# Patient Record
Sex: Male | Born: 1935
Health system: Southern US, Community
[De-identification: ages and names within clinical notes are randomized; demographics above are authoritative.]

## PROBLEM LIST (undated history)

## (undated) ENCOUNTER — Emergency Department (HOSPITAL_BASED_OUTPATIENT_CLINIC_OR_DEPARTMENT_OTHER): Disposition: A | Discharge status: Home / Self Care | Payer: Medicare Other

## (undated) DIAGNOSIS — R338 Other retention of urine: Secondary | ICD-10-CM

## (undated) DIAGNOSIS — H5440 Blindness, one eye, unspecified eye: Secondary | ICD-10-CM

## (undated) DIAGNOSIS — N401 Enlarged prostate with lower urinary tract symptoms: Secondary | ICD-10-CM

## (undated) DIAGNOSIS — N179 Acute kidney failure, unspecified: Secondary | ICD-10-CM

## (undated) DIAGNOSIS — I639 Cerebral infarction, unspecified: Secondary | ICD-10-CM

## (undated) DIAGNOSIS — E7800 Pure hypercholesterolemia, unspecified: Secondary | ICD-10-CM

## (undated) DIAGNOSIS — F329 Major depressive disorder, single episode, unspecified: Secondary | ICD-10-CM

## (undated) DIAGNOSIS — H3530 Unspecified macular degeneration: Secondary | ICD-10-CM

## (undated) DIAGNOSIS — IMO0001 Reserved for inherently not codable concepts without codable children: Secondary | ICD-10-CM

## (undated) DIAGNOSIS — F419 Anxiety disorder, unspecified: Secondary | ICD-10-CM

## (undated) DIAGNOSIS — I714 Abdominal aortic aneurysm, without rupture, unspecified: Secondary | ICD-10-CM

## (undated) DIAGNOSIS — F0390 Unspecified dementia without behavioral disturbance: Secondary | ICD-10-CM

## (undated) DIAGNOSIS — J449 Chronic obstructive pulmonary disease, unspecified: Secondary | ICD-10-CM

## (undated) DIAGNOSIS — N289 Disorder of kidney and ureter, unspecified: Secondary | ICD-10-CM

## (undated) DIAGNOSIS — F32A Depression, unspecified: Secondary | ICD-10-CM

## (undated) HISTORY — PX: NO PAST SURGERIES: SHX2092

---

## 2000-04-13 ENCOUNTER — Encounter: Admission: RE | Admit: 2000-04-13 | Discharge: 2000-04-13 | Payer: Self-pay | Admitting: Orthopedic Surgery

## 2010-06-15 ENCOUNTER — Ambulatory Visit: Payer: Self-pay | Admitting: Diagnostic Radiology

## 2010-06-15 ENCOUNTER — Emergency Department (HOSPITAL_BASED_OUTPATIENT_CLINIC_OR_DEPARTMENT_OTHER): Admission: EM | Admit: 2010-06-15 | Discharge: 2010-06-15 | Payer: Self-pay | Admitting: Emergency Medicine

## 2010-06-18 ENCOUNTER — Emergency Department (HOSPITAL_COMMUNITY): Admission: EM | Admit: 2010-06-18 | Discharge: 2010-06-18 | Payer: Self-pay | Admitting: Family Medicine

## 2012-04-26 ENCOUNTER — Encounter (HOSPITAL_BASED_OUTPATIENT_CLINIC_OR_DEPARTMENT_OTHER): Payer: Self-pay | Admitting: *Deleted

## 2012-04-26 ENCOUNTER — Emergency Department (HOSPITAL_BASED_OUTPATIENT_CLINIC_OR_DEPARTMENT_OTHER): Payer: Medicare Other

## 2012-04-26 ENCOUNTER — Emergency Department (HOSPITAL_BASED_OUTPATIENT_CLINIC_OR_DEPARTMENT_OTHER)
Admission: EM | Admit: 2012-04-26 | Discharge: 2012-04-26 | Disposition: A | Discharge status: Home / Self Care | Payer: Medicare Other | Attending: Emergency Medicine | Admitting: Emergency Medicine

## 2012-04-26 DIAGNOSIS — E7800 Pure hypercholesterolemia, unspecified: Secondary | ICD-10-CM | POA: Insufficient documentation

## 2012-04-26 DIAGNOSIS — F329 Major depressive disorder, single episode, unspecified: Secondary | ICD-10-CM | POA: Insufficient documentation

## 2012-04-26 DIAGNOSIS — R110 Nausea: Secondary | ICD-10-CM | POA: Insufficient documentation

## 2012-04-26 DIAGNOSIS — R51 Headache: Secondary | ICD-10-CM | POA: Insufficient documentation

## 2012-04-26 DIAGNOSIS — F17200 Nicotine dependence, unspecified, uncomplicated: Secondary | ICD-10-CM | POA: Insufficient documentation

## 2012-04-26 DIAGNOSIS — R339 Retention of urine, unspecified: Secondary | ICD-10-CM | POA: Insufficient documentation

## 2012-04-26 DIAGNOSIS — F411 Generalized anxiety disorder: Secondary | ICD-10-CM | POA: Insufficient documentation

## 2012-04-26 DIAGNOSIS — N390 Urinary tract infection, site not specified: Secondary | ICD-10-CM | POA: Insufficient documentation

## 2012-04-26 DIAGNOSIS — F3289 Other specified depressive episodes: Secondary | ICD-10-CM | POA: Insufficient documentation

## 2012-04-26 HISTORY — DX: Major depressive disorder, single episode, unspecified: F32.9

## 2012-04-26 HISTORY — DX: Anxiety disorder, unspecified: F41.9

## 2012-04-26 HISTORY — DX: Depression, unspecified: F32.A

## 2012-04-26 HISTORY — DX: Pure hypercholesterolemia, unspecified: E78.00

## 2012-04-26 LAB — COMPREHENSIVE METABOLIC PANEL
ALT: 14 U/L (ref 0–53)
Alkaline Phosphatase: 95 U/L (ref 39–117)
BUN: 11 mg/dL (ref 6–23)
CO2: 25 mEq/L (ref 19–32)
GFR calc Af Amer: 74 mL/min — ABNORMAL LOW (ref >90)
GFR calc non Af Amer: 64 mL/min — ABNORMAL LOW (ref >90)
Glucose, Bld: 128 mg/dL — ABNORMAL HIGH (ref 70–99)
Potassium: 4.2 mEq/L (ref 3.5–5.1)
Total Protein: 7.7 g/dL (ref 6.0–8.3)

## 2012-04-26 LAB — CBC WITH DIFFERENTIAL/PLATELET
Eosinophils Absolute: 0 10*3/uL (ref 0.0–0.7)
Eosinophils Relative: 0 % (ref 0–5)
Hemoglobin: 15.2 g/dL (ref 13.0–17.0)
Lymphocytes Relative: 8 % — ABNORMAL LOW (ref 12–46)
Lymphs Abs: 1.3 10*3/uL (ref 0.7–4.0)
MCH: 30.8 pg (ref 26.0–34.0)
MCV: 88 fL (ref 78.0–100.0)
Monocytes Relative: 5 % (ref 3–12)
Neutrophils Relative %: 87 % — ABNORMAL HIGH (ref 43–77)
RBC: 4.93 MIL/uL (ref 4.22–5.81)
WBC: 16.8 10*3/uL — ABNORMAL HIGH (ref 4.0–10.5)

## 2012-04-26 LAB — URINALYSIS, ROUTINE W REFLEX MICROSCOPIC
Bilirubin Urine: NEGATIVE
Ketones, ur: NEGATIVE mg/dL
Nitrite: NEGATIVE
Urobilinogen, UA: 0.2 mg/dL (ref 0.0–1.0)

## 2012-04-26 MED ORDER — DEXTROSE 5 % IV SOLN
1.0000 g | Freq: Once | INTRAVENOUS | Status: AC
  Administered 2012-04-26: 1 g via INTRAVENOUS
  Filled 2012-04-26: qty 10

## 2012-04-26 MED ORDER — SODIUM CHLORIDE 0.9 % IV BOLUS (SEPSIS)
1000.0000 mL | Freq: Once | INTRAVENOUS | Status: AC
  Administered 2012-04-26: 1000 mL via INTRAVENOUS

## 2012-04-26 MED ORDER — DEXAMETHASONE SODIUM PHOSPHATE 10 MG/ML IJ SOLN
10.0000 mg | Freq: Once | INTRAMUSCULAR | Status: AC
  Administered 2012-04-26: 10 mg via INTRAVENOUS
  Filled 2012-04-26: qty 1

## 2012-04-26 MED ORDER — DIPHENHYDRAMINE HCL 50 MG/ML IJ SOLN
25.0000 mg | Freq: Once | INTRAMUSCULAR | Status: AC
  Administered 2012-04-26: 25 mg via INTRAVENOUS
  Filled 2012-04-26: qty 1

## 2012-04-26 MED ORDER — METOCLOPRAMIDE HCL 5 MG/ML IJ SOLN
10.0000 mg | Freq: Once | INTRAMUSCULAR | Status: AC
  Administered 2012-04-26: 10 mg via INTRAVENOUS
  Filled 2012-04-26: qty 2

## 2012-04-26 MED ORDER — CEPHALEXIN 500 MG PO CAPS: 500.0000 mg | ORAL_CAPSULE | Freq: Four times a day (QID) | ORAL | Status: AC

## 2012-04-26 MED ORDER — CEPHALEXIN 500 MG PO CAPS: 500.0000 mg | ORAL_CAPSULE | Freq: Four times a day (QID) | ORAL | Status: DC

## 2012-04-26 NOTE — ED Notes (Signed)
Urinary retention since 4 am. States he is able to urinate scanty amounts but it takes a long time to start urinating. Headache. Nausea.

## 2012-04-26 NOTE — ED Provider Notes (Addendum)
History  This chart was scribed for Gwyneth Sprout, MD by Bennett Scrape. This patient was seen in room MH03/MH03 and the patient's care was started at 8:59PM.  CSN: [id number]  Arrival date & time 04/26/12  2002   First MD Initiated Contact with Patient 04/26/12 2059      Chief Complaint  Patient presents with  . Urinary Retention    The history is provided by the patient. No language interpreter was used.    David Duke is a 77 y.o. male who presents to the Emergency Department complaining of 17 hours of gradual onset, non-changing, constant urinary retention described as urinating scanty amounts after straining. He denies prior episodes of urinary retention. He also c/o approximately 15 hours of left-sided HA that radiates into his left neck with associated nausea. He reports that the nausea is worse with eating. He reports taking a 5 mg Valium with no improvement in his symptoms. He confirms prior episodes of HAs that start on the right-side and radiate into the right neck. 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  He denies fever, sore throat, visual disturbance, CP, SOB, abdominal pain, emesis, diarrhea, hematuria, dysuria, back pain, weakness, and rash as associated symptoms.  He also has a h/o depression and anxiety. He is a current everyday smoker but denies alcohol use.  Past Medical History  Diagnosis Date  . High cholesterol   . Depression   . Anxiety     History reviewed. No pertinent past surgical history.  No family history on file.  History  Substance Use Topics  . Smoking status: Current Everyday Smoker -- 0.5 packs/day  . Smokeless tobacco: Not on file  . Alcohol Use: No      Review of Systems  A complete 10 system review of systems was obtained and all systems are negative except as noted in the HPI and PMH.    Allergies    Review of patient's allergies indicates not on file.  Home Medications  No current outpatient prescriptions on file.  Triage Vitals: BP 153/87  Pulse 106  Temp 98.4 F (36.9 C) (Oral)  Resp 20  SpO2 98%  Physical Exam  Nursing note and vitals reviewed. Constitutional: He is oriented to person, place, and time. He appears well-developed and well-nourished. No distress.  HENT:  Head: Normocephalic and atraumatic.  Eyes: Conjunctivae and EOM are normal. Pupils are equal, round, and reactive to light.       No photophobia  Neck: Neck supple. No tracheal deviation present.  Cardiovascular: Normal rate and regular rhythm.   No murmur heard. Pulmonary/Chest: Effort normal and breath sounds normal. No respiratory distress.  Abdominal: Soft. Bowel sounds are normal. There is tenderness (mild suprapubic tenderness). There is no rebound and no guarding.       No CVA tenderness  Musculoskeletal: Normal range of motion. He exhibits no edema.  Neurological: He is alert and oriented to person, place, and time. No cranial nerve deficit.       Normal motor and sensation, normal strength  Skin: Skin is warm and dry.  Psychiatric: He has a normal mood and affect. His behavior is normal.    ED Course  Procedures (including critical care time)  DIAGNOSTIC STUDIES: Oxygen Saturation is 98% on room air, normal by my interpretation.    COORDINATION OF CARE: 9:12PM-Discussed treatment plan which  includes an US of the bladder and pain medications with pt at bedside and pt agreed to plan. Discussed a foley catheter but pt denies that he needs to urinate currently.    Labs Reviewed  URINALYSIS, ROUTINE W REFLEX MICROSCOPIC - Abnormal; Notable for the following:    APPearance CLOUDY (*)     Hgb urine dipstick LARGE (*)     Protein, ur 30 (*)     Leukocytes, UA MODERATE (*)     All other components within normal limits  CBC WITH DIFFERENTIAL - Abnormal; Notable for the following:    WBC 16.8  (*)     Neutrophils Relative 87 (*)     Neutro Abs 14.6 (*)     Lymphocytes Relative 8 (*)     All other components within normal limits  COMPREHENSIVE METABOLIC PANEL - Abnormal; Notable for the following:    Glucose, Bld 128 (*)     GFR calc non Af Amer 64 (*)     GFR calc Af Amer 74 (*)     All other components within normal limits  URINE MICROSCOPIC-ADD ON  URINE CULTURE   Ct Head Wo Contrast  04/26/2012  *RADIOLOGY REPORT*  Clinical Data: Severe headache  CT HEAD WITHOUT CONTRAST  Technique:  Contiguous axial images were obtained from the base of the skull through the vertex without contrast.  Comparison: None.  Findings: Generalized atrophy and mild chronic microvascular ischemia.  No acute infarct.  Negative for hemorrhage or mass. Calvarium is intact.  Paranasal sinuses are clear.  IMPRESSION: Atrophy and mild chronic microvascular ischemia.  No acute abnormality.   Original Report Authenticated By: Camelia Phenes, M.D.      1. UTI (lower urinary tract infection)   2. Urinary retention       MDM   Patient with symptoms consistent with urinary retention and concern for urinary tract infection. Patient has had generalized myalgias and not feeling well for the last 24 hours. Also after the urinary symptoms started he also developed a left-sided headache which is most consistent with migraine headache. Denies any vomiting or fevers. On exam he has suprapubic tenderness but no CVA tenderness. Has a normal neurologic exam without concerning for subarachnoid hemorrhage. CT of the head was negative. UA showed white blood cells and leukocytes consistent with infection in the urine culture was sent. Bedside ultrasound showed a large amount of urine retained within the bladder and a Foley catheter was placed. Patient has a leukocytosis of 16,000 but after headache cocktail and IV fluids she's feeling much better. He was given his first dose of antibiotics IV and was given Rocephin. He would  be sent home on Keflex and urine culture pending.  I personally performed the services described in this documentation, which was scribed in my presence.  The recorded information has been reviewed and considered.      Gwyneth Sprout, MD 04/26/12 2255  Gwyneth Sprout, MD 04/26/12 5409  Gwyneth Sprout, MD 04/26/12 8119

## 2012-04-27 LAB — URINE CULTURE: Colony Count: NO GROWTH

## 2015-03-31 ENCOUNTER — Emergency Department (HOSPITAL_BASED_OUTPATIENT_CLINIC_OR_DEPARTMENT_OTHER)
Admission: EM | Admit: 2015-03-31 | Discharge: 2015-03-31 | Disposition: A | Discharge status: Home / Self Care | Payer: Medicare Other | Attending: Emergency Medicine | Admitting: Emergency Medicine

## 2015-03-31 ENCOUNTER — Encounter (HOSPITAL_BASED_OUTPATIENT_CLINIC_OR_DEPARTMENT_OTHER): Payer: Self-pay | Admitting: Emergency Medicine

## 2015-03-31 DIAGNOSIS — Z8639 Personal history of other endocrine, nutritional and metabolic disease: Secondary | ICD-10-CM | POA: Diagnosis not present

## 2015-03-31 DIAGNOSIS — R339 Retention of urine, unspecified: Secondary | ICD-10-CM | POA: Diagnosis not present

## 2015-03-31 DIAGNOSIS — Z8659 Personal history of other mental and behavioral disorders: Secondary | ICD-10-CM | POA: Diagnosis not present

## 2015-03-31 DIAGNOSIS — R110 Nausea: Secondary | ICD-10-CM | POA: Diagnosis not present

## 2015-03-31 DIAGNOSIS — Z720 Tobacco use: Secondary | ICD-10-CM | POA: Diagnosis not present

## 2015-03-31 LAB — BASIC METABOLIC PANEL
Anion gap: 9 (ref 5–15)
BUN: 17 mg/dL (ref 6–20)
CHLORIDE: 103 mmol/L (ref 101–111)
CO2: 23 mmol/L (ref 22–32)
Calcium: 8.6 mg/dL — ABNORMAL LOW (ref 8.9–10.3)
Creatinine, Ser: 1.29 mg/dL — ABNORMAL HIGH (ref 0.61–1.24)
GFR calc non Af Amer: 51 mL/min — ABNORMAL LOW (ref >60)
GFR, EST AFRICAN AMERICAN: 60 mL/min — AB (ref >60)
GLUCOSE: 104 mg/dL — AB (ref 65–99)
POTASSIUM: 3.7 mmol/L (ref 3.5–5.1)
Sodium: 135 mmol/L (ref 135–145)

## 2015-03-31 LAB — URINALYSIS, ROUTINE W REFLEX MICROSCOPIC
BILIRUBIN URINE: NEGATIVE
GLUCOSE, UA: NEGATIVE mg/dL
Ketones, ur: NEGATIVE mg/dL
Leukocytes, UA: NEGATIVE
Nitrite: NEGATIVE
PH: 5.5 (ref 5.0–8.0)
Protein, ur: NEGATIVE mg/dL
SPECIFIC GRAVITY, URINE: 1.007 (ref 1.005–1.030)
Urobilinogen, UA: 0.2 mg/dL (ref 0.0–1.0)

## 2015-03-31 LAB — URINE MICROSCOPIC-ADD ON

## 2015-03-31 NOTE — ED Provider Notes (Signed)
CSN: [id number]     Arrival date & time 03/31/15  1747 History  This chart was scribed for Eber Hong, MD by Abel Presto, ED Scribe. This patient was seen in room MH07/MH07 and the patient's care was started at 7:34 PM.       Chief Complaint  Patient presents with  . Nausea  . Urinary Retention     HPI HPI Comments: David Duke is a 80 y.o. male with PMHx of HLD who presents to the Emergency Department complaining of urinary retention with onset this morning with some improvement by this evening. Wife reports "it's a small stream." He reports associated intermittent nausea for last several days. Pt tried drinking 3 bottles of water and cranberry juice with no improvement in symptoms. He reports some abdominal fullness but denies pain. Pt was placed on Levaquin 3 weeks ago for prostate enlargement and possible infection. Pt reports similar complaint in past in 2012. Pt takes Rapaflo. Pt denies fever and vomiting.  Past Medical History  Diagnosis Date  . High cholesterol   . Depression   . Anxiety    History reviewed. No pertinent past surgical history. History reviewed. No pertinent family history. History  Substance Use Topics  . Smoking status: Current Every Day Smoker -- 0.50 packs/day  . Smokeless tobacco: Not on file  . Alcohol Use: No    Review of Systems  Constitutional: Negative for fever.  Gastrointestinal: Positive for nausea. Negative for vomiting.  Genitourinary: Positive for decreased urine volume and difficulty urinating.  All other systems reviewed and are negative.     Allergies  Review of patient's allergies indicates no known allergies.  Home Medications   Prior to Admission medications   Not on File   BP 136/79 mmHg  Pulse 77  Temp(Src) 99 F (37.2 C) (Oral)  Resp 16  Ht  (1.727 m)  Wt 149 lb (67.586 kg)  BMI 22.66 kg/m2  SpO2 97% Physical Exam  Constitutional: He is oriented to person, place, and time. He appears  well-developed and well-nourished.  HENT:  Head: Normocephalic and atraumatic.  Eyes: Conjunctivae are normal. Right eye exhibits no discharge. Left eye exhibits no discharge.  Pulmonary/Chest: Effort normal. No respiratory distress.  Abdominal:  Fullness in suprapubic area.  Neurological: He is alert and oriented to person, place, and time. Coordination normal.  Skin: Skin is warm and dry. No rash noted. He is not diaphoretic. No erythema.  Psychiatric: He has a normal mood and affect.  Nursing note and vitals reviewed.   ED Course  Procedures (including critical care time) DIAGNOSTIC STUDIES: Oxygen Saturation is 100% on room air, normal by my interpretation.    COORDINATION OF CARE: 7:41 PM Discussed treatment plan with patient at beside, the patient agrees with the plan and has no further questions at this time.   Labs Review Labs Reviewed  URINALYSIS, ROUTINE W REFLEX MICROSCOPIC (NOT AT [facility]) - Abnormal; Notable for the following:    Hgb urine dipstick TRACE (*)    All other components within normal limits  BASIC METABOLIC PANEL - Abnormal; Notable for the following:    Glucose, Bld 104 (*)    Creatinine, Ser 1.29 (*)    Calcium 8.6 (*)    GFR calc non Af Amer 51 (*)    GFR calc Af Amer 60 (*)    All other components within normal limits  URINE MICROSCOPIC-ADD ON    Imaging Review No results found.  MDM   Final diagnoses:  Urinary retention     The patient has no urinary tract infection, vital signs are normal, he has been able to pass urine spontaneously, no need for Foley placement. He now states that he has been using Alka-Seltzer plus which contains several different medications which could cause urinary retention. At this time the risk of placing a Foley and causing infection is greater than the benefit. He will be discharged home to follow up with urology and instructed not to use that medication anymore.  I personally performed the services described in  this documentation, which was scribed in my presence. The recorded information has been reviewed and is accurate.       Eber Hong, MD 03/31/15 2114

## 2015-03-31 NOTE — ED Notes (Signed)
Patient states that he is having some urinary retention, reports that he has not had a good void since this am since 7 am. The patient reports that since then he has had little dribbles of urine. He reports no "pain" but reports that he feels tired and is nauseated.

## 2015-11-07 DIAGNOSIS — I639 Cerebral infarction, unspecified: Secondary | ICD-10-CM

## 2015-11-07 HISTORY — DX: Cerebral infarction, unspecified: I63.9

## 2015-11-22 ENCOUNTER — Inpatient Hospital Stay (HOSPITAL_COMMUNITY)
Admission: EM | Admit: 2015-11-22 | Discharge: 2015-11-28 | DRG: 065 | Disposition: A | Discharge status: Rehabilitation Facility | Payer: Medicare Other | Attending: Neurology | Admitting: Neurology

## 2015-11-22 ENCOUNTER — Encounter (HOSPITAL_COMMUNITY): Payer: Self-pay | Admitting: Emergency Medicine

## 2015-11-22 ENCOUNTER — Emergency Department (HOSPITAL_COMMUNITY): Payer: Medicare Other

## 2015-11-22 DIAGNOSIS — J449 Chronic obstructive pulmonary disease, unspecified: Secondary | ICD-10-CM | POA: Diagnosis present

## 2015-11-22 DIAGNOSIS — H53461 Homonymous bilateral field defects, right side: Secondary | ICD-10-CM | POA: Diagnosis present

## 2015-11-22 DIAGNOSIS — H3530 Unspecified macular degeneration: Secondary | ICD-10-CM | POA: Diagnosis present

## 2015-11-22 DIAGNOSIS — J329 Chronic sinusitis, unspecified: Secondary | ICD-10-CM | POA: Diagnosis not present

## 2015-11-22 DIAGNOSIS — I6789 Other cerebrovascular disease: Secondary | ICD-10-CM | POA: Diagnosis not present

## 2015-11-22 DIAGNOSIS — H5441 Blindness, right eye, normal vision left eye: Secondary | ICD-10-CM | POA: Diagnosis present

## 2015-11-22 DIAGNOSIS — F4323 Adjustment disorder with mixed anxiety and depressed mood: Secondary | ICD-10-CM | POA: Diagnosis not present

## 2015-11-22 DIAGNOSIS — F17210 Nicotine dependence, cigarettes, uncomplicated: Secondary | ICD-10-CM | POA: Diagnosis present

## 2015-11-22 DIAGNOSIS — N179 Acute kidney failure, unspecified: Secondary | ICD-10-CM | POA: Diagnosis present

## 2015-11-22 DIAGNOSIS — H538 Other visual disturbances: Secondary | ICD-10-CM | POA: Diagnosis present

## 2015-11-22 DIAGNOSIS — D649 Anemia, unspecified: Secondary | ICD-10-CM | POA: Insufficient documentation

## 2015-11-22 DIAGNOSIS — E785 Hyperlipidemia, unspecified: Secondary | ICD-10-CM | POA: Diagnosis present

## 2015-11-22 DIAGNOSIS — J0110 Acute frontal sinusitis, unspecified: Secondary | ICD-10-CM | POA: Diagnosis not present

## 2015-11-22 DIAGNOSIS — G9340 Encephalopathy, unspecified: Secondary | ICD-10-CM | POA: Diagnosis present

## 2015-11-22 DIAGNOSIS — R4701 Aphasia: Secondary | ICD-10-CM

## 2015-11-22 DIAGNOSIS — E871 Hypo-osmolality and hyponatremia: Secondary | ICD-10-CM | POA: Diagnosis present

## 2015-11-22 DIAGNOSIS — R7303 Prediabetes: Secondary | ICD-10-CM | POA: Diagnosis present

## 2015-11-22 DIAGNOSIS — E876 Hypokalemia: Secondary | ICD-10-CM | POA: Diagnosis not present

## 2015-11-22 DIAGNOSIS — E860 Dehydration: Secondary | ICD-10-CM | POA: Diagnosis present

## 2015-11-22 DIAGNOSIS — F419 Anxiety disorder, unspecified: Secondary | ICD-10-CM | POA: Diagnosis present

## 2015-11-22 DIAGNOSIS — F40240 Claustrophobia: Secondary | ICD-10-CM | POA: Diagnosis present

## 2015-11-22 DIAGNOSIS — I69919 Unspecified symptoms and signs involving cognitive functions following unspecified cerebrovascular disease: Secondary | ICD-10-CM | POA: Diagnosis not present

## 2015-11-22 DIAGNOSIS — F329 Major depressive disorder, single episode, unspecified: Secondary | ICD-10-CM | POA: Diagnosis present

## 2015-11-22 DIAGNOSIS — I639 Cerebral infarction, unspecified: Secondary | ICD-10-CM | POA: Diagnosis not present

## 2015-11-22 DIAGNOSIS — I10 Essential (primary) hypertension: Secondary | ICD-10-CM | POA: Diagnosis present

## 2015-11-22 DIAGNOSIS — I611 Nontraumatic intracerebral hemorrhage in hemisphere, cortical: Secondary | ICD-10-CM | POA: Diagnosis present

## 2015-11-22 DIAGNOSIS — I6529 Occlusion and stenosis of unspecified carotid artery: Secondary | ICD-10-CM | POA: Diagnosis present

## 2015-11-22 DIAGNOSIS — I610 Nontraumatic intracerebral hemorrhage in hemisphere, subcortical: Secondary | ICD-10-CM | POA: Diagnosis not present

## 2015-11-22 DIAGNOSIS — I629 Nontraumatic intracranial hemorrhage, unspecified: Secondary | ICD-10-CM

## 2015-11-22 DIAGNOSIS — I619 Nontraumatic intracerebral hemorrhage, unspecified: Secondary | ICD-10-CM

## 2015-11-22 DIAGNOSIS — R339 Retention of urine, unspecified: Secondary | ICD-10-CM | POA: Diagnosis not present

## 2015-11-22 DIAGNOSIS — R062 Wheezing: Secondary | ICD-10-CM

## 2015-11-22 DIAGNOSIS — R509 Fever, unspecified: Secondary | ICD-10-CM

## 2015-11-22 DIAGNOSIS — I69398 Other sequelae of cerebral infarction: Secondary | ICD-10-CM | POA: Diagnosis not present

## 2015-11-22 DIAGNOSIS — I69993 Ataxia following unspecified cerebrovascular disease: Secondary | ICD-10-CM | POA: Diagnosis not present

## 2015-11-22 DIAGNOSIS — N400 Enlarged prostate without lower urinary tract symptoms: Secondary | ICD-10-CM | POA: Diagnosis not present

## 2015-11-22 DIAGNOSIS — F32A Depression, unspecified: Secondary | ICD-10-CM | POA: Diagnosis present

## 2015-11-22 DIAGNOSIS — D72829 Elevated white blood cell count, unspecified: Secondary | ICD-10-CM

## 2015-11-22 DIAGNOSIS — I674 Hypertensive encephalopathy: Secondary | ICD-10-CM | POA: Diagnosis present

## 2015-11-22 HISTORY — DX: Unspecified macular degeneration: H35.30

## 2015-11-22 HISTORY — DX: Chronic obstructive pulmonary disease, unspecified: J44.9

## 2015-11-22 HISTORY — DX: Blindness, one eye, unspecified eye: H54.40

## 2015-11-22 HISTORY — DX: Reserved for inherently not codable concepts without codable children: IMO0001

## 2015-11-22 LAB — LIPID PANEL
Cholesterol: 213 mg/dL — ABNORMAL HIGH (ref 0–200)
HDL: 53 mg/dL (ref >40)
LDL CALC: 122 mg/dL — AB (ref 0–99)
TRIGLYCERIDES: 188 mg/dL — AB (ref <150)
Total CHOL/HDL Ratio: 4 RATIO
VLDL: 38 mg/dL (ref 0–40)

## 2015-11-22 LAB — I-STAT CHEM 8, ED
BUN: 13 mg/dL (ref 6–20)
Calcium, Ion: 1.07 mmol/L — ABNORMAL LOW (ref 1.13–1.30)
Chloride: 102 mmol/L (ref 101–111)
Creatinine, Ser: 1.3 mg/dL — ABNORMAL HIGH (ref 0.61–1.24)
Glucose, Bld: 111 mg/dL — ABNORMAL HIGH (ref 65–99)
HEMATOCRIT: 50 % (ref 39.0–52.0)
Hemoglobin: 17 g/dL (ref 13.0–17.0)
Potassium: 4.2 mmol/L (ref 3.5–5.1)
SODIUM: 137 mmol/L (ref 135–145)
TCO2: 24 mmol/L (ref 0–100)

## 2015-11-22 LAB — DIFFERENTIAL
Basophils Absolute: 0 10*3/uL (ref 0.0–0.1)
Basophils Relative: 0 %
EOS ABS: 0.1 10*3/uL (ref 0.0–0.7)
Eosinophils Relative: 1 %
Lymphocytes Relative: 6 %
Lymphs Abs: 0.8 10*3/uL (ref 0.7–4.0)
MONO ABS: 0.6 10*3/uL (ref 0.1–1.0)
Monocytes Relative: 5 %
NEUTROS PCT: 88 %
Neutro Abs: 11.3 10*3/uL — ABNORMAL HIGH (ref 1.7–7.7)
Smear Review: ADEQUATE

## 2015-11-22 LAB — RAPID URINE DRUG SCREEN, HOSP PERFORMED
Amphetamines: NOT DETECTED
Barbiturates: NOT DETECTED
Benzodiazepines: POSITIVE — AB
COCAINE: NOT DETECTED
OPIATES: NOT DETECTED
TETRAHYDROCANNABINOL: NOT DETECTED

## 2015-11-22 LAB — URINALYSIS, ROUTINE W REFLEX MICROSCOPIC
Bilirubin Urine: NEGATIVE
GLUCOSE, UA: NEGATIVE mg/dL
KETONES UR: NEGATIVE mg/dL
LEUKOCYTES UA: NEGATIVE
Nitrite: NEGATIVE
PH: 6 (ref 5.0–8.0)
Protein, ur: NEGATIVE mg/dL
Specific Gravity, Urine: 1.012 (ref 1.005–1.030)

## 2015-11-22 LAB — CBC
HCT: 47.1 % (ref 39.0–52.0)
Hemoglobin: 15.5 g/dL (ref 13.0–17.0)
MCH: 29.9 pg (ref 26.0–34.0)
MCHC: 32.9 g/dL (ref 30.0–36.0)
MCV: 90.8 fL (ref 78.0–100.0)
PLATELETS: ADEQUATE 10*3/uL (ref 150–400)
RBC: 5.19 MIL/uL (ref 4.22–5.81)
RDW: 13.2 % (ref 11.5–15.5)
WBC: 12.8 10*3/uL — ABNORMAL HIGH (ref 4.0–10.5)

## 2015-11-22 LAB — COMPREHENSIVE METABOLIC PANEL
ALBUMIN: 3.9 g/dL (ref 3.5–5.0)
ALT: 15 U/L — ABNORMAL LOW (ref 17–63)
ANION GAP: 12 (ref 5–15)
AST: 22 U/L (ref 15–41)
Alkaline Phosphatase: 90 U/L (ref 38–126)
BUN: 11 mg/dL (ref 6–20)
CO2: 21 mmol/L — ABNORMAL LOW (ref 22–32)
Calcium: 9.1 mg/dL (ref 8.9–10.3)
Chloride: 103 mmol/L (ref 101–111)
Creatinine, Ser: 1.33 mg/dL — ABNORMAL HIGH (ref 0.61–1.24)
GFR calc Af Amer: 57 mL/min — ABNORMAL LOW (ref >60)
GFR calc non Af Amer: 50 mL/min — ABNORMAL LOW (ref >60)
GLUCOSE: 118 mg/dL — AB (ref 65–99)
POTASSIUM: 4.3 mmol/L (ref 3.5–5.1)
SODIUM: 136 mmol/L (ref 135–145)
TOTAL PROTEIN: 7.1 g/dL (ref 6.5–8.1)
Total Bilirubin: 0.5 mg/dL (ref 0.3–1.2)

## 2015-11-22 LAB — PROTIME-INR
INR: 0.98 (ref 0.00–1.49)
PROTHROMBIN TIME: 13.2 s (ref 11.6–15.2)

## 2015-11-22 LAB — I-STAT TROPONIN, ED: Troponin i, poc: 0.01 ng/mL (ref 0.00–0.08)

## 2015-11-22 LAB — GLUCOSE, CAPILLARY: Glucose-Capillary: 117 mg/dL — ABNORMAL HIGH (ref 65–99)

## 2015-11-22 LAB — ETHANOL

## 2015-11-22 LAB — APTT: aPTT: 26 seconds (ref 24–37)

## 2015-11-22 LAB — URINE MICROSCOPIC-ADD ON

## 2015-11-22 MED ORDER — ACETAMINOPHEN 325 MG PO TABS
650.0000 mg | ORAL_TABLET | ORAL | Status: DC | PRN
  Administered 2015-11-22 – 2015-11-23 (×4): 650 mg via ORAL
  Filled 2015-11-22 (×4): qty 2

## 2015-11-22 MED ORDER — STROKE: EARLY STAGES OF RECOVERY BOOK
Freq: Once | Status: DC
  Filled 2015-11-22: qty 1

## 2015-11-22 MED ORDER — LABETALOL HCL 5 MG/ML IV SOLN
10.0000 mg | INTRAVENOUS | Status: DC | PRN
  Administered 2015-11-22 – 2015-11-25 (×7): 10 mg via INTRAVENOUS
  Filled 2015-11-22 (×5): qty 4

## 2015-11-22 MED ORDER — ACETAMINOPHEN 650 MG RE SUPP: 650.0000 mg | RECTAL | Status: DC | PRN

## 2015-11-22 MED ORDER — NICARDIPINE HCL IN NACL 20-0.86 MG/200ML-% IV SOLN: 3.0000 mg/h | Freq: Once | INTRAVENOUS | Status: DC

## 2015-11-22 MED ORDER — SENNOSIDES-DOCUSATE SODIUM 8.6-50 MG PO TABS
1.0000 | ORAL_TABLET | Freq: Two times a day (BID) | ORAL | Status: DC
Start: 2015-11-22 — End: 2015-11-28
  Administered 2015-11-24 – 2015-11-28 (×6): 1 via ORAL
  Filled 2015-11-22 (×9): qty 1

## 2015-11-22 MED ORDER — PANTOPRAZOLE SODIUM 40 MG IV SOLR
40.0000 mg | Freq: Every day | INTRAVENOUS | Status: DC
  Administered 2015-11-22: 40 mg via INTRAVENOUS
  Filled 2015-11-22: qty 40

## 2015-11-22 NOTE — ED Provider Notes (Signed)
CSN: [id number]     Arrival date & time 11/22/15  1717 History   First MD Initiated Contact with Patient 11/22/15 1728     Chief Complaint  Patient presents with  . Code Stroke      HPI  81 y.o. 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 Patient reports that earlier this afternoon he developed some blurry vision in his left eye. He denies any weakness or speech disturbances, numbness, protruding posterior or gait problems. He went to his ophthalmologist's abdominal office to get an exam and was concerned for stroke, so sent the patient here for evaluation. Patient's wife additionally reports that the patient seemed confused earlier in the day today. He denies any recent illnesses including fever, headache, chest pain, shortness of breath, abdominal pain, nausea, vomiting, diarrhea, urinary frequency. No prior history of stroke. He is not on blood thinners. No recent head trauma.   Early this afternoon developed blurry vision in the L eye Past Medical History  Diagnosis Date  . High cholesterol   . Depression   . Anxiety    History reviewed. No pertinent past surgical history. No family history on file. Social History  Substance Use Topics  . Smoking status: Current Every Day Smoker -- 0.50 packs/day  . Smokeless tobacco: None  . Alcohol Use: No    Review of Systems  Constitutional: Negative for fever, chills, activity change and appetite change.  HENT: Negative for congestion, facial swelling, rhinorrhea and sore throat.   Eyes: Positive for visual disturbance.  Respiratory: Negative for cough, shortness of breath and wheezing.   Cardiovascular: Negative for chest pain, palpitations and leg swelling.  Gastrointestinal: Negative for nausea, vomiting, abdominal pain, diarrhea, constipation and blood in stool.  Genitourinary: Negative for dysuria, frequency, hematuria, flank  pain and difficulty urinating.  Musculoskeletal: Negative for myalgias, back pain, joint swelling, arthralgias, neck pain and neck stiffness.  Skin: Negative for rash.  Neurological: Negative for dizziness, tremors, seizures, syncope, facial asymmetry, speech difficulty, weakness, light-headedness, numbness and headaches.  Psychiatric/Behavioral: Negative for behavioral problems, confusion and agitation.      Allergies  Review of patient's allergies indicates no known allergies.  Home Medications   Prior to Admission medications   Not on File   BP 145/98 mmHg  Pulse 87  Temp(Src) 99 F (37.2 C) (Oral)  Resp 16  SpO2 95% Physical Exam  Constitutional: He is oriented to person, place, and time. He appears well-developed and well-nourished. No distress.  HENT:  Head: Normocephalic and atraumatic.  Right Ear: External ear normal.  Left Ear: External ear normal.  Nose: Nose normal.  Mouth/Throat: Oropharynx is clear and moist. No oropharyngeal exudate.  Eyes: Conjunctivae are normal. Pupils are equal, round, and reactive to light. Right eye exhibits no discharge. Left eye exhibits no discharge. No scleral icterus.  R hemianopsia  Neck: Normal range of motion. Neck supple. No tracheal deviation present.  Cardiovascular: Normal rate, regular rhythm and normal heart sounds.  Exam reveals no gallop and no friction rub.   No murmur heard. Pulmonary/Chest: Effort normal and breath sounds normal. No respiratory distress. He has no wheezes. He has no rales.  Abdominal: Soft. Bowel sounds are normal. He exhibits no distension and no mass. There is no tenderness. There is no rebound and no guarding.  Musculoskeletal: Normal range of motion. He exhibits no edema or tenderness.  Neurological: He is alert and oriented to  person, place, and time. He exhibits normal muscle tone.  Face symmetric, tongue midline. 5/5 strength in the proximal and distal upper and lower extremities bilaterally, with  intact sensation to light touch. Normal finger to nose, and rapid alternating movements. Normal speech.    Skin: Skin is warm and dry. No rash noted. He is not diaphoretic.  Psychiatric: He has a normal mood and affect. His behavior is normal. Judgment and thought content normal.    ED Course  Procedures (including critical care time) Labs Review Labs Reviewed  CBC - Abnormal; Notable for the following:    WBC 12.8 (*)    All other components within normal limits  DIFFERENTIAL - Abnormal; Notable for the following:    Neutro Abs 11.3 (*)    All other components within normal limits  COMPREHENSIVE METABOLIC PANEL - Abnormal; Notable for the following:    CO2 21 (*)    Glucose, Bld 118 (*)    Creatinine, Ser 1.33 (*)    ALT 15 (*)    GFR calc non Af Amer 50 (*)    GFR calc Af Amer 57 (*)    All other components within normal limits  I-STAT CHEM 8, ED - Abnormal; Notable for the following:    Creatinine, Ser 1.30 (*)    Glucose, Bld 111 (*)    Calcium, Ion 1.07 (*)    All other components within normal limits  PROTIME-INR  APTT  ETHANOL  URINE RAPID DRUG SCREEN, HOSP PERFORMED  URINALYSIS, ROUTINE W REFLEX MICROSCOPIC (NOT AT [facility])  LIPID PANEL  I-STAT TROPOININ, ED    Imaging Review Ct Head Wo Contrast  11/22/2015  CLINICAL DATA:  Stroke, vision changes on the left and neck pain. EXAM: CT HEAD WITHOUT CONTRAST TECHNIQUE: Contiguous axial images were obtained from the base of the skull through the vertex without intravenous contrast. COMPARISON:  Head CT dated 04/26/2012. FINDINGS: Brain: Acute hemorrhage within the left lower parietal-occipital lobe, measuring 5.6 x 2.9 cm, with surrounding edema causing effacement of adjacent sulci and mass effect on the posterior horn of the left lateral ventricle. Hemorrhage extends into the posterior horn of the left lateral ventricle. Small amount of additional subdural hemorrhage is seen along the underlying tentorium and along the  adjacent posterior falx. No midline shift or herniation. Mild chronic small vessel ischemic change noted within the deep periventricular white matter regions bilaterally. No hydrocephalus. Vascular: No hyperdense vessel or unexpected calcification. Skull: Negative for fracture or focal lesion. Sinuses/Orbits: No acute findings. Other: None. IMPRESSION: 1. Acute intraparenchymal hemorrhage within the lower left parietal-occipital lobe, measuring 5.6 x 2.9 cm, with surrounding edema causing slight mass effect without midline shift or herniation. Hemorrhage extends into the adjacent posterior horn of the left lateral ventricle. Additional thin hemorrhage along the adjacent posterior falx and underlying tentorium. 2. Mild chronic small vessel ischemic change within the periventricular white matter. Critical Value/emergent results were called by telephone at the time of interpretation on 11/22/2015 at 5:36 pm to Dr. Azalia BilisKEVIN CAMPOS , who verbally acknowledged these results. Electronically Signed   By: Bary RichardStan  Maynard M.D.   On: 11/22/2015 17:36   I have personally reviewed and evaluated these images and lab results as part of my medical decision-making.   EKG Interpretation None      MDM   Final diagnoses:  Left-sided nontraumatic intracerebral hemorrhage, unspecified cerebral location [facility](HCC)    Code stroke activated upon patient arrival as patient was last seen normal at 11:30 AM. Airway intact  and patient awake and alert and moving all 4 extremities.  CT head obtained showing a large left parieto-occipital hemorrhage. Neurology at bedside. Will initiate a Cardene drip to maintain blood pressure between 110-140 systolic. Remainder of neurologic exam as above. Plan to admit to the neurology ICU for further management. Patient is not on any blood thinners. He stable for transfer to the ICU.  Jenifer Ernestina Penna, MD 11/22/15 1817  Azalia Bilis, MD 11/22/15 [phone]

## 2015-11-22 NOTE — ED Notes (Signed)
Patient comes from MD office. Per family about 1130 patient started having acute vision changes. Patient wife also had some mild confusion. Patient alert and oriented x4 on arrival. Patient able to move all extremities. Per family patient speech normal.

## 2015-11-22 NOTE — H&P (Signed)
Requesting Physician: Dr.  Patria Mane    Reason for consultation: Stroke code  HPI:                                                                                                                                         ANUBIS FUNDORA is an 81 y.o. 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 Patient reports that earlier this afternoon he developed some blurry vision in his left eye. He denies any weakness or speech disturbances, numbness, protruding posterior or gait problems. He went to his ophthalmologist's abdominal office to get an exam and was concerned for stroke, so sent the patient here for evaluation. Patient's wife additionally reports that the patient seemed confused earlier in the day today. He denies any recent illnesses including fever, headache, chest pain, shortness of breath, abdominal pain, nausea, vomiting, diarrhea, urinary frequency. No prior history of stroke. He is not on blood thinners. No recent head trauma.    Date last known well:  11/22/2015 Time last known well:  Some time this morning, time of onset unknown tPA Given: No: Intracerebral hemorrhage ICH score: 1 Stroke Risk Factors - hypertension  Past Medical History: Past Medical History  Diagnosis Date  . High cholesterol   . Depression   . Anxiety     History reviewed. No pertinent past surgical history.  Family History: No family history on file.  Social History:   reports that he has been smoking.  He does not have any smokeless tobacco history on file. He reports that he does not drink alcohol or use illicit drugs.  Allergies:  No Known Allergies   Medications:                                                                                                                         Current facility-administered medications:  .   stroke: mapping our early stages of recovery book, ,  Does not apply, Once, Paras Kreider Daniel Nones, MD .  acetaminophen (TYLENOL) tablet 650 mg, 650 mg, Oral, Q4H PRN **OR** acetaminophen (TYLENOL) suppository 650 mg, 650 mg, Rectal, Q4H PRN, Marinna Blane Daniel Nones, MD .  labetalol (NORMODYNE,TRANDATE) injection 10 mg, 10 mg, Intravenous, Q10 min PRN, Jamesa Tedrick Daniel Nones, MD, 10 mg at 11/22/15 1746 .  nicardipine (  CARDENE)  in 0.86% saline IV infusion (0.1 mg/ml), 3-15 mg/hr, Intravenous, Once, Jenifer Brunno Irick, MD .  pantoprazole (PROTONIX) injection 40 mg, 40 mg, Intravenous, QHS, Ellesse Antenucci Daniel Nones, MD .  senna-docusate (Senokot-S) tablet 1 tablet, 1 tablet, Oral, BID, Litzy Dicker Daniel Nones, MD No current outpatient prescriptions on file.   ROS:                                                                                                                                       History obtained from the patient  General ROS: negative for - chills, fatigue, fever, night sweats, weight gain or weight loss Psychological ROS: negative for - behavioral disorder, hallucinations, memory difficulties, mood swings or suicidal ideation Ophthalmic ROS: negative for - blurry vision, double vision, eye pain or loss of vision ENT ROS: negative for - epistaxis, nasal discharge, oral lesions, sore throat, tinnitus or vertigo Allergy and Immunology ROS: negative for - hives or itchy/watery eyes Hematological and Lymphatic ROS: negative for - bleeding problems, bruising or swollen lymph nodes Endocrine ROS: negative for - galactorrhea, hair pattern changes, polydipsia/polyuria or temperature intolerance Respiratory ROS: negative for - cough, hemoptysis, shortness of breath or wheezing Cardiovascular ROS: negative for - chest pain, dyspnea on exertion, edema or irregular heartbeat Gastrointestinal ROS: negative for - abdominal pain, diarrhea, hematemesis, nausea/vomiting or stool incontinence Genito-Urinary ROS:  negative for - dysuria, hematuria, incontinence or urinary frequency/urgency Musculoskeletal ROS: negative for - joint swelling or muscular weakness Neurological ROS: as noted in HPI Dermatological ROS: negative for rash and skin lesion changes  Neurologic Examination:                                                                                                    Today's Vitals   11/22/15 1801 11/22/15 1803  BP: 145/98   Pulse: 87   Temp: 99 F (37.2 C)   TempSrc: Oral   Resp: 16   SpO2: 95%   PainSc:  8     Evaluation of higher integrative functions including: Level of alertness: Alert,  Oriented to time, place and person speech: fluent, no evidence of dysarthria or aphasia noted.  Test the following cranial nerves: 3-12 grossly intact, he has chronic vision loss in the right eye, right hemianopia through the left eye.  Motor examination: Normal tone, bulk, full 5/5 motor strength in all 4 extremities Examination of sensation : Normal and symmetric sensation to pinprick in all 4 extremities and on face Examination  of deep tendon reflexes: 2+, normal and symmetric in all extremities, normal plantars bilaterally Test coordination: Normal finger nose testing, with no evidence of limb appendicular ataxia or abnormal involuntary movements or tremors noted.       Lab Results: Basic Metabolic Panel:  Recent Labs Lab 11/22/15 1720 11/22/15 1732  NA 136 137  K 4.3 4.2  CL 103 102  CO2 21*  --   GLUCOSE 118* 111*  BUN 11 13  CREATININE 1.33* 1.30*  CALCIUM 9.1  --     Liver Function Tests:  Recent Labs Lab 11/22/15 1720  AST 22  ALT 15*  ALKPHOS 90  BILITOT 0.5  PROT 7.1  ALBUMIN 3.9   No results for input(s): LIPASE, AMYLASE in the last 168 hours. No results for input(s): AMMONIA in the last 168 hours.  CBC:  Recent Labs Lab 11/22/15 1720 11/22/15 1732  WBC PENDING  --   NEUTROABS PENDING  --   HGB 15.5 17.0  HCT 47.1 50.0  MCV 90.8  --   PLT  PENDING  --     Cardiac Enzymes: No results for input(s): CKTOTAL, CKMB, CKMBINDEX, TROPONINI in the last 168 hours.  Lipid Panel: No results for input(s): CHOL, TRIG, HDL, CHOLHDL, VLDL, LDLCALC in the last 168 hours.  CBG: No results for input(s): GLUCAP in the last 168 hours.  Microbiology: Results for orders placed or performed during the hospital encounter of 04/26/12  Urine culture     Status: None   Collection Time: 04/26/12  8:56 PM  Result Value Ref Range Status   Specimen Description URINE, CLEAN CATCH  Final   Special Requests NONE  Final   Culture  Setup Time 04/27/2012 01:31  Final   Colony Count NO GROWTH  Final   Culture NO GROWTH  Final   Report Status 04/27/2012 FINAL  Final     Imaging: Ct Head Wo Contrast  11/22/2015  CLINICAL DATA:  Stroke, vision changes on the left and neck pain. EXAM: CT HEAD WITHOUT CONTRAST TECHNIQUE: Contiguous axial images were obtained from the base of the skull through the vertex without intravenous contrast. COMPARISON:  Head CT dated 04/26/2012. FINDINGS: Brain: Acute hemorrhage within the left lower parietal-occipital lobe, measuring 5.6 x 2.9 cm, with surrounding edema causing effacement of adjacent sulci and mass effect on the posterior horn of the left lateral ventricle. Hemorrhage extends into the posterior horn of the left lateral ventricle. Small amount of additional subdural hemorrhage is seen along the underlying tentorium and along the adjacent posterior falx. No midline shift or herniation. Mild chronic small vessel ischemic change noted within the deep periventricular white matter regions bilaterally. No hydrocephalus. Vascular: No hyperdense vessel or unexpected calcification. Skull: Negative for fracture or focal lesion. Sinuses/Orbits: No acute findings. Other: None. IMPRESSION: 1. Acute intraparenchymal hemorrhage within the lower left parietal-occipital lobe, measuring 5.6 x 2.9 cm, with surrounding edema causing slight mass  effect without midline shift or herniation. Hemorrhage extends into the adjacent posterior horn of the left lateral ventricle. Additional thin hemorrhage along the adjacent posterior falx and underlying tentorium. 2. Mild chronic small vessel ischemic change within the periventricular white matter. Critical Value/emergent results were called by telephone at the time of interpretation on 11/22/2015 at 5:36 pm to Dr. Azalia Bilis , who verbally acknowledged these results. Electronically Signed   By: Bary Richard M.D.   On: 11/22/2015 17:36      Assessment and plan:   MAKOA SATZ is an 81 y.o.  male patient who presented from his ophthalmologist office to be evaluated evaluated for possible stroke. Patient himself is a poor historian. He has a chronic vision loss in the right eye and the had noted some worsening difficulty seeing on his right side of the vision, which appears to be a new onset of her right hemianopia noted through the left eye. CT of the head showed a left parieto-occipital intraparenchymal hemorrhage which based on his location can cause significant right hemianopia. Does not take aspirin or any anticoagulants at home.  His blood pressure in the ER was elevated up to 160s controlled with when necessary labetalol doses to a goal systolic blood pressure between 110-140. Ordered nicardipine drip to maintain a goal systolic blood pressure.  Reviewed CT images with patient and his wife explained the findings. He'll be admitted for close neurological monitoring to stepdown unit.   Stroke team will continue to follow starting tomorrow morning.

## 2015-11-23 ENCOUNTER — Ambulatory Visit (HOSPITAL_COMMUNITY): Payer: Medicare Other

## 2015-11-23 ENCOUNTER — Inpatient Hospital Stay (HOSPITAL_COMMUNITY): Payer: Medicare Other

## 2015-11-23 LAB — BASIC METABOLIC PANEL
Anion gap: 12 (ref 5–15)
BUN: 10 mg/dL (ref 6–20)
CHLORIDE: 106 mmol/L (ref 101–111)
CO2: 21 mmol/L — ABNORMAL LOW (ref 22–32)
CREATININE: 1.37 mg/dL — AB (ref 0.61–1.24)
Calcium: 9.1 mg/dL (ref 8.9–10.3)
GFR calc Af Amer: 55 mL/min — ABNORMAL LOW (ref >60)
GFR, EST NON AFRICAN AMERICAN: 48 mL/min — AB (ref >60)
Glucose, Bld: 103 mg/dL — ABNORMAL HIGH (ref 65–99)
Potassium: 4.1 mmol/L (ref 3.5–5.1)
SODIUM: 139 mmol/L (ref 135–145)

## 2015-11-23 LAB — CBC
HCT: 44.1 % (ref 39.0–52.0)
Hemoglobin: 14.6 g/dL (ref 13.0–17.0)
MCH: 29.8 pg (ref 26.0–34.0)
MCHC: 33.1 g/dL (ref 30.0–36.0)
MCV: 90 fL (ref 78.0–100.0)
PLATELETS: 197 10*3/uL (ref 150–400)
RBC: 4.9 MIL/uL (ref 4.22–5.81)
RDW: 13.2 % (ref 11.5–15.5)
WBC: 10.6 10*3/uL — ABNORMAL HIGH (ref 4.0–10.5)

## 2015-11-23 LAB — GLUCOSE, CAPILLARY
Glucose-Capillary: 114 mg/dL — ABNORMAL HIGH (ref 65–99)
Glucose-Capillary: 141 mg/dL — ABNORMAL HIGH (ref 65–99)
Glucose-Capillary: 124 mg/dL — ABNORMAL HIGH (ref 65–99)

## 2015-11-23 LAB — MRSA PCR SCREENING: MRSA by PCR: NEGATIVE

## 2015-11-23 MED ORDER — ACETAMINOPHEN 325 MG PO TABS
650.0000 mg | ORAL_TABLET | Freq: Four times a day (QID) | ORAL | Status: DC | PRN
  Administered 2015-11-23 – 2015-11-26 (×2): 650 mg via ORAL
  Filled 2015-11-23 (×2): qty 2

## 2015-11-23 MED ORDER — ONDANSETRON HCL 4 MG/2ML IJ SOLN
4.0000 mg | Freq: Three times a day (TID) | INTRAMUSCULAR | Status: DC | PRN
  Administered 2015-11-23: 4 mg via INTRAVENOUS
  Filled 2015-11-23: qty 2

## 2015-11-23 MED ORDER — ATORVASTATIN CALCIUM 10 MG PO TABS
20.0000 mg | ORAL_TABLET | Freq: Every day | ORAL | Status: DC
  Administered 2015-11-24 – 2015-11-27 (×4): 20 mg via ORAL
  Filled 2015-11-23 (×3): qty 1
  Filled 2015-11-23: qty 2

## 2015-11-23 MED ORDER — SODIUM CHLORIDE 0.9 % IV SOLN
INTRAVENOUS | Status: DC
  Administered 2015-11-23 (×2): via INTRAVENOUS

## 2015-11-23 MED ORDER — LORAZEPAM 2 MG/ML IJ SOLN
INTRAMUSCULAR | Status: AC
  Filled 2015-11-23: qty 1

## 2015-11-23 MED ORDER — SERTRALINE HCL 100 MG PO TABS
100.0000 mg | ORAL_TABLET | Freq: Every day | ORAL | Status: DC
  Administered 2015-11-23 – 2015-11-28 (×6): 100 mg via ORAL
  Filled 2015-11-23 (×6): qty 1

## 2015-11-23 MED ORDER — HYDRALAZINE HCL 20 MG/ML IJ SOLN
5.0000 mg | INTRAMUSCULAR | Status: DC | PRN
  Administered 2015-11-23 – 2015-11-25 (×4): 5 mg via INTRAVENOUS
  Filled 2015-11-23 (×4): qty 1

## 2015-11-23 MED ORDER — TAMSULOSIN HCL 0.4 MG PO CAPS
0.4000 mg | ORAL_CAPSULE | Freq: Every day | ORAL | Status: DC
  Administered 2015-11-23 – 2015-11-28 (×6): 0.4 mg via ORAL
  Filled 2015-11-23 (×6): qty 1

## 2015-11-23 MED ORDER — LORAZEPAM 2 MG/ML IJ SOLN
1.0000 mg | Freq: Once | INTRAMUSCULAR | Status: AC
  Administered 2015-11-23: 1 mg via INTRAVENOUS

## 2015-11-23 MED ORDER — DIAZEPAM 5 MG PO TABS
5.0000 mg | ORAL_TABLET | Freq: Three times a day (TID) | ORAL | Status: DC | PRN
  Administered 2015-11-23 – 2015-11-25 (×5): 5 mg via ORAL
  Filled 2015-11-23 (×5): qty 1

## 2015-11-23 MED ORDER — BUTALBITAL-APAP-CAFFEINE 50-325-40 MG PO TABS
1.0000 | ORAL_TABLET | Freq: Three times a day (TID) | ORAL | Status: DC | PRN
  Administered 2015-11-23 – 2015-11-28 (×10): 1 via ORAL
  Filled 2015-11-23 (×10): qty 1

## 2015-11-23 MED ORDER — ALBUTEROL SULFATE (2.5 MG/3ML) 0.083% IN NEBU
3.0000 mL | INHALATION_SOLUTION | Freq: Four times a day (QID) | RESPIRATORY_TRACT | Status: DC | PRN
  Administered 2015-11-26 – 2015-11-27 (×2): 3 mL via RESPIRATORY_TRACT
  Filled 2015-11-23 (×2): qty 3

## 2015-11-23 MED ORDER — PANTOPRAZOLE SODIUM 40 MG PO TBEC
40.0000 mg | DELAYED_RELEASE_TABLET | Freq: Every day | ORAL | Status: DC
  Administered 2015-11-23 – 2015-11-27 (×5): 40 mg via ORAL
  Filled 2015-11-23 (×5): qty 1

## 2015-11-23 MED ORDER — LORAZEPAM BOLUS VIA INFUSION
1.0000 mg | Freq: Once | INTRAVENOUS | Status: DC
Start: 2015-11-23 — End: 2015-11-23
  Filled 2015-11-23: qty 1

## 2015-11-23 NOTE — Progress Notes (Signed)
STROKE TEAM PROGRESS NOTE   HISTORY OF PRESENT ILLNESS David Duke is an 81 y.o. 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 Patient reports that earlier this afternoon he developed some blurry vision in his left eye. He denies any weakness or speech disturbances, numbness, protruding posterior or gait problems. He went to his ophthalmologist's abdominal office to get an exam and was concerned for stroke, so sent the patient here for evaluation. Patient's wife additionally reports that the patient seemed confused earlier in the day today. He denies any recent illnesses including fever, headache, chest pain, shortness of breath, abdominal pain, nausea, vomiting, diarrhea, urinary frequency. No prior history of stroke. He is not on blood thinners. No recent head trauma.   Date last known well: 11/22/2015 Time last known well: Some time this morning, time of onset unknown tPA Given: No: Intracerebral hemorrhage ICH score: 1 Stroke Risk Factors - hypertension   SUBJECTIVE (INTERVAL HISTORY) 2 family members present. The patient complains of a headache and neck pain. He requests pain medication. He also requests medication for anxiety. He takes Valium at home PRN. He apparently anxious and concerning about his ICH. Not able to concentrate on exam during evaluation.     OBJECTIVE Temp:  [98.2 F (36.8 C)-99 F (37.2 C)] 98.7 F (37.1 C) (03/17 0400) Pulse Rate:  [73-87] 75 (03/17 0600) Cardiac Rhythm:  [-] Normal sinus rhythm (03/17 0400) Resp:  [16-29] 18 (03/17 0600) BP: (110-151)/(62-98) 128/79 mmHg (03/17 0600) SpO2:  [94 %-99 %] 94 % (03/17 0600) Weight:  [64.1 kg (141 lb 5 oz)] 64.1 kg (141 lb 5 oz) (03/16 2101)  CBC:  Recent Labs Lab 11/22/15 1720 11/22/15 1732 11/23/15 0720  WBC 12.8*  --  10.6*  NEUTROABS 11.3*  --   --   HGB 15.5 17.0 14.6  HCT 47.1  50.0 44.1  MCV 90.8  --  90.0  PLT PLATELETS APPEAR ADEQUATE  --  197    Basic Metabolic Panel:  Recent Labs Lab 11/22/15 1720 11/22/15 1732 11/23/15 0720  NA 136 137 139  K 4.3 4.2 4.1  CL 103 102 106  CO2 21*  --  21*  GLUCOSE 118* 111* 103*  BUN CREATININE 1.33* 1.30* 1.37*  CALCIUM 9.1  --  9.1    Lipid Panel:    Component Value Date/Time   CHOL 213* 11/22/2015 1839   TRIG 188* 11/22/2015 1839   HDL 53 11/22/2015 1839   CHOLHDL 4.0 11/22/2015 1839   VLDL 38 11/22/2015 1839   LDLCALC 122* 11/22/2015 1839   HgbA1c: No results found for: HGBA1C Urine Drug Screen:    Component Value Date/Time   LABOPIA NONE DETECTED 11/22/2015 1841   COCAINSCRNUR NONE DETECTED 11/22/2015 1841   LABBENZ POSITIVE* 11/22/2015 1841   AMPHETMU NONE DETECTED 11/22/2015 1841   THCU NONE DETECTED 11/22/2015 1841   LABBARB NONE DETECTED 11/22/2015 1841      IMAGING I have personally reviewed the radiological images below and agree with the radiology interpretations.  Ct Head Wo Contrast 11/22/2015   1. Acute intraparenchymal hemorrhage within the lower left parietal-occipital lobe, measuring 5.6 x 2.9 cm, with surrounding edema causing slight mass effect without midline shift or herniation. Hemorrhage extends into the adjacent posterior horn of the left lateral ventricle. Additional thin hemorrhage along the adjacent posterior falx and underlying tentorium.  2. Mild chronic small vessel ischemic  change within the periventricular white matter.   Ct Head Wo Contrast 11/23/2015 1. Mild enlargement of the left occipital lobe epicenter intra-axial hemorrhage, with estimated blood volume now of 46 mL. Stable surrounding edema and mild regional mass effect. 2. Small volume of intraventricular and subdural extension of blood is stable since yesterday. No ventriculomegaly. 3. No new intracranial abnormality identified.  TTE - pending  CUS - pending   PHYSICAL EXAM  Temp:  [97.6  F (36.4 C)-98.7 F (37.1 C)] 98.3 F (36.8 C) (03/17 2006) Pulse Rate:  [66-87] 82 (03/17 2006) Resp:  [14-29] 19 (03/17 2006) BP: (110-159)/(62-90) 114/73 mmHg (03/17 2006) SpO2:  [92 %-97 %] 95 % (03/17 2006)  General - Well nourished, well developed, anxious and in mild acute distress due to HA.  Ophthalmologic - Fundi not visualized due to noncooperation.  Cardiovascular - Regular rate and rhythm.  Mental Status -  Level of arousal and orientation to place, and person were intact, however, not orientated to time. Language including expression, repetition, comprehension was assessed and found intact, but not able to name objects due to agitation on exam. Attention span and concentration were greatly impaired due to HA and anxiety  Cranial Nerves II - XII - II - not cooperative on exam, inconsistent with testing. III, IV, VI - Extraocular movements intact. V - Facial sensation intact bilaterally. VII - Facial movement intact bilaterally. VIII - Hearing & vestibular intact bilaterally. X - Palate elevates symmetrically. XI - Chin turning & shoulder shrug intact bilaterally. XII - Tongue protrusion intact.  Motor Strength - The patient's strength was normal in all extremities and pronator drift was absent.  Bulk was normal and fasciculations were absent.   Motor Tone - Muscle tone was assessed at the neck and appendages and was normal.  Reflexes - The patient's reflexes were 1+ in all extremities and he had no pathological reflexes.  Sensory - Light touch, temperature/pinprick were assessed and were symmetrical.    Coordination - The patient had normal movements in the hands with no ataxia or dysmetria.  Tremor was absent.  Gait and Station - not tested due to HA    ASSESSMENT/PLAN Mr. David BarlowDavid R Duke is a 81 y.o. male with history of hyperlipidemia, hypertension, depression, anxiety, blind in the right eye, and COPD presenting with visual changes and confusion. He did  not receive IV t-PA due to intracerebral hemorrhage.  Stroke:  Acute ICH hemorrhage within the lower left parietal-occipital lobe, etiology not clear. From location and age, concerning for CAA. However, hypertensive bleed still in DDx although son stated pt has no hx of HTN at home.  Resultant  HA and agitation  MRI  deferred - patient is claustrophobic and not cooperative  MRA  Deferred - patient is claustrophobic and not cooperative  CTA deferred due to elevated Cre  CT repeat - mild enlargement of ICH in the lower left parietal-occipital lobe  Carotid Doppler pending  2D Echo  pending  LDL 122  HgbA1c pending  VTE prophylaxis - SCDs  Diet regular Room service appropriate?: Yes; Fluid consistency:: Thin  No antithrombotic prior to admission, now on No antithrombotic secondary to hemorrhage  Ongoing aggressive stroke risk factor management  Therapy recommendations:  Pending  Disposition:  Pending  Hypertension  Stable on the high side  Relaxation encouraged   Put back home meds  BP goal < 160  Son denies any hx of HTN  Hyperlipidemia  Home meds:  No lipid lowering medications prior to  admission.  LDL 122, goal < 70  Add statin  daily  Continue statin at discharge  Other Stroke Risk Factors  Advanced age  Cigarette smoker, advised to stop smoking  Other Active Problems  Anemia  Mildly elevated creatinine  Anxiety - put back on valium   Hospital day # 1  The patient is at risk for recurrent stroke and cerebral edema and neurological worsening and he needs ongoing stroke evaluation and aggressive risk factor modification. I had long discussion with son at bedside and over the phone, reviewed imaging with him, and updated him about diagnosis, treatment plan and prognosis.    Marvel Plan, MD PhD Stroke Neurology 11/23/2015 10:35 PM    To contact Stroke Continuity provider, please refer to [email or web address]. After hours, contact General  Neurology

## 2015-11-23 NOTE — Progress Notes (Signed)
Patient transferred from ER via stretcher on tele by ER RN. Oriented patient to unit and room, instructed on callbell and placed next to patient. Bed alarm on. Family at bedside. All questions answered at this time.

## 2015-11-23 NOTE — Progress Notes (Signed)
PT Cancellation Note  Patient Details Name: David BarlowDavid R Duke MRN: [id number] DOB: 09/18/1934   Cancelled Treatment:    Reason Eval/Treat Not Completed: Patient not medically ready.  Patient is on bedrest per orders.  MD:  Please write activity orders when appropriate for patient.  PT will initiate evaluation at that time.  Thank you.   Vena AustriaDavis, Aliviyah Malanga H 11/23/2015, 10:53 AM Durenda HurtSusan H. Renaldo Fiddleravis, PT, [facility] Acute Rehab Services Pager [phone]

## 2015-11-24 ENCOUNTER — Inpatient Hospital Stay (HOSPITAL_COMMUNITY): Payer: Medicare Other

## 2015-11-24 DIAGNOSIS — I639 Cerebral infarction, unspecified: Secondary | ICD-10-CM

## 2015-11-24 DIAGNOSIS — I6789 Other cerebrovascular disease: Secondary | ICD-10-CM

## 2015-11-24 LAB — URINE MICROSCOPIC-ADD ON
BACTERIA UA: NONE SEEN
WBC, UA: NONE SEEN WBC/hpf (ref 0–5)

## 2015-11-24 LAB — BASIC METABOLIC PANEL
ANION GAP: 9 (ref 5–15)
BUN: 17 mg/dL (ref 6–20)
CHLORIDE: 102 mmol/L (ref 101–111)
CO2: 22 mmol/L (ref 22–32)
Calcium: 8.7 mg/dL — ABNORMAL LOW (ref 8.9–10.3)
Creatinine, Ser: 1.36 mg/dL — ABNORMAL HIGH (ref 0.61–1.24)
GFR calc Af Amer: 56 mL/min — ABNORMAL LOW (ref >60)
GFR, EST NON AFRICAN AMERICAN: 48 mL/min — AB (ref >60)
GLUCOSE: 121 mg/dL — AB (ref 65–99)
POTASSIUM: 4.2 mmol/L (ref 3.5–5.1)
Sodium: 133 mmol/L — ABNORMAL LOW (ref 135–145)

## 2015-11-24 LAB — URINALYSIS, ROUTINE W REFLEX MICROSCOPIC
BILIRUBIN URINE: NEGATIVE
GLUCOSE, UA: NEGATIVE mg/dL
Ketones, ur: 15 mg/dL — AB
Leukocytes, UA: NEGATIVE
Nitrite: NEGATIVE
Protein, ur: NEGATIVE mg/dL
SPECIFIC GRAVITY, URINE: 1.023 (ref 1.005–1.030)
pH: 5 (ref 5.0–8.0)

## 2015-11-24 LAB — HEMOGLOBIN A1C
Hgb A1c MFr Bld: 5.9 % — ABNORMAL HIGH (ref 4.8–5.6)
Mean Plasma Glucose: 123 mg/dL

## 2015-11-24 LAB — CBC
HEMATOCRIT: 40.1 % (ref 39.0–52.0)
HEMOGLOBIN: 13.3 g/dL (ref 13.0–17.0)
MCH: 29.6 pg (ref 26.0–34.0)
MCHC: 33.2 g/dL (ref 30.0–36.0)
MCV: 89.3 fL (ref 78.0–100.0)
PLATELETS: 197 10*3/uL (ref 150–400)
RBC: 4.49 MIL/uL (ref 4.22–5.81)
RDW: 13 % (ref 11.5–15.5)
WBC: 14 10*3/uL — AB (ref 4.0–10.5)

## 2015-11-24 LAB — ECHOCARDIOGRAM COMPLETE
HEIGHTINCHES: 67 in
Weight: 2261.04 oz

## 2015-11-24 LAB — GLUCOSE, CAPILLARY
GLUCOSE-CAPILLARY: 101 mg/dL — AB (ref 65–99)
GLUCOSE-CAPILLARY: 104 mg/dL — AB (ref 65–99)
GLUCOSE-CAPILLARY: 95 mg/dL (ref 65–99)

## 2015-11-24 MED ORDER — LORAZEPAM 2 MG/ML IJ SOLN
INTRAMUSCULAR | Status: AC
  Filled 2015-11-24: qty 1

## 2015-11-24 MED ORDER — LORAZEPAM 2 MG/ML IJ SOLN
0.5000 mg | Freq: Once | INTRAMUSCULAR | Status: AC
  Administered 2015-11-24: 0.5 mg via INTRAVENOUS

## 2015-11-24 MED ORDER — QUETIAPINE FUMARATE 25 MG PO TABS
25.0000 mg | ORAL_TABLET | Freq: Every day | ORAL | Status: DC
  Administered 2015-11-24: 25 mg via ORAL

## 2015-11-24 MED ORDER — HALOPERIDOL LACTATE 5 MG/ML IJ SOLN
1.0000 mg | Freq: Once | INTRAMUSCULAR | Status: AC
  Administered 2015-11-24: 1 mg via INTRAVENOUS
  Filled 2015-11-24: qty 1

## 2015-11-24 MED ORDER — QUETIAPINE FUMARATE 25 MG PO TABS
25.0000 mg | ORAL_TABLET | Freq: Every day | ORAL | Status: DC
  Filled 2015-11-24 (×2): qty 1

## 2015-11-24 NOTE — Progress Notes (Signed)
VASCULAR LAB PRELIMINARY  PRELIMINARY  PRELIMINARY  PRELIMINARY  Carotid duplex completed.    Preliminary report:  Bilateral:  1-39% ICA stenosis.  Vertebral artery flow is antegrade.     Dalisha Shively, RVS 11/24/2015, 4:12 PM

## 2015-11-24 NOTE — Progress Notes (Signed)
PT Cancellation Note  Patient Details Name: David BarlowDavid R Bubar MRN: [id number] DOB: 04/26/1935   Cancelled Treatment:    Reason Eval/Treat Not Completed: Patient not medically ready.  Patient remains on bedrest per orders.  Also noted changes overnight - per chart patient with increased confusion, agitation, and decreased speech.  Will hold PT today. MD*  Please write activity orders when appropriate for patient.  PT will initiate evaluation at that time.  Thank you.   Vena AustriaDavis, Illiana Losurdo H 11/24/2015, 1:44 PM Durenda HurtSusan H. Renaldo Fiddleravis, PT, [facility] Pager [phone]

## 2015-11-24 NOTE — Progress Notes (Signed)
Dr. Cena BentonVega notified that patient now only oriented to place and with expressive aphasia since last neuro exam at 2230. NIH 5. Told to continue to monitor and update as needed for changes.

## 2015-11-24 NOTE — Consult Note (Signed)
Change in exam overnight - strictly a language change - will order eeg and ct head repeat for this am

## 2015-11-24 NOTE — Progress Notes (Signed)
Attempted EEG during set-up.  Pt became agitated.  Stopped application of electrodes.  RN aware.

## 2015-11-24 NOTE — Progress Notes (Signed)
STROKE TEAM PROGRESS NOTE   HISTORY OF PRESENT ILLNESS David Duke is an 81 y.o. 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 Patient reports that earlier this afternoon he developed some blurry vision in his left eye. He denies any weakness or speech disturbances, numbness, protruding posterior or gait problems. He went to his ophthalmologist's abdominal office to get an exam and was concerned for stroke, so sent the patient here for evaluation. Patient's wife additionally reports that the patient seemed confused earlier in the day today. He denies any recent illnesses including fever, headache, chest pain, shortness of breath, abdominal pain, nausea, vomiting, diarrhea, urinary frequency. No prior history of stroke. He is not on blood thinners. No recent head trauma.   Date last known well: 11/22/2015 Time last known well: Some time this morning, time of onset unknown tPA Given: No: Intracerebral hemorrhage ICH score: 1 Stroke Risk Factors - hypertension   SUBJECTIVE (INTERVAL HISTORY) 2 family members present and nurse. He is more confused, less oriented today. Repeat CT today appears stable. Could not tolerate MRI of the brain. WBCs elevated, infectious workup in progress.His home valium was restarted. Wife feels he is just a little bit confused. Is a heavy smoker, possibly nicotine withdrawal will order a patch.     OBJECTIVE Temp:  [97.6 F (36.4 C)-98.3 F (36.8 C)] 98 F (36.7 C) (03/18 0303) Pulse Rate:  [66-90] 80 (03/18 0600) Cardiac Rhythm:  [-] Normal sinus rhythm (03/18 0800) Resp:  [17-25] 18 (03/18 0600) BP: (112-159)/(73-91) 123/74 mmHg (03/18 0600) SpO2:  [90 %-97 %] 90 % (03/18 0600)  CBC:  Recent Labs Lab 11/22/15 1720  11/23/15 0720 11/24/15 0523  WBC 12.8*  --  10.6* 14.0*  NEUTROABS 11.3*  --   --   --   HGB 15.5  < > 14.6 13.3  HCT  47.1  < > 44.1 40.1  MCV 90.8  --  90.0 89.3  PLT PLATELETS APPEAR ADEQUATE  --  197 197  < > = values in this interval not displayed.  Basic Metabolic Panel:   Recent Labs Lab 11/23/15 0720 11/24/15 0523  NA 139 133*  K 4.1 4.2  CL 106 102  CO2 21* 22  GLUCOSE 103* 121*  BUN 10 17  CREATININE 1.37* 1.36*  CALCIUM 9.1 8.7*    Lipid Panel:     Component Value Date/Time   CHOL 213* 11/22/2015 1839   TRIG 188* 11/22/2015 1839   HDL 53 11/22/2015 1839   CHOLHDL 4.0 11/22/2015 1839   VLDL 38 11/22/2015 1839   LDLCALC 122* 11/22/2015 1839   HgbA1c:  Lab Results  Component Value Date   HGBA1C 5.9* 11/23/2015   Urine Drug Screen:     Component Value Date/Time   LABOPIA NONE DETECTED 11/22/2015 1841   COCAINSCRNUR NONE DETECTED 11/22/2015 1841   LABBENZ POSITIVE* 11/22/2015 1841   AMPHETMU NONE DETECTED 11/22/2015 1841   THCU NONE DETECTED 11/22/2015 1841   LABBARB NONE DETECTED 11/22/2015 1841      IMAGING I have personally reviewed the radiological images below and agree with the radiology interpretations.    Ct Head Wo Contrast 11/24/2015   1. Stable to slightly decreased left occipital lobe intraparenchymal hemorrhage with extension into the occipital horn of the left lateral ventricle. Stable sulcal effacement in the left parieto-occipital lobe. 2. Stable 4 mm right midline shift. Basilar cisterns remain patent. 3. Underlying  cerebral volume loss and mild chronic small vessel ischemia.  Ct Head Wo Contrast 11/23/2015 1. Mild enlargement of the left occipital lobe epicenter intra-axial hemorrhage, with estimated blood volume now of 46 mL. Stable surrounding edema and mild regional mass effect. 2. Small volume of intraventricular and subdural extension of blood is stable since yesterday. No ventriculomegaly. 3. No new intracranial abnormality identified.   Ct Head Wo Contrast 11/22/2015   1. Acute intraparenchymal hemorrhage within the lower left  parietal-occipital lobe, measuring 5.6 x 2.9 cm, with surrounding edema causing slight mass effect without midline shift or herniation. Hemorrhage extends into the adjacent posterior horn of the left lateral ventricle. Additional thin hemorrhage along the adjacent posterior falx and underlying tentorium.  2. Mild chronic small vessel ischemic change within the periventricular white matter.    Ct Head Wo Contrast 11/23/2015 1. Mild enlargement of the left occipital lobe epicenter intra-axial hemorrhage, with estimated blood volume now of 46 mL. Stable surrounding edema and mild regional mass effect. 2. Small volume of intraventricular and subdural extension of blood is stable since yesterday. No ventriculomegaly. 3. No new intracranial abnormality identified.  TTE - pending  CUS - pending   PHYSICAL EXAM  Temp:  [97.6 F (36.4 C)-98.3 F (36.8 C)] 98 F (36.7 C) (03/18 0303) Pulse Rate:  [66-90] 80 (03/18 0600) Resp:  [17-25] 18 (03/18 0600) BP: (112-159)/(73-91) 123/74 mmHg (03/18 0600) SpO2:  [90 %-97 %] 90 % (03/18 0600)  General - Well nourished, well developed  Ophthalmologic - Fundi not visualized due to noncooperation.  Cardiovascular - Regular rate and rhythm.  Lungs: Wheezing.  Mental Status -  Alert, not to place, year, month. Not oriented to name. Following some simple commands with difficulty, raised right arm to command after asking twice. Not agitated. Cannot name cup. Dysarthric.   Cranial Nerves II - XII - II - not cooperative on exam, inconsistent with testing. Right eye pupil surgical, left round and reactive.  III, IV, VI - Extraocular movements intact.  V - Facial sensation intact bilaterally. VII - Facial movement intact bilaterally. VIII - Hearing impaired & vestibular intact bilaterally. X - Palate elevates symmetrically. XI - Chin turning & shoulder shrug intact bilaterally. XII - Tongue protrusion intact.  Motor Strength - Difficul to fully  assess due to confusion, but appears all limbs are antigravity and equal.   Bulk was normal and fasciculations were absent.   Motor Tone - Muscle tone was assessed at the neck and appendages and was normal.  Reflexes - The patient's reflexes were 1+ in all extremities and he had no pathological reflexes.  Sensory - Deifficult to assess due to confusion, moved all limbs to stim.  Coordination - The patient had normal movements in the hands with no ataxia or dysmetria.  Tremor was absent.  Toes: downgoing bilat  Gait and Station - can stand unassisted. However gait not tested due to safety concerns    ASSESSMENT/PLAN Mr. JAXIEL KINES is a 81 y.o. male with history of hyperlipidemia, hypertension, depression, anxiety, blind in the right eye, and COPD presenting with visual changes and confusion. He did not receive IV t-PA due to intracerebral hemorrhage.  Stroke:  Acute ICH hemorrhage within the lower left parietal-occipital lobe, etiology not clear. From location and age, concerning for CAA. However, hypertensive bleed still in DDx although son stated pt has no hx of HTN at home.  Resultant  HA and agitation  MRI  deferred - patient is claustrophobic and not cooperative  MRA  Deferred - patient is claustrophobic and not cooperative  CTA deferred due to elevated Cre  CT repeat - 11/24/2015 - stable  Carotid Doppler pending  2D Echo  pending  LDL 122  HgbA1c 5.9  VTE prophylaxis - SCDs Diet regular Room service appropriate?: Yes; Fluid consistency:: Thin  No antithrombotic prior to admission, now on No antithrombotic secondary to hemorrhage  Ongoing aggressive stroke risk factor management  Therapy recommendations:  Pending  Disposition:  Pending  Hypertension  Stable   Relaxation encouraged   Put back home meds  BP goal < 160  Son denies any hx of HTN  Hyperlipidemia  Home meds:  No lipid lowering medications prior to admission.  LDL 122, goal <  70  Add statin 20mg  daily  Continue statin at discharge  Other Stroke Risk Factors  Advanced age  Cigarette smoker, advised to stop smoking  Other Active Problems  Anemia  Mildly elevated creatinine  Anxiety - back on valium  Tobacco withdrawal  Leukocytosis - afebrile - UA (not consistent with UTI) - blood cultures pending - chest x-ray pending  Headaches [facility]   Hospital day # 2   Personally examined patient and images, and have participated in and made any corrections needed to history, physical, neuro exam,assessment and plan as stated above.  I have personally obtained the history, evaluated lab date, reviewed imaging studies and agree with radiology interpretations.    Naomie DeanAntonia Faten Frieson, MD Neurology [phone] [facility]    To contact Stroke Continuity provider, please refer to [email or web address]. After hours, contact General Neurology

## 2015-11-24 NOTE — Progress Notes (Signed)
Pt combative and refusing EEG. MD notified. EEG deferred until later notice. Patient reoriented and assisted back to bed. Will continue to monitor.

## 2015-11-24 NOTE — Progress Notes (Signed)
Dr. Cena BentonVega notified that patient is now disoriented x 4. Will continue to monitor.

## 2015-11-24 NOTE — Progress Notes (Signed)
  Echocardiogram 2D Echocardiogram has been performed.  Arvil ChacoFoster, Frayda Egley 11/24/2015, 3:02 PM

## 2015-11-24 NOTE — Progress Notes (Signed)
Pt confused and attempting to crawl out of bed. Pt increasingly more agitated. MD notified, no new orders received. Safety sitter requested.

## 2015-11-25 ENCOUNTER — Inpatient Hospital Stay (HOSPITAL_COMMUNITY): Payer: Medicare Other

## 2015-11-25 DIAGNOSIS — G9340 Encephalopathy, unspecified: Secondary | ICD-10-CM | POA: Diagnosis present

## 2015-11-25 DIAGNOSIS — J449 Chronic obstructive pulmonary disease, unspecified: Secondary | ICD-10-CM | POA: Diagnosis present

## 2015-11-25 DIAGNOSIS — F419 Anxiety disorder, unspecified: Secondary | ICD-10-CM | POA: Diagnosis present

## 2015-11-25 DIAGNOSIS — N179 Acute kidney failure, unspecified: Secondary | ICD-10-CM | POA: Diagnosis present

## 2015-11-25 DIAGNOSIS — F329 Major depressive disorder, single episode, unspecified: Secondary | ICD-10-CM | POA: Diagnosis present

## 2015-11-25 DIAGNOSIS — F32A Depression, unspecified: Secondary | ICD-10-CM | POA: Diagnosis present

## 2015-11-25 DIAGNOSIS — E871 Hypo-osmolality and hyponatremia: Secondary | ICD-10-CM

## 2015-11-25 LAB — URINE MICROSCOPIC-ADD ON
BACTERIA UA: NONE SEEN
SQUAMOUS EPITHELIAL / LPF: NONE SEEN

## 2015-11-25 LAB — VITAMIN B12: VITAMIN B 12: 207 pg/mL (ref 180–914)

## 2015-11-25 LAB — CBC
HEMATOCRIT: 40.8 % (ref 39.0–52.0)
HEMOGLOBIN: 13.8 g/dL (ref 13.0–17.0)
MCH: 29.4 pg (ref 26.0–34.0)
MCHC: 33.8 g/dL (ref 30.0–36.0)
MCV: 87 fL (ref 78.0–100.0)
Platelets: 211 10*3/uL (ref 150–400)
RBC: 4.69 MIL/uL (ref 4.22–5.81)
RDW: 13 % (ref 11.5–15.5)
WBC: 15.2 10*3/uL — AB (ref 4.0–10.5)

## 2015-11-25 LAB — URINALYSIS, ROUTINE W REFLEX MICROSCOPIC
BILIRUBIN URINE: NEGATIVE
Glucose, UA: NEGATIVE mg/dL
KETONES UR: 15 mg/dL — AB
NITRITE: NEGATIVE
Protein, ur: NEGATIVE mg/dL
SPECIFIC GRAVITY, URINE: 1.011 (ref 1.005–1.030)
pH: 5 (ref 5.0–8.0)

## 2015-11-25 LAB — BASIC METABOLIC PANEL
ANION GAP: 13 (ref 5–15)
BUN: 23 mg/dL — ABNORMAL HIGH (ref 6–20)
CHLORIDE: 103 mmol/L (ref 101–111)
CO2: 17 mmol/L — AB (ref 22–32)
Calcium: 8.7 mg/dL — ABNORMAL LOW (ref 8.9–10.3)
Creatinine, Ser: 1.85 mg/dL — ABNORMAL HIGH (ref 0.61–1.24)
GFR calc non Af Amer: 33 mL/min — ABNORMAL LOW (ref >60)
GFR, EST AFRICAN AMERICAN: 39 mL/min — AB (ref >60)
Glucose, Bld: 84 mg/dL (ref 65–99)
POTASSIUM: 3.5 mmol/L (ref 3.5–5.1)
SODIUM: 133 mmol/L — AB (ref 135–145)

## 2015-11-25 LAB — GLUCOSE, CAPILLARY
GLUCOSE-CAPILLARY: 79 mg/dL (ref 65–99)
GLUCOSE-CAPILLARY: 100 mg/dL — AB (ref 65–99)
Glucose-Capillary: 111 mg/dL — ABNORMAL HIGH (ref 65–99)
Glucose-Capillary: 83 mg/dL (ref 65–99)

## 2015-11-25 MED ORDER — SODIUM CHLORIDE 0.9 % IV BOLUS (SEPSIS)
500.0000 mL | Freq: Once | INTRAVENOUS | Status: AC
  Administered 2015-11-25: 500 mL via INTRAVENOUS

## 2015-11-25 MED ORDER — DIAZEPAM 5 MG/ML IJ SOLN
10.0000 mg | Freq: Once | INTRAMUSCULAR | Status: AC
  Administered 2015-11-25: 10 mg via INTRAVENOUS
  Filled 2015-11-25: qty 2

## 2015-11-25 MED ORDER — SODIUM CHLORIDE 0.9 % IV SOLN
INTRAVENOUS | Status: AC
  Administered 2015-11-25: 18:00:00 via INTRAVENOUS

## 2015-11-25 MED ORDER — DIAZEPAM 5 MG PO TABS: 10.0000 mg | ORAL_TABLET | ORAL | Status: DC

## 2015-11-25 MED ORDER — DIAZEPAM 5 MG PO TABS
5.0000 mg | ORAL_TABLET | Freq: Two times a day (BID) | ORAL | Status: DC
  Administered 2015-11-25 – 2015-11-28 (×6): 5 mg via ORAL
  Filled 2015-11-25 (×6): qty 1

## 2015-11-25 NOTE — Consult Note (Signed)
[facility] Medical Consultation  David BarlowDavid R Fugett [id number] DOB: 02/03/1935 DOA: 11/22/2015 PCP: No primary care provider on file.   Requesting physician: [name] Date of consultation: 11/25/2015 Reason for consultation: ams  Impression/Recommendations Principal Problem:   Acute encephalopathy Active Problems:   ICH (intracerebral hemorrhage) (HCC)   Hyponatremia   Acute kidney injury (HCC)  #1. Acute encephalopathy. His son and RN so state that he was alert and very coherent and was able to eat a small amount about 1 hr ago. Per son, he is still relatively confused and was more oriented on the day he arrived to the ER and has steadily become confused.  Patient was mildly confused per family prior to admission the symptoms for presentation. Etiology unclear. May be multifactorial. Acute ICH hemorrhage as well as worsening acute kidney injury likely related to dehydration and mild hyponatremia. Also concern for infectious process given worsening leukocytosis and fever. O In addition home medications include Valium 5 mg 3 times a day as needed.  Repeat head ct unremarkable. patient has been receiving some benzos over the last 3 days. Improved slightly this afternoon after receiving 10 mg of Valium IV in preparation for neuro imaging  -may need to repeat chest x-ray after hydration if he becomes hypoxic to ensure no appearance of infiltrate -  urinalysis negative -Repeat MRI per neurology -Obtain folate and B-12, RPR -he has been receiving Valium 5 mg BID over the past couple of days cont every 12 hours -incentive spirometry -500 cc bolus of normal saline -Increase IV fluids- he was not eating for 2 days and IVF were at 50 cc/hr -tylenol for fever  #2. Acute kidney injury. Likely related to dehydration as result of decreased oral intake. Creatinine 1.3 on admission. Creatinine today 1.85. Chart review indicates creatinine 7 months ago 1.2. -Bolus of normal saline -Increase  IV rate -Avoid nephrotoxins -Monitor urine output  #3. Hyponatremia. Mild. Likely related to dehydration. Patient appears dry. -IV fluids as noted above -Recheck in the morning   Thank you for this consultation. [facility] will follow up tomorrow.  Chief Complaint: ams  HPI:  David Duke is a 81 year old male with past medical history of macular degeneration with subsequent blindness in his right eye,  COPD, anxiety admitted 3 days ago for stroke. Was found to have acute ICH hemorrhage noted by neuro managed by the stroke team. Today he developed some altered mental status. Review indicates it's worse at night. In addition he developed a fever of 100.9 mild tachycardia mild tachypnea. Blood pressure remained stable as did oxygen saturation level.   Yesterday repeat head CT stable, repeat chest x-ray revealed shallow inflation no evidence for acute cardiopulmonary abnormality, urinalysis done yesterday with no WBCs and no bacteria. Today's labs reveal leukocytosis trending up from 10.6-15.2, creatinine trending from 1. 371.85, sodium trending from 139-133. Is not hypoxic no recent trauma  Review of Systems:  Unable to obtain 10 point review of systems do to patient's acute encephalopathy  Past Medical History  Diagnosis Date  . High cholesterol   . Depression   . Anxiety   . Macular degeneration of both eyes   . Blind right eye   . COPD (chronic obstructive pulmonary disease) (HCC)   . Shortness of breath dyspnea    Past Surgical History  Procedure Laterality Date  . No past surgeries     Social History:  reports that he has been smoking Cigarettes.  He has a 30 pack-year smoking history. He does not have  any smokeless tobacco history on file. He reports that he does not drink alcohol or use illicit drugs.  No Known Allergies Family History  Problem Relation Age of Onset  . Colon cancer Mother   . Cerebral aneurysm Father     Prior to Admission medications   Medication Sig Start  Date End Date Taking? Authorizing Provider  albuterol (PROVENTIL HFA;VENTOLIN HFA) 108 (90 Base) MCG/ACT inhaler Inhale 2 puffs into the lungs every 6 (six) hours as needed for wheezing or shortness of breath.   Yes Historical Provider, MD  calcium-vitamin D (OSCAL WITH D) 500-200 MG-UNIT tablet Take 1 tablet by mouth daily with breakfast.   Yes Historical Provider, MD  diazepam (VALIUM) 5 MG tablet Take 5 mg by mouth 3 (three) times daily as needed for anxiety.   Yes Historical Provider, MD  Omega-3 Fatty Acids (FISH OIL) 1000 MG CAPS Take 1,000 mg by mouth daily.   Yes Historical Provider, MD  promethazine-codeine (PHENERGAN WITH CODEINE) 6.25-10 MG/5ML syrup Take 5 mLs by mouth every 6 (six) hours as needed for cough.   Yes Historical Provider, MD  sertraline (ZOLOFT) 100 MG tablet Take 100 mg by mouth daily.   Yes Historical Provider, MD  silodosin (RAPAFLO) 8 MG CAPS capsule Take 8 mg by mouth daily with breakfast.   Yes Historical Provider, MD   Physical Exam: Blood pressure 125/69, pulse 77, temperature 100.9 F (38.3 C), temperature source Rectal, resp. rate 18, height  (1.702 m), weight 64.1 kg (141 lb 5 oz), SpO2 97 %. Filed Vitals:   11/25/15 1000 11/25/15 1200  BP: 132/79 125/69  Pulse:    Temp:  100.9 F (38.3 C)  Resp: 22 18     General:  Patient is somewhat thin somewhat acutely ill appearing face is flushed resting quietly when I enter the room  Eyes: Pupils equal round reactive to light EOMI no scleral icterus3  ENT: Ears clear nose without drainage oropharynx without erythema or exudate mouth and lips are quite dry  Neck: Supple full range of motion no JVD  Cardiovascular: Regular rate and rhythm I hear no murmur gallop or rub heart sounds are somewhat distant no lower extremity edema  Respiratory: Normal effort breath sounds somewhat distant respirations slightly shallow no crackles, rhonchi  Abdomen: Flat soft positive bowel sounds no guarding or  rebounding  Skin: Face is flushed skin is warm and dry otherwise slightly pale no rashes or lesions  Musculoskeletal: Fair muscle tone joints without swelling/erythema  Psychiatric: Agitated  Neurologic: Does not respond verbally will make brief eye contact is not follow commands attempts to respond verbally both hands admits moves all extremities spontaneously uncooperative with exam clearly agitated  Labs on Admission:  Basic Metabolic Panel:  Recent Labs Lab 11/22/15 1720 11/22/15 1732 11/23/15 0720 11/24/15 0523 11/25/15 0511  NA 136 137 139 133* 133*  K 4.3 4.2 4.1 4.2 3.5  CL 103 102 106 102 103  CO2 21*  --  21* 22 17*  GLUCOSE 118* 111* 103* 121* 84  BUN 23*  CREATININE 1.33* 1.30* 1.37* 1.36* 1.85*  CALCIUM 9.1  --  9.1 8.7* 8.7*   Liver Function Tests:  Recent Labs Lab 11/22/15 1720  AST 22  ALT 15*  ALKPHOS 90  BILITOT 0.5  PROT 7.1  ALBUMIN 3.9   No results for input(s): LIPASE, AMYLASE in the last 168 hours. No results for input(s): AMMONIA in the last 168 hours. CBC:  Recent Labs  Lab 11/22/15 1720 11/22/15 1732 11/23/15 0720 11/24/15 0523 11/25/15 0511  WBC 12.8*  --  10.6* 14.0* 15.2*  NEUTROABS 11.3*  --   --   --   --   HGB 15.5 17.0 14.6 13.3 13.8  HCT 47.1 50.0 44.1 40.1 40.8  MCV 90.8  --  90.0 89.3 87.0  PLT PLATELETS APPEAR ADEQUATE  --  197 197 211   Cardiac Enzymes: No results for input(s): CKTOTAL, CKMB, CKMBINDEX, TROPONINI in the last 168 hours. BNP: Invalid input(s): POCBNP CBG:  Recent Labs Lab 11/24/15 1146 11/24/15 1711 11/24/15 2146 11/25/15 0817 11/25/15 1312  GLUCAP 101* 104* 95 79 100*    Radiological Exams on Admission: Ct Head Wo Contrast  11/24/2015  CLINICAL DATA:  Follow-up intraparenchymal hemorrhage. Aphasia. Change in exam . EXAM: CT HEAD WITHOUT CONTRAST TECHNIQUE: Contiguous axial images were obtained from the base of the skull through the vertex without intravenous contrast.  COMPARISON:  11/23/2015 head CT. FINDINGS: Left occipital lobe intraparenchymal hemorrhage measures 6.3 x 3.4 cm, compared to 6.5 x 3.5 cm on 11/23/2015, stable to minimally decreased. Stable vasogenic edema surrounding the hematoma. No new sites of intracranial hemorrhage. Stable 4 mm right midline shift. Stable hemorrhage layering in the occipital horn of the left lateral ventricle. Stable ventricular sizes. No significant extra-axial collections. Stable sulcal effacement throughout the left parieto-occipital lobe. Basilar cisterns remain patent. Diffuse cerebral volume loss. No CT evidence of acute intracranial hemorrhage. Nonspecific subcortical and periventricular white matter hypodensity, most in keeping with chronic small vessel ischemic change. The visualized paranasal sinuses are essentially clear. The mastoid air cells are unopacified. No evidence of calvarial fracture. IMPRESSION: 1. Stable to slightly decreased left occipital lobe intraparenchymal hemorrhage with extension into the occipital horn of the left lateral ventricle. Stable sulcal effacement in the left parieto-occipital lobe. 2. Stable 4 mm right midline shift.  Basilar cisterns remain patent. 3. Underlying cerebral volume loss and mild chronic small vessel ischemia. Electronically Signed   By: Delbert Phenix M.D.   On: 11/24/2015 11:05   Dg Chest Port 1 View  11/24/2015  CLINICAL DATA:  Leukocytosis EXAM: PORTABLE CHEST 1 VIEW COMPARISON:  A 04/17/2015 FINDINGS: Shallow lung inflation. Heart size is normal. Aorta is mildly tortuous. The lungs are free of focal consolidations and pleural effusions. No pulmonary edema. IMPRESSION: 1. Shallow inflation. 2.  No evidence for acute cardiopulmonary abnormality. Electronically Signed   By: Norva Pavlov M.D.   On: 11/24/2015 15:53    EKG:   Time spent: 60 minutes  [facility]   If 7PM-7AM, please contact night-coverage [email or web address] Password [email or web address] 11/25/2015, 3:16  PM   I have examined the patient, reviewed the chart and modified the above note which I agree with. Treat dehydration, monitor for infection, cont Valium but hold if sedated.   May be hospital acquired delirium.   Dorean Hiebert,MD Pager # [email or web address] 11/25/2015, 4:25 PM

## 2015-11-25 NOTE — Progress Notes (Signed)
Pt spit out oral medications.

## 2015-11-25 NOTE — Progress Notes (Signed)
Pt restless, attempting to pull at lines and get out of bed.  Pt oriented x 0 with expressive aphasia, wife states her husband is "much worse than last night" although report and notes indicate he has been in this condition all day.  Dr. Cena BentonVega on call notified and new orders given.  Family updated.  Will continue to monitor.

## 2015-11-25 NOTE — Progress Notes (Signed)
OT Cancellation Note  Patient Details Name: Marcell BarlowDavid R Casares MRN: [id number] DOB: 12/19/1934   Cancelled Treatment:    Reason Eval/Treat Not Completed: Patient not medically ready. Pt still on bedrest. Called and spoke with pt's RN who then called MDs to see if pt could come off of bedrest; however they want to wait until at least tomorrow before therapies start. Will check back at a later date.  Evette GeorgesLeonard, Wilfrid Hyser Eva [phone] 11/25/2015, 4:35 PM

## 2015-11-25 NOTE — Progress Notes (Signed)
Patient this morning was drowsy and agitated with nothing verbally to expressive aphasia.  Pt around 1400 did wake up more and spoke clearly. Pt was able to eat an apple sauce and a few spoonfuls of jello. Pt went back to sleep shortly after and hasn't spoken clearly since.

## 2015-11-25 NOTE — Plan of Care (Signed)
Problem: Safety: Goal: Ability to remain free from injury will improve Outcome: Not Met (add Reason) Pt continuing to try to get out of bed creating a safety issue.

## 2015-11-25 NOTE — Progress Notes (Signed)
PT Cancellation Note  Patient Details Name: David BarlowDavid R Ramberg MRN: [id number] DOB: 10/07/1934   Cancelled Treatment:    Reason Eval/Treat Not Completed: Patient not medically ready.  Pt remains on bed rest.  Please update activity orders, once medically appropriate, for PT evaluation.    Encarnacion ChuAshley Abashian PT, DPT  Pager: [phone] Phone: [phone] 11/25/2015, 8:42 AM

## 2015-11-25 NOTE — Progress Notes (Signed)
STROKE TEAM PROGRESS NOTE   HISTORY OF PRESENT ILLNESS David Duke is an 81 y.o. 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 Patient reports that earlier this afternoon he developed some blurry vision in his left eye. He denies any weakness or speech disturbances, numbness, protruding posterior or gait problems. He went to his ophthalmologist's abdominal office to get an exam and was concerned for stroke, so sent the patient here for evaluation. Patient's wife additionally reports that the patient seemed confused earlier in the day today. He denies any recent illnesses including fever, headache, chest pain, shortness of breath, abdominal pain, nausea, vomiting, diarrhea, urinary frequency. No prior history of stroke. He is not on blood thinners. No recent head trauma.   Date last known well: 11/22/2015 Time last known well: Some time this morning, time of onset unknown tPA Given: No: Intracerebral hemorrhage ICH score: 1 Stroke Risk Factors - hypertension   SUBJECTIVE (INTERVAL HISTORY) Still confused, not speaking, repeat CT yesterday stable, WBCs elevated and febrile. CXR neg, UA without infection, spitting out his medications.  No diarrhea. Elevated creatinine. Will increase fluids. Could not tolerate MRi of the brain again today even with 69m diazepam. Could not tolerate EEG. We'll consult internal medicine appreciate recommendations. Son and wife at bedside. They've had extensive conversations with him regarding his continued agitation and unknown source of leukocytosis and fever.   OBJECTIVE Temp:  [98 F (36.7 C)-98.3 F (36.8 C)] 98.1 F (36.7 C) (03/19 0348) Pulse Rate:  [71-109] 85 (03/19 0705) Cardiac Rhythm:  [-] Normal sinus rhythm (03/19 0739) Resp:  [18-28] 22 (03/19 0348) BP: (115-156)/(57-85) 136/78 mmHg (03/19 0705) SpO2:  [91 %-98 %] 93 % (03/19  0705)  CBC:  Recent Labs Lab 11/22/15 1720  11/24/15 0523 11/25/15 0511  WBC 12.8*  < > 14.0* 15.2*  NEUTROABS 11.3*  --   --   --   HGB 15.5  < > 13.3 13.8  HCT 47.1  < > 40.1 40.8  MCV 90.8  < > 89.3 87.0  PLT PLATELETS APPEAR ADEQUATE  < > 197 211  < > = values in this interval not displayed.  Basic Metabolic Panel:   Recent Labs Lab 11/24/15 0523 11/25/15 0511  NA 133* 133*  K 4.2 3.5  CL 102 103  CO2 22 17*  GLUCOSE 121* 84  BUN 17 23*  CREATININE 1.36* 1.85*  CALCIUM 8.7* 8.7*    Lipid Panel:     Component Value Date/Time   CHOL 213* 11/22/2015 1839   TRIG 188* 11/22/2015 1839   HDL 53 11/22/2015 1839   CHOLHDL 4.0 11/22/2015 1839   VLDL 38 11/22/2015 1839   LDLCALC 122* 11/22/2015 1839   HgbA1c:  Lab Results  Component Value Date   HGBA1C 5.9* 11/23/2015   Urine Drug Screen:     Component Value Date/Time   LABOPIA NONE DETECTED 11/22/2015 1841   COCAINSCRNUR NONE DETECTED 11/22/2015 1841   LABBENZ POSITIVE* 11/22/2015 1841   AMPHETMU NONE DETECTED 11/22/2015 1841   THCU NONE DETECTED 11/22/2015 1841   LABBARB NONE DETECTED 11/22/2015 1841      IMAGING I have personally reviewed the radiological images below and agree with the radiology interpretations.    Ct Head Wo Contrast 11/24/2015   1. Stable to slightly decreased left occipital lobe intraparenchymal hemorrhage with extension into the occipital horn of the left lateral ventricle. Stable sulcal effacement in  the left parieto-occipital lobe. 2. Stable 4 mm right midline shift. Basilar cisterns remain patent. 3. Underlying cerebral volume loss and mild chronic small vessel ischemia.  Ct Head Wo Contrast 11/23/2015 1. Mild enlargement of the left occipital lobe epicenter intra-axial hemorrhage, with estimated blood volume now of 46 mL. Stable surrounding edema and mild regional mass effect. 2. Small volume of intraventricular and subdural extension of blood is stable since yesterday. No  ventriculomegaly. 3. No new intracranial abnormality identified.   Ct Head Wo Contrast 11/22/2015   1. Acute intraparenchymal hemorrhage within the lower left parietal-occipital lobe, measuring 5.6 x 2.9 cm, with surrounding edema causing slight mass effect without midline shift or herniation. Hemorrhage extends into the adjacent posterior horn of the left lateral ventricle. Additional thin hemorrhage along the adjacent posterior falx and underlying tentorium.  2. Mild chronic small vessel ischemic change within the periventricular white matter.    Ct Head Wo Contrast 11/23/2015 1. Mild enlargement of the left occipital lobe epicenter intra-axial hemorrhage, with estimated blood volume now of 46 mL. Stable surrounding edema and mild regional mass effect. 2. Small volume of intraventricular and subdural extension of blood is stable since yesterday. No ventriculomegaly. 3. No new intracranial abnormality identified.  TTE  Study Conclusions - Left ventricle: The cavity size was normal. Wall thickness was  normal. Systolic function was vigorous. The estimated ejection  fraction was in the range of 65% to 70%. Wall motion was normal;  there were no regional wall motion abnormalities. Doppler  parameters are consistent with abnormal left ventricular  relaxation (grade 1 diastolic dysfunction). - Left atrium: The atrium was mildly dilated. Impressions: - Extremely limited due to poor sound wave transmission; vigorous  LV function; grade 1 diastolic dysfunction; mild LAE.   CUS  Bilateral: 1-39% ICA stenosis. Vertebral artery flow is antegrade.   PHYSICAL EXAM  Temp:  [98 F (36.7 C)-98.3 F (36.8 C)] 98.1 F (36.7 C) (03/19 0348) Pulse Rate:  [71-109] 85 (03/19 0705) Resp:  [18-28] 22 (03/19 0348) BP: (115-156)/(57-85) 136/78 mmHg (03/19 0705) SpO2:  [91 %-98 %] 93 % (03/19 0705)  General - Well nourished, well developed  Ophthalmologic - Fundi not visualized due to  noncooperation.  Cardiovascular - Regular rate and rhythm..  Mental Status -  Patient is agitated, not following commands, nonverbal.   Cranial Nerves II - XII - II -  Right eye pupil surgical, left round and reactive.  III, IV, VI - Extraocular movements intact.  V - Facial sensation intact bilaterally. VII - Facial movement intact bilaterally. VIII - Hearing impaired  X - Palate elevates symmetrically. XI -  shoulder shrug intact bilaterally. XII - Tongue protrusion intact.  Motor Strength - Difficul to fully assess due to confusion, but appears all limbs are antigravity and equal.   Bulk was normal and fasciculations were absent.   Motor Tone - Muscle tone was assessed at the neck and appendages and was normal.  Reflexes - The patient's reflexes were 1+ in all extremities and he had no pathological reflexes.  Sensory - Deifficult to assess due to confusion, moved all limbs to stim.  Coordination - The patient had normal movements in the hands with no apparent ataxia or dysmetria.  Tremor was absent.  Toes: downgoing bilat  Gait and Station - can stand unassisted. However gait not tested due to safety concerns    ASSESSMENT/PLAN [name] is a 81 y.o. male with history of hyperlipidemia, hypertension, depression, anxiety, blind in the  right eye, and COPD presenting with visual changes and confusion. He did not receive IV t-PA due to intracerebral hemorrhage.  Stroke:  Acute ICH hemorrhage within the lower left parietal-occipital lobe, etiology not clear. From location and age, concerning for CAA. However, hypertensive bleed still in DDx although son stated pt has no hx of HTN at home.  Resultant  HA and agitation.  Elevated temperature, elevated WBCs, AKI, hyponatremia. Will increase fluids. Internal medicine consult.   MRI  deferred - patient is claustrophobic and not cooperative  MRA  Deferred - patient is claustrophobic and not cooperative  CTA deferred  due to elevated Cre  CT repeat - 11/24/2015 - stable. Attempted MRI and MRA on 11/25/2015 and again and patient will not tolerate even less increased diazepam.  Carotid Doppler - Bilateral: 1-39% ICA stenosis. Vertebral artery flow is antegrade.   2D Echo  EF 65-70%. Extremely poor images but no cardiac source of emboli identified.  LDL 122  HgbA1c 5.9  VTE prophylaxis - SCDs Diet regular Room service appropriate?: Yes; Fluid consistency:: Thin  No antithrombotic prior to admission, now on No antithrombotic secondary to hemorrhage  Ongoing aggressive stroke risk factor management  Therapy recommendations:  Pending (patient still on bedrest secondary to confusion)  Disposition:  Pending  Hypertension  Stable   Relaxation encouraged   Put back home meds  BP goal < 160  Son denies any hx of HTN  Hyperlipidemia  Home meds:  No lipid lowering medications prior to admission.  LDL 122, goal < 70  Add statin 22m daily  Continue statin at discharge  Other Stroke Risk Factors  Advanced age  Cigarette smoker, advised to stop smoking  Other Active Problems  Anemia  Mildly elevated creatinine  Anxiety - back on valium. Per wife patient takes it possibly 3 times a week at most.  Tobacco withdrawal: We've reports he is not a heavy smoker and this is likely tobacco withdrawal.  Leukocytosis - afebrile - UA (not consistent with UTI) - blood cultures pending - chest x-ray - negative  Headaches - Fioricet  CBC and BMP met on Monday  Appreciate internal medicine recommendations.   Hospital day # 359 81year old male who presented with an acute intraparenchymal hemorrhage in the left parietal occipital lobe. Repeat CT scans have been stable. Patient may have some early dementia at baseline.  Still confused, not speaking, repeat CT yesterday stable, WBCs elevated and febrile. CXR neg, UA without infection, spitting out his medications.  No diarrhea. Elevated  creatinine. Will increase fluids. Could not tolerate MRi of the brain again today even with 157mdiazepam. Could not tolerate EEG. Appreciate internal medicine recommendations.  Personally examined patient and images, and have participated in and made any corrections needed to history, physical, neuro exam,assessment and plan as stated above.  I have personally obtained the history, evaluated lab date, reviewed imaging studies and agree with radiology interpretations.    AnSarina IllMD Neurology [phone]    To contact Stroke Continuity provider, please refer to [email or web address], contact General Neurology

## 2015-11-25 NOTE — Progress Notes (Signed)
Gave patient IV Valium prior to transport to MRI. Pt was sleeping during transport however with attempting to get patient positioned on the bed for MRI pt was agitated and stated "What the hell." Pt would not lay his head down for the MRI and continued to attempt to sit up. MRI was unable to be done. Once patient laying on his side he went back to sleep.

## 2015-11-26 ENCOUNTER — Inpatient Hospital Stay (HOSPITAL_COMMUNITY): Payer: Medicare Other

## 2015-11-26 DIAGNOSIS — G9340 Encephalopathy, unspecified: Secondary | ICD-10-CM

## 2015-11-26 DIAGNOSIS — R4701 Aphasia: Secondary | ICD-10-CM | POA: Insufficient documentation

## 2015-11-26 LAB — CBC WITH DIFFERENTIAL/PLATELET
Basophils Absolute: 0 10*3/uL (ref 0.0–0.1)
Basophils Relative: 0 %
Eosinophils Absolute: 0 10*3/uL (ref 0.0–0.7)
Eosinophils Relative: 0 %
HCT: 37.7 % — ABNORMAL LOW (ref 39.0–52.0)
Hemoglobin: 12.5 g/dL — ABNORMAL LOW (ref 13.0–17.0)
Lymphocytes Relative: 15 %
Lymphs Abs: 1.6 10*3/uL (ref 0.7–4.0)
MCH: 29.3 pg (ref 26.0–34.0)
MCHC: 33.2 g/dL (ref 30.0–36.0)
MCV: 88.5 fL (ref 78.0–100.0)
Monocytes Absolute: 1 10*3/uL (ref 0.1–1.0)
Monocytes Relative: 9 %
Neutro Abs: 7.9 10*3/uL — ABNORMAL HIGH (ref 1.7–7.7)
Neutrophils Relative %: 76 %
Platelets: 164 10*3/uL (ref 150–400)
RBC: 4.26 MIL/uL (ref 4.22–5.81)
RDW: 13.2 % (ref 11.5–15.5)
WBC: 10.5 10*3/uL (ref 4.0–10.5)

## 2015-11-26 LAB — GLUCOSE, CAPILLARY
GLUCOSE-CAPILLARY: 112 mg/dL — AB (ref 65–99)
Glucose-Capillary: 86 mg/dL (ref 65–99)
Glucose-Capillary: 110 mg/dL — ABNORMAL HIGH (ref 65–99)
Glucose-Capillary: 176 mg/dL — ABNORMAL HIGH (ref 65–99)

## 2015-11-26 LAB — URINE CULTURE: Culture: NO GROWTH

## 2015-11-26 LAB — BASIC METABOLIC PANEL
ANION GAP: 9 (ref 5–15)
BUN: 19 mg/dL (ref 6–20)
CHLORIDE: 108 mmol/L (ref 101–111)
CO2: 20 mmol/L — AB (ref 22–32)
Calcium: 8 mg/dL — ABNORMAL LOW (ref 8.9–10.3)
Creatinine, Ser: 1.39 mg/dL — ABNORMAL HIGH (ref 0.61–1.24)
GFR calc non Af Amer: 47 mL/min — ABNORMAL LOW (ref >60)
GFR, EST AFRICAN AMERICAN: 54 mL/min — AB (ref >60)
Glucose, Bld: 102 mg/dL — ABNORMAL HIGH (ref 65–99)
Potassium: 3 mmol/L — ABNORMAL LOW (ref 3.5–5.1)
Sodium: 137 mmol/L (ref 135–145)

## 2015-11-26 LAB — RPR: RPR Ser Ql: NONREACTIVE

## 2015-11-26 LAB — FOLATE RBC
FOLATE, HEMOLYSATE: 462.4 ng/mL
Folate, RBC: 1125 ng/mL (ref >498)
Hematocrit: 41.1 % (ref 37.5–51.0)

## 2015-11-26 MED ORDER — CALCIUM CARBONATE-VITAMIN D 500-200 MG-UNIT PO TABS
1.0000 | ORAL_TABLET | Freq: Every day | ORAL | Status: DC
  Administered 2015-11-27 – 2015-11-28 (×2): 1 via ORAL
  Filled 2015-11-26 (×2): qty 1

## 2015-11-26 MED ORDER — SODIUM CHLORIDE 0.9 % IV BOLUS (SEPSIS)
500.0000 mL | Freq: Once | INTRAVENOUS | Status: AC
  Administered 2015-11-26: 500 mL via INTRAVENOUS

## 2015-11-26 NOTE — Progress Notes (Signed)
Rehab Admissions Coordinator Note:  Patient was screened by Clois DupesBoyette, Dvon Jiles Godwin for appropriateness for an Inpatient Acute Rehab Consult per PT recommendation.  At this time, we are recommending Inpatient Rehab consult.  Clois DupesBoyette, Ren Aspinall Godwin 11/26/2015, 5:34 PM  I can be reached at [phone].

## 2015-11-26 NOTE — Evaluation (Signed)
Speech Language Pathology Evaluation Patient Details Name: David Duke MRN: [id number] DOB: 1935/03/14 Today's Date: 11/26/2015 Time: 1755-1830 SLP Time Calculation (min) (ACUTE ONLY): 35 min  Problem List:  Patient Active Problem List   Diagnosis Date Noted  . Acute encephalopathy 11/25/2015  . Hyponatremia 11/25/2015  . Acute kidney injury (HCC) 11/25/2015  . Depression   . Anxiety   . COPD (chronic obstructive pulmonary disease) (HCC)   . ICH (intracerebral hemorrhage) (HCC) 11/22/2015   Past Medical History:  Past Medical History  Diagnosis Date  . High cholesterol   . Depression   . Anxiety   . Macular degeneration of both eyes   . Blind right eye   . COPD (chronic obstructive pulmonary disease) (HCC)   . Shortness of breath dyspnea    Past Surgical History:  Past Surgical History  Procedure Laterality Date  . No past surgeries     HPI:  Pt is a 81 y.o. 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 Head CT showed acute intraparenchymal hemorrhage measuring 5.6 x 2.9 cm, with surrounding edema causing slight mass effect without midline shift or herniation. Hemorrhage extends into the adjacent posterior horn of the left lateral ventricle. Additional thin hemorrhage along the adjacent posterior falx and underlying tentorium. Pt with worsening confusion/ disorientation since admission, EEG 3/20 was abnormal due to background slowing. SLP eval ordered as part of stroke workup.   Assessment / Plan / Recommendation Clinical Impression  Pt currently with moderate deficits in the areas of cognition and receptive/ expressive language. Pt was oriented to self and place but disoriented to time and somewhat disoriented to situation- unable to state having a stroke but able to tell something had happened with eye. Pt able to follow functional 1-step commands; often needed  questions/ prompts repeated. Pt tangential in conversation and with decreased sustained/ selective attention, making conversational speech difficult to follow. Pt with frequent word finding difficulties in conversation and was aware of this; occasional phonemic paraphasias and jargon. Son at bedside noted that pt was especially "out of it" during this evaluation from just waking up from a nap. Pt would benefit from SLP services in order to address attention, memory/ recall of new information, and language skills in order to increase safety while in acute care setting. Will continue to follow.    SLP Assessment  Patient needs continued Speech Lanaguage Pathology Services    Follow Up Recommendations  Inpatient Rehab    Frequency and Duration min 2x/week  2 weeks      SLP Evaluation Prior Functioning  Type of Home: House Available Help at Discharge: Family   Cognition  Overall Cognitive Status: Impaired/Different from baseline Arousal/Alertness: Awake/alert Orientation Level: Oriented to person;Oriented to place;Disoriented to time;Disoriented to situation Attention: Focused;Sustained;Selective Focused Attention: Appears intact Sustained Attention: Appears intact Selective Attention: Impaired Selective Attention Impairment: Verbal basic Memory: Impaired Memory Impairment: Decreased recall of new information Awareness: Appears intact Executive Function: Sequencing;Organizing Sequencing: Impaired Sequencing Impairment: Verbal basic Organizing: Impaired Organizing Impairment: Verbal basic Behaviors: Other (comment) (tangential)    Comprehension  Auditory Comprehension Overall Auditory Comprehension: Impaired Yes/No Questions: Within Functional Limits Commands: Impaired One Step Basic Commands: 75-100% accurate Conversation: Simple Interfering Components: Attention EffectiveTechniques: Extra processing time;Increased volume Reading Comprehension Reading Status: Unable to  assess (comment)    Expression Expression Primary Mode of Expression: Verbal Verbal Expression Overall Verbal Expression: Impaired Initiation: No impairment Level of  Generative/Spontaneous Verbalization: OncologistConversation Naming:  (word finding difficulties in conversation) Pragmatics: No impairment Interfering Components: Attention Effective Techniques: Semantic cues Non-Verbal Means of Communication: Not applicable   Oral / Motor  Oral Motor/Sensory Function Overall Oral Motor/Sensory Function: Within functional limits Motor Speech Overall Motor Speech: Appears within functional limits for tasks assessed Respiration: Within functional limits Phonation: Normal Resonance: Within functional limits Articulation: Within functional limitis Intelligibility: Intelligible Motor Planning: Witnin functional limits Motor Speech Errors: Not applicable   GO                    Metro KungOleksiak, Amy K, MA, CCC-SLP 11/26/2015, 6:46 PM [phone]

## 2015-11-26 NOTE — Procedures (Signed)
ELECTROENCEPHALOGRAM REPORT  Date of Study: 11/26/2015  Patient's Name: David BarlowDavid R Duke MRN: [id number] Date of Birth: [birth year]  Referring Provider: Heron SabinsJose Vega, MD  Indication: 81 year old man with confusion and left parietal-occipital intraparenchymal hemorrhage  Medications: Fioricet Valium Zoloft Hydralazine Flomax  Technical Summary: This is a multichannel digital EEG recording, using the international 10-20 placement system with electrodes applied with paste and impedances below 5000 ohms.    Description: The EEG background is symmetric, with mild slowing and a well-developed posterior dominant rhythm of 7 Hz, which is reactive to eye opening and closing.  Diffuse beta activity is seen, with a bilateral frontal preponderance.  No focal or generalized abnormalities are seen.  No focal or generalized epileptiform discharges are seen.  Stage II sleep is not seen  Hyperventilation and photic stimulation were not performed.  ECG revealed normal cardiac rate and rhythm.  Impression: This is an abnormal EEG due to mild background slowing.  This is a nonspecific finding indicative of mild diffuse cerebral dysfunction, which may be due to a toxic-metabolic, pharmacologic or other diffuse physiologic abnormality.  Adam R. Everlena CooperJaffe, DO

## 2015-11-26 NOTE — Progress Notes (Signed)
Patient ID: David Duke, male   DOB: July 26, 1935, 81 y.o.   MRN: [id number] [facility] PROGRESS NOTE  David Duke WGN:[id number] DOB: 23-Nov-1934 DOA: 11/22/2015  PCP: No primary care provider on file.  Brief narrative:    81 y.o. 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 Patient's wife additionally reported that the patient seemed confused earlier in the day of the admission but has resolved on its own in about 45 minutes.   Assessment/Plan:    Acute encephalopathy - multifactorial, due to acute ICH hemorrhage as well as worsening acute kidney injury likely related to dehydration  - UA unremarkable - mental status better and more clear this AM per family at bedside   Acute ICH hemorrhage within the lower left parietal-occipital lobe, etiology not clear - From location and age, concerning for CAA. However, hypertensive bleed still in DDx  - CT repeat - 12/14/15 - stable, attempted MRI and MRA on 11/25/2015 and again and patient did not tolerate - Carotid Doppler - Bilateral: 1-39% ICA stenosis. Vertebral artery flow is antegrade.  - 2D Echo EF 65-70%. Extremely poor images but no cardiac source of emboli identified - LDL 122 - HgbA1c 5.9  Acute kidney injury - Likely related to dehydration as result of decreased oral intake - continue to provide IVF and repeat BMP in AM - Avoid nephrotoxins - Monitor urine output  Fever and leukocytosis - unclear etiology, currently afebrile and WBC is WNL - if become recurrent problem, initiate FUO work up  Hyponatremia - resolved  Hypokalemia - supplement and repeat BMP in AM  DVT prophylaxis - SCD's  Code Status: Full.  Family Communication:  plan of care discussed with the patient Disposition Plan: CIR vs SNF by 3/22  IV access:  Peripheral IV  Procedures and diagnostic studies:    Ct Head Wo  Contrast December 14, 2015 No new or worsening fighting. Expected evolutionary changes with some contraction of the hyperdense clot in the left posterior temporal and occipital region. Small amount of ventricular blood is the same. No change in ventricular size. No change in mass effect.   Ct Head Wo Contrast 11/24/2015  Stable to slightly decreased left occipital lobe intraparenchymal hemorrhage with extension into the occipital horn of the left lateral ventricle. Stable sulcal effacement in the left parieto-occipital lobe. 2. Stable 4 mm right midline shift.  Basilar cisterns remain patent. 3. Underlying cerebral volume loss and mild chronic small vessel ischemia.  Ct Head Wo Contrast 11/23/2015   Mild enlargement of the left occipital lobe epicenter intra-axial hemorrhage, with estimated blood volume now of 46 mL. Stable surrounding edema and mild regional mass effect. 2. Small volume of intraventricular and subdural extension of blood is stable since yesterday. No ventriculomegaly. 3. No new intracranial abnormality  Ct Head Wo Contrast 11/22/2015   1. Acute intraparenchymal hemorrhage within the lower left parietal-occipital lobe, measuring 5.6 x 2.9 cm, with surrounding edema causing slight mass effect without midline shift or herniation. Hemorrhage extends into the adjacent posterior horn of the left lateral ventricle. Additional thin hemorrhage along the adjacent posterior falx and underlying tentorium. 2. Mild chronic small vessel ischemic change within the periventricular white matter.   Dg Chest Port 1 View 11/25/2015 No edema or consolidation.   Dg Chest Port 1 View 11/24/2015 Shallow inflation. 2.  No evidence for acute cardiopulmonary abnormality.   Medical Consultants:  [facility]  Other Consultants:  PT  IAnti-Infectives:   None  Debbora Presto, MD  [facility] Pager [phone]  If 7PM-7AM, please contact night-coverage [email or web address] Password [facility] [place] 11/26/2015, 3:44 PM   LOS: 4 days    HPI/Subjective: No events overnight.   Objective: Filed Vitals:   11/26/15 1119 11/26/15 1502 11/26/15 1512 11/26/15 1519  BP: 127/70 84/49 83/52  86/36  Pulse: 80   94  Temp: 98.3 F (36.8 C) 97.4 F (36.3 C)    TempSrc: Oral Axillary    Resp: 26   26  Height:      Weight:      SpO2: 97%   94%    Intake/Output Summary (Last 24 hours) at 11/26/15 1544 Last data filed at 11/26/15 0727  Gross per 24 hour  Intake   1480 ml  Output   1825 ml  Net   -345 ml    Exam:   General:  Pt is alert, follows commands appropriately, not in acute distress  Cardiovascular: Regular rate and rhythm, no rubs, no gallops  Respiratory: Clear to auscultation bilaterally, no wheezing, no crackles, no rhonchi  Abdomen: Soft, non tender, non distended, bowel sounds present, no guarding  Extremities: No edema, pulses DP and PT palpable bilaterally  Data Reviewed: Basic Metabolic Panel:  Recent Labs Lab 11/22/15 1720 11/22/15 1732 11/23/15 0720 11/24/15 0523 11/25/15 0511 11/26/15 0536  NA 136 137 139 133* 133* 137  K 4.3 4.2 4.1 4.2 3.5 3.0*  CL 103 102 106 102 103 108  CO2 21*  --  21* 22 17* 20*  GLUCOSE 118* 111* 103* 121* 84 102*  BUN 23* 19  CREATININE 1.33* 1.30* 1.37* 1.36* 1.85* 1.39*  CALCIUM 9.1  --  9.1 8.7* 8.7* 8.0*   Liver Function Tests:  Recent Labs Lab 11/22/15 1720  AST 22  ALT 15*  ALKPHOS 90  BILITOT 0.5  PROT 7.1  ALBUMIN 3.9   CBC:  Recent Labs Lab 11/22/15 1720 11/22/15 1732 11/23/15 0720 11/24/15 0523 11/25/15 0511 11/25/15 1543 11/26/15 0536  WBC 12.8*  --  10.6* 14.0* 15.2*  --  10.5  NEUTROABS 11.3*  --   --   --   --   --  7.9*  HGB 15.5 17.0 14.6 13.3 13.8  --  12.5*  HCT 47.1 50.0 44.1 40.1 40.8 41.1 37.7*  MCV 90.8  --  90.0 89.3 87.0  --  88.5  PLT PLATELETS APPEAR ADEQUATE  --  197 197 211  --  164   CBG:  Recent Labs Lab 11/25/15 1312 11/25/15 1557 11/25/15 2119 11/26/15 0807 11/26/15 1128  GLUCAP  100* 111* 83 86 110*    Recent Results (from the past 240 hour(s))  MRSA PCR Screening     Status: None   Collection Time: 11/22/15 11:19 PM  Result Value Ref Range Status   MRSA by PCR NEGATIVE NEGATIVE Final    Comment:        The GeneXpert MRSA Assay (FDA approved for NASAL specimens only), is one component of a comprehensive MRSA colonization surveillance program. It is not intended to diagnose MRSA infection nor to guide or monitor treatment for MRSA infections.   Culture, blood (routine x 2)     Status: None (Preliminary result)   Collection Time: 11/24/15 12:00 PM  Result Value Ref Range Status   Specimen Description BLOOD RIGHT ANTECUBITAL  Final   Special Requests BOTTLES DRAWN AEROBIC AND ANAEROBIC 10CC  Final   Culture NO GROWTH 2 DAYS  Final  Report Status PENDING  Incomplete  Culture, blood (routine x 2)     Status: None (Preliminary result)   Collection Time: 11/24/15 12:15 PM  Result Value Ref Range Status   Specimen Description BLOOD RIGHT HAND  Final   Special Requests BOTTLES DRAWN AEROBIC AND ANAEROBIC 10CC  Final   Culture NO GROWTH 2 DAYS  Final   Report Status PENDING  Incomplete  Urine culture     Status: None   Collection Time: 11/25/15  3:09 PM  Result Value Ref Range Status   Specimen Description URINE, CATHETERIZED  Final   Special Requests none  Final   Culture NO GROWTH 1 DAY  Final   Report Status 11/26/2015 FINAL  Final     Scheduled Meds: .  stroke: mapping our early stages of recovery book   Does not apply Once  . atorvastatin  20 mg Oral q1800  . diazepam  5 mg Oral Q12H  . pantoprazole  40 mg Oral QHS  . senna-docusate  1 tablet Oral BID  . sertraline  100 mg Oral Daily  . sodium chloride  500 mL Intravenous Once  . tamsulosin  0.4 mg Oral Daily   Continuous Infusions:

## 2015-11-26 NOTE — Progress Notes (Signed)
Pt arrived to 37M at 1901. Welcomed and oriented to unit and equipment. Family present in room with patient. Will give report to oncoming nurse. [name]

## 2015-11-26 NOTE — Progress Notes (Signed)
STROKE TEAM PROGRESS NOTE   HISTORY OF PRESENT ILLNESS David Duke is an 81 y.o. 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 Patient reports that earlier this afternoon he developed some blurry vision in his left eye. He denies any weakness or speech disturbances, numbness, protruding posterior or gait problems. He went to his ophthalmologist's abdominal office to get an exam and was concerned for stroke, so sent the patient here for evaluation. Patient's wife additionally reports that the patient seemed confused earlier in the day today. He denies any recent illnesses including fever, headache, chest pain, shortness of breath, abdominal pain, nausea, vomiting, diarrhea, urinary frequency. No prior history of stroke. He is not on blood thinners. No recent head trauma.   Date last known well: 11/22/2015 Time last known well: Some time this morning, time of onset unknown tPA Given: No: Intracerebral hemorrhage ICH score: 1 Stroke Risk Factors - hypertension   SUBJECTIVE (INTERVAL HISTORY) Still confused, not speaking, repeat CT today stable,   Son and wife at bedside.    OBJECTIVE Temp:  [97.4 F (36.3 C)-98.7 F (37.1 C)] 97.4 F (36.3 C) (03/20 1502) Pulse Rate:  [76-94] 94 (03/20 1519) Cardiac Rhythm:  [-] Normal sinus rhythm (03/20 1119) Resp:  [17-27] 26 (03/20 1519) BP: (83-150)/(36-85) 86/36 mmHg (03/20 1519) SpO2:  [88 %-97 %] 94 % (03/20 1519)  CBC:  Recent Labs Lab 11/22/15 1720  11/25/15 0511 11/25/15 1543 11/26/15 0536  WBC 12.8*  < > 15.2*  --  10.5  NEUTROABS 11.3*  --   --   --  7.9*  HGB 15.5  < > 13.8  --  12.5*  HCT 47.1  < > 40.8 41.1 37.7*  MCV 90.8  < > 87.0  --  88.5  PLT PLATELETS APPEAR ADEQUATE  < > 211  --  164  < > = values in this interval not displayed.  Basic Metabolic Panel:   Recent Labs Lab 11/25/15 0511  11/26/15 0536  NA 133* 137  K 3.5 3.0*  CL 103 108  CO2 17* 20*  GLUCOSE 84 102*  BUN 23* 19  CREATININE 1.85* 1.39*  CALCIUM 8.7* 8.0*    Lipid Panel:     Component Value Date/Time   CHOL 213* 11/22/2015 1839   TRIG 188* 11/22/2015 1839   HDL 53 11/22/2015 1839   CHOLHDL 4.0 11/22/2015 1839   VLDL 38 11/22/2015 1839   LDLCALC 122* 11/22/2015 1839   HgbA1c:  Lab Results  Component Value Date   HGBA1C 5.9* 11/23/2015   Urine Drug Screen:     Component Value Date/Time   LABOPIA NONE DETECTED 11/22/2015 1841   COCAINSCRNUR NONE DETECTED 11/22/2015 1841   LABBENZ POSITIVE* 11/22/2015 1841   AMPHETMU NONE DETECTED 11/22/2015 1841   THCU NONE DETECTED 11/22/2015 1841   LABBARB NONE DETECTED 11/22/2015 1841      IMAGING I have personally reviewed the radiological images below and agree with the radiology interpretations.    Ct Head Wo Contrast 11/24/2015   1. Stable to slightly decreased left occipital lobe intraparenchymal hemorrhage with extension into the occipital horn of the left lateral ventricle. Stable sulcal effacement in the left parieto-occipital lobe. 2. Stable 4 mm right midline shift. Basilar cisterns remain patent. 3. Underlying cerebral volume loss and mild chronic small vessel ischemia.  Ct Head Wo Contrast 11/23/2015 1. Mild enlargement of the left occipital lobe epicenter intra-axial  hemorrhage, with estimated blood volume now of 46 mL. Stable surrounding edema and mild regional mass effect. 2. Small volume of intraventricular and subdural extension of blood is stable since yesterday. No ventriculomegaly. 3. No new intracranial abnormality identified.   Ct Head Wo Contrast 11/22/2015   1. Acute intraparenchymal hemorrhage within the lower left parietal-occipital lobe, measuring 5.6 x 2.9 cm, with surrounding edema causing slight mass effect without midline shift or herniation. Hemorrhage extends into the adjacent posterior horn of the left lateral  ventricle. Additional thin hemorrhage along the adjacent posterior falx and underlying tentorium.  2. Mild chronic small vessel ischemic change within the periventricular white matter.    Ct Head Wo Contrast 11/23/2015 1. Mild enlargement of the left occipital lobe epicenter intra-axial hemorrhage, with estimated blood volume now of 46 mL. Stable surrounding edema and mild regional mass effect. 2. Small volume of intraventricular and subdural extension of blood is stable since yesterday. No ventriculomegaly. 3. No new intracranial abnormality identified.  TTE  Study Conclusions - Left ventricle: The cavity size was normal. Wall thickness was  normal. Systolic function was vigorous. The estimated ejection  fraction was in the range of 65% to 70%. Wall motion was normal;  there were no regional wall motion abnormalities. Doppler  parameters are consistent with abnormal left ventricular  relaxation (grade 1 diastolic dysfunction). - Left atrium: The atrium was mildly dilated. Impressions: - Extremely limited due to poor sound wave transmission; vigorous  LV function; grade 1 diastolic dysfunction; mild LAE.   CUS  Bilateral: 1-39% ICA stenosis. Vertebral artery flow is antegrade.   PHYSICAL EXAM  Temp:  [97.4 F (36.3 C)-98.7 F (37.1 C)] 97.4 F (36.3 C) (03/20 1502) Pulse Rate:  [76-94] 94 (03/20 1519) Resp:  [17-27] 26 (03/20 1519) BP: (83-150)/(36-85) 86/36 mmHg (03/20 1519) SpO2:  [88 %-97 %] 94 % (03/20 1519)  General - Well nourished, well developed elderly Caucasian male not in distress  Ophthalmologic - Fundi not visualized due to noncooperation.  Cardiovascular - Regular rate and rhythm..  Mental Status -  Patient is  Awake alertt following commands,    Cranial Nerves II - XII - II -  Right eye pupil surgical, left round and reactive.  III, IV, VI - Extraocular movements intact. Right eye poor vision acuity left eye nasal field cut V - Facial  sensation intact bilaterally. VII - Facial movement intact bilaterally. VIII - Hearing impaired  X - Palate elevates symmetrically. XI -  shoulder shrug intact bilaterally. XII - Tongue protrusion intact.  Motor Strength - Difficul to fully assess due to confusion, but appears all limbs are antigravity and equal.   Bulk was normal and fasciculations were absent.   Motor Tone - Muscle tone was assessed at the neck and appendages and was normal.  Reflexes - The patient's reflexes were 1+ in all extremities and he had no pathological reflexes.  Sensory - Deifficult to assess due to confusion, moved all limbs to stim.  Coordination - The patient had normal movements in the hands with no apparent ataxia or dysmetria.  Tremor was absent.  Toes: downgoing bilat  Gait and Station - can stand unassisted. However gait not tested due to safety concerns    ASSESSMENT/PLAN David Duke is a 81 y.o. male with history of hyperlipidemia, hypertension, depression, anxiety, blind in the right eye, and COPD presenting with visual changes and confusion. He did not receive IV t-PA due to intracerebral hemorrhage.  Stroke:  Acute ICH hemorrhage  within the lower left parietal-occipital lobe, etiology not clear. From location and age, concerning for CAA. However, hypertensive bleed still in DDx although son stated pt has no hx of HTN at home.  Resultant  HA and agitation.  Elevated temperature, elevated WBCs, AKI, hyponatremia. Will increase fluids. Internal medicine consult.   MRI  deferred - patient is claustrophobic and not cooperative  MRA  Deferred - patient is claustrophobic and not cooperative  CTA deferred due to elevated Cre  CT repeat - 11/26/2015 - stable. Attempted MRI and MRA on 11/25/2015 and again and patient will not tolerate even less increased diazepam.  Carotid Doppler - Bilateral: 1-39% ICA stenosis. Vertebral artery flow is antegrade.   2D Echo  EF 65-70%. Extremely  poor images but no cardiac source of emboli identified.  LDL 122  HgbA1c 5.9  VTE prophylaxis - SCDs Diet regular Room service appropriate?: Yes; Fluid consistency:: Thin  No antithrombotic prior to admission, now on No antithrombotic secondary to hemorrhage  Ongoing aggressive stroke risk factor management  Therapy recommendations:  Pending (patient still on bedrest secondary to confusion)  Disposition:  Pending  Hypertension  Stable   Relaxation encouraged   Put back home meds  BP goal < 160  Son denies any hx of HTN  Hyperlipidemia  Home meds:  No lipid lowering medications prior to admission.  LDL 122, goal < 70  Add statin 57m daily  Continue statin at discharge  Other Stroke Risk Factors  Advanced age  Cigarette smoker, advised to stop smoking  Other Active Problems  Anemia  Mildly elevated creatinine  Anxiety - back on valium. Per wife patient takes it possibly 3 times a week at most.  Tobacco withdrawal: We've reports he is not a heavy smoker and this is likely tobacco withdrawal.  Leukocytosis - afebrile - UA (not consistent with UTI) - blood cultures pending - chest x-ray - negative  Headaches - Fioricet  CBC and BMP met on Monday  Appreciate internal medicine recommendations.   Hospital day # 433 81year old male who presented with an acute intraparenchymal hemorrhage in the left parietal occipital lobe. Repeat CT scans have been stable. Patient may have some early dementia at baseline.  Still confused, not speaking, repeat CT yesterday stable, WBCs elevated and febrile. CXR neg, UA without infection, spitting out his medications.  No diarrhea. Elevated creatinine. Will increase fluids. Could not tolerate MRi of the brain again today even with 165mdiazepam. Could not tolerate EEG. Appreciate internal medicine recommendations. Discussed with patient and family and answered questions  Personally examined patient and images, and have  participated in and made any corrections needed to history, physical, neuro exam,assessment and plan as stated above.  I have personally obtained the history, evaluated lab date, reviewed imaging studies and agree with radiology interpretations.    PrAntony ContrasMD Neurology [phone]    To contact Stroke Continuity provider, please refer to [email or web address], contact General Neurology

## 2015-11-26 NOTE — Progress Notes (Signed)
OT Cancellation Note  Patient Details Name: Marcell BarlowDavid R Cowdrey MRN: [id number] DOB: 01/07/1935   Cancelled Treatment:    Reason Eval/Treat Not Completed: Patient not medically ready (bedrest)   OT will complete evaluation at next appropriate time when bedrest order set removed.    Felecia ShellingJones, Krystian Younglove B   [place]olton Denis, Brynn   OTR/L Pager: [phone] [phone] .  11/26/2015, 2:30 PM

## 2015-11-26 NOTE — Evaluation (Signed)
Physical Therapy Evaluation Patient Details Name: David Duke MRN: [id number] DOB: Jul 22, 1935 Today's Date: 11/26/2015   History of Present Illness  Pt is a 81 y/o M w/ confusion and Lt parietal-occipital intraparenchymal hemorrhage. Pt's PMH includes depression, anxiety, macular degeneration Bil eyes, blind Rt eye, COPD.    Clinical Impression  Pt admitted with above diagnosis. Pt currently with functional limitations due to the deficits listed below (see PT Problem List). David Duke presents w/ impaired cognition and balance.  His BP dropped to 83/52 w/ sit>stand, thus limiting PT evaluation.  Per pt he lives w/ his wife at home.  He currently requires +2 assist for safe transfers and bed mobility. Pt will benefit from skilled PT to increase their independence and safety with mobility to allow discharge to the venue listed below.      Follow Up Recommendations CIR;Supervision/Assistance - 24 hour    Equipment Recommendations  Other (comment) (TBD)    Recommendations for Other Services OT consult;Rehab consult     Precautions / Restrictions Precautions Precautions: Fall Precaution Comments: check BP Restrictions Weight Bearing Restrictions: No      Mobility  Bed Mobility Overal bed mobility: Needs Assistance;+2 for physical assistance Bed Mobility: Supine to Sit;Sit to Supine     Supine to sit: Min assist Sit to supine: Min assist;+2 for physical assistance   General bed mobility comments: Simple commands to have pt sit EOB.  Requires assist to elevate trunk and, when returning to supine, assist managing LEs  Transfers Overall transfer level: Needs assistance Equipment used: 1 person hand held assist Transfers: Sit to/from Stand Sit to Stand: Min assist;+2 physical assistance;+2 safety/equipment         General transfer comment: Assist to steady w/ 1 person HHA to stand. Pt stand ~15 seconds and then begins to sit.  Once sitting pt asked if he felt dizzy and  he replied, "no, light".  BP 83/52.  Ambulation/Gait             General Gait Details: no able to assess  Stairs            Wheelchair Mobility    Modified Rankin (Stroke Patients Only)       Balance Overall balance assessment: Needs assistance Sitting-balance support: Bilateral upper extremity supported;Feet supported Sitting balance-Leahy Scale: Fair     Standing balance support: During functional activity;Single extremity supported Standing balance-Leahy Scale: Poor Standing balance comment: Relies on UE support                             Pertinent Vitals/Pain Pain Assessment: Faces Faces Pain Scale: No hurt Pain Intervention(s): Limited activity within patient's tolerance;Monitored during session    Home Living Family/patient expects to be discharged to:: Private residence Living Arrangements: Spouse/significant other   Type of Home: House       Home Layout: One level   Additional Comments: Pt is a poor historian and is unable to confirm information regarding PLOF and home living.      Prior Function           Comments: Pt is a poor historian and is unable to confirm information regarding PLOF and home living.       Hand Dominance        Extremity/Trunk Assessment   Upper Extremity Assessment: Defer to OT evaluation           Lower Extremity Assessment: RLE deficits/detail;LLE deficits/detail RLE Deficits / Details:  not formally tested but pt able to stand EOB w/ 1 person HHA and able to perform SLR LLE Deficits / Details: not formally tested but pt able to stand EOB w/ 1 person HHA and able to perform SLR  Cervical / Trunk Assessment: Kyphotic  Communication   Communication: No difficulties  Cognition Arousal/Alertness: Awake/alert Behavior During Therapy: WFL for tasks assessed/performed Overall Cognitive Status: No family/caregiver present to determine baseline cognitive functioning Area of Impairment:  Orientation;Attention;Memory;Following commands;Safety/judgement;Awareness;Problem solving Orientation Level: Disoriented to;Situation;Time Current Attention Level: Sustained Memory: Decreased short-term memory Following Commands: Follows one step commands inconsistently;Follows one step commands with increased time Safety/Judgement: Decreased awareness of deficits;Decreased awareness of safety Awareness: Intellectual Problem Solving: Slow processing;Difficulty sequencing;Requires verbal cues General Comments: Reported it as March 13th and could not recall why he was in the hospital.  Had difficulty staying on task.    General Comments      Exercises        Assessment/Plan    PT Assessment Patient needs continued PT services  PT Diagnosis Difficulty walking   PT Problem List Decreased strength;Decreased activity tolerance;Decreased balance;Decreased mobility;Decreased cognition;Decreased knowledge of use of DME;Decreased safety awareness  PT Treatment Interventions DME instruction;Gait training;Functional mobility training;Therapeutic activities;Therapeutic exercise;Balance training;Neuromuscular re-education;Cognitive remediation;Patient/family education   PT Goals (Current goals can be found in the Care Plan section) Acute Rehab PT Goals Patient Stated Goal: none stated PT Goal Formulation: Patient unable to participate in goal setting Time For Goal Achievement: 12/17/15 Potential to Achieve Goals: Good    Frequency Min 3X/week   Barriers to discharge   Unsure of pt's home living situation    Co-evaluation               End of Session Equipment Utilized During Treatment: Gait belt Activity Tolerance: Treatment limited secondary to medical complications (Comment) (drop in BP) Patient left: in bed;with call bell/phone within reach;with bed alarm set Nurse Communication: Mobility status;Other (comment) (BP)         Time: [id number] PT Time Calculation (min)  (ACUTE ONLY): 18 min   Charges:   PT Evaluation $PT Eval High Complexity: 1 Procedure     PT G Codes:       Encarnacion ChuAshley Azeem Poorman PT, DPT  Pager: [phone] Phone: [phone] 11/26/2015, 4:24 PM

## 2015-11-26 NOTE — Care Management Important Message (Signed)
Important Message  Patient Details  Name: David BarlowDavid R Duke MRN: [id number] Date of Birth: 06/16/1935   Medicare Important Message Given:  Yes    Temima Kutsch P Khylee Algeo 11/26/2015, 3:38 PM

## 2015-11-26 NOTE — Progress Notes (Signed)
Pt oriented x2, self, place, with improved cognition and speech.  Improved NIH, mitts removed, taking pills whole.  Wife at bedside.  Will continue to monitor.

## 2015-11-26 NOTE — Progress Notes (Signed)
PRN Neb treatment given, pt is stable at this time no distress or complication noted. Expiratory wheeze bilateral breath sounds noted. Decrease aeration in the bases. Family at bedside

## 2015-11-26 NOTE — Progress Notes (Signed)
Bedside EEG completed, results pending. 

## 2015-11-27 DIAGNOSIS — I610 Nontraumatic intracerebral hemorrhage in hemisphere, subcortical: Secondary | ICD-10-CM

## 2015-11-27 DIAGNOSIS — F329 Major depressive disorder, single episode, unspecified: Secondary | ICD-10-CM

## 2015-11-27 DIAGNOSIS — J449 Chronic obstructive pulmonary disease, unspecified: Secondary | ICD-10-CM

## 2015-11-27 DIAGNOSIS — D649 Anemia, unspecified: Secondary | ICD-10-CM | POA: Insufficient documentation

## 2015-11-27 DIAGNOSIS — N179 Acute kidney failure, unspecified: Secondary | ICD-10-CM

## 2015-11-27 DIAGNOSIS — R4701 Aphasia: Secondary | ICD-10-CM

## 2015-11-27 DIAGNOSIS — H3530 Unspecified macular degeneration: Secondary | ICD-10-CM

## 2015-11-27 DIAGNOSIS — E876 Hypokalemia: Secondary | ICD-10-CM

## 2015-11-27 DIAGNOSIS — R7303 Prediabetes: Secondary | ICD-10-CM | POA: Insufficient documentation

## 2015-11-27 LAB — LACTIC ACID, PLASMA: Lactic Acid, Venous: 1.4 mmol/L (ref 0.5–2.0)

## 2015-11-27 LAB — BASIC METABOLIC PANEL
Anion gap: 12 (ref 5–15)
BUN: 14 mg/dL (ref 6–20)
CHLORIDE: 103 mmol/L (ref 101–111)
CO2: 24 mmol/L (ref 22–32)
CREATININE: 1.41 mg/dL — AB (ref 0.61–1.24)
Calcium: 8.3 mg/dL — ABNORMAL LOW (ref 8.9–10.3)
GFR calc non Af Amer: 46 mL/min — ABNORMAL LOW (ref >60)
GFR, EST AFRICAN AMERICAN: 54 mL/min — AB (ref >60)
GLUCOSE: 109 mg/dL — AB (ref 65–99)
Potassium: 3.2 mmol/L — ABNORMAL LOW (ref 3.5–5.1)
Sodium: 139 mmol/L (ref 135–145)

## 2015-11-27 LAB — CBC
HEMATOCRIT: 37.4 % — AB (ref 39.0–52.0)
HEMOGLOBIN: 12.3 g/dL — AB (ref 13.0–17.0)
MCH: 29.3 pg (ref 26.0–34.0)
MCHC: 32.9 g/dL (ref 30.0–36.0)
MCV: 89 fL (ref 78.0–100.0)
Platelets: 177 10*3/uL (ref 150–400)
RBC: 4.2 MIL/uL — ABNORMAL LOW (ref 4.22–5.81)
RDW: 13.1 % (ref 11.5–15.5)
WBC: 9.5 10*3/uL (ref 4.0–10.5)

## 2015-11-27 LAB — GLUCOSE, CAPILLARY
GLUCOSE-CAPILLARY: 107 mg/dL — AB (ref 65–99)
GLUCOSE-CAPILLARY: 124 mg/dL — AB (ref 65–99)
GLUCOSE-CAPILLARY: 116 mg/dL — AB (ref 65–99)

## 2015-11-27 MED ORDER — POTASSIUM CHLORIDE CRYS ER 20 MEQ PO TBCR
40.0000 meq | EXTENDED_RELEASE_TABLET | Freq: Once | ORAL | Status: AC
Start: 2015-11-27 — End: 2015-11-27
  Administered 2015-11-27: 40 meq via ORAL
  Filled 2015-11-27: qty 2

## 2015-11-27 MED ORDER — ALBUTEROL SULFATE (2.5 MG/3ML) 0.083% IN NEBU
2.5000 mg | INHALATION_SOLUTION | Freq: Two times a day (BID) | RESPIRATORY_TRACT | Status: DC
  Administered 2015-11-28: 2.5 mg via RESPIRATORY_TRACT
  Filled 2015-11-27 (×2): qty 3

## 2015-11-27 NOTE — Care Management Note (Signed)
Case Management Note  Patient Details  Name: David BarlowDavid R Geron MRN: [id number] Date of Birth: 03/29/1935  Subjective/Objective:                    Action/Plan: Patient was admitted with CVA.  Current therapy recommendations are for CIR.  CSW following for possible SNF placement. CM will continue to follow for discharge needs pending disposition.  Expected Discharge Date:                  Expected Discharge Plan:     In-House Referral:     Discharge planning Services     Post Acute Care Choice:    Choice offered to:     DME Arranged:    DME Agency:     HH Arranged:    HH Agency:     Status of Service:  In process, will continue to follow  Medicare Important Message Given:  Yes Date Medicare IM Given:    Medicare IM give by:    Date Additional Medicare IM Given:    Additional Medicare Important Message give by:     If discussed at Long Length of Stay Meetings, dates discussed:    Additional Comments:  Anda KraftRobarge, Maiah Sinning C, RN 11/27/2015, 10:54 AM [phone]

## 2015-11-27 NOTE — Clinical Social Work Note (Signed)
BSW intern has spoken with patient's son, Chapman MossDavid Jr by phone to present bed offers. At this time, patient's son would like to visit a few of the facilities before making a final decision. Patient's son has also expressed interest in [facility]. BSW intern to contact admissions coordinator in reference to possibly extending a bed offer. BSW intern to f/u with patient's son regarding facility placement at a later time. Patient's son informed BSW intern that patient's scheduled d/c is unknown as of right now.   BSW intern remains available.  Orson Gearatiana Floreen Teegarden- BSW intern [phone]

## 2015-11-27 NOTE — Clinical Documentation Improvement (Signed)
Internal Medicine  Can the diagnosis of encephalopathy be further specified in progress notes and discharge summary?   Metabolic encephalopathy  Other  Clinically Undetermined  Document any associated diagnoses/conditions.   Supporting Information: 81 year old male admitted with ICH  3/19 Consult notes: #1. Acute encephalopathy. His son and RN so state that he was alert and very coherent and was able to eat a small amount about 1 hr ago. Per son, he is still relatively confused and was more oriented on the day he arrived to the ER and has steadily become confused. Patient was mildly confused per family prior to admission the symptoms for presentation. Etiology unclear. May be multifactorial. Acute ICH hemorrhage as well as worsening acute kidney injury likely related to dehydration and mild hyponatremia. Also concern for infectious process given worsening leukocytosis and fever. O In addition home medications include Valium 5 mg 3 times a day as needed. Repeat head ct unremarkable. patient has been receiving some benzos over the last 3 days. Improved slightly this afternoon after receiving 10 mg of Valium IV in preparation for neuro imaging may need to repeat chest x-ray after hydration if he becomes hypoxic to ensure no appearance of infiltrate - urinalysis negative -Repeat MRI per neurology -Obtain folate and B-12, RPR -he has been receiving Valium 5 mg BID over the past couple of days cont every 12 hours -incentive spirometry -500 cc bolus of normal saline -Increase IV fluids- he was not eating for 2 days and IVF were at 50 cc/hr -tylenol for fever  3/20 progress note Acute encephalopathy - multifactorial, due to acute ICH hemorrhage as well as worsening acute kidney injury likely related to dehydration  - UA unremarkable - mental status better and more clear this AM per family at bedside   3/21 progress note Acute encephalopathy - multifactorial, due to acute ICH  hemorrhage as well as worsening acute kidney injury likely related to dehydration  - UA unremarkable - mental status clear this AM - would think pt is OK to go to inpatient rehab if primary team agrees, pt is in agreement with plan    Please exercise your independent, professional judgment when responding. A specific answer is not anticipated or expected.   Thank You,  Harless Littenebora T Piccola [facility] [facility] [place] [phone]

## 2015-11-27 NOTE — Consult Note (Signed)
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 Patient lives with his wife reported to be independent prior to admission. One level home with multiple steps to entry. Presented 11/22/2015 with blurred vision left eye as well as altered mental status reported by his wife. Blood pressure 145/98. Cranial CT scan showed acute intraparenchymal hemorrhage within the left lower parietal occipital lobe measuring 5.6 x 2.9 cm with surrounding edema causing slight mass effect without midline shift. Echocardiogram with ejection fraction of 70% grade 1 diastolic dysfunction. EEG nonspecific finding indicating of mild diffuse cerebral dysfunction no seizure activity. Carotid Dopplers with no ICA stenosis. Patient did not receive TPA due to intracranial hemorrhage. Neurology consulted MRI of the brain is pending. Close monitoring of blood pressure. Maintain on a regular diet. Physical therapy evaluation completed 11/26/2015 with recommendations of physical medicine rehabilitation consult.  Review of Systems  Constitutional: Negative for fever and chills.  HENT: Negative for hearing loss.   Eyes: Positive for blurred vision.  Respiratory: Negative for cough.        Shortness of breath with exertion  Cardiovascular: Negative for chest pain, palpitations and leg swelling.  Gastrointestinal: Positive for constipation. Negative for nausea and vomiting.  Genitourinary: Positive for urgency. Negative for dysuria and hematuria.  Musculoskeletal: Positive for myalgias and joint pain.  Skin: Negative for rash.  Neurological: Negative for seizures and headaches.  Psychiatric/Behavioral: Positive for depression.       Anxiety  All other systems reviewed and are negative.  Past Medical History  Diagnosis Date    . High cholesterol   . Depression   . Anxiety   . Macular degeneration of both eyes   . Blind right eye   . COPD (chronic obstructive pulmonary disease) (HCC)   . Shortness of breath dyspnea    Past Surgical History  Procedure Laterality Date  . No past surgeries     Family History  Problem Relation Age of Onset  . Colon cancer Mother   . Cerebral aneurysm Father    Social History:  reports that he has been smoking Cigarettes.  He has a 30 pack-year smoking history. He does not have any smokeless tobacco history on file. He reports that he does not drink alcohol or use illicit drugs. Allergies: No Known Allergies Medications Prior to Admission  Medication Sig Dispense Refill  . albuterol (PROVENTIL HFA;VENTOLIN HFA) 108 (90 Base) MCG/ACT inhaler Inhale 2 puffs into the lungs every 6 (six) hours as needed for wheezing or shortness of breath.    . calcium-vitamin D (OSCAL WITH D) 500-200 MG-UNIT tablet Take 1 tablet by mouth daily with breakfast.    . diazepam (VALIUM) 5 MG tablet Take 5 mg by mouth 3 (three) times daily as needed for anxiety.    . Omega-3 Fatty Acids (FISH OIL) 1000 MG CAPS Take 1,000 mg by mouth daily.    . promethazine-codeine (PHENERGAN WITH CODEINE) 6.25-10 MG/5ML syrup Take 5 mLs by mouth every 6 (six) hours as needed for cough.    . sertraline (ZOLOFT) 100 MG tablet Take 100 mg by mouth daily.    . silodosin (RAPAFLO) 8 MG CAPS capsule Take 8 mg by mouth daily with breakfast.      Home: Home Living Family/patient expects to be discharged to:: Private residence Living Arrangements: Spouse/significant other  Available Help at Discharge: Family Type of Home: House Home Layout: One level Additional Comments: Pt is a poor historian and is unable to confirm information regarding PLOF and home living.    Functional History: Prior Function Comments: Pt is a poor historian and is unable to confirm information regarding PLOF and home living.   Functional Status:   Mobility: Bed Mobility Overal bed mobility: Needs Assistance, +2 for physical assistance Bed Mobility: Supine to Sit, Sit to Supine Supine to sit: Min assist Sit to supine: Min assist, +2 for physical assistance General bed mobility comments: Simple commands to have pt sit EOB.  Requires assist to elevate trunk and, when returning to supine, assist managing LEs Transfers Overall transfer level: Needs assistance Equipment used: 1 person hand held assist Transfers: Sit to/from Stand Sit to Stand: Min assist, +2 physical assistance, +2 safety/equipment General transfer comment: Assist to steady w/ 1 person HHA to stand. Pt stand ~15 seconds and then begins to sit.  Once sitting pt asked if he felt dizzy and he replied, "no, light".  BP 83/52. Ambulation/Gait General Gait Details: no able to assess    ADL:    Cognition: Cognition Overall Cognitive Status: Impaired/Different from baseline Arousal/Alertness: Awake/alert Orientation Level: Oriented to person, Oriented to place, Oriented to situation, Disoriented to time Attention: Focused, Sustained, Selective Focused Attention: Appears intact Sustained Attention: Appears intact Selective Attention: Impaired Selective Attention Impairment: Verbal basic Memory: Impaired Memory Impairment: Decreased recall of new information Awareness: Appears intact Executive Function: Sequencing, Organizing Sequencing: Impaired Sequencing Impairment: Verbal basic Organizing: Impaired Organizing Impairment: Verbal basic Behaviors: Other (comment) (tangential) Cognition Arousal/Alertness: Awake/alert Behavior During Therapy: WFL for tasks assessed/performed Overall Cognitive Status: Impaired/Different from baseline Area of Impairment: Orientation, Attention, Memory, Following commands, Safety/judgement, Awareness, Problem solving Orientation Level: Disoriented to, Situation, Time Current Attention Level: Sustained Memory: Decreased short-term  memory Following Commands: Follows one step commands inconsistently, Follows one step commands with increased time Safety/Judgement: Decreased awareness of deficits, Decreased awareness of safety Awareness: Intellectual Problem Solving: Slow processing, Difficulty sequencing, Requires verbal cues General Comments: Reported it as March 13th and could not recall why he was in the hospital.  Had difficulty staying on task.  Blood pressure 138/69, pulse 79, temperature 99.6 F (37.6 C), temperature source Oral, resp. rate 18, height 5\' 7"  (1.702 m), weight 64.1 kg (141 lb 5 oz), SpO2 95 %. Physical Exam  Vitals reviewed. Constitutional: He appears well-developed and well-nourished.  HENT:  Head: Normocephalic and atraumatic.  Eyes: Conjunctivae and EOM are normal.  Right eye sluggish to light  Neck: Normal range of motion. Neck supple. No thyromegaly present.  Cardiovascular: Normal rate and regular rhythm.   Respiratory: Effort normal and breath sounds normal. No respiratory distress.  GI: Soft. Bowel sounds are normal. He exhibits no distension.  Musculoskeletal: He exhibits no edema or tenderness.  Neurological: He is alert.  Makes good eye contact with examiner.  Patient does appear to be mildly anxious.  Follows simple commands A&Ox1 Sensation intact to light touch 3+ DTRs RUE/RLE Motor: B/l UE: 4+/5 proximal to distal B/l LE 5/5 proximal to distal  Skin: Skin is warm and dry.  Psychiatric: His speech is normal. He is slowed. Cognition and memory are impaired.    Results for orders placed or performed during the hospital encounter of 11/22/15 (from the past 24 hour(s))  Glucose, capillary     Status: None   Collection Time: 11/26/15  8:07 AM  Result Value Ref Range   Glucose-Capillary  86 65 - 99 mg/dL   Comment 1 Notify RN    Comment 2 Document in Chart   Glucose, capillary     Status: Abnormal   Collection Time: 11/26/15 11:28 AM  Result Value Ref Range    Glucose-Capillary 110 (H) 65 - 99 mg/dL   Comment 1 Notify RN    Comment 2 Document in Chart   Glucose, capillary     Status: Abnormal   Collection Time: 11/26/15  3:20 PM  Result Value Ref Range   Glucose-Capillary 176 (H) 65 - 99 mg/dL  Glucose, capillary     Status: Abnormal   Collection Time: 11/26/15 10:08 PM  Result Value Ref Range   Glucose-Capillary 112 (H) 65 - 99 mg/dL   Comment 1 Notify RN    Comment 2 Document in Chart    Ct Head Wo Contrast  11/26/2015  CLINICAL DATA:  Followup intracranial hemorrhage EXAM: CT HEAD WITHOUT CONTRAST TECHNIQUE: Contiguous axial images were obtained from the base of the skull through the vertex without intravenous contrast. COMPARISON:  11/08/2015 FINDINGS: No worsening or progressive change since the previous study. Some contraction of the hyperdense o'clock in the left posterior temporal/occipital region measuring maximally 5.5 x 2.5 cm today. Surrounding edema unchanged. Small amount of blood in the atrium and occipital horn of the left lateral ventricle unchanged. No change in ventricular size. Right-to-left shift measures 3 mm. No new abnormality. IMPRESSION: No new or worsening fighting. Expected evolutionary changes with some contraction of the hyperdense clot in the left posterior temporal and occipital region. Small amount of ventricular blood is the same. No change in ventricular size. No change in mass effect. Electronically Signed   By: Paulina Fusi M.D.   On: 11/26/2015 08:49   Dg Chest Port 1 View  11/25/2015  CLINICAL DATA:  Altered mental status with fever EXAM: PORTABLE CHEST 1 VIEW COMPARISON:  November 24, 2015 FINDINGS: There is no edema or consolidation. Heart size and pulmonary vascularity are normal. No adenopathy. No bone lesions. IMPRESSION: No edema or consolidation. Electronically Signed   By: Bretta Bang III M.D.   On: 11/25/2015 15:59    Assessment/Plan: Diagnosis: left ICH Labs and images independently reviewed.   Records reviewed and summated above. Stroke: Continue secondary stroke prophylaxis and Risk Factor Modification listed below:   Blood Pressure Management:  Continue current medication with prn's with permisive HTN per primary team  1. Does the need for close, 24 hr/day medical supervision in concert with the patient's rehab needs make it unreasonable for this patient to be served in a less intensive setting? Potentially  2. Co-Morbidities requiring supervision/potential complications:  Encephalopathy (cont to orient and avoid cognitively altering meds), macular degeneration and subsequent blindness right eye, COPD/tobacco abuse (cont to monitor RR and O2 sats with increased activity, cont to counsel), Pre-diabetes (Monitor in accordance with exercise and adjust meds as necessary), hypokalemia (cont to monitor, replete as necessary), AKI (avoid nephrotoxic meds), anemia (transfuse if necessary to ensure appropriate perfusion for increased activity tolerance), depression (ensure mood does not limit functional progress) 3. Due to safety, disease management and patient education, does the patient require 24 hr/day rehab nursing? Potentially 4. Does the patient require coordinated care of a physician, rehab nurse, PT (1-2 hrs/day, 5 days/week), OT (1-2 hrs/day, 5 days/week) and SLP (1-2 hrs/day, 5 days/week) to address physical and functional deficits in the context of the above medical diagnosis(es)? Potentially Addressing deficits in the following areas: balance, endurance, locomotion, strength, transferring, toileting,  cognition and psychosocial support 5. Can the patient actively participate in an intensive therapy program of at least 3 hrs of therapy per day at least 5 days per week? Yes 6. The potential for patient to make measurable gains while on inpatient rehab is good 7. Anticipated functional outcomes upon discharge from inpatient rehab are modified independent  with PT, modified independent with  OT, modified independent with SLP. 8. Estimated rehab length of stay to reach the above functional goals is: 10-14 days. 9. Does the patient have adequate social supports and living environment to accommodate these discharge functional goals? Yes 10. Anticipated D/C setting: Home 11. Anticipated post D/C treatments: HH therapy and Home excercise program 12. Overall Rehab/Functional Prognosis: excellent  RECOMMENDATIONS: This patient's condition is appropriate for continued rehabilitative care in the following setting: CIR after completion of medical workup  Patient has agreed to participate in recommended program. Yes Note that insurance prior authorization may be required for reimbursement for recommended care.  Comment: Rehab Admissions Coordinator to follow up.  Maryla Morrow, MD 11/27/2015

## 2015-11-27 NOTE — Progress Notes (Signed)
STROKE TEAM PROGRESS NOTE   HISTORY OF PRESENT ILLNESS David Duke is an 81 y.o. 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 Patient reports that earlier this afternoon he developed some blurry vision in his left eye. He denies any weakness or speech disturbances, numbness, protruding posterior or gait problems. He went to his ophthalmologist's abdominal office to get an exam and was concerned for stroke, so sent the patient here for evaluation. Patient's wife additionally reports that the patient seemed confused earlier in the day today. He denies any recent illnesses including fever, headache, chest pain, shortness of breath, abdominal pain, nausea, vomiting, diarrhea, urinary frequency. No prior history of stroke. He is not on blood thinners. No recent head trauma.   Date last known well: 11/22/2015 Time last known well: Some time this morning, time of onset unknown tPA Given: No: Intracerebral hemorrhage ICH score: 1 Stroke Risk Factors - hypertension   SUBJECTIVE (INTERVAL HISTORY) The patient's wife and son are at the bedside. The patient is no longer agitated and is able to follow commands. Dr. Pearlean Brownie discussed the possibility of an open MRI after discharge due to the patient's severe claustrophobia.    OBJECTIVE Temp:  [98.2 F (36.8 C)-99.6 F (37.6 C)] 99.5 F (37.5 C) (03/21 1321) Pulse Rate:  [74-86] 79 (03/21 1321) Cardiac Rhythm:  [-] Normal sinus rhythm (03/21 0758) Resp:  [18-20] 20 (03/21 1321) BP: (112-138)/(57-70) 121/70 mmHg (03/21 1321) SpO2:  [95 %-99 %] 96 % (03/21 1321)  CBC:  Recent Labs Lab 11/22/15 1720  11/26/15 0536 11/27/15 0615  WBC 12.8*  < > 10.5 9.5  NEUTROABS 11.3*  --  7.9*  --   HGB 15.5  < > 12.5* 12.3*  HCT 47.1  < > 37.7* 37.4*  MCV 90.8  < > 88.5 89.0  PLT PLATELETS APPEAR ADEQUATE  < > 164 177  < > = values in  this interval not displayed.  Basic Metabolic Panel:   Recent Labs Lab 11/26/15 0536 11/27/15 0615  NA 137 139  K 3.0* 3.2*  CL 108 103  CO2 20* 24  GLUCOSE 102* 109*  BUN 19 14  CREATININE 1.39* 1.41*  CALCIUM 8.0* 8.3*    Lipid Panel:     Component Value Date/Time   CHOL 213* 11/22/2015 1839   TRIG 188* 11/22/2015 1839   HDL 53 11/22/2015 1839   CHOLHDL 4.0 11/22/2015 1839   VLDL 38 11/22/2015 1839   LDLCALC 122* 11/22/2015 1839   HgbA1c:  Lab Results  Component Value Date   HGBA1C 5.9* 11/23/2015   Urine Drug Screen:     Component Value Date/Time   LABOPIA NONE DETECTED 11/22/2015 1841   COCAINSCRNUR NONE DETECTED 11/22/2015 1841   LABBENZ POSITIVE* 11/22/2015 1841   AMPHETMU NONE DETECTED 11/22/2015 1841   THCU NONE DETECTED 11/22/2015 1841   LABBARB NONE DETECTED 11/22/2015 1841      IMAGING I have personally reviewed the radiological images below and agree with the radiology interpretations.   Ct Head Wo Contrast 11/26/2015   No new or worsening fighting. Expected evolutionary changes with some contraction of the hyperdense clot in the left posterior temporal and occipital region. Small amount of ventricular blood is the same. No change in ventricular size. No change in mass effect.   Ct Head Wo Contrast 11/24/2015   1. Stable to slightly decreased left occipital lobe intraparenchymal hemorrhage with extension into  the occipital horn of the left lateral ventricle. Stable sulcal effacement in the left parieto-occipital lobe. 2. Stable 4 mm right midline shift. Basilar cisterns remain patent. 3. Underlying cerebral volume loss and mild chronic small vessel ischemia.   Ct Head Wo Contrast 11/23/2015 1. Mild enlargement of the left occipital lobe epicenter intra-axial hemorrhage, with estimated blood volume now of 46 mL. Stable surrounding edema and mild regional mass effect. 2. Small volume of intraventricular and subdural extension of blood is  stable since yesterday. No ventriculomegaly. 3. No new intracranial abnormality identified.   Ct Head Wo Contrast 11/22/2015   1. Acute intraparenchymal hemorrhage within the lower left parietal-occipital lobe, measuring 5.6 x 2.9 cm, with surrounding edema causing slight mass effect without midline shift or herniation. Hemorrhage extends into the adjacent posterior horn of the left lateral ventricle. Additional thin hemorrhage along the adjacent posterior falx and underlying tentorium.  2. Mild chronic small vessel ischemic change within the periventricular white matter.    Ct Head Wo Contrast 11/23/2015 1. Mild enlargement of the left occipital lobe epicenter intra-axial hemorrhage, with estimated blood volume now of 46 mL. Stable surrounding edema and mild regional mass effect. 2. Small volume of intraventricular and subdural extension of blood is stable since yesterday. No ventriculomegaly. 3. No new intracranial abnormality identified.  TTE  Study Conclusions - Left ventricle: The cavity size was normal. Wall thickness was  normal. Systolic function was vigorous. The estimated ejection  fraction was in the range of 65% to 70%. Wall motion was normal;  there were no regional wall motion abnormalities. Doppler  parameters are consistent with abnormal left ventricular  relaxation (grade 1 diastolic dysfunction). - Left atrium: The atrium was mildly dilated. Impressions: - Extremely limited due to poor sound wave transmission; vigorous  LV function; grade 1 diastolic dysfunction; mild LAE.   CUS  Bilateral: 1-39% ICA stenosis. Vertebral artery flow is antegrade.   PHYSICAL EXAM  Temp:  [98.2 F (36.8 C)-99.6 F (37.6 C)] 99.5 F (37.5 C) (03/21 1321) Pulse Rate:  [74-86] 79 (03/21 1321) Resp:  [18-20] 20 (03/21 1321) BP: (112-138)/(57-70) 121/70 mmHg (03/21 1321) SpO2:  [95 %-99 %] 96 % (03/21 1321)  General - Well nourished, well developed elderly Caucasian male  not in distress  Ophthalmologic - Fundi not visualized due to noncooperation.  Cardiovascular - Regular rate and rhythm..  Mental Status -  Patient is  Awake alertt following commands,    Cranial Nerves II - XII - II -  Right eye pupil surgical, left round and reactive.  III, IV, VI - Extraocular movements intact. Right eye poor vision acuity left eye nasal field cut V - Facial sensation intact bilaterally. VII - Facial movement intact bilaterally. VIII - Hearing impaired  X - Palate elevates symmetrically. XI -  shoulder shrug intact bilaterally. XII - Tongue protrusion intact.  Motor Strength - Difficul to fully assess due to confusion, but appears all limbs are antigravity and equal.   Bulk was normal and fasciculations were absent.   Motor Tone - Muscle tone was assessed at the neck and appendages and was normal.  Reflexes - The patient's reflexes were 1+ in all extremities and he had no pathological reflexes.  Sensory - Deifficult to assess due to confusion, moved all limbs to stim.  Coordination - The patient had normal movements in the hands with no apparent ataxia or dysmetria.  Tremor was absent.  Toes: downgoing bilat  Gait and Station - can stand unassisted. However gait  not tested due to safety concerns    ASSESSMENT/PLAN [name] is a 81 y.o. male with history of hyperlipidemia, hypertension, depression, anxiety, blind in the right eye, and COPD presenting with visual changes and confusion. He did not receive IV t-PA due to intracerebral hemorrhage.  Stroke:  Acute ICH hemorrhage within the lower left parietal-occipital lobe, etiology not clear. From location and age, concerning for CAA. However, hypertensive bleed still in DDx although son stated pt has no hx of HTN at home.  Resultant  HA and agitation.  Elevated temperature, elevated WBCs, AKI, hyponatremia. Will increase fluids. Internal medicine consult.   MRI  deferred - patient is  claustrophobic and not cooperative  MRA  Deferred - patient is claustrophobic and not cooperative  CTA deferred due to elevated Cre  CT repeat - 11/26/2015 - stable. Attempted MRI and MRA on 11/25/2015 and again the patient will not tolerate even with increased diazepam.  Carotid Doppler - Bilateral: 1-39% ICA stenosis. Vertebral artery flow is antegrade.   2D Echo  EF 65-70%. Extremely poor images but no cardiac source of emboli identified.  LDL 122  HgbA1c 5.9  VTE prophylaxis - SCDs Diet regular Room service appropriate?: Yes; Fluid consistency:: Thin  No antithrombotic prior to admission, now on No antithrombotic secondary to hemorrhage  Ongoing aggressive stroke risk factor management  Therapy recommendations:  CIR recommended   Disposition:  Pending  Hypertension  Stable   Relaxation encouraged   Put back home meds  BP goal < 160  Son denies any hx of HTN  Hyperlipidemia  Home meds:  No lipid lowering medications prior to admission.  LDL 122, goal < 70  Add statin 20mg  daily  Continue statin at discharge  Other Stroke Risk Factors  Advanced age  Cigarette smoker, advised to stop smoking  Other Active Problems  Anemia  Mildly elevated creatinine  Anxiety - back on valium. Per wife patient takes it possibly 3 times a week at most.  Tobacco withdrawal: We've reports he is not a heavy smoker and this is likely tobacco withdrawal.  Leukocytosis - afebrile - UA (not consistent with UTI) - urine culture neg, blood cultures no growth 3 days - CXR - neg.  Headaches - Fioricet    Appreciate internal medicine recommendations.  Delton Seeavid Rinehuls PA-C [facility] Pager [phone] 11/27/2015, 5:28 PM  Hospital day # 5   Plan mobilize out of bed. Physical therapy evaluate and rehabilitation versus disposition. Outpatient follow-up open MRI if patient is cooperative in a few months. Discussed with patient and family and answered  questions   Delia HeadyPramod Sethi, MD  [facility]    To contact Stroke Continuity provider, please refer to [email or web address]. After hours, contact General Neurology

## 2015-11-27 NOTE — Progress Notes (Signed)
Patient ID: David BarlowDavid R Duke, male   DOB: 10/16/1934, 81 y.o.   MRN: [id number] [facility] PROGRESS NOTE  David BarlowDavid R Duke WUJ:[id number] DOB: 09/03/1935 DOA: 11/22/2015  PCP: No primary care provider on file.  Brief narrative:    [age] y.o. 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 Patient's wife additionally reported that the patient seemed confused earlier in the day of the admission but has resolved on its own in about 45 minutes.   Assessment/Plan:    Acute encephalopathy - multifactorial, due to acute ICH hemorrhage as well as worsening acute kidney injury likely related to dehydration  - UA unremarkable - mental status clear this AM - would think pt is OK to go to inpatient rehab if primary team agrees, pt is in agreement with plan   Acute ICH hemorrhage within the lower left parietal-occipital lobe, etiology not clear - From location and age, concerning for CAA. However, hypertensive bleed still in DDx  - CT repeat - 11/26/2015 - stable, attempted MRI and MRA on 11/25/2015 and again and patient did not tolerate - Carotid Doppler - Bilateral: 1-39% ICA stenosis. Vertebral artery flow is antegrade.  - 2D Echo EF 65-70%. Extremely poor images but no cardiac source of emboli identified - LDL 122 - HgbA1c 5.9  Acute kidney injury - Likely related to dehydration as result of decreased oral intake - overall trending down, needs outpatient monitoring, repeat blood work in 2 weeks  - Avoid nephrotoxins  Fever and leukocytosis - unclear etiology, currently afebrile and WBC is WNL - denies any specific urinary concerns, no specific cardiopulmonary concerns   Hyponatremia - resolved  Hypokalemia - supplement and repeat BMP in AM  DVT prophylaxis - SCD's  Code Status: Full.  Family Communication:  plan of care discussed with the patient Disposition Plan: CIR  when OK by primary team, will signs off, please call us with questions   IV access:  Peripheral IV  Procedures and diagnostic studies:    Ct Head Wo Contrast 11/26/2015 No new or worsening fighting. Expected evolutionary changes with some contraction of the hyperdense clot in the left posterior temporal and occipital region. Small amount of ventricular blood is the same. No change in ventricular size. No change in mass effect.   Ct Head Wo Contrast 11/24/2015  Stable to slightly decreased left occipital lobe intraparenchymal hemorrhage with extension into the occipital horn of the left lateral ventricle. Stable sulcal effacement in the left parieto-occipital lobe. 2. Stable 4 mm right midline shift.  Basilar cisterns remain patent. 3. Underlying cerebral volume loss and mild chronic small vessel ischemia.  Ct Head Wo Contrast 11/23/2015   Mild enlargement of the left occipital lobe epicenter intra-axial hemorrhage, with estimated blood volume now of 46 mL. Stable surrounding edema and mild regional mass effect. 2. Small volume of intraventricular and subdural extension of blood is stable since yesterday. No ventriculomegaly. 3. No new intracranial abnormality  Ct Head Wo Contrast 11/22/2015   1. Acute intraparenchymal hemorrhage within the lower left parietal-occipital lobe, measuring 5.6 x 2.9 cm, with surrounding edema causing slight mass effect without midline shift or herniation. Hemorrhage extends into the adjacent posterior horn of the left lateral ventricle. Additional thin hemorrhage along the adjacent posterior falx and underlying tentorium. 2. Mild chronic small vessel ischemic change within the periventricular white matter.   Dg Chest Port 1 View 11/25/2015 No edema or consolidation.   Dg  Chest Port 1 View 11/24/2015 Shallow inflation. 2.  No evidence for acute cardiopulmonary abnormality.   Medical Consultants:  [facility]  Other Consultants:  PT  IAnti-Infectives:    None  Debbora Presto, MD  [facility] Pager [phone] Cell [phone]  If 7PM-7AM, please contact night-coverage [email or web address] Password [facility] 11/27/2015, 11:56 AM   LOS: 5 days   HPI/Subjective: No events overnight.   Objective: Filed Vitals:   11/26/15 2215 11/27/15 0227 11/27/15 0620 11/27/15 0943  BP:  138/69 126/65 112/57  Pulse:  79 76 86  Temp:  99.6 F (37.6 C) 98.4 F (36.9 C) 98.7 F (37.1 C)  TempSrc:  Oral Oral Oral  Resp:  Height:      Weight:      SpO2: 99% 95% 97% 95%    Intake/Output Summary (Last 24 hours) at 11/27/15 1156 Last data filed at 11/27/15 0943  Gross per 24 hour  Intake      0 ml  Output   1200 ml  Net  -1200 ml    Exam:   General:  Pt is alert, follows commands appropriately, not in acute distress  Cardiovascular: Regular rate and rhythm, no rubs, no gallops  Respiratory: Clear to auscultation bilaterally, no wheezing, no crackles, no rhonchi  Abdomen: Soft, non tender, non distended, bowel sounds present, no guarding  Extremities: No edema, pulses DP and PT palpable bilaterally  Data Reviewed: Basic Metabolic Panel:  Recent Labs Lab 11/23/15 0720 11/24/15 0523 11/25/15 0511 11/26/15 0536 11/27/15 0615  NA 139 133* 133* 137 139  K 4.1 4.2 3.5 3.0* 3.2*  CL 106 102 103 108 103  CO2 21* 22 17* 20* 24  GLUCOSE 103* 121* 84 102* 109*  BUN 10 17 23* 19 14  CREATININE 1.37* 1.36* 1.85* 1.39* 1.41*  CALCIUM 9.1 8.7* 8.7* 8.0* 8.3*   Liver Function Tests:  Recent Labs Lab 11/22/15 1720  AST 22  ALT 15*  ALKPHOS 90  BILITOT 0.5  PROT 7.1  ALBUMIN 3.9   CBC:  Recent Labs Lab 11/22/15 1720  11/23/15 0720 11/24/15 0523 11/25/15 0511 11/25/15 1543 11/26/15 0536 11/27/15 0615  WBC 12.8*  --  10.6* 14.0* 15.2*  --  10.5 9.5  NEUTROABS 11.3*  --   --   --   --   --  7.9*  --   HGB 15.5  < > 14.6 13.3 13.8  --  12.5* 12.3*  HCT 47.1  < > 44.1 40.1 40.8 41.1 37.7* 37.4*  MCV 90.8  --  90.0 89.3 87.0   --  88.5 89.0  PLT PLATELETS APPEAR ADEQUATE  --  197 197 211  --  164 177  < > = values in this interval not displayed. CBG:  Recent Labs Lab 11/26/15 1128 11/26/15 1520 11/26/15 2208 11/27/15 0647 11/27/15 1112  GLUCAP 110* 176* 112* 107* 124*    Recent Results (from the past 240 hour(s))  MRSA PCR Screening     Status: None   Collection Time: 11/22/15 11:19 PM  Result Value Ref Range Status   MRSA by PCR NEGATIVE NEGATIVE Final    Comment:        The GeneXpert MRSA Assay (FDA approved for NASAL specimens only), is one component of a comprehensive MRSA colonization surveillance program. It is not intended to diagnose MRSA infection nor to guide or monitor treatment for MRSA infections.   Culture, blood (routine x 2)     Status: None (Preliminary result)   Collection  Time: 11/24/15 12:00 PM  Result Value Ref Range Status   Specimen Description BLOOD RIGHT ANTECUBITAL  Final   Special Requests BOTTLES DRAWN AEROBIC AND ANAEROBIC 10CC  Final   Culture NO GROWTH 2 DAYS  Final   Report Status PENDING  Incomplete  Culture, blood (routine x 2)     Status: None (Preliminary result)   Collection Time: 11/24/15 12:15 PM  Result Value Ref Range Status   Specimen Description BLOOD RIGHT HAND  Final   Special Requests BOTTLES DRAWN AEROBIC AND ANAEROBIC 10CC  Final   Culture NO GROWTH 2 DAYS  Final   Report Status PENDING  Incomplete  Urine culture     Status: None   Collection Time: 11/25/15  3:09 PM  Result Value Ref Range Status   Specimen Description URINE, CATHETERIZED  Final   Special Requests none  Final   Culture NO GROWTH 1 DAY  Final   Report Status 11/26/2015 FINAL  Final     Scheduled Meds: .  stroke: mapping our early stages of recovery book   Does not apply Once  . atorvastatin  20 mg Oral q1800  . calcium-vitamin D  1 tablet Oral Q breakfast  . diazepam  5 mg Oral Q12H  . pantoprazole  40 mg Oral QHS  . potassium chloride  40 mEq Oral Once  .  senna-docusate  1 tablet Oral BID  . sertraline  100 mg Oral Daily  . tamsulosin  0.4 mg Oral Daily   Continuous Infusions:

## 2015-11-27 NOTE — [place] FL2 (Signed)
[place] MEDICAID FL2 LEVEL OF CARE SCREENING TOOL     IDENTIFICATION  Patient Name: David Duke Birthdate: 03/20/1935 Sex: male Admission Date (Current Location): 11/22/2015  [facility] [place]  [place]  [facility]([place]HC [place]- [id number]) Facility and Address:  [facility], [address], [place], [name] [address]      Provider Number: [id number]  Attending Physician Name and Address:  Micki RileyPramod S Sethi, MD  Relative Name and Phone Number:   [name]([name] ; spouse- [phone])    Current Level of Care: Hospital Recommended Level of Care: Skilled Nursing Facility Prior Approval Number:    Date Approved/Denied:   PASRR Number:    Discharge Plan: SNF    Current Diagnoses: Patient Active Problem List   Diagnosis Date Noted  . Aphasia   . Acute encephalopathy 11/25/2015  . Hyponatremia 11/25/2015  . Acute kidney injury (HCC) 11/25/2015  . Depression   . Anxiety   . COPD (chronic obstructive pulmonary disease) (HCC)   . ICH (intracerebral hemorrhage) (HCC) 11/22/2015    Orientation RESPIRATION BLADDER Height & Weight     Self, Situation, Place  Normal Indwelling catheter Weight: 141 lb 5 oz (64.1 kg) Height:  5\' 7"  (170.2 cm)  BEHAVIORAL SYMPTOMS/MOOD NEUROLOGICAL BOWEL NUTRITION STATUS      Continent Diet (REGULAR)  AMBULATORY STATUS COMMUNICATION OF NEEDS Skin   Limited Assist   Normal                       Personal Care Assistance Level of Assistance              Functional Limitations Info  Speech     Speech Info: Impaired    SPECIAL CARE FACTORS FREQUENCY  PT (By licensed PT), Speech therapy     PT Frequency:  (3x/week)              Contractures      Additional Factors Info  Code Status, Allergies Code Status Info:  (FULL) Allergies Info:  (NO KNOWN ALLERGIES)           Current Medications (11/27/2015):  This is the current hospital active medication list Current  Facility-Administered Medications  Medication Dose Route Frequency Provider Last Rate Last Dose  .  stroke: mapping our early stages of recovery book   Does not apply Once Ram Daniel NonesNarayan Kaveer Nandigam, MD      . acetaminophen (TYLENOL) tablet 650 mg  650 mg Oral Q6H PRN Kinnie Scalesavid L Rinehuls, PA-C   650 mg at 11/26/15 1402  . albuterol (PROVENTIL) (2.5 MG/3ML) 0.083% nebulizer solution 3 mL  3 mL Inhalation Q6H PRN Kinnie Scalesavid L Rinehuls, PA-C   3 mL at 11/26/15 2215  . atorvastatin (LIPITOR) tablet 20 mg  20 mg Oral q1800 Marvel PlanJindong Xu, MD   20 mg at 11/26/15 1834  . butalbital-acetaminophen-caffeine (FIORICET, ESGIC) 50-325-40 MG per tablet 1 tablet  1 tablet Oral Q8H PRN Kinnie Scalesavid L Rinehuls, PA-C   1 tablet at 11/27/15 0241  . calcium-vitamin D (OSCAL WITH D) 500-200 MG-UNIT per tablet 1 tablet  1 tablet Oral Q breakfast Micki RileyPramod S Sethi, MD   1 tablet at 11/27/15 0854  . diazepam (VALIUM) tablet 5 mg  5 mg Oral Q12H Calvert CantorSaima Rizwan, MD   5 mg at 11/27/15 0854  . ondansetron (ZOFRAN) injection 4 mg  4 mg Intravenous Q8H PRN Tionne L Rinehuls, PA-C   4 mg at 11/23/15 1035  . pantoprazole (PROTONIX) EC  tablet 40 mg  40 mg Oral QHS Faye Ramsay Rumbarger, RPH   40 mg at 11/26/15 2107  . senna-docusate (Senokot-S) tablet 1 tablet  1 tablet Oral BID Ram Daniel Nones, MD   1 tablet at 11/27/15 [phone]  . sertraline (ZOLOFT) tablet 100 mg  100 mg Oral Daily Clemmie L Rinehuls, PA-C   100 mg at 11/27/15 0854  . tamsulosin (FLOMAX) capsule 0.4 mg  0.4 mg Oral Daily Tonio L Rinehuls, PA-C   0.4 mg at 11/27/15 9604     Discharge Medications: Please see discharge summary for a list of discharge medications.  Relevant Imaging Results:  Relevant Lab Results:   Additional Information  (SSN: [id number])  Orson Gear, Student-SW [phone]   I have personally examined this patient, reviewed notes,  participated in medical decision making and plan of care. I have made any additions or clarifications directly to the  above note. Agree with note above.   Delia Heady, MD Medical Director [facility] Pager: [phone] 12/03/2015 8:34 AM

## 2015-11-27 NOTE — Evaluation (Signed)
Occupational Therapy Evaluation Patient Details Name: David Duke MRN: [id number] DOB: 30-Dec-1934 Today's Date: 11/27/2015    History of Present Illness Pt is a 81 y/o M w/ confusion and Lt parietal-occipital intraparenchymal hemorrhage. Pt's PMH includes depression, anxiety, macular degeneration Bil eyes, blind Rt eye, COPD.   Clinical Impression   Pt admitted with above. He demonstrates the below listed deficits and will benefit from continued OT to maximize safety and independence with BADLs.  Pt presents to OT with generalized weakness, impaired balance, and significant cognitive deficits.  Currently, he requires min - mod A for ADLs.  Feel he would benefit from CIR.       Follow Up Recommendations  CIR    Equipment Recommendations  3 in 1 bedside comode    Recommendations for Other Services Rehab consult     Precautions / Restrictions Precautions Precautions: Fall      Mobility Bed Mobility Overal bed mobility: Needs Assistance;+2 for physical assistance Bed Mobility: Supine to Sit;Sit to Supine     Supine to sit: Min guard Sit to supine: Min guard      Transfers Overall transfer level: Needs assistance   Transfers: Sit to/from [employer] Transfers Sit to Stand: Min assist Stand pivot transfers: Min assist       General transfer comment: assist for balance    Balance   Sitting-balance support: No upper extremity supported Sitting balance-Leahy Scale: Good     Standing balance support: During functional activity Standing balance-Leahy Scale: Poor                              ADL Overall ADL's : Needs assistance/impaired Eating/Feeding: Modified independent;Sitting   Grooming: Wash/dry hands;Wash/dry face;Oral care;Brushing hair;Minimal assistance;Moderate assistance;Standing   Upper Body Bathing: Minimal assitance;Sitting   Lower Body Bathing: Moderate assistance;Sit to/from stand   Upper Body Dressing : Minimal  assistance;Sitting   Lower Body Dressing: Moderate assistance;Sit to/from stand   Toilet Transfer: Minimal assistance;Stand-pivot;BSC   Toileting- Clothing Manipulation and Hygiene: Minimal assistance;Sit to/from stand       Functional mobility during ADLs: Minimal assistance General ADL Comments: Pt requires mod A due to being highly distractable and difficulty staying on task      Vision Additional Comments: Pt unable to participate in formal assessment due to level of distractability    Perception Perception Perception Tested?: Yes   Praxis Praxis Praxis tested?: Within functional limits    Pertinent Vitals/Pain Pain Assessment: Faces Faces Pain Scale: Hurts little more Pain Location: headache  Pain Descriptors / Indicators: Grimacing;Headache Pain Intervention(s): Monitored during session     Hand Dominance Right   Extremity/Trunk Assessment Upper Extremity Assessment Upper Extremity Assessment: Overall WFL for tasks assessed   Lower Extremity Assessment Lower Extremity Assessment: Defer to PT evaluation   Cervical / Trunk Assessment Cervical / Trunk Assessment: Kyphotic   Communication Communication Communication: No difficulties   Cognition Arousal/Alertness: Awake/alert Behavior During Therapy: WFL for tasks assessed/performed Overall Cognitive Status: Impaired/Different from baseline Area of Impairment: Orientation;Attention;Following commands;Safety/judgement;Problem solving;Awareness Orientation Level: Situation;Disoriented to;Time Current Attention Level: Sustained Memory: Decreased short-term memory Following Commands: Follows one step commands consistently Safety/Judgement: Decreased awareness of safety;Decreased awareness of deficits Awareness: Intellectual Problem Solving: Slow processing;Difficulty sequencing;Requires verbal cues;Requires tactile cues General Comments: Pt is histrionic and tangential.  He self distracts frequently - requires  max cues to stay on task    General Comments       Exercises  Shoulder Instructions      Home Living Family/patient expects to be discharged to:: Private residence Living Arrangements: Spouse/significant other Available Help at Discharge: Family Type of Home: House       Home Layout: One level                   Additional Comments: Pt is very histrionic, and easily self distracts.  Was unable to tell me details of home environment       Prior Functioning/Environment          Comments: Pt unable to provide details    OT Diagnosis: Generalized weakness;Cognitive deficits;Disturbance of vision   OT Problem List: Decreased strength;Decreased activity tolerance;Impaired balance (sitting and/or standing);Impaired vision/perception;Decreased cognition;Decreased safety awareness;Decreased knowledge of use of DME or AE;Decreased knowledge of precautions   OT Treatment/Interventions: Self-care/ADL training;DME and/or AE instruction;Therapeutic activities;Cognitive remediation/compensation;Visual/perceptual remediation/compensation;Patient/family education;Balance training    OT Goals(Current goals can be found in the care plan section) Acute Rehab OT Goals Patient Stated Goal: none stated OT Goal Formulation: With patient Time For Goal Achievement: 12/11/15 Potential to Achieve Goals: Good ADL Goals Pt Will Perform Grooming: with supervision;standing Pt Will Perform Upper Body Bathing: with set-up;sitting Pt Will Perform Lower Body Bathing: with supervision;sit to/from stand Pt Will Perform Upper Body Dressing: with set-up;sitting Pt Will Perform Lower Body Dressing: with supervision;sit to/from stand Pt Will Transfer to Toilet: with supervision;ambulating;regular height toilet;bedside commode;grab bars Pt Will Perform Toileting - Clothing Manipulation and hygiene: with supervision;sit to/from stand Additional ADL Goal #1: Pt will sustain demonstrate selective  attention during ADL tasks with min cues Additional ADL Goal #2: Pt will be oriented x 4 with use of external cues as needed   OT Frequency: Min 2X/week   Barriers to D/C:            Co-evaluation              End of Session Nurse Communication: Mobility status  Activity Tolerance: Patient tolerated treatment well Patient left: in bed;with call bell/phone within reach;with bed alarm set   Time: [id number] OT Time Calculation (min): 16 min Charges:  OT General Charges $OT Visit: 1 Procedure OT Evaluation $OT Eval Moderate Complexity: 1 Procedure G-Codes:    Abdulloh Ullom M 11/27/2015, 5:18 PM

## 2015-11-27 NOTE — Progress Notes (Signed)
Pt stated he had a sharp, sudden onset headache in the front part of his head, both sides of his head, and the back of his head.  BP 138/69, HR 79, Temp 99.6.  Neuro assessment the same as earlier.  MD notified.  MD told RN to treat the headache and check on the pt frequently to see if any neuro changes.  Will continue to closely monitor.   Estanislado EmmsAshley Schwarz, RN

## 2015-11-27 NOTE — Clinical Social Work Placement (Signed)
   CLINICAL SOCIAL WORK PLACEMENT  NOTE  Date:  11/27/2015  Patient Details  Name: David Duke MRN: [id number] Date of Birth: 11/14/1934  Clinical Social Work is seeking post-discharge placement for this patient at the Skilled  Nursing Facility level of care (*CSW will initial, date and re-position this form in  chart as items are completed):  Yes   Patient/family provided with [facility] Clinical Social Work Department's list of facilities offering this level of care within the geographic area requested by the patient (or if unable, by the patient's family).  Yes   Patient/family informed of their freedom to choose among providers that offer the needed level of care, that participate in Medicare, Medicaid or managed care program needed by the patient, have an available bed and are willing to accept the patient.  Yes   Patient/family informed of [facility]'s ownership interest in [facility] and [facility], as well as of the fact that they are under no obligation to receive care at these facilities.  PASRR submitted to EDS on       PASRR number received on       Existing PASRR number confirmed on       FL2 transmitted to all facilities in geographic area requested by pt/family on 11/27/15     FL2 transmitted to all facilities within larger geographic area on       Patient informed that his/her managed care company has contracts with or will negotiate with certain facilities, including the following:            Patient/family informed of bed offers received.  Patient chooses bed at       Physician recommends and patient chooses bed at      Patient to be transferred to   on  .  Patient to be transferred to facility by       Patient family notified on   of transfer.  Name of family member notified:        PHYSICIAN Please sign FL2     Additional Comment:    _______________________________________________ David Duke, Student-SW 11/27/2015, 9:37 AM

## 2015-11-27 NOTE — Progress Notes (Signed)
I met with pt, his wife, and son at bedside to discuss a possible inpt rehab admission pending insurance approval and bed availability when pt medically ready for d/c. They prefer inpt rehab but aware that SNF rehab will also be sought by SW. I have updated SW and will follow up in the morning. [phone]

## 2015-11-28 ENCOUNTER — Inpatient Hospital Stay (HOSPITAL_COMMUNITY)
Admission: RE | Admit: 2015-11-28 | Discharge: 2015-12-11 | DRG: 065 | Disposition: A | Discharge status: Home Health | Payer: Medicare Other | Source: Intra-hospital | Attending: Physical Medicine & Rehabilitation | Admitting: Physical Medicine & Rehabilitation

## 2015-11-28 ENCOUNTER — Inpatient Hospital Stay (HOSPITAL_COMMUNITY): Payer: Medicare Other

## 2015-11-28 DIAGNOSIS — H3530 Unspecified macular degeneration: Secondary | ICD-10-CM | POA: Diagnosis present

## 2015-11-28 DIAGNOSIS — N401 Enlarged prostate with lower urinary tract symptoms: Secondary | ICD-10-CM | POA: Diagnosis present

## 2015-11-28 DIAGNOSIS — N189 Chronic kidney disease, unspecified: Secondary | ICD-10-CM

## 2015-11-28 DIAGNOSIS — F4323 Adjustment disorder with mixed anxiety and depressed mood: Secondary | ICD-10-CM | POA: Diagnosis present

## 2015-11-28 DIAGNOSIS — I69319 Unspecified symptoms and signs involving cognitive functions following cerebral infarction: Secondary | ICD-10-CM | POA: Diagnosis not present

## 2015-11-28 DIAGNOSIS — J449 Chronic obstructive pulmonary disease, unspecified: Secondary | ICD-10-CM | POA: Diagnosis present

## 2015-11-28 DIAGNOSIS — N179 Acute kidney failure, unspecified: Secondary | ICD-10-CM | POA: Diagnosis present

## 2015-11-28 DIAGNOSIS — J329 Chronic sinusitis, unspecified: Secondary | ICD-10-CM | POA: Diagnosis not present

## 2015-11-28 DIAGNOSIS — H5441 Blindness, right eye, normal vision left eye: Secondary | ICD-10-CM | POA: Diagnosis present

## 2015-11-28 DIAGNOSIS — R338 Other retention of urine: Secondary | ICD-10-CM | POA: Diagnosis present

## 2015-11-28 DIAGNOSIS — F0391 Unspecified dementia with behavioral disturbance: Secondary | ICD-10-CM | POA: Diagnosis present

## 2015-11-28 DIAGNOSIS — E876 Hypokalemia: Secondary | ICD-10-CM | POA: Diagnosis present

## 2015-11-28 DIAGNOSIS — F17210 Nicotine dependence, cigarettes, uncomplicated: Secondary | ICD-10-CM | POA: Diagnosis present

## 2015-11-28 DIAGNOSIS — R339 Retention of urine, unspecified: Secondary | ICD-10-CM | POA: Diagnosis present

## 2015-11-28 DIAGNOSIS — N400 Enlarged prostate without lower urinary tract symptoms: Secondary | ICD-10-CM | POA: Diagnosis present

## 2015-11-28 DIAGNOSIS — I69393 Ataxia following cerebral infarction: Secondary | ICD-10-CM

## 2015-11-28 DIAGNOSIS — G44229 Chronic tension-type headache, not intractable: Secondary | ICD-10-CM | POA: Diagnosis present

## 2015-11-28 DIAGNOSIS — R7303 Prediabetes: Secondary | ICD-10-CM | POA: Diagnosis present

## 2015-11-28 DIAGNOSIS — R410 Disorientation, unspecified: Secondary | ICD-10-CM | POA: Diagnosis present

## 2015-11-28 DIAGNOSIS — I612 Nontraumatic intracerebral hemorrhage in hemisphere, unspecified: Secondary | ICD-10-CM

## 2015-11-28 DIAGNOSIS — I610 Nontraumatic intracerebral hemorrhage in hemisphere, subcortical: Secondary | ICD-10-CM | POA: Diagnosis not present

## 2015-11-28 DIAGNOSIS — R51 Headache: Secondary | ICD-10-CM

## 2015-11-28 DIAGNOSIS — I69993 Ataxia following unspecified cerebrovascular disease: Secondary | ICD-10-CM | POA: Diagnosis not present

## 2015-11-28 DIAGNOSIS — R269 Unspecified abnormalities of gait and mobility: Secondary | ICD-10-CM

## 2015-11-28 DIAGNOSIS — F40240 Claustrophobia: Secondary | ICD-10-CM | POA: Diagnosis present

## 2015-11-28 DIAGNOSIS — I619 Nontraumatic intracerebral hemorrhage, unspecified: Secondary | ICD-10-CM | POA: Diagnosis present

## 2015-11-28 DIAGNOSIS — J0110 Acute frontal sinusitis, unspecified: Secondary | ICD-10-CM | POA: Diagnosis not present

## 2015-11-28 DIAGNOSIS — I69398 Other sequelae of cerebral infarction: Secondary | ICD-10-CM | POA: Diagnosis present

## 2015-11-28 DIAGNOSIS — R519 Headache, unspecified: Secondary | ICD-10-CM | POA: Diagnosis present

## 2015-11-28 DIAGNOSIS — F4310 Post-traumatic stress disorder, unspecified: Secondary | ICD-10-CM | POA: Diagnosis present

## 2015-11-28 DIAGNOSIS — I69919 Unspecified symptoms and signs involving cognitive functions following unspecified cerebrovascular disease: Secondary | ICD-10-CM | POA: Diagnosis not present

## 2015-11-28 DIAGNOSIS — R0602 Shortness of breath: Secondary | ICD-10-CM

## 2015-11-28 LAB — URINALYSIS, ROUTINE W REFLEX MICROSCOPIC
Bilirubin Urine: NEGATIVE
GLUCOSE, UA: NEGATIVE mg/dL
KETONES UR: NEGATIVE mg/dL
Nitrite: NEGATIVE
PROTEIN: NEGATIVE mg/dL
Specific Gravity, Urine: 1.021 (ref 1.005–1.030)
pH: 6 (ref 5.0–8.0)

## 2015-11-28 LAB — GLUCOSE, CAPILLARY
GLUCOSE-CAPILLARY: 106 mg/dL — AB (ref 65–99)
GLUCOSE-CAPILLARY: 99 mg/dL (ref 65–99)
GLUCOSE-CAPILLARY: 89 mg/dL (ref 65–99)

## 2015-11-28 LAB — URINE MICROSCOPIC-ADD ON

## 2015-11-28 MED ORDER — TAMSULOSIN HCL 0.4 MG PO CAPS: 0.4000 mg | ORAL_CAPSULE | Freq: Every day | ORAL | Status: DC

## 2015-11-28 MED ORDER — ALBUTEROL SULFATE (2.5 MG/3ML) 0.083% IN NEBU: 2.5000 mg | INHALATION_SOLUTION | Freq: Four times a day (QID) | RESPIRATORY_TRACT | Status: DC

## 2015-11-28 MED ORDER — DIAZEPAM 5 MG PO TABS: 5.0000 mg | ORAL_TABLET | Freq: Three times a day (TID) | ORAL | Status: DC | PRN

## 2015-11-28 MED ORDER — ATORVASTATIN CALCIUM 20 MG PO TABS
20.0000 mg | ORAL_TABLET | Freq: Every day | ORAL | Status: DC
  Administered 2015-11-28 – 2015-12-10 (×13): 20 mg via ORAL
  Filled 2015-11-28 (×13): qty 1

## 2015-11-28 MED ORDER — ALUM & MAG HYDROXIDE-SIMETH 200-200-20 MG/5ML PO SUSP: 30.0000 mL | Freq: Four times a day (QID) | ORAL | Status: DC | PRN

## 2015-11-28 MED ORDER — FLUTICASONE PROPIONATE 50 MCG/ACT NA SUSP
1.0000 | Freq: Every day | NASAL | Status: DC
  Administered 2015-11-29 – 2015-12-11 (×11): 1 via NASAL
  Filled 2015-11-28 (×2): qty 16

## 2015-11-28 MED ORDER — FLEET ENEMA 7-19 GM/118ML RE ENEM: 1.0000 | ENEMA | Freq: Once | RECTAL | Status: DC | PRN

## 2015-11-28 MED ORDER — PROCHLORPERAZINE EDISYLATE 5 MG/ML IJ SOLN: 5.0000 mg | Freq: Four times a day (QID) | INTRAMUSCULAR | Status: DC | PRN

## 2015-11-28 MED ORDER — PROCHLORPERAZINE MALEATE 5 MG PO TABS: 5.0000 mg | ORAL_TABLET | Freq: Four times a day (QID) | ORAL | Status: DC | PRN

## 2015-11-28 MED ORDER — SENNA 8.6 MG PO TABS
1.0000 | ORAL_TABLET | Freq: Two times a day (BID) | ORAL | Status: DC
  Administered 2015-11-29 – 2015-12-04 (×11): 8.6 mg via ORAL
  Filled 2015-11-28 (×11): qty 1

## 2015-11-28 MED ORDER — PANTOPRAZOLE SODIUM 40 MG PO TBEC
40.0000 mg | DELAYED_RELEASE_TABLET | Freq: Every day | ORAL | Status: DC
Start: 2015-11-29 — End: 2015-12-04
  Administered 2015-11-29 – 2015-12-04 (×6): 40 mg via ORAL
  Filled 2015-11-28 (×6): qty 1

## 2015-11-28 MED ORDER — POTASSIUM CHLORIDE CRYS ER 20 MEQ PO TBCR
20.0000 meq | EXTENDED_RELEASE_TABLET | Freq: Two times a day (BID) | ORAL | Status: AC
  Administered 2015-11-28 – 2015-11-30 (×4): 20 meq via ORAL
  Filled 2015-11-28 (×4): qty 1

## 2015-11-28 MED ORDER — SILODOSIN 8 MG PO CAPS
8.0000 mg | ORAL_CAPSULE | Freq: Every day | ORAL | Status: DC
  Administered 2015-11-28: 8 mg via ORAL
  Filled 2015-11-28: qty 1

## 2015-11-28 MED ORDER — DIPHENHYDRAMINE HCL 12.5 MG/5ML PO ELIX: 12.5000 mg | ORAL_SOLUTION | Freq: Four times a day (QID) | ORAL | Status: DC | PRN

## 2015-11-28 MED ORDER — FLUTICASONE PROPIONATE 50 MCG/ACT NA SUSP: 1.0000 | Freq: Every day | NASAL | Status: DC

## 2015-11-28 MED ORDER — TRAZODONE HCL 50 MG PO TABS: 25.0000 mg | ORAL_TABLET | Freq: Every evening | ORAL | Status: DC | PRN

## 2015-11-28 MED ORDER — CALCIUM CARBONATE-VITAMIN D 500-200 MG-UNIT PO TABS: 1.0000 | ORAL_TABLET | Freq: Every day | ORAL | Status: DC

## 2015-11-28 MED ORDER — NON FORMULARY: 8.0000 mg | Freq: Every day | Status: DC

## 2015-11-28 MED ORDER — TOPIRAMATE 25 MG PO TABS
25.0000 mg | ORAL_TABLET | Freq: Every day | ORAL | Status: DC
  Administered 2015-11-28: 25 mg via ORAL
  Filled 2015-11-28: qty 1

## 2015-11-28 MED ORDER — TRAZODONE HCL 50 MG PO TABS
25.0000 mg | ORAL_TABLET | Freq: Every evening | ORAL | Status: DC | PRN
  Administered 2015-12-01 – 2015-12-02 (×3): 50 mg via ORAL
  Filled 2015-11-28 (×3): qty 1

## 2015-11-28 MED ORDER — ALBUTEROL SULFATE (2.5 MG/3ML) 0.083% IN NEBU: 3.0000 mL | INHALATION_SOLUTION | Freq: Four times a day (QID) | RESPIRATORY_TRACT | Status: DC | PRN

## 2015-11-28 MED ORDER — BISACODYL 10 MG RE SUPP: 10.0000 mg | Freq: Every day | RECTAL | Status: DC | PRN

## 2015-11-28 MED ORDER — CALCIUM CITRATE-VITAMIN D 500-400 MG-UNIT PO CHEW
1.0000 | CHEWABLE_TABLET | Freq: Two times a day (BID) | ORAL | Status: DC
  Filled 2015-11-28: qty 1

## 2015-11-28 MED ORDER — POTASSIUM CHLORIDE CRYS ER 20 MEQ PO TBCR: 30.0000 meq | EXTENDED_RELEASE_TABLET | Freq: Once | ORAL | Status: DC

## 2015-11-28 MED ORDER — ALBUTEROL SULFATE (2.5 MG/3ML) 0.083% IN NEBU
2.5000 mg | INHALATION_SOLUTION | Freq: Four times a day (QID) | RESPIRATORY_TRACT | Status: DC
  Administered 2015-11-28 – 2015-11-29 (×2): 2.5 mg via RESPIRATORY_TRACT
  Filled 2015-11-28 (×2): qty 3

## 2015-11-28 MED ORDER — ACETAMINOPHEN 325 MG PO TABS
325.0000 mg | ORAL_TABLET | ORAL | Status: DC | PRN
Start: 2015-11-28 — End: 2015-12-11
  Administered 2015-11-29 (×2): 650 mg via ORAL
  Filled 2015-11-28 (×2): qty 2

## 2015-11-28 MED ORDER — ALUM & MAG HYDROXIDE-SIMETH 200-200-20 MG/5ML PO SUSP
30.0000 mL | ORAL | Status: DC | PRN
Start: 2015-11-28 — End: 2015-11-28

## 2015-11-28 MED ORDER — ALUM & MAG HYDROXIDE-SIMETH 200-200-20 MG/5ML PO SUSP: 30.0000 mL | ORAL | Status: DC | PRN

## 2015-11-28 MED ORDER — DIAZEPAM 5 MG PO TABS: 5.0000 mg | ORAL_TABLET | Freq: Two times a day (BID) | ORAL | Status: DC

## 2015-11-28 MED ORDER — ACETAMINOPHEN 325 MG PO TABS: 325.0000 mg | ORAL_TABLET | ORAL | Status: DC | PRN

## 2015-11-28 MED ORDER — PROCHLORPERAZINE 25 MG RE SUPP
12.5000 mg | Freq: Four times a day (QID) | RECTAL | Status: DC | PRN
Start: 2015-11-28 — End: 2015-12-11

## 2015-11-28 MED ORDER — ALUM & MAG HYDROXIDE-SIMETH 200-200-20 MG/5ML PO SUSP
30.0000 mL | Freq: Four times a day (QID) | ORAL | Status: DC | PRN
  Administered 2015-11-28: 30 mL via ORAL
  Filled 2015-11-28: qty 30

## 2015-11-28 MED ORDER — LORATADINE 10 MG PO TABS
10.0000 mg | ORAL_TABLET | Freq: Every day | ORAL | Status: DC
Start: 2015-11-29 — End: 2015-12-07
  Administered 2015-11-29 – 2015-12-07 (×9): 10 mg via ORAL
  Filled 2015-11-28 (×9): qty 1

## 2015-11-28 MED ORDER — DIAZEPAM 5 MG PO TABS
5.0000 mg | ORAL_TABLET | Freq: Two times a day (BID) | ORAL | Status: DC
  Administered 2015-11-28 – 2015-12-07 (×18): 5 mg via ORAL
  Filled 2015-11-28 (×18): qty 1

## 2015-11-28 MED ORDER — LORATADINE 10 MG PO TABS: 10.0000 mg | ORAL_TABLET | Freq: Every day | ORAL | Status: DC

## 2015-11-28 MED ORDER — SERTRALINE HCL 100 MG PO TABS: 100.0000 mg | ORAL_TABLET | Freq: Every day | ORAL | Status: DC

## 2015-11-28 MED ORDER — PROCHLORPERAZINE 25 MG RE SUPP: 12.5000 mg | Freq: Four times a day (QID) | RECTAL | Status: DC | PRN

## 2015-11-28 MED ORDER — ATORVASTATIN CALCIUM 10 MG PO TABS: 20.0000 mg | ORAL_TABLET | Freq: Every day | ORAL | Status: DC

## 2015-11-28 MED ORDER — GUAIFENESIN-DM 100-10 MG/5ML PO SYRP: 5.0000 mL | ORAL_SOLUTION | Freq: Four times a day (QID) | ORAL | Status: DC | PRN

## 2015-11-28 MED ORDER — SERTRALINE HCL 100 MG PO TABS
100.0000 mg | ORAL_TABLET | Freq: Every day | ORAL | Status: DC
  Administered 2015-11-29 – 2015-12-11 (×13): 100 mg via ORAL
  Filled 2015-11-28 (×13): qty 1

## 2015-11-28 MED ORDER — CALCIUM CARBONATE-VITAMIN D 500-200 MG-UNIT PO TABS
1.0000 | ORAL_TABLET | Freq: Two times a day (BID) | ORAL | Status: DC
  Administered 2015-11-28 – 2015-12-11 (×26): 1 via ORAL
  Filled 2015-11-28 (×27): qty 1

## 2015-11-28 MED ORDER — PANTOPRAZOLE SODIUM 40 MG PO TBEC: 40.0000 mg | DELAYED_RELEASE_TABLET | Freq: Every day | ORAL | Status: DC

## 2015-11-28 MED ORDER — BUTALBITAL-APAP-CAFFEINE 50-325-40 MG PO TABS: 1.0000 | ORAL_TABLET | Freq: Three times a day (TID) | ORAL | Status: DC | PRN

## 2015-11-28 MED ORDER — SENNOSIDES-DOCUSATE SODIUM 8.6-50 MG PO TABS: 1.0000 | ORAL_TABLET | Freq: Two times a day (BID) | ORAL | Status: DC

## 2015-11-28 NOTE — Progress Notes (Signed)
Physical Medicine and Rehabilitation Consult Reason for Consult: left ICH Referring Physician: Dr.Xu   HPI: David Duke is a 81 y.o.right handed male with history of macular degeneration and subsequent blindness right eye, COPD/tobacco abuse. Patient lives with his wife reported to be independent prior to admission. One level home with multiple steps to entry. Presented 11/22/2015 with blurred vision left eye as well as altered mental status reported by his wife. Blood pressure 145/98. Cranial CT scan showed acute intraparenchymal hemorrhage within the left lower parietal occipital lobe measuring 5.6 x 2.9 cm with surrounding edema causing slight mass effect without midline shift. Echocardiogram with ejection fraction of 70% grade 1 diastolic dysfunction. EEG nonspecific finding indicating of mild diffuse cerebral dysfunction no seizure activity. Carotid Dopplers with no ICA stenosis. Patient did not receive TPA due to intracranial hemorrhage. Neurology consulted MRI of the brain is pending. Close monitoring of blood pressure. Maintain on a regular diet. Physical therapy evaluation completed 11/26/2015 with recommendations of physical medicine rehabilitation consult.  Review of Systems  Constitutional: Negative for fever and chills.  HENT: Negative for hearing loss.  Eyes: Positive for blurred vision.  Respiratory: Negative for cough.   Shortness of breath with exertion  Cardiovascular: Negative for chest pain, palpitations and leg swelling.  Gastrointestinal: Positive for constipation. Negative for nausea and vomiting.  Genitourinary: Positive for urgency. Negative for dysuria and hematuria.  Musculoskeletal: Positive for myalgias and joint pain.  Skin: Negative for rash.  Neurological: Negative for seizures and headaches.  Psychiatric/Behavioral: Positive for depression.   Anxiety  All other systems reviewed and are negative.  Past Medical History  Diagnosis Date   . High cholesterol   . Depression   . Anxiety   . Macular degeneration of both eyes   . Blind right eye   . COPD (chronic obstructive pulmonary disease) (HCC)   . Shortness of breath dyspnea    Past Surgical History  Procedure Laterality Date  . No past surgeries     Family History  Problem Relation Age of Onset  . Colon cancer Mother   . Cerebral aneurysm Father    Social History:  reports that he has been smoking Cigarettes. He has a 30 pack-year smoking history. He does not have any smokeless tobacco history on file. He reports that he does not drink alcohol or use illicit drugs. Allergies: No Known Allergies Medications Prior to Admission  Medication Sig Dispense Refill  . albuterol (PROVENTIL HFA;VENTOLIN HFA) 108 (90 Base) MCG/ACT inhaler Inhale 2 puffs into the lungs every 6 (six) hours as needed for wheezing or shortness of breath.    . calcium-vitamin D (OSCAL WITH D) 500-200 MG-UNIT tablet Take 1 tablet by mouth daily with breakfast.    . diazepam (VALIUM) 5 MG tablet Take 5 mg by mouth 3 (three) times daily as needed for anxiety.    . Omega-3 Fatty Acids (FISH OIL) 1000 MG CAPS Take 1,000 mg by mouth daily.    . promethazine-codeine (PHENERGAN WITH CODEINE) 6.25-10 MG/5ML syrup Take 5 mLs by mouth every 6 (six) hours as needed for cough.    . sertraline (ZOLOFT) 100 MG tablet Take 100 mg by mouth daily.    . silodosin (RAPAFLO) 8 MG CAPS capsule Take 8 mg by mouth daily with breakfast.      Home: Home Living Family/patient expects to be discharged to:: Private residence Living Arrangements: Spouse/significant other Available Help at Discharge: Family Type of Home: House Home Layout: One level Additional Comments:  Pt is a poor historian and is unable to confirm information regarding PLOF and home living.   Functional History: Prior Function Comments: Pt is a poor historian and is  unable to confirm information regarding PLOF and home living.  Functional Status:  Mobility: Bed Mobility Overal bed mobility: Needs Assistance, +2 for physical assistance Bed Mobility: Supine to Sit, Sit to Supine Supine to sit: Min assist Sit to supine: Min assist, +2 for physical assistance General bed mobility comments: Simple commands to have pt sit EOB. Requires assist to elevate trunk and, when returning to supine, assist managing LEs Transfers Overall transfer level: Needs assistance Equipment used: 1 person hand held assist Transfers: Sit to/from Stand Sit to Stand: Min assist, +2 physical assistance, +2 safety/equipment General transfer comment: Assist to steady w/ 1 person HHA to stand. Pt stand ~15 seconds and then begins to sit. Once sitting pt asked if he felt dizzy and he replied, "no, light". BP 83/52. Ambulation/Gait General Gait Details: no able to assess    ADL:    Cognition: Cognition Overall Cognitive Status: Impaired/Different from baseline Arousal/Alertness: Awake/alert Orientation Level: Oriented to person, Oriented to place, Oriented to situation, Disoriented to time Attention: Focused, Sustained, Selective Focused Attention: Appears intact Sustained Attention: Appears intact Selective Attention: Impaired Selective Attention Impairment: Verbal basic Memory: Impaired Memory Impairment: Decreased recall of new information Awareness: Appears intact Executive Function: Sequencing, Organizing Sequencing: Impaired Sequencing Impairment: Verbal basic Organizing: Impaired Organizing Impairment: Verbal basic Behaviors: Other (comment) (tangential) Cognition Arousal/Alertness: Awake/alert Behavior During Therapy: WFL for tasks assessed/performed Overall Cognitive Status: Impaired/Different from baseline Area of Impairment: Orientation, Attention, Memory, Following commands, Safety/judgement, Awareness, Problem solving Orientation Level: Disoriented  to, Situation, Time Current Attention Level: Sustained Memory: Decreased short-term memory Following Commands: Follows one step commands inconsistently, Follows one step commands with increased time Safety/Judgement: Decreased awareness of deficits, Decreased awareness of safety Awareness: Intellectual Problem Solving: Slow processing, Difficulty sequencing, Requires verbal cues General Comments: Reported it as March 13th and could not recall why he was in the hospital. Had difficulty staying on task.  Blood pressure 138/69, pulse 79, temperature 99.6 F (37.6 C), temperature source Oral, resp. rate 18, height 5\' 7"  (1.702 m), weight 64.1 kg (141 lb 5 oz), SpO2 95 %. Physical Exam  Vitals reviewed. Constitutional: He appears well-developed and well-nourished.  HENT:  Head: Normocephalic and atraumatic.  Eyes: Conjunctivae and EOM are normal.  Right eye sluggish to light  Neck: Normal range of motion. Neck supple. No thyromegaly present.  Cardiovascular: Normal rate and regular rhythm.  Respiratory: Effort normal and breath sounds normal. No respiratory distress.  GI: Soft. Bowel sounds are normal. He exhibits no distension.  Musculoskeletal: He exhibits no edema or tenderness.  Neurological: He is alert.  Makes good eye contact with examiner.  Patient does appear to be mildly anxious.  Follows simple commands A&Ox1 Sensation intact to light touch 3+ DTRs RUE/RLE Motor: B/l UE: 4+/5 proximal to distal B/l LE 5/5 proximal to distal  Skin: Skin is warm and dry.  Psychiatric: His speech is normal. He is slowed. Cognition and memory are impaired.     Lab Results Last 24 Hours    Results for orders placed or performed during the hospital encounter of 11/22/15 (from the past 24 hour(s))  Glucose, capillary Status: None   Collection Time: 11/26/15 8:07 AM  Result Value Ref Range   Glucose-Capillary 86 65 - 99 mg/dL   Comment 1 Notify RN    Comment 2  Document  in Chart   Glucose, capillary Status: Abnormal   Collection Time: 11/26/15 11:28 AM  Result Value Ref Range   Glucose-Capillary 110 (H) 65 - 99 mg/dL   Comment 1 Notify RN    Comment 2 Document in Chart   Glucose, capillary Status: Abnormal   Collection Time: 11/26/15 3:20 PM  Result Value Ref Range   Glucose-Capillary 176 (H) 65 - 99 mg/dL  Glucose, capillary Status: Abnormal   Collection Time: 11/26/15 10:08 PM  Result Value Ref Range   Glucose-Capillary 112 (H) 65 - 99 mg/dL   Comment 1 Notify RN    Comment 2 Document in Chart       Imaging Results (Last 48 hours)    Ct Head Wo Contrast  11/26/2015 CLINICAL DATA: Followup intracranial hemorrhage EXAM: CT HEAD WITHOUT CONTRAST TECHNIQUE: Contiguous axial images were obtained from the base of the skull through the vertex without intravenous contrast. COMPARISON: 11/08/2015 FINDINGS: No worsening or progressive change since the previous study. Some contraction of the hyperdense o'clock in the left posterior temporal/occipital region measuring maximally 5.5 x 2.5 cm today. Surrounding edema unchanged. Small amount of blood in the atrium and occipital horn of the left lateral ventricle unchanged. No change in ventricular size. Right-to-left shift measures 3 mm. No new abnormality. IMPRESSION: No new or worsening fighting. Expected evolutionary changes with some contraction of the hyperdense clot in the left posterior temporal and occipital region. Small amount of ventricular blood is the same. No change in ventricular size. No change in mass effect. Electronically Signed By: Paulina FusiMark Shogry M.D. On: 11/26/2015 08:49   Dg Chest Port 1 View  11/25/2015 CLINICAL DATA: Altered mental status with fever EXAM: PORTABLE CHEST 1 VIEW COMPARISON: November 24, 2015 FINDINGS: There is no edema or consolidation. Heart size and pulmonary vascularity are normal. No adenopathy. No bone  lesions. IMPRESSION: No edema or consolidation. Electronically Signed By: Bretta BangWilliam Woodruff III M.D. On: 11/25/2015 15:59     Assessment/Plan: Diagnosis: left ICH Labs and images independently reviewed. Records reviewed and summated above. Stroke: Continue secondary stroke prophylaxis and Risk Factor Modification listed below:  Blood Pressure Management: Continue current medication with prn's with permisive HTN per primary team  1. Does the need for close, 24 hr/day medical supervision in concert with the patient's rehab needs make it unreasonable for this patient to be served in a less intensive setting? Potentially  2. Co-Morbidities requiring supervision/potential complications: Encephalopathy (cont to orient and avoid cognitively altering meds), macular degeneration and subsequent blindness right eye, COPD/tobacco abuse (cont to monitor RR and O2 sats with increased activity, cont to counsel), Pre-diabetes (Monitor in accordance with exercise and adjust meds as necessary), hypokalemia (cont to monitor, replete as necessary), AKI (avoid nephrotoxic meds), anemia (transfuse if necessary to ensure appropriate perfusion for increased activity tolerance), depression (ensure mood does not limit functional progress) 3. Due to safety, disease management and patient education, does the patient require 24 hr/day rehab nursing? Potentially 4. Does the patient require coordinated care of a physician, rehab nurse, PT (1-2 hrs/day, 5 days/week), OT (1-2 hrs/day, 5 days/week) and SLP (1-2 hrs/day, 5 days/week) to address physical and functional deficits in the context of the above medical diagnosis(es)? Potentially Addressing deficits in the following areas: balance, endurance, locomotion, strength, transferring, toileting, cognition and psychosocial support 5. Can the patient actively participate in an intensive therapy program of at least 3 hrs of therapy per day at least 5 days per week?  Yes 6. The potential for patient to make  measurable gains while on inpatient rehab is good 7. Anticipated functional outcomes upon discharge from inpatient rehab are modified independent with PT, modified independent with OT, modified independent with SLP. 8. Estimated rehab length of stay to reach the above functional goals is: 10-14 days. 9. Does the patient have adequate social supports and living environment to accommodate these discharge functional goals? Yes 10. Anticipated D/C setting: Home 11. Anticipated post D/C treatments: HH therapy and Home excercise program 12. Overall Rehab/Functional Prognosis: excellent  RECOMMENDATIONS: This patient's condition is appropriate for continued rehabilitative care in the following setting: CIR after completion of medical workup  Patient has agreed to participate in recommended program. Yes Note that insurance prior authorization may be required for reimbursement for recommended care.  Comment: Rehab Admissions Coordinator to follow up.  Maryla Morrow, MD 11/27/2015       Revision History     Date/Time User Provider Type Action   11/27/2015 10:43 AM Ankit Karis Juba, MD Physician Sign   11/27/2015 6:57 AM Charlton Amor, PA-C Physician Assistant [facility] Details Report

## 2015-11-28 NOTE — H&P (Signed)
Physical Medicine and Rehabilitation Admission H&P    Chief Complaint  Patient presents with  . Confusion, Headaches and blurred vision.     HPI: David Duke is a 81 y.o. ambidextrous male with history of macular degeneration with blindness right eye, anxiety disorder, COPD/tobacco abuse. He was admitted from Dr. Dahlia Bailiff office with blurred vision left eye as well as confusion.  Blood pressure 145/98. Cranial CT scan showed acute intraparenchymal hemorrhage within the left lower parietal occipital lobe measuring 5.6 x 2.9 cm with surrounding edema causing slight mass effect without midline shift. Echocardiogram showed EF f 44% grade 1 diastolic dysfunction. EEG nonspecific finding indicating of mild diffuse cerebral dysfunction no seizure activity. Carotid Dopplers was negative for ICA stenosis. Patient did not receive TPA due to intracranial hemorrhage. Follow-up cranial CT scan 11/26/2015 showing expected evolutionary changes with some contraction of the hyperdense clot in the left posterior temporal occipital region. No change in mass effect.. Patient has been unable to tolerate attempts at MRI due to claustrophobia.  Agitation has resolved but he continues to have bouts of confusion. Dr. Leonie Man questioned CAA v/s hypertensive bleed.  He has had issues with worsening of HA as well as urinary retention requiring foley.  Had febrile episode with leucocytosis but no signs of infection on work up.  Therapy ongoing and patient with significant cognitive deficits,  balance deficits as well as generalize weakness affecting mobility. CIR was recommended for follow up therapy.   Review of Systems  HENT: Positive for hearing loss (deaf in left ear and decreased hearing in right).   Eyes: Positive for blurred vision (lacks central vision in right eye).  Respiratory: Positive for hemoptysis, shortness of breath and wheezing (get tight and wheezy at times. ). Negative for cough.        Nasal  congestion--worse in hospital  Cardiovascular: Negative for chest pain and palpitations.  Gastrointestinal: Positive for heartburn. Negative for nausea, abdominal pain and constipation.  Musculoskeletal: Positive for myalgias and neck pain. Negative for back pain.  Neurological: Positive for headaches. Negative for dizziness and tingling.  Psychiatric/Behavioral: Positive for memory loss.  All other systems reviewed and are negative.   Past Medical History  Diagnosis Date  . High cholesterol   . Depression   . Anxiety   . Macular degeneration of both eyes   . Blind right eye   . COPD (chronic obstructive pulmonary disease) ([place])   . Shortness of breath dyspnea     Past Surgical History  Procedure Laterality Date  . No past surgeries      Family History  Problem Relation Age of Onset  . Colon cancer Mother   . Cerebral aneurysm Father     Social History:  Married. Wife has seizures. He reports that he has been smoking Cigarettes--down to 1 pack every 2 weeks. Retired Warehouse manager.  He has a 30 pack-year smoking history. He does not have any smokeless tobacco history on file. He reports that he does not drink alcohol or use illicit drugs.    Allergies: No Known Allergies    Medications Prior to Admission  Medication Sig Dispense Refill  . albuterol (PROVENTIL HFA;VENTOLIN HFA) 108 (90 Base) MCG/ACT inhaler Inhale 2 puffs into the lungs every 6 (six) hours as needed for wheezing or shortness of breath.    . calcium-vitamin D (OSCAL WITH D) 500-200 MG-UNIT tablet Take 1 tablet by mouth daily with breakfast.    . diazepam (VALIUM) 5 MG tablet Take 5 mg  by mouth 3 (three) times daily as needed for anxiety.    . Omega-3 Fatty Acids (FISH OIL) 1000 MG CAPS Take 1,000 mg by mouth daily.    . promethazine-codeine (PHENERGAN WITH CODEINE) 6.25-10 MG/5ML syrup Take 5 mLs by mouth every 6 (six) hours as needed for cough.    . sertraline (ZOLOFT) 100 MG tablet Take 100 mg by mouth daily.    .  silodosin (RAPAFLO) 8 MG CAPS capsule Take 8 mg by mouth daily with breakfast.      Home: Home Living Family/patient expects to be discharged to:: Private residence Living Arrangements: Spouse/significant other Available Help at Discharge: Family, Available 24 hours/day Type of Home: House Home Access: Stairs to enter [employer] of Steps: 4 steps front entry, 8 steps steps back entry. 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  Requires assist to elevate trunk and, when returning to supine, assist managing LEs Transfers Overall transfer level: Needs assistance Equipment used: 1 person hand held assist Transfers: Sit to/from Stand, Stand Pivot Transfers Sit to Stand: Min assist Stand pivot transfers: Min assist General transfer comment: assist for balance Ambulation/Gait General Gait Details: no able to assess    ADL: ADL Overall ADL's : Needs assistance/impaired Eating/Feeding: Modified independent, Sitting Grooming: Wash/dry hands, Wash/dry face, Oral care, Brushing hair, Minimal assistance, Moderate assistance, Standing Upper Body Bathing: Minimal assitance, Sitting Lower Body Bathing: Moderate assistance, Sit to/from stand Upper Body Dressing : Minimal  assistance, Sitting Lower Body Dressing: Moderate assistance, Sit to/from stand Toilet Transfer: Minimal assistance, Stand-pivot, BSC Toileting- Clothing Manipulation and Hygiene: Minimal assistance, Sit to/from stand Functional mobility during ADLs: Minimal assistance General ADL Comments: Pt requires mod A due to being highly distractable and difficulty staying on task   Cognition: Cognition Overall Cognitive Status: Impaired/Different from baseline Arousal/Alertness: Awake/alert Orientation Level: Oriented X4 Attention: Focused, Sustained, Selective Focused Attention: Appears intact Sustained Attention: Appears intact Selective Attention: Impaired Selective Attention Impairment: Verbal basic Memory: Impaired Memory Impairment: Decreased recall of new information Awareness: Appears intact Executive Function: Sequencing, Organizing Sequencing: Impaired Sequencing Impairment: Verbal basic Organizing: Impaired Organizing Impairment: Verbal basic Behaviors: Other (comment) (tangential) Cognition Arousal/Alertness: Awake/alert Behavior During Therapy: WFL for tasks assessed/performed Overall Cognitive Status: Impaired/Different from baseline Area of Impairment: Orientation, Attention, Following commands, Safety/judgement, Problem solving, Awareness Orientation Level: Situation, Disoriented to, Time Current Attention Level: Sustained Memory: Decreased short-term memory Following Commands: Follows one step commands consistently Safety/Judgement: Decreased awareness of safety, Decreased awareness of deficits Awareness: Intellectual Problem Solving: Slow processing, Difficulty sequencing, Requires verbal cues, Requires tactile cues General Comments: Pt is histrionic and tangential.  He self distracts frequently - requires max cues to stay on task    Blood pressure 122/73, pulse 78, temperature 97.5 F (36.4 C), temperature source Oral, resp. rate 20, height _0  (1.702 m), weight  64.1 kg (141 lb 5 oz), SpO2 96 %. Physical Exam  Nursing note and vitals reviewed. Constitutional: He appears well-developed and well-nourished.  Elderly male in bed--verbose and distracted needing redirection to stay focused.   HENT:  Head: Normocephalic and atraumatic.  Eyes: Conjunctivae and EOM are normal. Pupils are equal, round, and reactive to light.  Neck: Normal range of motion. Neck supple.  Cardiovascular: Normal rate and regular rhythm.   No murmur heard. Respiratory: Effort normal. No respiratory distress. He has wheezes in the left lower field.  GI: Soft. Bowel sounds are normal. He exhibits no distension. There is no tenderness.  Musculoskeletal: He exhibits no edema or tenderness.  Neurological: He is alert.  Has poor safety awareness.  Distracted and perseverative needing redirection to follow commands.  Mild ataxia with BUE.  A&Ox2 Sensation intact to light touch 3+ DTRs RUE/RLE Motor: B/l UE: 5/5 proximal to distal B/l LE 5/5 proximal to distal   Skin: Skin is warm and dry. No rash noted. No erythema.  Psychiatric: His speech is tangential. Cognition and memory are impaired.  Behaviors agitated and slowed at times    Results for orders placed or performed during the hospital encounter of 11/22/15 (from the past 48 hour(s))  Glucose, capillary     Status: Abnormal   Collection Time: 11/26/15  3:20 PM  Result Value Ref Range   Glucose-Capillary 176 (H) 65 - 99 mg/dL  Glucose, capillary     Status: Abnormal   Collection Time: 11/26/15 10:08 PM  Result Value Ref Range   Glucose-Capillary 112 (H) 65 - 99 mg/dL   Comment 1 Notify RN    Comment 2 Document in Chart   Lactic acid, plasma     Status: None   Collection Time: 11/27/15  5:30 AM  Result Value Ref Range   Lactic Acid, Venous 1.4 0.5 - 2.0 mmol/L  CBC     Status: Abnormal   Collection Time: 11/27/15  6:15 AM  Result Value Ref Range   WBC 9.5 4.0 - 10.5 K/uL   RBC 4.20 (L) 4.22 - 5.81 MIL/uL    Hemoglobin 12.3 (L) 13.0 - 17.0 g/dL   HCT 37.4 (L) 39.0 - 52.0 %   MCV 89.0 78.0 - 100.0 fL   MCH 29.3 26.0 - 34.0 pg   MCHC 32.9 30.0 - 36.0 g/dL   RDW 13.1 11.5 - 15.5 %   Platelets 177 150 - 400 K/uL  Basic metabolic panel     Status: Abnormal   Collection Time: 11/27/15  6:15 AM  Result Value Ref Range   Sodium 139 135 - 145 mmol/L   Potassium 3.2 (L) 3.5 - 5.1 mmol/L   Chloride 103 101 - 111 mmol/L   CO2 24 22 - 32 mmol/L   Glucose, Bld 109 (H) 65 - 99 mg/dL   BUN 14 6 - 20 mg/dL   Creatinine, Ser 1.41 (H) 0.61 - 1.24 mg/dL   Calcium 8.3 (L) 8.9 - 10.3 mg/dL   GFR calc non Af Amer 46 (L) >60 mL/min   GFR calc Af Amer 54 (L) >60 mL/min    Comment: (NOTE) The eGFR has been calculated using the CKD EPI equation. This calculation has not been validated in all clinical situations. eGFR's persistently <60 mL/min signify possible Chronic Kidney Disease.    Anion gap 12 5 - 15  Glucose, capillary     Status: Abnormal   Collection Time: 11/27/15  6:47 AM  Result Value Ref Range   Glucose-Capillary 107 (H) 65 - 99 mg/dL   Comment 1 Notify  RN    Comment 2 Document in Chart   Glucose, capillary     Status: Abnormal   Collection Time: 11/27/15 11:12 AM  Result Value Ref Range   Glucose-Capillary 124 (H) 65 - 99 mg/dL   Comment 1 Notify RN    Comment 2 Document in Chart   Glucose, capillary     Status: Abnormal   Collection Time: 11/27/15  4:31 PM  Result Value Ref Range   Glucose-Capillary 116 (H) 65 - 99 mg/dL  Glucose, capillary     Status: Abnormal   Collection Time: 11/28/15  6:35 AM  Result Value Ref Range   Glucose-Capillary 106 (H) 65 - 99 mg/dL   Comment 1 Notify RN    Comment 2 Document in Chart   Glucose, capillary     Status: None   Collection Time: 11/28/15 11:29 AM  Result Value Ref Range   Glucose-Capillary 99 65 - 99 mg/dL   Comment 1 Notify RN    Comment 2 Document in Chart    No results found.     Medical Problem List and Plan: 1.  Balance  deficits and poor cognition secondary to left ICH. 2.  DVT Prophylaxis/Anticoagulation: Mechanical: Sequential compression devices, below knee Bilateral lower extremities 3. Pain Management: Tylenol prn for pain 4. Mood: LCSW to follow for evaluation and support.  5. Neuropsych: This patient is not fully capable of making decisions on his own behalf. 6. Skin/Wound Care: routine pressure relief measures. 7. Fluids/Electrolytes/Nutrition: Monitor I/O. Check lytes in am. 8. H/o BPH/Urinary retention: Continue foley for now. Check UA/UCS. Change flomax to rapaflo. .  9. Depression/PTSD/anxiety:  On Zoloft daily with valium prn.  10. COPD:  Uses albuterol daily due to "tightness' Also has issues with congestion.  Currently perseverating on respiratory symptoms --will check CXR. Schedule nebs for now.  11. Headaches: Gets one "every afternoon".  Continue Fioricet.  Will add Claritin and Flonase to help with nasal/sinus congestion.  12. Sundowning: wife reporting intermittent confusion worse in the evenings.  13. Prediabetes: Periodically monitor 14. AKI: continue to monitor. Avoid nephrotoxic meds 15: Hypokalemia: Will continue to monitor   Post Admission Physician Evaluation: 1. Functional deficits secondary  to left ICH . 2. Patient is admitted to receive collaborative, interdisciplinary care between the physiatrist, rehab nursing staff, and therapy team. 3. Patient's level of medical complexity and substantial therapy needs in context of that medical necessity cannot be provided at a lesser intensity of care such as a SNF. 4. Patient has experienced substantial functional loss from his/her baseline which was documented above under the "Functional History" and "Functional Status" headings.  Judging by the patient's diagnosis, physical exam, and functional history, the patient has potential for functional progress which will result in measurable gains while on inpatient rehab.  These gains will be of  substantial and practical use upon discharge  in facilitating mobility and self-care at the household level. 5. Physiatrist will provide 24 hour management of medical needs as well as oversight of the therapy plan/treatment and provide guidance as appropriate regarding the interaction of the two. 6. 24 hour rehab nursing will assist with safety, disease management and patient education and help integrate therapy concepts, techniques,education, etc. 7. PT will assess and treat for/with: Lower extremity strength, range of motion, stamina, balance, functional mobility, safety, adaptive techniques and equipment, coping skills, pain control, stroke education.   Goals are: Mod I. 8. OT will assess and treat for/with: ADL's, functional mobility, safety, upper extremity strength, adaptive  techniques and equipment, ego support, and community reintegration.   Goals are: Mod I. Therapy may proceed with showering this patient. 9. SLP will assess and treat for/with: speech, language, higher level cognition.  Goals are: Mod I/Supervision. 10. Case Management and Social Worker will assess and treat for psychological issues and discharge planning. 11. Team conference will be held weekly to assess progress toward goals and to determine barriers to discharge. 12. Patient will receive at least 3 hours of therapy per day at least 5 days per week. 13. ELOS: 8-11 days.       14. Prognosis:  good  Delice Lesch, MD 11/28/2015

## 2015-11-28 NOTE — Interval H&P Note (Signed)
David BarlowDavid R Duke was admitted today to Inpatient Rehabilitation with the diagnosis of left ICH.  The patient's history has been reviewed, patient examined, and there is no change in status.  Patient continues to be appropriate for intensive inpatient rehabilitation.  I have reviewed the patient's chart and labs.  Questions were answered to the patient's satisfaction. The PAPE has been reviewed and assessment remains appropriate.  Warden Buffa Karis Jubanil Erva Koke 11/28/2015, 5:24 PM

## 2015-11-28 NOTE — Progress Notes (Signed)
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 Date: 09/09/15 Deduct: none Out of Pocket Max: $4900 Life Max: none CIR: $345 per day days 1-5 then covers 100% SNF: no copay days 1-20; $160 copay days 21-51 no copay days 52-100 Outpatient: $40 copay per visit Co-Pay: no visit limit Home Health: 100% Co-Pay: no visit limit DME: 80% Co-Pay: 20% Providers: in network  SECONDARY: none  Medicaid Application Date: Case Manager:  Disability Application Date: Case Worker:   Emergency Contact Information Contact Information    Name Relation Home Work Mobile   [place] Spouse [phone]     Raye, Slyter   [phone]     Current Medical History  Patient Admitting Diagnosis: left ICH  History of Present Illness:David Duke is a 81 y.o.right handed male with history of macular degeneration and subsequent blindness right eye,Severe anxiety, COPD/tobacco abuse. Presented 11/22/2015 with blurred vision left eye as well as altered mental status reported by his wife. Blood pressure 145/98. Cranial CT scan showed acute intraparenchymal hemorrhage within the left lower parietal occipital lobe measuring 5.6 x 2.9 cm with  surrounding edema causing slight mass effect without midline shift. Echocardiogram with ejection fraction of 70% grade 1 diastolic dysfunction. EEG nonspecific finding indicating of mild diffuse cerebral dysfunction no seizure activity. Carotid Dopplers with no ICA stenosis. Patient did not receive TPA due to intracranial hemorrhage. Neurology consulted With follow-up cranial CT scan 11/26/2015 showing expected evolutionary changes with some contraction of the hyperdense clot in the left posterior temporal occipital region. No change in mass effect.. Attempts had been made for MRI but patient not able to tolerate even when dosed with diazepam. Close monitoring of blood pressure. Maintain on a regular diet.  Total: 4 NIH    Past Medical History  Past Medical History  Diagnosis Date  . High cholesterol   . Depression   . Anxiety   . Macular degeneration of both eyes   . Blind right eye   . COPD (chronic obstructive pulmonary disease) (HCC)   . Shortness of breath dyspnea     Family History  family history includes Cerebral aneurysm in his father; Colon cancer in his mother.  Prior Rehab/Hospitalizations:  Has the patient had major surgery during 100 days prior to admission? No  Current Medications   Current facility-administered medications:  . stroke: mapping our early stages of recovery book, , Does not apply, Once, Ram Daniel Nones, MD . acetaminophen (TYLENOL) tablet 650 mg, 650 mg, Oral, Q6H PRN, 650 mg at 11/26/15 1402 **OR** [DISCONTINUED] acetaminophen (TYLENOL) suppository 650 mg, 650 mg, Rectal, Q4H PRN, Ram Daniel Nones, MD . albuterol (PROVENTIL) (2.5 MG/3ML) 0.083% nebulizer solution 2.5 mg, 2.5 mg, Nebulization, BID, Micki Riley, MD, 2.5 mg at 11/28/15 0936 . albuterol (PROVENTIL) (2.5 MG/3ML) 0.083% nebulizer solution 3 mL, 3 mL, Inhalation, Q6H PRN, Kinnie Scales Rinehuls, PA-C, 3 mL at 11/27/15 1838 . alum & mag  hydroxide-simeth (MAALOX/MYLANTA) 200-200-20 MG/5ML suspension 30 mL, 30 mL, Oral, Q6H PRN, Micki Riley, MD, 30 mL at 11/28/15 1049 . atorvastatin (LIPITOR)  tablet 20 mg, 20 mg, Oral, q1800, Marvel PlanJindong Xu, MD, 20 mg at 11/27/15 1638 . butalbital-acetaminophen-caffeine (FIORICET, ESGIC) 50-325-40 MG per tablet 1 tablet, 1 tablet, Oral, Q8H PRN, Kinnie Scalesavid L Rinehuls, PA-C, 1 tablet at 11/28/15 0322 . calcium-vitamin D (OSCAL WITH D) 500-200 MG-UNIT per tablet 1 tablet, 1 tablet, Oral, Q breakfast, Micki RileyPramod S Sethi, MD, 1 tablet at 11/28/15 0936 . diazepam (VALIUM) tablet 5 mg, 5 mg, Oral, Q12H, Calvert CantorSaima Rizwan, MD, 5 mg at 11/28/15 0935 . ondansetron (ZOFRAN) injection 4 mg, 4 mg, Intravenous, Q8H PRN, Caedyn L Rinehuls, PA-C, 4 mg at 11/23/15 1035 . pantoprazole (PROTONIX) EC tablet 40 mg, 40 mg, Oral, QHS, Faye RamsayRachel L Rumbarger, RPH, 40 mg at 11/27/15 2327 . senna-docusate (Senokot-S) tablet 1 tablet, 1 tablet, Oral, BID, Ram Daniel NonesNarayan Kaveer Nandigam, MD, 1 tablet at 11/28/15 [phone] . sertraline (ZOLOFT) tablet 100 mg, 100 mg, Oral, Daily, Edoardo L Rinehuls, PA-C, 100 mg at 11/28/15 0936 . tamsulosin (FLOMAX) capsule 0.4 mg, 0.4 mg, Oral, Daily, Everette L Rinehuls, PA-C, 0.4 mg at 11/28/15 0935  Patients Current Diet: Diet regular Room service appropriate?: Yes; Fluid consistency:: Thin  Precautions / Restrictions Precautions Precautions: Fall Precaution Comments: check BP Restrictions Weight Bearing Restrictions: No   Has the patient had 2 or more falls or a fall with injury in the past year?No  Prior Activity Level Limited Community (1-2x/wk): Drive and Independent pta. 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 Independent  Indoor Mobility: Did the patient need assistance with walking from room to room (with or without device)? Independent  Stairs: Did the patient need assistance with internal or external stairs (with or without device)? Independent  Functional Cognition: Did the patient need help planning regular tasks such as shopping or remembering to take medications? Independent  Current Functional Level Cognition  Arousal/Alertness: Awake/alert Overall Cognitive Status: Impaired/Different from baseline Current Attention Level: Sustained Orientation Level: Oriented X4 Following Commands: Follows one step commands consistently Safety/Judgement: Decreased awareness of safety, Decreased awareness of deficits General Comments: Pt is histrionic and tangential. He self distracts frequently - requires max cues to stay on task  Attention: Focused, Sustained, Selective Focused Attention: Appears intact Sustained Attention: Appears intact Selective Attention: Impaired Selective Attention Impairment: Verbal basic Memory: Impaired Memory Impairment: Decreased recall of new information Awareness: Appears intact Executive Function: Sequencing, Organizing Sequencing: Impaired Sequencing Impairment: Verbal basic Organizing: Impaired Organizing Impairment: Verbal basic Behaviors: Other (comment) (tangential)   Extremity Assessment (includes Sensation/Coordination)  Upper Extremity Assessment: Overall WFL for tasks assessed  Lower Extremity Assessment: Defer to PT evaluation RLE Deficits / Details: not formally tested but pt able to stand EOB w/ 1 person HHA and able to perform SLR LLE Deficits / Details: not  formally tested but pt able to stand EOB w/ 1 person HHA and able to perform SLR    ADLs  Overall ADL's : Needs assistance/impaired Eating/Feeding: Modified independent, Sitting Grooming: Wash/dry hands, Wash/dry face, Oral care, Brushing hair, Minimal assistance, Moderate assistance, Standing Upper Body Bathing: Minimal assitance,  Sitting Lower Body Bathing: Moderate assistance, Sit to/from stand Upper Body Dressing : Minimal assistance, Sitting Lower Body Dressing: Moderate assistance, Sit to/from stand Toilet Transfer: Minimal assistance, Stand-pivot, BSC Toileting- Clothing Manipulation and Hygiene: Minimal assistance, Sit to/from stand Functional mobility during ADLs: Minimal assistance General ADL Comments: Pt requires mod A due to being highly distractable and difficulty staying on task     Mobility  Overal bed mobility: Needs Assistance, +2 for physical assistance Bed Mobility: Supine to Sit, Sit to Supine Supine to sit: Min guard Sit to supine: Min guard General bed mobility comments: Simple commands to have pt sit EOB. Requires assist to elevate trunk and, when returning to supine, assist managing LEs    Transfers  Overall transfer level: Needs assistance Equipment used: 1 person hand held assist Transfers: Sit to/from Stand, Stand Pivot Transfers Sit to Stand: Min assist Stand pivot transfers: Min assist General transfer comment: assist for balance    Ambulation / Gait / Stairs / Wheelchair Mobility  Ambulation/Gait General Gait Details: no able to assess    Posture / Balance Balance Overall balance assessment: Needs assistance Sitting-balance support: No upper extremity supported Sitting balance-Leahy Scale: Good Standing balance support: During functional activity Standing balance-Leahy Scale: Poor Standing balance comment: Relies on UE support    Special needs/care consideration   Bowel mgmt: LBM 3/21  continent Bladder mgmt: foley   Previous Home Environment Living Arrangements: Spouse/significant other Lives With: Spouse Available Help at Discharge: Family, Available 24 hours/day Type of Home: House Home Layout: One level Home Access: Stairs to enter Entrance Stairs-Rails: Right, Left, Can reach both Entrance Stairs-Number of Steps: 4 steps front entry, 8 steps steps back entry. Both with rails  Bathroom Shower/Tub: Tub/shower unit, Engineer, building services: Standard Bathroom Accessibility: Yes How Accessible: Accessible via walker Home Care Services: No Additional Comments: wife states pt confused, easily irritated, can be demanding by nature  Discharge Living Setting Plans for Discharge Living Setting: Patient's home, Lives with (comment) (wife) Type of Home at Discharge: House Discharge Home Layout: One level Discharge Home Access: Stairs to enter Entrance Stairs-Rails: Right, Left, Can reach both Entrance Stairs-Number of Steps: 4 steps front entry and 8 steps back entry, both with rails Discharge Bathroom Shower/Tub: Tub/shower unit, Curtain Discharge Bathroom Toilet: Standard Discharge Bathroom Accessibility: Yes How Accessible: Accessible via walker Does the patient have any problems obtaining your medications?: No  Social/Family/Support Systems Patient Roles: Spouse, Parent Contact Information: [name], wife Anticipated Caregiver: wife Anticipated Industrial/product designer Information: see above Ability/Limitations of Caregiver: wife unemployed, 32 yo. Does not drive due to epilepsy Caregiver Availability: 24/7 Discharge Plan Discussed with Primary Caregiver: Yes Is Caregiver In Agreement with Plan?: Yes Does Caregiver/Family have Issues with Lodging/Transportation while Pt is in Rehab?: No (sife stays with pt nearly 24/7. Has to get someone to drive )  Wife is 31 yo and they have been married 26 years. She does not drive due to driving restrictions with her  epilepsy.  Goals/Additional Needs Patient/Family Goal for Rehab: supervision with PT, OT, and SLP Expected length of stay: ELOS 10- 14 days Equipment Needs: Bed and chair alarm Pt/Family Agrees to Admission and willing to participate: Yes Program Orientation Provided & Reviewed with Pt/Caregiver Including Roles & Responsibilities: Yes  Decrease burden of Care through IP rehab admission: n/a  Possible need for SNF placement upon discharge: not anticipated  Patient Condition: This patient's condition remains as documented in the consult dated 11/27/2015, in which the Rehabilitation Physician determined and documented that  the patient's condition is appropriate for intensive rehabilitative care in an inpatient rehabilitation facility. Will admit to inpatient rehab today.  Preadmission Screen Completed By: Clois Dupes, 11/28/2015 12:01 PM ______________________________________________________________________  Discussed status with Dr. Allena Katz on 11/28/2015 at 1201 and received telephone approval for admission today.  Admission Coordinator: Clois Dupes, time 4540 Date 11/28/2015          Cosigned by: Ankit Karis Juba, MD at 11/28/2015 12:22 PM  Revision History     Date/Time User Provider Type Action   11/28/2015 12:22 PM Ankit Karis Juba, MD Physician Cosign   11/28/2015 12:02 PM Standley Brooking, RN Rehab Admission Coordinator Sign

## 2015-11-28 NOTE — Discharge Summary (Signed)
Physician Discharge Summary  Patient ID: David BarlowDavid R Kindler MRN: [id number] DOB/AGE: 79/05/1935 79 y.o.  Admit date: 11/22/2015 Discharge date: 11/28/2015  Admission Diagnoses:Code stroke  Discharge Diagnoses: Left parieto-occipital parenchymal hemorrhage etiology indeterminate possibly amyloid angiopathy versus hypertensive with cytotoxic edema and mass effect. Principal Problem:   Acute encephalopathy Active Problems:   ICH (intracerebral hemorrhage) (HCC)   Hyponatremia   Acute kidney injury (HCC)   Depression   Anxiety   COPD (chronic obstructive pulmonary disease) (HCC)   Aphasia   Macular degeneration   Prediabetes   Hypokalemia   Absolute anemia   Discharged Condition: fair  Hospital Course: David BarlowDavid R Pilar is an 81 y.o. 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 Patient reports that earlier this afternoon he developed some blurry vision in his left eye. He denies any weakness or speech disturbances, numbness, protruding posterior or gait problems. He went to his ophthalmologist's abdominal office to get an exam and was concerned for stroke, so sent the patient here for evaluation. Patient's wife additionally reports that the patient seemed confused earlier in the day today. He denies any recent illnesses including fever, headache, chest pain, shortness of breath, abdominal pain, nausea, vomiting, diarrhea, urinary frequency. No prior history of stroke. He is not on blood thinners. No recent head trauma. Date last known well: 11/22/2015 Time last known well: Some time this morning, time of onset unknown tPA Given: No: Intracerebral hemorrhage ICH score: 1 Stroke Risk Factors - hypertension He was admitted to intensive care unit where blood pressure was tightly controlled. He had close neurological monitoring where he remained stable. CT scan of the head on  follow-up shows stable appearance of the hematoma with some extension the occipital horn but no hydrocephalus. There is stable for him to right midline shift. Patient wasn't able to complete an MRI and    n EEG showed mild diffuse slowing without definite epileptiform activity. He remained confused for a few days initially but then gradually his mental status improved and he was calm and cooperative. He did have a persistent dense right,: homonymous hemianopsia and slightly disoriented. He was seen by physical occupational therapy and felt to be a good candidate for inpatient rehabilitation and was transferred to rehabilitation in stable condition. He was advised not to drive. The family was informed that patient would need an eventual outpatient MRI scan upon follow-up in 2 months to look for possible underlying vascular lesions explaining his hemorrhage and this could be done in an open scanner Consults: rehabilitation medicine  Significant Diagnostic Studies:   Ct Head Wo Contrast 11/26/2015  No new or worsening fighting. Expected evolutionary changes with some contraction of the hyperdense clot in the left posterior temporal and occipital region. Small amount of ventricular blood is the same. No change in ventricular size. No change in mass effect.   Ct Head Wo Contrast 11/24/2015  1. Stable to slightly decreased left occipital lobe intraparenchymal hemorrhage with extension into the occipital horn of the left lateral ventricle. Stable sulcal effacement in the left parieto-occipital lobe. 2. Stable 4 mm right midline shift. Basilar cisterns remain patent. 3. Underlying cerebral volume loss and mild chronic small vessel ischemia.   Ct Head Wo Contrast 11/23/2015 1. Mild enlargement of the left occipital lobe epicenter intra-axial hemorrhage, with estimated blood volume now of 46 mL. Stable surrounding edema and mild regional mass effect. 2. Small volume of intraventricular and subdural  extension of blood is stable since yesterday. No ventriculomegaly. 3. No new intracranial abnormality identified.   Ct Head Wo Contrast 11/22/2015  1. Acute intraparenchymal hemorrhage within the lower left parietal-occipital lobe, measuring 5.6 x 2.9 cm, with surrounding edema causing slight mass effect without midline shift or herniation. Hemorrhage extends into the adjacent posterior horn of the left lateral ventricle. Additional thin hemorrhage along the adjacent posterior falx and underlying tentorium.  2. Mild chronic small vessel ischemic change within the periventricular white matter.    Ct Head Wo Contrast 11/23/2015 1. Mild enlargement of the left occipital lobe epicenter intra-axial hemorrhage, with estimated blood volume now of 46 mL. Stable surrounding edema and mild regional mass effect. 2. Small volume of intraventricular and subdural extension of blood is stable since yesterday. No ventriculomegaly. 3. No new intracranial abnormality identified.  TTE  Study Conclusions - Left ventricle: The cavity size was normal. Wall thickness was  normal. Systolic function was vigorous. The estimated ejection  fraction was in the range of 65% to 70%. Wall motion was normal;  there were no regional wall motion abnormalities. Doppler  parameters are consistent with abnormal left ventricular  relaxation (grade 1 diastolic dysfunction). - Left atrium: The atrium was mildly dilated. Impressions: - Extremely limited due to poor sound wave transmission; vigorous  LV function; grade 1 diastolic dysfunction; mild LAE.   CUS  Bilateral: 1-39% ICA stenosis. Vertebral artery flow is antegrade Discharge Exam: Blood pressure 128/60, pulse 75, temperature 98.9 F (37.2 C), temperature source Oral, resp. rate 20, height  (1.702 m), weight 141 lb 5 oz (64.1 kg), SpO2 94 %.  General - Well nourished, well developed elderly Caucasian male not in distress  Ophthalmologic - Fundi  not visualized due to noncooperation.  Cardiovascular - Regular rate and rhythm..  Mental Status -  Patient is Awake alertt following commands,   Cranial Nerves II - XII - II - Right eye pupil surgical, left round and reactive.  III, IV, VI - Extraocular movements intact. Right eye poor vision acuity left eye nasal field cut V - Facial sensation intact bilaterally. VII - Facial movement intact bilaterally. VIII - Hearing impaired  X - Palate elevates symmetrically. XI - shoulder shrug intact bilaterally. XII - Tongue protrusion intact.  Motor Strength - Difficul to fully assess due to confusion, but appears all limbs are antigravity and equal. Bulk was normal and fasciculations were absent.  Motor Tone - Muscle tone was assessed at the neck and appendages and was normal.  Reflexes - The patient's reflexes were 1+ in all extremities and he had no pathological reflexes.  Sensory - Deifficult to assess due to confusion, moved all limbs to stim.  Coordination - The patient had normal movements in the hands with no apparent ataxia or dysmetria. Tremor was absent.  Toes: downgoing bilat  Gait and Station - can stand unassisted. However gait not tested due to safety concerns   Disposition: 62-Rehab Facility  Discharge Instructions    Ambulatory referral to Neurology    Complete by:  As directed   Please schedule post stroke follow up in 2 months.           Home Discharge Medications   Medication List    TAKE these medications        albuterol 108 (90 Base) MCG/ACT inhaler  Commonly known as:  PROVENTIL HFA;VENTOLIN HFA  Inhale 2 puffs into the lungs every 6 (six) hours as needed for wheezing or shortness of breath.  calcium-vitamin D 500-200 MG-UNIT tablet  Commonly known as:  OSCAL WITH D  Take 1 tablet by mouth daily with breakfast.     diazepam 5 MG tablet  Commonly known as:  VALIUM  Take 5 mg by mouth 3 (three) times daily as needed for anxiety.      Fish Oil 1000 MG Caps  Take 1,000 mg by mouth daily.     promethazine-codeine 6.25-10 MG/5ML syrup  Commonly known as:  PHENERGAN with CODEINE  Take 5 mLs by mouth every 6 (six) hours as needed for cough.     sertraline 100 MG tablet  Commonly known as:  ZOLOFT  Take 100 mg by mouth daily.     silodosin 8 MG Caps capsule  Commonly known as:  RAPAFLO  Take 8 mg by mouth daily with breakfast.        If d/c to Inpatient Rehab, Medications to to continued on Rehab . albuterol  2.5 mg Nebulization QID  . atorvastatin  20 mg Oral q1800  . [START ON 11/29/2015] calcium-vitamin D  1 tablet Oral Q breakfast  . diazepam  5 mg Oral Q12H  . fluticasone  1 spray Each Nare Daily  . loratadine  10 mg Oral Daily  . NON FORMULARY 8 mg  8 mg Oral Q2000  . pantoprazole  40 mg Oral QHS  . potassium chloride  30 mEq Oral Once  . senna-docusate  1 tablet Oral BID  . [START ON 11/29/2015] sertraline  100 mg Oral Daily        Follow-up Information    Follow up with Cadee Agro, MD In 2 months.   Specialties:  Neurology, Radiology   Why:  Stroke Clinic, Office will call you with appointment date & time   Contact information:   [address] [place] [address] [phone]     Total time spent on discharge summary 35 minutes  Signed: Vonceil Upshur 11/28/2015, 4:36 PM

## 2015-11-28 NOTE — H&P (View-Only) (Signed)
Physical Medicine and Rehabilitation Admission H&P    Chief Complaint  Patient presents with  . Confusion, Headaches and blurred vision.     HPI: David Duke is a 81 y.o. ambidextrous male with history of macular degeneration with blindness right eye, anxiety disorder, COPD/tobacco abuse. He was admitted from Dr. Dahlia Bailiff office with blurred vision left eye as well as confusion.  Blood pressure 145/98. Cranial CT scan showed acute intraparenchymal hemorrhage within the left lower parietal occipital lobe measuring 5.6 x 2.9 cm with surrounding edema causing slight mass effect without midline shift. Echocardiogram showed EF f 44% grade 1 diastolic dysfunction. EEG nonspecific finding indicating of mild diffuse cerebral dysfunction no seizure activity. Carotid Dopplers was negative for ICA stenosis. Patient did not receive TPA due to intracranial hemorrhage. Follow-up cranial CT scan 11/26/2015 showing expected evolutionary changes with some contraction of the hyperdense clot in the left posterior temporal occipital region. No change in mass effect.. Patient has been unable to tolerate attempts at MRI due to claustrophobia.  Agitation has resolved but he continues to have bouts of confusion. Dr. Leonie Man questioned CAA v/s hypertensive bleed.  He has had issues with worsening of HA as well as urinary retention requiring foley.  Had febrile episode with leucocytosis but no signs of infection on work up.  Therapy ongoing and patient with significant cognitive deficits,  balance deficits as well as generalize weakness affecting mobility. CIR was recommended for follow up therapy.   Review of Systems  HENT: Positive for hearing loss (deaf in left ear and decreased hearing in right).   Eyes: Positive for blurred vision (lacks central vision in right eye).  Respiratory: Positive for hemoptysis, shortness of breath and wheezing (get tight and wheezy at times. ). Negative for cough.        Nasal  congestion--worse in hospital  Cardiovascular: Negative for chest pain and palpitations.  Gastrointestinal: Positive for heartburn. Negative for nausea, abdominal pain and constipation.  Musculoskeletal: Positive for myalgias and neck pain. Negative for back pain.  Neurological: Positive for headaches. Negative for dizziness and tingling.  Psychiatric/Behavioral: Positive for memory loss.  All other systems reviewed and are negative.   Past Medical History  Diagnosis Date  . High cholesterol   . Depression   . Anxiety   . Macular degeneration of both eyes   . Blind right eye   . COPD (chronic obstructive pulmonary disease) ([place])   . Shortness of breath dyspnea     Past Surgical History  Procedure Laterality Date  . No past surgeries      Family History  Problem Relation Age of Onset  . Colon cancer Mother   . Cerebral aneurysm Father     Social History:  Married. Wife has seizures. He reports that he has been smoking Cigarettes--down to 1 pack every 2 weeks. Retired Warehouse manager.  He has a 30 pack-year smoking history. He does not have any smokeless tobacco history on file. He reports that he does not drink alcohol or use illicit drugs.    Allergies: No Known Allergies    Medications Prior to Admission  Medication Sig Dispense Refill  . albuterol (PROVENTIL HFA;VENTOLIN HFA) 108 (90 Base) MCG/ACT inhaler Inhale 2 puffs into the lungs every 6 (six) hours as needed for wheezing or shortness of breath.    . calcium-vitamin D (OSCAL WITH D) 500-200 MG-UNIT tablet Take 1 tablet by mouth daily with breakfast.    . diazepam (VALIUM) 5 MG tablet Take 5 mg  by mouth 3 (three) times daily as needed for anxiety.    . Omega-3 Fatty Acids (FISH OIL) 1000 MG CAPS Take 1,000 mg by mouth daily.    . promethazine-codeine (PHENERGAN WITH CODEINE) 6.25-10 MG/5ML syrup Take 5 mLs by mouth every 6 (six) hours as needed for cough.    . sertraline (ZOLOFT) 100 MG tablet Take 100 mg by mouth daily.    .  silodosin (RAPAFLO) 8 MG CAPS capsule Take 8 mg by mouth daily with breakfast.      Home: Home Living Family/patient expects to be discharged to:: Private residence Living Arrangements: Spouse/significant other Available Help at Discharge: Family, Available 24 hours/day Type of Home: House Home Access: Stairs to enter [employer] of Steps: 4 steps front entry, 8 steps steps back entry. 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  Requires assist to elevate trunk and, when returning to supine, assist managing LEs Transfers Overall transfer level: Needs assistance Equipment used: 1 person hand held assist Transfers: Sit to/from Stand, Stand Pivot Transfers Sit to Stand: Min assist Stand pivot transfers: Min assist General transfer comment: assist for balance Ambulation/Gait General Gait Details: no able to assess    ADL: ADL Overall ADL's : Needs assistance/impaired Eating/Feeding: Modified independent, Sitting Grooming: Wash/dry hands, Wash/dry face, Oral care, Brushing hair, Minimal assistance, Moderate assistance, Standing Upper Body Bathing: Minimal assitance, Sitting Lower Body Bathing: Moderate assistance, Sit to/from stand Upper Body Dressing : Minimal  assistance, Sitting Lower Body Dressing: Moderate assistance, Sit to/from stand Toilet Transfer: Minimal assistance, Stand-pivot, BSC Toileting- Clothing Manipulation and Hygiene: Minimal assistance, Sit to/from stand Functional mobility during ADLs: Minimal assistance General ADL Comments: Pt requires mod A due to being highly distractable and difficulty staying on task   Cognition: Cognition Overall Cognitive Status: Impaired/Different from baseline Arousal/Alertness: Awake/alert Orientation Level: Oriented X4 Attention: Focused, Sustained, Selective Focused Attention: Appears intact Sustained Attention: Appears intact Selective Attention: Impaired Selective Attention Impairment: Verbal basic Memory: Impaired Memory Impairment: Decreased recall of new information Awareness: Appears intact Executive Function: Sequencing, Organizing Sequencing: Impaired Sequencing Impairment: Verbal basic Organizing: Impaired Organizing Impairment: Verbal basic Behaviors: Other (comment) (tangential) Cognition Arousal/Alertness: Awake/alert Behavior During Therapy: WFL for tasks assessed/performed Overall Cognitive Status: Impaired/Different from baseline Area of Impairment: Orientation, Attention, Following commands, Safety/judgement, Problem solving, Awareness Orientation Level: Situation, Disoriented to, Time Current Attention Level: Sustained Memory: Decreased short-term memory Following Commands: Follows one step commands consistently Safety/Judgement: Decreased awareness of safety, Decreased awareness of deficits Awareness: Intellectual Problem Solving: Slow processing, Difficulty sequencing, Requires verbal cues, Requires tactile cues General Comments: Pt is histrionic and tangential.  He self distracts frequently - requires max cues to stay on task    Blood pressure 122/73, pulse 78, temperature 97.5 F (36.4 C), temperature source Oral, resp. rate 20, height _0  (1.702 m), weight  64.1 kg (141 lb 5 oz), SpO2 96 %. Physical Exam  Nursing note and vitals reviewed. Constitutional: He appears well-developed and well-nourished.  Elderly male in bed--verbose and distracted needing redirection to stay focused.   HENT:  Head: Normocephalic and atraumatic.  Eyes: Conjunctivae and EOM are normal. Pupils are equal, round, and reactive to light.  Neck: Normal range of motion. Neck supple.  Cardiovascular: Normal rate and regular rhythm.   No murmur heard. Respiratory: Effort normal. No respiratory distress. He has wheezes in the left lower field.  GI: Soft. Bowel sounds are normal. He exhibits no distension. There is no tenderness.  Musculoskeletal: He exhibits no edema or tenderness.  Neurological: He is alert.  Has poor safety awareness.  Distracted and perseverative needing redirection to follow commands.  Mild ataxia with BUE.  A&Ox2 Sensation intact to light touch 3+ DTRs RUE/RLE Motor: B/l UE: 5/5 proximal to distal B/l LE 5/5 proximal to distal   Skin: Skin is warm and dry. No rash noted. No erythema.  Psychiatric: His speech is tangential. Cognition and memory are impaired.  Behaviors agitated and slowed at times    Results for orders placed or performed during the hospital encounter of 11/22/15 (from the past 48 hour(s))  Glucose, capillary     Status: Abnormal   Collection Time: 11/26/15  3:20 PM  Result Value Ref Range   Glucose-Capillary 176 (H) 65 - 99 mg/dL  Glucose, capillary     Status: Abnormal   Collection Time: 11/26/15 10:08 PM  Result Value Ref Range   Glucose-Capillary 112 (H) 65 - 99 mg/dL   Comment 1 Notify RN    Comment 2 Document in Chart   Lactic acid, plasma     Status: None   Collection Time: 11/27/15  5:30 AM  Result Value Ref Range   Lactic Acid, Venous 1.4 0.5 - 2.0 mmol/L  CBC     Status: Abnormal   Collection Time: 11/27/15  6:15 AM  Result Value Ref Range   WBC 9.5 4.0 - 10.5 K/uL   RBC 4.20 (L) 4.22 - 5.81 MIL/uL    Hemoglobin 12.3 (L) 13.0 - 17.0 g/dL   HCT 37.4 (L) 39.0 - 52.0 %   MCV 89.0 78.0 - 100.0 fL   MCH 29.3 26.0 - 34.0 pg   MCHC 32.9 30.0 - 36.0 g/dL   RDW 13.1 11.5 - 15.5 %   Platelets 177 150 - 400 K/uL  Basic metabolic panel     Status: Abnormal   Collection Time: 11/27/15  6:15 AM  Result Value Ref Range   Sodium 139 135 - 145 mmol/L   Potassium 3.2 (L) 3.5 - 5.1 mmol/L   Chloride 103 101 - 111 mmol/L   CO2 24 22 - 32 mmol/L   Glucose, Bld 109 (H) 65 - 99 mg/dL   BUN 14 6 - 20 mg/dL   Creatinine, Ser 1.41 (H) 0.61 - 1.24 mg/dL   Calcium 8.3 (L) 8.9 - 10.3 mg/dL   GFR calc non Af Amer 46 (L) >60 mL/min   GFR calc Af Amer 54 (L) >60 mL/min    Comment: (NOTE) The eGFR has been calculated using the CKD EPI equation. This calculation has not been validated in all clinical situations. eGFR's persistently <60 mL/min signify possible Chronic Kidney Disease.    Anion gap 12 5 - 15  Glucose, capillary     Status: Abnormal   Collection Time: 11/27/15  6:47 AM  Result Value Ref Range   Glucose-Capillary 107 (H) 65 - 99 mg/dL   Comment 1 Notify  RN    Comment 2 Document in Chart   Glucose, capillary     Status: Abnormal   Collection Time: 11/27/15 11:12 AM  Result Value Ref Range   Glucose-Capillary 124 (H) 65 - 99 mg/dL   Comment 1 Notify RN    Comment 2 Document in Chart   Glucose, capillary     Status: Abnormal   Collection Time: 11/27/15  4:31 PM  Result Value Ref Range   Glucose-Capillary 116 (H) 65 - 99 mg/dL  Glucose, capillary     Status: Abnormal   Collection Time: 11/28/15  6:35 AM  Result Value Ref Range   Glucose-Capillary 106 (H) 65 - 99 mg/dL   Comment 1 Notify RN    Comment 2 Document in Chart   Glucose, capillary     Status: None   Collection Time: 11/28/15 11:29 AM  Result Value Ref Range   Glucose-Capillary 99 65 - 99 mg/dL   Comment 1 Notify RN    Comment 2 Document in Chart    No results found.     Medical Problem List and Plan: 1.  Balance  deficits and poor cognition secondary to left ICH. 2.  DVT Prophylaxis/Anticoagulation: Mechanical: Sequential compression devices, below knee Bilateral lower extremities 3. Pain Management: Tylenol prn for pain 4. Mood: LCSW to follow for evaluation and support.  5. Neuropsych: This patient is not fully capable of making decisions on his own behalf. 6. Skin/Wound Care: routine pressure relief measures. 7. Fluids/Electrolytes/Nutrition: Monitor I/O. Check lytes in am. 8. H/o BPH/Urinary retention: Continue foley for now. Check UA/UCS. Change flomax to rapaflo. .  9. Depression/PTSD/anxiety:  On Zoloft daily with valium prn.  10. COPD:  Uses albuterol daily due to "tightness' Also has issues with congestion.  Currently perseverating on respiratory symptoms --will check CXR. Schedule nebs for now.  11. Headaches: Gets one "every afternoon".  Continue Fioricet.  Will add Claritin and Flonase to help with nasal/sinus congestion.  12. Sundowning: wife reporting intermittent confusion worse in the evenings.  13. Prediabetes: Periodically monitor 14. AKI: continue to monitor. Avoid nephrotoxic meds 15: Hypokalemia: Will continue to monitor   Post Admission Physician Evaluation: 1. Functional deficits secondary  to left ICH . 2. Patient is admitted to receive collaborative, interdisciplinary care between the physiatrist, rehab nursing staff, and therapy team. 3. Patient's level of medical complexity and substantial therapy needs in context of that medical necessity cannot be provided at a lesser intensity of care such as a SNF. 4. Patient has experienced substantial functional loss from his/her baseline which was documented above under the "Functional History" and "Functional Status" headings.  Judging by the patient's diagnosis, physical exam, and functional history, the patient has potential for functional progress which will result in measurable gains while on inpatient rehab.  These gains will be of  substantial and practical use upon discharge  in facilitating mobility and self-care at the household level. 5. Physiatrist will provide 24 hour management of medical needs as well as oversight of the therapy plan/treatment and provide guidance as appropriate regarding the interaction of the two. 6. 24 hour rehab nursing will assist with safety, disease management and patient education and help integrate therapy concepts, techniques,education, etc. 7. PT will assess and treat for/with: Lower extremity strength, range of motion, stamina, balance, functional mobility, safety, adaptive techniques and equipment, coping skills, pain control, stroke education.   Goals are: Mod I. 8. OT will assess and treat for/with: ADL's, functional mobility, safety, upper extremity strength, adaptive  techniques and equipment, ego support, and community reintegration.   Goals are: Mod I. Therapy may proceed with showering this patient. 9. SLP will assess and treat for/with: speech, language, higher level cognition.  Goals are: Mod I/Supervision. 10. Case Management and Social Worker will assess and treat for psychological issues and discharge planning. 11. Team conference will be held weekly to assess progress toward goals and to determine barriers to discharge. 12. Patient will receive at least 3 hours of therapy per day at least 5 days per week. 13. ELOS: 8-11 days.       14. Prognosis:  good  Delice Lesch, MD 11/28/2015

## 2015-11-28 NOTE — PMR Pre-admission (Signed)
PMR Admission Coordinator Pre-Admission Assessment  Patient: David Duke is an 81 y.o., male MRN: [id number] DOB: [birth year] Height: 5\' 7"  (170.2 cm) Weight: 64.1 kg (141 lb 5 oz)              Insurance Information HMO: yes    PPO:      PCP:      IPA:      80/20:      OTHER: Medicare replacement PRIMARY: [employer] Medicare      Policy#: [id number]      Subscriber: pt CM Name: Gweneth Dimitri      Phone#: [phone]     Fax#: online portal Pre-Cert#: [id number]      Employer: retired f/u with [employer] phone [phone] fax emr access Benefits:  Phone #: [phone]     Name: 11/27/15 Eff. Date: 09/09/15     Deduct: none      Out of Pocket Max: $4900      Life Max: none CIR: $345 per day days 1-5 then covers 100%      SNF: no copay days 1-20; $160 copay days 21-51 no copay days 52-100 Outpatient: $40 copay per visit     Co-Pay: no visit limit Home Health: 100%      Co-Pay: no visit limit DME: 80%     Co-Pay: 20% Providers: in network  SECONDARY: none       Medicaid Application Date:       Case Manager:  Disability Application Date:       Case Worker:   Emergency Contact Information Contact Information    Name Relation Home Work Mobile   [place] Spouse [phone]     Nathyn, Luiz   [phone]     Current Medical History  Patient Admitting Diagnosis: left ICH  History of Present Illness:David Duke is a 81 y.o.right handed male with history of macular degeneration and subsequent blindness right eye,Severe anxiety, COPD/tobacco abuse.  Presented 11/22/2015 with blurred vision left eye as well as altered mental status reported by his wife. Blood pressure 145/98. Cranial CT scan showed acute intraparenchymal hemorrhage within the left lower parietal occipital lobe measuring 5.6 x 2.9 cm with surrounding edema causing slight mass effect without midline shift. Echocardiogram with ejection fraction of 70% grade 1 diastolic dysfunction. EEG nonspecific  finding indicating of mild diffuse cerebral dysfunction no seizure activity. Carotid Dopplers with no ICA stenosis. Patient did not receive TPA due to intracranial hemorrhage. Neurology consulted With follow-up cranial CT scan 11/26/2015 showing expected evolutionary changes with some contraction of the hyperdense clot in the left posterior temporal occipital region. No change in mass effect.. Attempts had been made for MRI but patient not able to tolerate even when dosed with diazepam. Close monitoring of blood pressure. Maintain on a regular diet.  Total: 4 NIH    Past Medical History  Past Medical History  Diagnosis Date  . High cholesterol   . Depression   . Anxiety   . Macular degeneration of both eyes   . Blind right eye   . COPD (chronic obstructive pulmonary disease) (HCC)   . Shortness of breath dyspnea     Family History  family history includes Cerebral aneurysm in his father; Colon cancer in his mother.  Prior Rehab/Hospitalizations:  Has the patient had major surgery during 100 days prior to admission? No  Current Medications   Current facility-administered medications:  .   stroke: mapping our early stages of recovery book, , Does not  apply, Once, Ram Daniel Nones, MD .  acetaminophen (TYLENOL) tablet 650 mg, 650 mg, Oral, Q6H PRN, 650 mg at 11/26/15 1402 **OR** [DISCONTINUED] acetaminophen (TYLENOL) suppository 650 mg, 650 mg, Rectal, Q4H PRN, Ram Daniel Nones, MD .  albuterol (PROVENTIL) (2.5 MG/3ML) 0.083% nebulizer solution 2.5 mg, 2.5 mg, Nebulization, BID, Micki Riley, MD, 2.5 mg at 11/28/15 0936 .  albuterol (PROVENTIL) (2.5 MG/3ML) 0.083% nebulizer solution 3 mL, 3 mL, Inhalation, Q6H PRN, Kinnie Scales Rinehuls, PA-C, 3 mL at 11/27/15 1838 .  alum & mag hydroxide-simeth (MAALOX/MYLANTA) 200-200-20 MG/5ML suspension 30 mL, 30 mL, Oral, Q6H PRN, Micki Riley, MD, 30 mL at 11/28/15 1049 .  atorvastatin (LIPITOR) tablet 20 mg, 20 mg, Oral, q1800,  Marvel Plan, MD, 20 mg at 11/27/15 1638 .  butalbital-acetaminophen-caffeine (FIORICET, ESGIC) 50-325-40 MG per tablet 1 tablet, 1 tablet, Oral, Q8H PRN, Kinnie Scales Rinehuls, PA-C, 1 tablet at 11/28/15 0322 .  calcium-vitamin D (OSCAL WITH D) 500-200 MG-UNIT per tablet 1 tablet, 1 tablet, Oral, Q breakfast, Micki Riley, MD, 1 tablet at 11/28/15 0936 .  diazepam (VALIUM) tablet 5 mg, 5 mg, Oral, Q12H, Calvert Cantor, MD, 5 mg at 11/28/15 0935 .  ondansetron (ZOFRAN) injection 4 mg, 4 mg, Intravenous, Q8H PRN, Octavion L Rinehuls, PA-C, 4 mg at 11/23/15 1035 .  pantoprazole (PROTONIX) EC tablet 40 mg, 40 mg, Oral, QHS, Faye Ramsay Rumbarger, RPH, 40 mg at 11/27/15 2327 .  senna-docusate (Senokot-S) tablet 1 tablet, 1 tablet, Oral, BID, Ram Daniel Nones, MD, 1 tablet at 11/28/15 [phone] .  sertraline (ZOLOFT) tablet 100 mg, 100 mg, Oral, Daily, Stonewall L Rinehuls, PA-C, 100 mg at 11/28/15 0936 .  tamsulosin (FLOMAX) capsule 0.4 mg, 0.4 mg, Oral, Daily, Zaahir L Rinehuls, PA-C, 0.4 mg at 11/28/15 0935  Patients Current Diet: Diet regular Room service appropriate?: Yes; Fluid consistency:: Thin  Precautions / Restrictions Precautions Precautions: Fall Precaution Comments: check BP Restrictions Weight Bearing Restrictions: No   Has the patient had 2 or more falls or a fall with injury in the past year?No  Prior Activity Level Limited Community (1-2x/wk): Drive and Independent pta. 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  Independent  Indoor Mobility: Did the patient need assistance with walking from room to room (with or without device)? Independent  Stairs: Did the patient need assistance with internal or external stairs (with or without device)? Independent  Functional Cognition: Did the patient need help planning regular tasks such as shopping or remembering to take medications? Independent  Current Functional Level Cognition  Arousal/Alertness: Awake/alert Overall Cognitive Status: Impaired/Different from baseline Current Attention Level: Sustained Orientation Level: Oriented X4 Following Commands: Follows one step commands consistently Safety/Judgement: Decreased awareness of safety, Decreased awareness of deficits General Comments: Pt is histrionic and tangential.  He self distracts frequently - requires max cues to stay on task  Attention: Focused, Sustained, Selective Focused Attention: Appears intact Sustained Attention: Appears intact Selective Attention: Impaired Selective Attention Impairment: Verbal basic Memory: Impaired  Memory Impairment: Decreased recall of new information Awareness: Appears intact Executive Function: Sequencing, Organizing Sequencing: Impaired Sequencing Impairment: Verbal basic Organizing: Impaired Organizing Impairment: Verbal basic Behaviors: Other (comment) (tangential)    Extremity Assessment (includes Sensation/Coordination)  Upper Extremity Assessment: Overall WFL for tasks assessed  Lower Extremity Assessment: Defer to PT evaluation RLE Deficits / Details: not formally tested but pt able to stand EOB w/ 1 person HHA and able to perform SLR LLE Deficits / Details: not formally tested but pt able to stand EOB w/ 1 person HHA and able to perform SLR    ADLs  Overall ADL's : Needs assistance/impaired Eating/Feeding: Modified independent, Sitting Grooming:  Wash/dry hands, Wash/dry face, Oral care, Brushing hair, Minimal assistance, Moderate assistance, Standing Upper Body Bathing: Minimal assitance, Sitting Lower Body Bathing: Moderate assistance, Sit to/from stand Upper Body Dressing : Minimal assistance, Sitting Lower Body Dressing: Moderate assistance, Sit to/from stand Toilet Transfer: Minimal assistance, Stand-pivot, BSC Toileting- Clothing Manipulation and Hygiene: Minimal assistance, Sit to/from stand Functional mobility during ADLs: Minimal assistance General ADL Comments: Pt requires mod A due to being highly distractable and difficulty staying on task     Mobility  Overal bed mobility: Needs Assistance, +2 for physical assistance Bed Mobility: Supine to Sit, Sit to Supine Supine to sit: Min guard Sit to supine: Min guard General bed mobility comments: Simple commands to have pt sit EOB.  Requires assist to elevate trunk and, when returning to supine, assist managing LEs    Transfers  Overall transfer level: Needs assistance Equipment used: 1 person hand held assist Transfers: Sit to/from Stand, Stand Pivot Transfers Sit to Stand: Min assist Stand pivot transfers: Min assist General transfer comment: assist for balance    Ambulation / Gait / Stairs / Wheelchair Mobility  Ambulation/Gait General Gait Details: no able to assess    Posture / Balance Balance Overall balance assessment: Needs assistance Sitting-balance support: No upper extremity supported Sitting balance-Leahy Scale: Good Standing balance support: During functional activity Standing balance-Leahy Scale: Poor Standing balance comment: Relies on UE support    Special needs/care consideration                             Bowel mgmt: LBM 3/21 continent Bladder mgmt: foley   Previous Home Environment Living Arrangements: Spouse/significant other  Lives With: Spouse Available Help at Discharge: Family, Available 24 hours/day Type of Home: House Home Layout:  One level Home Access: Stairs to enter Entrance Stairs-Rails: Right, Left, Can reach both Entrance Stairs-Number of Steps: 4 steps front entry, 8 steps steps back entry. Both with rails  Bathroom Shower/Tub: Tub/shower unit, Engineer, building servicesCurtain Bathroom Toilet: Standard Bathroom Accessibility: Yes How Accessible: Accessible via walker Home Care Services: No Additional Comments: wife states pt confused, easily irritated, can be demanding by nature  Discharge Living Setting Plans for Discharge Living Setting: Patient's home, Lives with (comment) (wife) Type of Home at Discharge: House Discharge Home Layout: One level Discharge Home Access: Stairs to enter Entrance Stairs-Rails: Right, Left, Can reach both Entrance Stairs-Number of Steps: 4 steps front entry and 8 steps back entry, both with rails Discharge Bathroom Shower/Tub: Tub/shower unit, Curtain Discharge Bathroom Toilet: Standard Discharge Bathroom Accessibility: Yes How Accessible: Accessible via walker Does the patient have any problems obtaining your medications?: No  Social/Family/Support Systems Patient Roles: Spouse, Parent Contact Information: Martie LeeSabrina, wife Anticipated Caregiver: wife Anticipated Industrial/product designerCaregiver's Contact Information: see above Ability/Limitations of Caregiver: wife unemployed, 81 yo. Does not drive  due to epilepsy Caregiver Availability: 24/7 Discharge Plan Discussed with Primary Caregiver: Yes Is Caregiver In Agreement with Plan?: Yes Does Caregiver/Family have Issues with Lodging/Transportation while Pt is in Rehab?: No (sife stays with pt nearly 24/7. Has to get someone to drive )  Wife is 78 yo and they have been married 26 years. She does not drive due to driving restrictions with her epilepsy.  Goals/Additional Needs Patient/Family Goal for Rehab: supervision with PT, OT, and SLP Expected length of stay: ELOS 10- 14 days Equipment Needs: Bed and chair alarm Pt/Family Agrees to Admission and willing to  participate: Yes Program Orientation Provided & Reviewed with Pt/Caregiver Including Roles  & Responsibilities: Yes  Decrease burden of Care through IP rehab admission: n/a  Possible need for SNF placement upon discharge: not anticipated  Patient Condition: This patient's condition remains as documented in the consult dated 11/27/2015, in which the Rehabilitation Physician determined and documented that the patient's condition is appropriate for intensive rehabilitative care in an inpatient rehabilitation facility. Will admit to inpatient rehab today.  Preadmission Screen Completed By:  Clois Dupes, 11/28/2015 12:01 PM ______________________________________________________________________   Discussed status with Dr. Allena Katz on 11/28/2015 at  1201 and received telephone approval for admission today.  Admission Coordinator:  Clois Dupes, time 1610 Date 11/28/2015

## 2015-11-28 NOTE — Progress Notes (Signed)
Patient arrived to [address] from [address]. Patient given admission packet, and oriented to his room. On assessment noted a back rash-made MD aware. Patient is HOH and per report blind in (R) eye. Will continue to monitor patient. David Duke

## 2015-11-28 NOTE — Progress Notes (Signed)
Physical Therapy Treatment Patient Details Name: David Duke MRN: [id number] DOB: [birth year] Today's Date: 11/28/2015    History of Present Illness Pt is a 81 y/o M w/ confusion and Lt parietal-occipital intraparenchymal hemorrhage. Pt's PMH includes depression, anxiety, macular degeneration Bil eyes, blind Rt eye, COPD.    PT Comments    Patient with improved BP both with sitting EOB and with ambulation in 130's/70's range.  Feel continued skilled PT and multidisciplinary care in the inpatient rehab setting indicated to maximize independence and safety prior to d/c home.  Follow Up Recommendations  CIR;Supervision/Assistance - 24 hour     Equipment Recommendations  Other (comment) (TBA)    Recommendations for Other Services       Precautions / Restrictions Precautions Precautions: Fall Precaution Comments: check BP    Mobility  Bed Mobility         Supine to sit: Min guard Sit to supine: Supervision   General bed mobility comments: cues for positioning in bed  Transfers Overall transfer level: Needs assistance Equipment used: 1 person hand held assist Transfers: Sit to/from Stand Sit to Stand: Min assist            Ambulation/Gait Ambulation/Gait assistance: Mod assist Ambulation Distance (Feet): 140 Feet Assistive device: 1 person hand held assist Gait Pattern/deviations: Step-through pattern;Decreased stride length;Ataxic;Narrow base of support;Scissoring     General Gait Details: needs support for balance, safety   Stairs            Wheelchair Mobility    Modified Rankin (Stroke Patients Only)       Balance Overall balance assessment: Needs assistance         Standing balance support: Single extremity supported Standing balance-Leahy Scale: Poor Standing balance comment: UE support for balance                    Cognition Arousal/Alertness: Awake/alert Behavior During Therapy: WFL for tasks  assessed/performed Overall Cognitive Status: Impaired/Different from baseline     Current Attention Level: Selective   Following Commands: Follows multi-step commands with increased time Safety/Judgement: Decreased awareness of deficits     General Comments: Pt is histrionic and tangential.  He self distracts frequently - requires max cues to stay on task     Exercises      General Comments General comments (skin integrity, edema, etc.): tangential conversation about multiple historical events; wife in the room and tearful at times; admits taking medication for her Epilepsy      Pertinent Vitals/Pain Faces Pain Scale: Hurts little more Pain Location: headache Pain Descriptors / Indicators: Headache Pain Intervention(s): Monitored during session    Home Living                      Prior Function            PT Goals (current goals can now be found in the care plan section) Progress towards PT goals: Progressing toward goals    Frequency  Min 3X/week    PT Plan Current plan remains appropriate    Co-evaluation             End of Session Equipment Utilized During Treatment: Gait belt Activity Tolerance: Patient limited by pain Patient left: in bed;with call bell/phone within reach;with family/visitor present     Time: 1430-1455 PT Time Calculation (min) (ACUTE ONLY): 25 min  Charges:  $Gait Training: 23-37 mins  G CodesElray Mcgregor:      Ladamien Rammel 11/28/2015, 5:12 PM  Sheran Lawlessyndi Hadja [place], [place] CarolinaPT [phone] 11/28/2015

## 2015-11-28 NOTE — Clinical Social Work Note (Signed)
CSW received referral for SNF.  Case discussed with case manager, and plan is to discharge to inpatient rehab.  CSW to sign off please re-consult if social work needs arise.  Tiffane Sheldon R. Leeandra Ellerson, MSW, LCSWA [phone]  

## 2015-11-28 NOTE — Progress Notes (Signed)
I have insurance approval and a bed to admit pt to inpt rehab today. I met with pt and his wife at bedside and they are in agreement. I have contacted Stroke service and will make the arrangments to admit to today. RN CM and SW are aware. [phone]

## 2015-11-29 ENCOUNTER — Inpatient Hospital Stay (HOSPITAL_COMMUNITY): Payer: Medicare Other | Admitting: Physical Therapy

## 2015-11-29 ENCOUNTER — Inpatient Hospital Stay (HOSPITAL_COMMUNITY): Payer: Medicare Other | Admitting: Speech Pathology

## 2015-11-29 ENCOUNTER — Inpatient Hospital Stay (HOSPITAL_COMMUNITY): Payer: Medicare Other

## 2015-11-29 LAB — CULTURE, BLOOD (ROUTINE X 2)
CULTURE: NO GROWTH
Culture: NO GROWTH

## 2015-11-29 LAB — CBC WITH DIFFERENTIAL/PLATELET
Basophils Absolute: 0 10*3/uL (ref 0.0–0.1)
Basophils Relative: 0 %
EOS PCT: 2 %
Eosinophils Absolute: 0.2 10*3/uL (ref 0.0–0.7)
HEMATOCRIT: 35.9 % — AB (ref 39.0–52.0)
Hemoglobin: 12.2 g/dL — ABNORMAL LOW (ref 13.0–17.0)
LYMPHS ABS: 1.5 10*3/uL (ref 0.7–4.0)
LYMPHS PCT: 14 %
MCH: 30.3 pg (ref 26.0–34.0)
MCHC: 34 g/dL (ref 30.0–36.0)
MCV: 89.3 fL (ref 78.0–100.0)
Monocytes Absolute: 0.8 10*3/uL (ref 0.1–1.0)
Monocytes Relative: 8 %
Neutro Abs: 7.7 10*3/uL (ref 1.7–7.7)
Neutrophils Relative %: 76 %
PLATELETS: 214 10*3/uL (ref 150–400)
RBC: 4.02 MIL/uL — AB (ref 4.22–5.81)
RDW: 13.2 % (ref 11.5–15.5)
WBC: 10.2 10*3/uL (ref 4.0–10.5)

## 2015-11-29 LAB — COMPREHENSIVE METABOLIC PANEL
ALBUMIN: 2.7 g/dL — AB (ref 3.5–5.0)
ALT: 20 U/L (ref 17–63)
AST: 24 U/L (ref 15–41)
Alkaline Phosphatase: 59 U/L (ref 38–126)
Anion gap: 9 (ref 5–15)
BUN: 10 mg/dL (ref 6–20)
CHLORIDE: 106 mmol/L (ref 101–111)
CO2: 26 mmol/L (ref 22–32)
Calcium: 8.7 mg/dL — ABNORMAL LOW (ref 8.9–10.3)
Creatinine, Ser: 1.32 mg/dL — ABNORMAL HIGH (ref 0.61–1.24)
GFR calc Af Amer: 58 mL/min — ABNORMAL LOW (ref >60)
GFR calc non Af Amer: 50 mL/min — ABNORMAL LOW (ref >60)
GLUCOSE: 108 mg/dL — AB (ref 65–99)
POTASSIUM: 3.5 mmol/L (ref 3.5–5.1)
Sodium: 141 mmol/L (ref 135–145)
Total Bilirubin: 0.6 mg/dL (ref 0.3–1.2)
Total Protein: 5.9 g/dL — ABNORMAL LOW (ref 6.5–8.1)

## 2015-11-29 MED ORDER — SILODOSIN 4 MG PO CAPS
4.0000 mg | ORAL_CAPSULE | Freq: Every day | ORAL | Status: DC
  Administered 2015-11-29 – 2015-12-10 (×12): 4 mg via ORAL
  Filled 2015-11-29 (×12): qty 1

## 2015-11-29 MED ORDER — PNEUMOCOCCAL VAC POLYVALENT 25 MCG/0.5ML IJ INJ: 0.5000 mL | INJECTION | INTRAMUSCULAR | Status: DC | PRN

## 2015-11-29 MED ORDER — TRAMADOL-ACETAMINOPHEN 37.5-325 MG PO TABS
1.0000 | ORAL_TABLET | ORAL | Status: DC | PRN
  Administered 2015-11-29 – 2015-12-10 (×20): 1 via ORAL
  Filled 2015-11-29 (×20): qty 1

## 2015-11-29 MED ORDER — ALBUTEROL SULFATE (2.5 MG/3ML) 0.083% IN NEBU: 2.5000 mg | INHALATION_SOLUTION | RESPIRATORY_TRACT | Status: DC | PRN

## 2015-11-29 MED ORDER — MENTHOL 3 MG MT LOZG
1.0000 | LOZENGE | OROMUCOSAL | Status: DC | PRN
  Administered 2015-11-29 – 2015-12-01 (×2): 3 mg via ORAL
  Filled 2015-11-29: qty 9

## 2015-11-29 MED ORDER — TOPIRAMATE 25 MG PO TABS
25.0000 mg | ORAL_TABLET | Freq: Two times a day (BID) | ORAL | Status: DC
  Administered 2015-11-29 – 2015-12-03 (×9): 25 mg via ORAL
  Filled 2015-11-29 (×9): qty 1

## 2015-11-29 MED ORDER — INFLUENZA VAC SPLIT QUAD 0.5 ML IM SUSY: 0.5000 mL | PREFILLED_SYRINGE | INTRAMUSCULAR | Status: DC | PRN

## 2015-11-29 MED ORDER — NYSTATIN 100000 UNIT/GM EX POWD
Freq: Three times a day (TID) | CUTANEOUS | Status: DC
  Administered 2015-11-29 – 2015-12-01 (×5): via TOPICAL
  Administered 2015-12-02: 1 g via TOPICAL
  Administered 2015-12-02 – 2015-12-10 (×11): via TOPICAL
  Filled 2015-11-29 (×2): qty 15

## 2015-11-29 NOTE — IPOC Note (Addendum)
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   Gait disturbance, post-stroke   Ataxia, late effect of cerebrovascular disease   BPH (benign prostatic hyperplasia)   Urinary retention   Adjustment disorder with mixed anxiety and depressed mood   Chronic obstructive pulmonary disease (HCC)   Cephalalgia   AKI (acute kidney injury) (HCC)   Acute frontal sinusitis     Functional Problem List: Nursing Bladder, Bowel, Medication Management, Safety, Skin Integrity  PT Balance, Behavior, Pain, Safety  OT Balance, Cognition, Vision, Safety, Endurance  SLP Cognition, Linguistic  TR         Basic ADL's: OT Eating, Grooming, Bathing, Dressing, Toileting     Advanced  ADL's: OT       Transfers: PT Bed to Chair, Car, Floor, Occupational psychologisturniture  OT Toilet, Tub/Shower     Locomotion: PT Ambulation, Stairs     Additional Impairments: OT None  SLP Communication, Social Cognition comprehension, expression Social Interaction, Problem Solving, Memory, Attention, Awareness  TR      Anticipated Outcomes Item Anticipated Outcome  Self Feeding Mod I  Swallowing      Basic self-care  Supervision  Toileting  Mod I    Bathroom Transfers Supervision  Bowel/Bladder  LBM 3/21 and continent with minimal assistance  Transfers  Mod I with LRAD  Locomotion  Mod I with LRAD  Communication  mod I for functional communication  Cognition  supervision for basic  Pain  <3  Safety/Judgment  supervision   Therapy Plan: PT Intensity: Minimum of 1-2 x/day ,45 to 90 minutes PT Frequency: 5 out of 7 days PT Duration Estimated Length of Stay: 7-10 days  OT Intensity: Minimum of 1-2 x/day, 45 to 90 minutes OT Frequency: 5 out of 7 days OT Duration/Estimated Length of Stay: 7-10  days SLP Intensity: Minumum of 1-2 x/day, 30 to 90 minutes SLP Frequency: 3 to 5 out of 7 days SLP Duration/Estimated Length of Stay: 10-12 days       Team Interventions: Nursing Interventions Bladder Management, Bowel Management, Disease Management/Prevention, Pain Management, Medication Management, Skin Care/Wound Management, Patient/Family Education  PT interventions Ambulation/gait training, Warden/rangerBalance/vestibular training, Cognitive remediation/compensation, Community reintegration, Discharge planning, DME/adaptive equipment instruction, Functional mobility training, Neuromuscular re-education, Pain management, Patient/family education, Psychosocial support, Stair training, Therapeutic Activities, Therapeutic Exercise, UE/LE Strength taining/ROM, UE/LE Coordination activities, Visual/perceptual remediation/compensation, Wheelchair propulsion/positioning  OT Interventions Warden/rangerBalance/vestibular training, Patient/family education, Self Care/advanced ADL retraining, Therapeutic Activities, UE/LE Coordination activities, Therapeutic Exercise, UE/LE Strength taining/ROM, Functional mobility training, Discharge planning, Cognitive remediation/compensation, DME/adaptive equipment instruction  SLP Interventions Cognitive remediation/compensation, Cueing hierarchy, Functional tasks, Speech/Language facilitation, Therapeutic Activities, Medication managment, Patient/family education  TR Interventions    SW/CM Interventions Discharge Planning, Psychosocial Support, Patient/Family Education    Team Discharge Planning: Destination: PT-  ,OT- Home , SLP-Home Projected Follow-up: PT-Home health PT, Outpatient PT, OT-  Home health OT, SLP-Outpatient SLP Projected Equipment Needs: PT-Rolling walker with 5" wheels, OT- To be determined, SLP-None recommended by SLP Equipment Details: PT- , OT-Retains access wife's w/c, crutches and shower chair from a prior LE injury, if needed. Patient/family involved in discharge  planning: PT- Patient, Family member/caregiver,  OT-Patient, SLP-Patient, Family member/caregiver  MD ELOS: 8-12 days. Medical Rehab Prognosis:  Good Assessment: 81 y.o. ambidextrous male with history of macular degeneration with blindness right eye, anxiety disorder,  COPD/tobacco abuse. He was admitted from Dr. Ephriam Knuckles office with blurred vision left eye as well as confusion. Cranial CT scan showed acute intraparenchymal hemorrhage within the left lower parietal occipital lobe measuring 5.6 x 2.9 cm with surrounding edema causing slight mass effect without midline shift. Echocardiogram showed EF f 70% grade 1 diastolic dysfunction. EEG nonspecific finding indicating of mild diffuse cerebral dysfunction no seizure activity. Carotid Dopplers was negative for ICA stenosis. Patient did not receive TPA due to intracranial hemorrhage. Follow-up cranial CT scan 11/26/2015 showing expected evolutionary changes with some contraction of the hyperdense clot in the left posterior temporal occipital region. No change in mass effect.. Patient has been unable to tolerate attempts at MRI due to claustrophobia. Agitation has resolved but he continues to have bouts of confusion. Neurology questioned CAA v/s hypertensive bleed. He has had issues with worsening of HA as well as urinary retention requiring foley. Had febrile episode with leucocytosis but no signs of infection on work up. Therapy ongoing and patient with significant cognitive deficits, balance deficits as well as generalize weakness affecting mobility.   See Team Conference Notes for weekly updates to the plan of care

## 2015-11-29 NOTE — Progress Notes (Signed)
[place] PHYSICAL MEDICINE & REHABILITATION     PROGRESS NOTE  Subjective/Complaints:  Patient sleeping in bed this morning. She requires some time to reorient himself to the best of his abilities.  ROS: + Stomach upset. Denies CP, SOB, N/V/D  Objective: Vital Signs: Blood pressure 120/58, pulse 74, temperature 98.7 F (37.1 C), temperature source Oral, resp. rate 18, height  (1.727 m), weight 63.9 kg (140 lb 14 oz), SpO2 98 %. Dg Chest 2 View  11/28/2015  CLINICAL DATA:  Chest tightness.  COPD. EXAM: CHEST  2 VIEW COMPARISON:  11/25/2015. FINDINGS: AP semi-erect chest demonstrates normal heart size with clear lung fields. No bony abnormality. Similar appearance to priors. IMPRESSION: No edema or consolidation. Electronically Signed   By: Elsie Stain M.D.   On: 11/28/2015 16:43    Recent Labs  11/27/15 0615 11/29/15 0441  WBC 9.5 10.2  HGB 12.3* 12.2*  HCT 37.4* 35.9*  PLT 177 214    Recent Labs  11/27/15 0615 11/29/15 0441  NA 139 141  K 3.2* 3.5  CL 103 106  GLUCOSE 109* 108*  BUN 14 10  CREATININE 1.41* 1.32*  CALCIUM 8.3* 8.7*   CBG (last 3)   Recent Labs  11/28/15 0635 11/28/15 1129 11/28/15 1645  GLUCAP 106* 99 89    Wt Readings from Last 3 Encounters:  11/28/15 63.9 kg (140 lb 14 oz)  11/22/15 64.1 kg (141 lb 5 oz)  03/31/15 67.586 kg (149 lb)    Physical Exam:  BP 120/58 mmHg  Pulse 74  Temp(Src) 98.7 F (37.1 C) (Oral)  Resp 18  Ht  (1.727 m)  Wt 63.9 kg (140 lb 14 oz)  BMI 21.42 kg/m2  SpO2 98% Constitutional: He appears well-developed and well-nourished. NAD.  HENT: Normocephalic and atraumatic.  Eyes: Conjunctivae and EOM are normal.  Cardiovascular: Normal rate and regular rhythm.No murmur heard. Respiratory: Effort normal. No respiratory distress.  GI: Soft. Bowel sounds are normal. He exhibits no distension. There is no tenderness.  Musculoskeletal: He exhibits no edema or tenderness.  Neurological: He is alert.   Has poor safety awareness.  Mild ataxia with BUE.  A&Ox2 Motor: B/l UE: 5/5 proximal to distal B/l LE 5/5 proximal to distal  Skin: Skin is warm and dry. No rash noted. No erythema.  Psychiatric: His speech is tangential. Cognition and memory are impaired.  Behaviors slowed at times.   Assessment/Plan: 1. 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 Physiatrist is providing close team supervision and 24 hour management of active medical problems listed below. Physiatrist and rehab team continue to assess barriers to discharge/monitor patient progress toward functional and medical goals.  Function:  Bathing Bathing position      Bathing parts      Bathing assist        Upper Body Dressing/Undressing Upper body dressing                    Upper body assist        Lower Body Dressing/Undressing Lower body dressing                                  Lower body assist        Toileting Toileting          Toileting assist     Transfers  Chair/bed Company secretarytransfer             Locomotion Ambulation           Wheelchair          Cognition Comprehension Comprehension assist level: Understands basic 75 - 89% of the time/ requires cueing 10 - 24% of the time  Expression Expression assist level: Expresses basic 75 - 89% of the time/requires cueing 10 - 24% of the time. Needs helper to occlude trach/needs to repeat words.  Social Interaction Social Interaction assist level: Interacts appropriately 75 - 89% of the time - Needs redirection for appropriate language or to initiate interaction.  Problem Solving Problem solving assist level: Solves basic 75 - 89% of the time/requires cueing 10 - 24% of the time  Memory Memory assist level: Recognizes or recalls 50 - 74% of the time/requires cueing 25 - 49% of the time    Medical Problem List and Plan: 1.  Balance deficits and poor cognition secondary to left ICH.  Begin CIR  2. DVT Prophylaxis/Anticoagulation: Mechanical: Sequential compression devices, below knee Bilateral lower extremities 3. Pain Management: Tylenol prn for pain 4. Mood: LCSW to follow for evaluation and support.  5. Neuropsych: This patient is not fully capable of making decisions on his own behalf. 6. Skin/Wound Care: routine pressure relief measures. 7. Fluids/Electrolytes/Nutrition: Monitor I/O.   BMP improving on 3/23 8. H/o BPH/Urinary retention:    Will DC Foley today  UA with rare bacteria, urine culture pending on 3/23  Changed flomax to rapaflo adjusted for kidney function.  9. Depression/PTSD/anxiety: On Zoloft daily with valium prn.  10. COPD: Uses albuterol daily due to "tightness' Also has issues with congestion.  Chest x-ray reviewed from 3/22, relatively unremarkable  Will change schedule nebs to when necessary on 3/23.  11. Headaches: Gets one "every afternoon".   Fioricet changed to Topamax 25 mg on 3/23.   Added Claritin and Flonase to help with nasal/sinus congestion.  12. Sundowning: wife reporting intermittent confusion worse in the evenings.   Continue to reorient and adjust sleep-wake cycle.  13. Prediabetes: Periodically monitor 14. AKI: continue to monitor. Avoid nephrotoxic meds  Creatinine 1.32 on 3/23 (improving)  15: Hypokalemia: Will continue to monitor   3.5 on 3/23  Will continue to monitor   LOS (Days) 1 A FACE TO FACE EVALUATION WAS PERFORMED  Ankit Karis Jubanil Patel 11/29/2015 9:14 AM

## 2015-11-29 NOTE — Evaluation (Signed)
Physical Therapy Assessment and Plan  Patient Details  Name: David Duke MRN: [id number] Date of Birth: 1935/06/02  PT Diagnosis: Abnormality of gait, Cognitive deficits, Difficulty walking and Impaired cognition Rehab Potential: Excellent ELOS: 7-10 days    Today's Date: 11/29/2015 PT Individual Time:   1300-1402 62 min   Problem List:  Patient Active Problem List   Diagnosis Date Noted  . 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 Gait disturbance, post-stroke   . Ataxia, late effect of cerebrovascular disease   . BPH (benign prostatic hyperplasia)   . Urinary retention   . Adjustment disorder with mixed anxiety and depressed mood   . Chronic obstructive pulmonary disease ([place])   . Cephalalgia   . AKI (acute kidney injury) ([place])   . Macular degeneration   . Prediabetes   . Hypokalemia   . Absolute anemia   . Aphasia   . Acute encephalopathy 11/25/2015  . Hyponatremia 11/25/2015  . Acute kidney injury ([place]) 11/25/2015  . Depression   . Anxiety   . COPD (chronic obstructive pulmonary disease) ([place])   . ICH (intracerebral hemorrhage) ([place]) 11/22/2015    Past Medical History:  Past Medical History  Diagnosis Date  . High cholesterol   . Depression   . Anxiety   . Macular degeneration of both eyes   . Blind right eye   . COPD (chronic obstructive pulmonary disease) ([place])   . Shortness of breath dyspnea    Past Surgical History:  Past Surgical History  Procedure Laterality Date  . No past surgeries      Assessment & Plan Clinical Impression: Patient is a46 y.o. ambidextrous male with history of macular degeneration with blindness right eye, anxiety disorder, COPD/tobacco abuse. He was admitted from Dr. Dahlia Bailiff office with blurred vision left eye as well as confusion. Blood pressure 145/98. Cranial CT scan showed acute intraparenchymal hemorrhage within the left lower parietal occipital lobe measuring 5.6 x 2.9 cm with surrounding edema  causing slight mass effect without midline shift. Echocardiogram showed EF f 81% grade 1 diastolic dysfunction. EEG nonspecific finding indicating of mild diffuse cerebral dysfunction no seizure activity. Carotid Dopplers was negative for ICA stenosis. Patient did not receive TPA due to intracranial hemorrhage. Follow-up cranial CT scan 11/26/2015 showing expected evolutionary changes with some contraction of the hyperdense clot in the left posterior temporal occipital region. No change in mass effect.. Patient has been unable to tolerate attempts at MRI due to claustrophobia. Agitation has resolved but he continues to have bouts of confusion. Dr. Leonie Man questioned CAA v/s hypertensive bleed. He has had issues with worsening of HA as well as urinary retention requiring foley. Had febrile episode with leucocytosis but no signs of infection on work up.  Patient transferred to CIR on 11/28/2015 .   Patient currently requires min with mobility secondary to decreased awareness, decreased problem solving, decreased safety awareness and delayed processing and decreased standing balance and decreased balance strategies.  Prior to hospitalization, patient was independent  with mobility and lived with Spouse in a House home.  Home access is 4 steps front entry, 8 steps steps back entry. Both with rails Stairs to enter.  Patient will benefit from skilled PT intervention to maximize safe functional mobility, minimize fall risk and decrease caregiver burden for planned discharge home with intermittent assist.  Anticipate patient will benefit from follow up [facility] at discharge.  PT - End of Session Activity Tolerance: Tolerates 30+ min activity with multiple rests Endurance  Deficit: Yes PT Assessment Rehab Potential (ACUTE/IP ONLY): Excellent PT Patient demonstrates impairments in the following area(s): Balance;Behavior;Pain;Safety PT Transfers Functional Problem(s): Bed to Chair;Car;Floor;Furniture PT Locomotion  Functional Problem(s): Ambulation;Stairs PT Plan PT Intensity: Minimum of 1-2 x/day ,45 to 90 minutes PT Frequency: 5 out of 7 days PT Duration Estimated Length of Stay: 7-10 days  PT Treatment/Interventions: Ambulation/gait training;Balance/vestibular training;Cognitive remediation/compensation;Community reintegration;Discharge planning;DME/adaptive equipment instruction;Functional mobility training;Neuromuscular re-education;Pain management;Patient/family education;Psychosocial support;Stair training;Therapeutic Activities;Therapeutic Exercise;UE/LE Strength taining/ROM;UE/LE Coordination activities;Visual/perceptual remediation/compensation;Wheelchair propulsion/positioning PT Transfers Anticipated Outcome(s): Mod I with LRAD PT Locomotion Anticipated Outcome(s): Mod I with LRAD PT Recommendation Follow Up Recommendations: Home health PT;Outpatient PT Equipment Recommended: Rolling walker with 5" wheels  Skilled Therapeutic Intervention PT eval performed and treatment initiated. See below for results. Patient instructed in gait training with RW for 125 ft and without RW for 139f. Min A provided to prevent LOB with out RW and CGA provided with RW. Gait speed measured with RW: 0.873m. And Without RW: 0.69m48m Patient instructed in care transfer with min A for improved safety and cues for improved safety with technique.  Gait over uneven surface with RW with min A.  Patient instructed in stair training with min A and BUE support for 4 steps with step over step pattern.  Patient returned to room in bed with call bell within reach and all other needs. Met.   PT Evaluation Precautions/Restrictions Precautions Precautions: Fall Restrictions Weight Bearing Restrictions: No General Chart Reviewed: Yes Additional Pertinent History: Macular degeneration  Family/Caregiver Present: Yes Vital SignsTherapy Vitals Temp: 97.9 F (36.6 C) Temp Source: Oral Pulse Rate: 84 Resp: 18 BP: (!) 106/56  mmHg Patient Position (if appropriate): Lying Oxygen Therapy SpO2: 98 % O2 Device: Not Delivered Pain Pain Assessment Pain Assessment: 0-10 Pain Score: 8  Pain Type: Acute pain Pain Location: Head Pain Orientation: Mid Pain Descriptors / Indicators: Aching Pain Onset: On-going Patients Stated Pain Goal: 6 Pain Intervention(s): Ambulation/increased activity Home Living/Prior Functioning Home Living Available Help at Discharge: Family;Available 24 hours/day Type of Home: House Home Access: Stairs to enter [employer] Steps: 4 steps front entry, 8 steps steps back entry. 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 limited by chronic visual impairment  Cognition Overall Cognitive Status: Impaired/Different from baseline Arousal/Alertness: Awake/alert Orientation Level: Oriented to person;Oriented to place Attention: Sustained Focused Attention: Appears intact Sustained Attention: Impaired Sustained Attention Impairment: Verbal complex;Functional basic Memory: Impaired Memory Impairment: Decreased recall of new information Awareness: Impaired Awareness Impairment: Emergent impairment Problem Solving: Impaired Problem Solving Impairment: Verbal basic Executive Function:  (all impaired given lower level deficit) Sequencing: Impaired Sequencing Impairment: Verbal complex;Functional complex Organizing: Impaired Organizing Impairment: Verbal  complex;Functional complex Behaviors: Impulsive Safety/Judgment: Impaired Comments: Procedural memory impaired as evidenced by intermittent non-compliance with directions and warnings given during BADL Sensation Sensation Light Touch: Appears Intact Stereognosis: Appears Intact Hot/Cold: Appears Intact Proprioception: Appears Intact Coordination Gross Motor Movements are Fluid and Coordinated: Yes Fine Motor Movements are Fluid and Coordinated: No Finger Nose Finger Test: mild dysmertia and past shooting Motor  Motor Motor: Within Functional Limits  Mobility Bed Mobility  Bed Mobility: Rolling Right;Rolling Left;Sitting - Scoot to Edge of Bed;Sit to Supine;Supine to Sit Rolling Right: 5: Supervision Rolling Right Details: Verbal cues for technique;Verbal cues for sequencing Rolling Left: 5: Supervision Rolling Left Details: Verbal cues for technique Supine to Sit: 5: Supervision Supine to Sit Details: Verbal cues for technique;Verbal cues for sequencing Sitting - Scoot to Edge of Bed: 5: Supervision Sitting - Scoot to Edge of Bed Details: Verbal cues for sequencing;Verbal cues for technique Sit to Supine: 5: Supervision Transfers Transfers: Yes Sit to Stand: 5: Supervision;4: Min guard Sit to Stand Details: Verbal cues for technique;Verbal cues for sequencing Stand to Sit: 4: Min guard Stand Pivot Transfers: 4: Min Psychologist, occupational Details: Verbal cues for sequencing;Verbal cues for technique;Verbal cues for precautions/safety Locomotion    RW for 125 ft with min A from PT for improved safety  Trunk/Postural Assessment  Cervical Assessment Cervical Assessment: Within Functional Limits Thoracic Assessment Thoracic Assessment: Within Functional Limits Lumbar Assessment Lumbar Assessment: Within Functional Limits Postural Control Postural Control: Within Functional Limits  Balance Balance Balance Assessed: Yes Static Sitting Balance Static Sitting - Comment/#  of Minutes: WNL Dynamic Sitting Balance Sitting balance - Comments: WNL Static Standing Balance Static Standing - Comment/# of Minutes: WNL  Dynamic Standing Balance Dynamic Standing - Comments: needs mn A to prevent falls Extremity Assessment  RUE Assessment RUE Assessment: Within Functional Limits LUE Assessment LUE Assessment: Within Functional Limits RLE Assessment RLE Assessment: Within Functional Limits LLE Assessment LLE Assessment: Within Functional Limits   See Function Navigator for Current Functional Status.   Refer to Care Plan for Long Term Goals  Recommendations for other services: None  Discharge Criteria: Patient will be discharged from PT if patient refuses treatment 3 consecutive times without medical reason, if treatment goals not met, if there is a change in medical status, if patient makes no progress towards goals or if patient is discharged from hospital.  The above assessment, treatment plan, treatment alternatives and goals were discussed and mutually agreed upon: by patient and by family  Lorie Phenix 11/29/2015, 2:53 PM

## 2015-11-29 NOTE — Evaluation (Signed)
Speech Language Pathology Assessment and Plan  Patient Details  Name: David Duke MRN: [id number] Date of Birth: 1935/03/19  SLP Diagnosis: Cognitive Impairments;Speech and Language deficits  Rehab Potential: Good ELOS: 10-12 days    Today's Date: 11/29/2015 SLP Individual Time: 0800-0900 SLP Individual Time Calculation (min): 60 min   Problem List:  Patient Active Problem List   Diagnosis Date Noted  . 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 Gait disturbance, post-stroke   . Ataxia, late effect of cerebrovascular disease   . BPH (benign prostatic hyperplasia)   . Urinary retention   . Adjustment disorder with mixed anxiety and depressed mood   . Chronic obstructive pulmonary disease ([place])   . Cephalalgia   . AKI (acute kidney injury) ([place])   . Macular degeneration   . Prediabetes   . Hypokalemia   . Absolute anemia   . Aphasia   . Acute encephalopathy 11/25/2015  . Hyponatremia 11/25/2015  . Acute kidney injury () 11/25/2015  . Depression   . Anxiety   . COPD (chronic obstructive pulmonary disease) ([place])   . ICH (intracerebral hemorrhage) ([place]) 11/22/2015   Past Medical History:  Past Medical History  Diagnosis Date  . High cholesterol   . Depression   . Anxiety   . Macular degeneration of both eyes   . Blind right eye   . COPD (chronic obstructive pulmonary disease) ([place])   . Shortness of breath dyspnea    Past Surgical History:  Past Surgical History  Procedure Laterality Date  . No past surgeries      Assessment / Plan / Recommendation  Clinical Impression   David Duke is a 81 y.o. ambidextrous male with history of macular degeneration with blindness right eye, anxiety disorder, COPD/tobacco abuse. He was admitted from Dr. Dahlia Bailiff office with blurred vision left eye as well as confusion. Blood pressure 145/98. Cranial CT scan showed acute intraparenchymal hemorrhage within the left lower parietal occipital lobe  measuring 5.6 x 2.9 cm with surrounding edema causing slight mass effect without midline shift. Echocardiogram showed EF f 84% grade 1 diastolic dysfunction. EEG nonspecific finding indicating of mild diffuse cerebral dysfunction no seizure activity. Carotid Dopplers was negative for ICA stenosis. Patient did not receive TPA due to intracranial hemorrhage. Follow-up cranial CT scan 11/26/2015 showing expected evolutionary changes with some contraction of the hyperdense clot in the left posterior temporal occipital region. No change in mass effect.. Patient has been unable to tolerate attempts at MRI due to claustrophobia. Agitation has resolved but he continues to have bouts of confusion. Dr. Leonie Man questioned CAA v/s hypertensive bleed. He has had issues with worsening of HA as well as urinary retention requiring foley. Had febrile episode with leucocytosis but no signs of infection on work up. Therapy ongoing and patient with significant cognitive deficits, balance deficits as well as generalize weakness affecting mobility. Pt presents with moderate cognitive-linguistic limitations with underlying memory impairments. Per spouse, pt's performance is variable throughout the day. Limitations in therapy include premorobid limited vision in the R eye secondary to macular degeneration and HOH. Prior to the L parieto-occipital ICH pt was fairly independent at home including medication management and expresses interest in returning to maximum function.   Skilled Therapeutic Interventions          The MoCA-Blind was initiated, however was not finished secondary to pt's attention level and fixation on throat and head discomfort. RN was made aware of pt's complaints. Pt verbalized difficulty with  swallowing, however appeared to manage all consistencies WFL. Ultimately, it appeared that pt was having sensation of a sore throat that was irritating, but not resulting in dysphagia. Confrontation naming completed with 10/10  acc. Pt able to follow 1 and 2 step directions with 100% acc with min A of repetition secondary to limitations with working memory versus hearing impairment.   SLP Assessment  Patient will need skilled Speech Lanaguage Pathology Services during CIR admission    Recommendations  SLP Diet Recommendations: Age appropriate regular solids Liquid Administration via: Straw;Cup Medication Administration: Whole meds with puree Supervision: Patient able to self feed;Intermittent supervision to cue for compensatory strategies Compensations: Minimize environmental distractions;Small sips/bites Postural Changes and/or Swallow Maneuvers: Seated upright 90 degrees Oral Care Recommendations: Oral care BID Recommendations for Other Services: Neuropsych consult Patient destination: Home Follow up Recommendations: Outpatient SLP Equipment Recommended: None recommended by SLP    SLP Frequency 3 to 5 out of 7 days   SLP Duration  SLP Intensity  SLP Treatment/Interventions 10-12 days  Minumum of 1-2 x/day, 30 to 90 minutes  Cognitive remediation/compensation;Cueing hierarchy;Functional tasks;Speech/Language facilitation;Therapeutic Activities;Medication managment;Patient/family education    Pain Pain Assessment Pain Assessment: 0-10 Pain Score: 8  Pain Type: Acute pain Pain Location: Head Pain Orientation: Mid Pain Descriptors / Indicators: Aching Pain Onset: On-going Patients Stated Pain Goal: 6 Pain Intervention(s): Ambulation/increased activity  Prior Functioning Cognitive/Linguistic Baseline: Baseline deficits Baseline deficit details: some memory impairment, visual limitations, hearing limitations Type of Home: House  Lives With: Spouse Available Help at Discharge: Family;Available 24 hours/day Vocation: Retired  Function:  Eating Eating   Modified Consistency Diet: Yes Eating Assist Level: Set up assist for;Supervision or verbal cues           Cognition Comprehension  Comprehension assist level: Understands basic 75 - 89% of the time/ requires cueing 10 - 24% of the time  Expression   Expression assist level: Expresses basic 75 - 89% of the time/requires cueing 10 - 24% of the time. Needs helper to occlude trach/needs to repeat words.  Social Interaction Social Interaction assist level: Interacts appropriately 75 - 89% of the time - Needs redirection for appropriate language or to initiate interaction.  Problem Solving Problem solving assist level: Solves basic 75 - 89% of the time/requires cueing 10 - 24% of the time  Memory Memory assist level: Recognizes or recalls 50 - 74% of the time/requires cueing 25 - 49% of the time   Short Term Goals: Week 1: SLP Short Term Goal 1 (Week 1): Pt to consistently demonstrate O x 4 with min A.  SLP Short Term Goal 2 (Week 1): Pt to demonstrate recall of daily activities with mod A of questioning cues.  SLP Short Term Goal 3 (Week 1): Pt to demonstrate ability to manage meds with max A.  SLP Short Term Goal 4 (Week 1): Pt to demonstrate ability sustain attention to task for 5 min with mod A.   Refer to Care Plan for Long Term Goals  Recommendations for other services: Neuropsych  Discharge Criteria: Patient will be discharged from SLP if patient refuses treatment 3 consecutive times without medical reason, if treatment goals not met, if there is a change in medical status, if patient makes no progress towards goals or if patient is discharged from hospital.  The above assessment, treatment plan, treatment alternatives and goals were discussed and mutually agreed upon: by patient and by family  Vinetta Bergamo 11/29/2015, 3:38 PM

## 2015-11-29 NOTE — Evaluation (Signed)
Occupational Therapy Assessment and Plan  Patient Details  Name: David Duke MRN: [id number] Date of Birth: [birth year]  OT Diagnosis: cognitive deficits and muscle weakness (generalized) Rehab Potential: Rehab Potential (ACUTE ONLY): Good ELOS: 7-10 days   Today's Date: 11/29/2015 OT Individual Time: 4193-7902 OT Individual Time Calculation (min): 75 min     Problem List:  Patient Active Problem List   Diagnosis Date Noted  . 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 Gait disturbance, post-stroke   . Ataxia, late effect of cerebrovascular disease   . BPH (benign prostatic hyperplasia)   . Urinary retention   . Adjustment disorder with mixed anxiety and depressed mood   . Chronic obstructive pulmonary disease ([place])   . Cephalalgia   . AKI (acute kidney injury) ([place])   . Macular degeneration   . Prediabetes   . Hypokalemia   . Absolute anemia   . Aphasia   . Acute encephalopathy 11/25/2015  . Hyponatremia 11/25/2015  . Acute kidney injury ([place]) 11/25/2015  . Depression   . Anxiety   . COPD (chronic obstructive pulmonary disease) ([place])   . ICH (intracerebral hemorrhage) ([place]) 11/22/2015    Past Medical History:  Past Medical History  Diagnosis Date  . High cholesterol   . Depression   . Anxiety   . Macular degeneration of both eyes   . Blind right eye   . COPD (chronic obstructive pulmonary disease) ([place])   . Shortness of breath dyspnea    Past Surgical History:  Past Surgical History  Procedure Laterality Date  . No past surgeries      Assessment & Plan Clinical Impression: Patient is a 81 y.o. ambidextrous male with history of macular degeneration with blindness right eye, anxiety disorder, COPD/tobacco abuse. Admitted from Dr. Dahlia Bailiff office with blurred vision left eye as well as confusion. CT scan showed acute intraparenchymal hemorrhage within the left lower parietal occipital lobe measuring 5.6 x 2.9 cm with surrounding edema  causing slight mass effect without midline shift. Follow-up cranial CT scan 11/26/2015 showing expected evolutionary changes with some contraction of the hyperdense clot in the left posterior temporal occipital region. Patient has been unable to tolerate attempts at MRI due to claustrophobia. Agitation has resolved but he continues to have bouts of confusion. Therapy ongoing and patient with significant cognitive deficits, balance deficits as well as generalize weakness affecting mobility.     Patient transferred to CIR on 11/28/2015 .    Patient currently requires min with basic self-care skills secondary to muscle weakness and decreased attention and decreased safety awareness.  Prior to hospitalization, patient could complete BADL independently.  Patient will benefit from skilled intervention to increase independence with basic self-care skills prior to discharge home with care partner.  Anticipate patient will require intermittent supervision and follow up home health.  OT - End of Session Activity Tolerance: Tolerates 30+ min activity with multiple rests Endurance Deficit: Yes OT Assessment Rehab Potential (ACUTE ONLY): Good Barriers to Discharge: Decreased caregiver support Barriers to Discharge Comments: Spouse unable to drive d/t hx of seizures (reports network of friends will assist) OT Patient demonstrates impairments in the following area(s): Balance;Cognition;Vision;Safety;Endurance OT Basic ADL's Functional Problem(s): Eating;Grooming;Bathing;Dressing;Toileting OT Transfers Functional Problem(s): Toilet;Tub/Shower OT Additional Impairment(s): None OT Plan OT Intensity: Minimum of 1-2 x/day, 45 to 90 minutes OT Frequency: 5 out of 7 days OT Duration/Estimated Length of Stay: 7-10 days OT Treatment/Interventions: Balance/vestibular training;Patient/family education;Self Care/advanced ADL retraining;Therapeutic Activities;UE/LE Coordination activities;Therapeutic Exercise;UE/LE  Strength taining/ROM;Functional  mobility training;Discharge planning;Cognitive remediation/compensation;DME/adaptive equipment instruction OT Self Feeding Anticipated Outcome(s): Mod I OT Basic Self-Care Anticipated Outcome(s): Supervision OT Toileting Anticipated Outcome(s): Mod I  OT Bathroom Transfers Anticipated Outcome(s): Supervision OT Recommendation Patient destination: Home Follow Up Recommendations: Home health OT Equipment Recommended: To be determined Equipment Details: Retains access wife's w/c, crutches and shower chair from a prior LE injury, if needed.   Skilled Therapeutic Intervention OT 1:1 initial evaluation completed with treatment provided to address orientation, attention, awareness, functional mobility, transfers and endurance.   Pt very talkative although hard-of-hearing and required repeated instructions to clarify intent of task.   Pt able to perform bathing and dressing with close supervision, setup and steadying assist required during transfers and mobility.   Spouse present to confirm prior level of functioning, home environment, and pt's routines and interests during eval and treatment.   OT Evaluation Precautions/Restrictions  Precautions Precautions: Fall Restrictions Weight Bearing Restrictions: No  General Chart Reviewed: Yes Family/Caregiver Present: Yes  Vital Signs Therapy Vitals Temp: 97.9 F (36.6 C) Temp Source: Oral Pulse Rate: 84 Resp: 18 BP: (!) 106/56 mmHg Patient Position (if appropriate): Lying Oxygen Therapy SpO2: 98 % O2 Device: Not Delivered  Pain Pain Assessment Pain Assessment: No/denies pain Pain Score: 4  Pain Type: Acute pain Pain Location: Head Pain Orientation: Medial Pain Intervention(s): RN made aware  Home Living/Prior Functioning Home Living Available Help at Discharge: Family, Available 24 hours/day Type of Home: House Home Access: Stairs to enter [employer] of Steps: 4 steps front entry, 8  steps steps back entry. 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 limited by chronic visual impairment   Cognition Overall Cognitive Status: Impaired/Different from baseline Arousal/Alertness: Awake/alert Orientation Level: Person;Place;Situation Person: Oriented Place: Oriented Situation: Oriented Year: 2017 Month: March Day of Week: Correct Memory: Appears intact Immediate Memory Recall: Sock;Blue;Bed Memory Recall: Sock;Blue;Bed Memory Recall Sock: Without Cue Memory Recall Blue: Without Cue Memory Recall Bed: Without Cue Attention: Sustained Focused Attention: Appears intact Sustained Attention: Impaired Sustained Attention Impairment: Verbal complex;Functional complex Awareness:  Impaired Awareness Impairment: Emergent impairment Problem Solving: Impaired Problem Solving Impairment: Functional basic Sequencing: Impaired Sequencing Impairment: Verbal complex;Functional complex Organizing: Impaired Organizing Impairment: Verbal complex;Functional complex Behaviors: Impulsive Safety/Judgment: Impaired Comments: Procedural memory impaired as evidenced by intermittent non-compliance with directions and warnings given during BADL  Sensation Sensation Light Touch: Appears Intact Stereognosis: Appears Intact Hot/Cold: Appears Intact Proprioception: Appears Intact Coordination Gross Motor Movements are Fluid and Coordinated: Yes Fine Motor Movements are Fluid and Coordinated: Yes  Motor  Motor Motor: Within Functional Limits  Mobility  Transfers Transfers: Sit to Stand;Stand to Sit Sit to Stand: 4: Min guard Stand to Sit: 4: Min guard   Trunk/Postural Assessment  Cervical Assessment Cervical Assessment: Within Functional Limits Thoracic Assessment Thoracic Assessment: Within Functional Limits Lumbar Assessment Lumbar Assessment: Within Functional Limits Postural Control Postural Control: Within Functional Limits   Balance Balance Balance Assessed: Yes Static Sitting Balance Static Sitting - Comment/# of Minutes: WNL Dynamic Sitting Balance Sitting balance - Comments: WNL Static Standing Balance Static Standing - Comment/# of Minutes: WNL  Dynamic Standing Balance Dynamic Standing - Comments: needs mn A to prevent falls   Extremity/Trunk Assessment RUE Assessment RUE Assessment: Within Functional Limits LUE Assessment LUE Assessment: Within Functional Limits   See Function Navigator for Current Functional Status.   Refer to Care Plan for Long Term Goals  Recommendations for other services: None  Discharge Criteria: Patient will be discharged from OT if patient refuses treatment 3 consecutive times without medical reason, if treatment  goals not met, if there is a change in medical status, if patient makes no progress towards goals or if patient is discharged from hospital.  The above assessment, treatment plan, treatment alternatives and goals were discussed and mutually agreed upon: by patient and by family  [facility] 11/29/2015, 3:13 PM

## 2015-11-30 ENCOUNTER — Inpatient Hospital Stay (HOSPITAL_COMMUNITY): Payer: Medicare Other | Admitting: Speech Pathology

## 2015-11-30 ENCOUNTER — Inpatient Hospital Stay (HOSPITAL_COMMUNITY): Payer: Medicare Other

## 2015-11-30 ENCOUNTER — Inpatient Hospital Stay (HOSPITAL_COMMUNITY): Payer: Medicare Other | Admitting: Physical Therapy

## 2015-11-30 DIAGNOSIS — J0110 Acute frontal sinusitis, unspecified: Secondary | ICD-10-CM | POA: Diagnosis present

## 2015-11-30 LAB — URINE CULTURE: Culture: 2000

## 2015-11-30 MED ORDER — AMOXICILLIN-POT CLAVULANATE 875-125 MG PO TABS
1.0000 | ORAL_TABLET | Freq: Two times a day (BID) | ORAL | Status: AC
  Administered 2015-11-30 – 2015-12-06 (×14): 1 via ORAL
  Filled 2015-11-30 (×14): qty 1

## 2015-11-30 MED ORDER — SACCHAROMYCES BOULARDII 250 MG PO CAPS
250.0000 mg | ORAL_CAPSULE | Freq: Two times a day (BID) | ORAL | Status: DC
  Administered 2015-11-30 – 2015-12-11 (×23): 250 mg via ORAL
  Filled 2015-11-30 (×24): qty 1

## 2015-11-30 MED ORDER — PNEUMOCOCCAL VAC POLYVALENT 25 MCG/0.5ML IJ INJ: 0.5000 mL | INJECTION | INTRAMUSCULAR | Status: DC | PRN

## 2015-11-30 MED ORDER — INFLUENZA VAC SPLIT QUAD 0.5 ML IM SUSY: 0.5000 mL | PREFILLED_SYRINGE | INTRAMUSCULAR | Status: DC | PRN

## 2015-11-30 NOTE — Progress Notes (Signed)
[facility] PHYSICAL MEDICINE & REHABILITATION     PROGRESS NOTE  Subjective/Complaints:  Patient sitting up in bed this morning. He and his wife stated he was not able to sleep well last night. He complains of a headache this morning. He has complains of tenderness around his orbits and his neck. He did mention that he injured his day therapies yesterday.  ROS: + Neck tenderness, periorbital tenderness, headache. Denies CP, SOB, N/V/D  Objective: Vital Signs: Blood pressure 138/81, pulse 72, temperature 98.2 F (36.8 C), temperature source Oral, resp. rate 18, height 5\' 8"  (1.727 m), weight 63.9 kg (140 lb 14 oz), SpO2 97 %. Dg Chest 2 View  11/28/2015  CLINICAL DATA:  Chest tightness.  COPD. EXAM: CHEST  2 VIEW COMPARISON:  11/25/2015. FINDINGS: AP semi-erect chest demonstrates normal heart size with clear lung fields. No bony abnormality. Similar appearance to priors. IMPRESSION: No edema or consolidation. Electronically Signed   By: Elsie StainJohn T Curnes M.D.   On: 11/28/2015 16:43    Recent Labs  11/29/15 0441  WBC 10.2  HGB 12.2*  HCT 35.9*  PLT 214    Recent Labs  11/29/15 0441  NA 141  K 3.5  CL 106  GLUCOSE 108*  BUN 10  CREATININE 1.32*  CALCIUM 8.7*   CBG (last 3)   Recent Labs  11/28/15 0635 11/28/15 1129 11/28/15 1645  GLUCAP 106* 99 89    Wt Readings from Last 3 Encounters:  11/28/15 63.9 kg (140 lb 14 oz)  11/22/15 64.1 kg (141 lb 5 oz)  03/31/15 67.586 kg (149 lb)    Physical Exam:  BP 138/81 mmHg  Pulse 72  Temp(Src) 98.2 F (36.8 C) (Oral)  Resp 18  Ht 5\' 8"  (1.727 m)  Wt 63.9 kg (140 lb 14 oz)  BMI 21.42 kg/m2  SpO2 97% Constitutional: He appears well-developed and well-nourished. NAD.  HENT: Normocephalic and atraumatic.  TTP periorbital, neck. Eyes: Conjunctivae and EOM are normal.  Cardiovascular: Normal rate and regular rhythm.No murmur heard. Respiratory: Effort normal. No respiratory distress.  GI: Soft. Bowel sounds are normal. He  exhibits no distension. There is no tenderness.  Musculoskeletal: He exhibits no edema or tenderness.  Neurological: He is alert.  Has poor safety awareness.  Mild ataxia with BUE.  A&Ox2 Motor: B/l UE: 5/5 proximal to distal B/l LE 5/5 proximal to distal  Skin: Skin is warm and dry. No rash noted. No erythema.  Psychiatric: His speech is tangential. Cognition and memory are impaired.  Behaviors slowed at times.   Assessment/Plan: 1. 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 Physiatrist is providing close team supervision and 24 hour management of active medical problems listed below. Physiatrist and rehab team continue to assess barriers to discharge/monitor patient progress toward functional and medical goals.  Function:  Bathing Bathing position   Position: Shower  Bathing parts Body parts bathed by patient: Right arm, Left arm, Chest, Abdomen, Front perineal area, Buttocks, Right upper leg, Left upper leg, Right lower leg, Left lower leg Body parts bathed by helper: Back  Bathing assist Assist Level: Touching or steadying assistance(Pt > 75%)      Upper Body Dressing/Undressing Upper body dressing   What is the patient wearing?: Pull over shirt/dress     Pull over shirt/dress - Perfomed by patient: Thread/unthread right sleeve, Thread/unthread left sleeve, Put head through opening, Pull shirt over trunk  Upper body assist Assist Level: Set up   Set up : To obtain clothing/put away  Lower Body Dressing/Undressing Lower body dressing   What is the patient wearing?: Underwear, Pants, Non-skid slipper socks Underwear - Performed by patient: Thread/unthread right underwear leg, Thread/unthread left underwear leg, Pull underwear up/down   Pants- Performed by patient: Thread/unthread right pants leg, Thread/unthread left pants leg, Pull pants up/down,  Fasten/unfasten pants   Non-skid slipper socks- Performed by patient: Don/doff right sock, Don/doff left sock                    Lower body assist Assist for lower body dressing: Touching or steadying assistance (Pt > 75%)      Toileting Toileting Toileting activity did not occur: Refused        Toileting assist     Transfers Chair/bed transfer   Chair/bed transfer method: Ambulatory Chair/bed transfer assist level: Touching or steadying assistance (Pt > 75%) Chair/bed transfer assistive device: Walker, Other (no AD)     Locomotion Ambulation     Max distance: 125 ft Assist level: Touching or steadying assistance (Pt > 75%)   Wheelchair Wheelchair activity did not occur: N/A        Cognition Comprehension Comprehension assist level: Understands basic 75 - 89% of the time/ requires cueing 10 - 24% of the time  Expression Expression assist level: Expresses basic 75 - 89% of the time/requires cueing 10 - 24% of the time. Needs helper to occlude trach/needs to repeat words.  Social Interaction Social Interaction assist level: Interacts appropriately 75 - 89% of the time - Needs redirection for appropriate language or to initiate interaction.  Problem Solving Problem solving assist level: Solves basic 75 - 89% of the time/requires cueing 10 - 24% of the time  Memory Memory assist level: Recognizes or recalls 50 - 74% of the time/requires cueing 25 - 49% of the time    Medical Problem List and Plan: 1. Balance deficits and poor cognition secondary to left ICH.  Continue CIR  2. DVT Prophylaxis/Anticoagulation: Mechanical: Sequential compression devices, below knee Bilateral lower extremities 3. Pain Management: Tylenol prn for pain 4. Mood: LCSW to follow for evaluation and support.  5. Neuropsych: This patient is not fully capable of making decisions on his own behalf. 6. Skin/Wound Care: routine pressure relief measures. 7. Fluids/Electrolytes/Nutrition: Monitor  I/O.   BMP improving on 3/23 8. H/o BPH/Urinary retention:    Will DC Foley  UA with rare bacteria on 3/23, urine culture pending (NGTD)   Changed flomax to rapaflo adjusted for kidney function.  9. Depression/PTSD/anxiety: On Zoloft daily with valium prn.  10. COPD: Uses albuterol daily due to "tightness' Also has issues with congestion.  Chest x-ray reviewed from 3/22, relatively unremarkable  Changed schedule nebs to when necessary on 3/23.  11. Headaches: Gets one "every afternoon".   Fioricet changed to Topamax 25 mg on 3/23.   Added Claritin and Flonase to help with nasal/sinus congestion.  12. Sundowning: wife reporting intermittent confusion worse in the evenings.   Continue to reorient and adjust sleep-wake cycle.  13. Prediabetes: Periodically monitor 14. AKI: continue to monitor. Avoid nephrotoxic meds  Creatinine 1.32 on 3/23 (improving)  15: Hypokalemia: Will continue to monitor   3.5 on 3/23  Will continue to monitor  16. Sinusitis  Augmentin started 3/24-3/31  LOS (Days) 2 A FACE TO FACE EVALUATION WAS PERFORMED  Ankit Karis Juba 11/30/2015 9:17 AM

## 2015-11-30 NOTE — Progress Notes (Signed)
Speech Language Pathology Daily Session Note  Patient Details  Name: David Duke MRN: [id number] Date of Birth: 12/17/1934  Today's Date: 11/30/2015 SLP Individual Time: 0930-1030 SLP Individual Time Calculation (min): 60 min  Short Term Goals: Week 1: SLP Short Term Goal 1 (Week 1): Pt to consistently demonstrate O x 4 with min A.  SLP Short Term Goal 2 (Week 1): Pt to demonstrate recall of daily activities with mod A of questioning cues.  SLP Short Term Goal 3 (Week 1): Pt to demonstrate ability to manage meds with max A.  SLP Short Term Goal 4 (Week 1): Pt to demonstrate ability sustain attention to task for 5 min with mod A.   Skilled Therapeutic Interventions: Skilled ST session focused on cognition goals. Pt wife present in session. When ST initially entered room, nurse was administering medicine - whole with thin liquids. Pt became mildly strangled with whole pill. At that time, nurse administered whole pills with applesauce and the pt with no further s/s of aspiration/dysphagia. ST continues to recommend (per original ST evaluation) that medicine be consumed whole with puree/applesauce. ST facilitated cognition activities by providing mod to max verbal cues to sustain attention for 2 to 3 minutes in a quiet environment. Pt required max verbal, tactile, visual and demonstration cues for medication management task. Pt able to recall previous OT with only min verbal cues but was confused about general information within his setting. For example, he offered his eyeglasses to SLP as he thought they were not his - even though he had just had them on. Pt had difficulty preparing for each task and required frequent verbal redirection to task. For example, he frequently alternated between putting on his eyeglasses and then his sunglasses and needed frequent redirection to tasks. Pt stated that he continues to have a headache and nursing was aware and administered medicine at beginning of session.  Pt left upright in chair with wife present. All needs within reach. Continue current plan of care.   Function:   Cognition Comprehension Comprehension assist level: Understands basic 50 - 74% of the time/ requires cueing 25 - 49% of the time  Expression   Expression assist level: Expresses basic 50 - 74% of the time/requires cueing 25 - 49% of the time. Needs to repeat parts of sentences.  Social Interaction Social Interaction assist level: Interacts appropriately 75 - 89% of the time - Needs redirection for appropriate language or to initiate interaction.  Problem Solving Problem solving assist level: Solves basic 25 - 49% of the time - needs direction more than half the time to initiate, plan or complete simple activities  Memory Memory assist level: Recognizes or recalls 25 - 49% of the time/requires cueing 50 - 75% of the time    Pain Pain Assessment Pain Score: 4   Therapy/Group: Individual Therapy  Costella Schwarz 11/30/2015, 2:17 PM

## 2015-11-30 NOTE — Progress Notes (Signed)
Speech Language Pathology Daily Session Note  Patient Details  Name: David BarlowDavid R Lipps MRN: [id number] Date of Birth: 12/07/1934  Today's Date: 11/30/2015 SLP Individual Time: 1135-1207 SLP Individual Time Calculation (min): 32 min  Short Term Goals: Week 1: SLP Short Term Goal 1 (Week 1): Pt to consistently demonstrate O x 4 with min A.  SLP Short Term Goal 2 (Week 1): Pt to demonstrate recall of daily activities with mod A of questioning cues.  SLP Short Term Goal 3 (Week 1): Pt to demonstrate ability to manage meds with max A.  SLP Short Term Goal 4 (Week 1): Pt to demonstrate ability sustain attention to task for 5 min with mod A.   Skilled Therapeutic Interventions:  Pt was seen for skilled ST targeting cognitive goals.  Pt asleep upon arrival but easily awakened to voice and light touch.  Pt required min assist verbal cues and increased processing time for initiation when transferring from bed to wheelchair.  Pt very verbose and easily distracted to both internal and external stimuli, needing mod assist verbal cues for redirection.  Pt needed min verbal cues to reorient to place, max assist verbal cues/choice of three to reorient to situation.  Pt with poor intellectual awareness of deficits and required max assist verbal cues to identify at least 1 deficit occurring post ICH.  Pt's wife was present and also noted with verbosity which was distracting and overstimulating to pt as evidenced by increased verbal frustration.  SLP provided skilled education regarding distraction management techniques, providing pt with extra time for processing during tasks, and limiting interactions to ~20 minute intervals with only 1-2 conversational partners to prevent overstimulation.  Pt's wife verbalized understanding but will need continued reinforcement.  Pt was left in wheelchair with quick release belt donned and wife at bedside.  Continue per current plan of care.    Function:  Eating Eating                  Cognition Comprehension Comprehension assist level: Understands basic 50 - 74% of the time/ requires cueing 25 - 49% of the time  Expression   Expression assist level: Expresses basic 75 - 89% of the time/requires cueing 10 - 24% of the time. Needs helper to occlude trach/needs to repeat words.  Social Interaction Social Interaction assist level: Interacts appropriately 75 - 89% of the time - Needs redirection for appropriate language or to initiate interaction.  Problem Solving Problem solving assist level: Solves basic 25 - 49% of the time - needs direction more than half the time to initiate, plan or complete simple activities  Memory Memory assist level: Recognizes or recalls 25 - 49% of the time/requires cueing 50 - 75% of the time    Pain Pain Assessment Pain Assessment: No/denies pain  Therapy/Group: Individual Therapy  Canary Fister, Melanee SpryNicole L 11/30/2015, 2:37 PM

## 2015-11-30 NOTE — Progress Notes (Signed)
Occupational Therapy Session Note  Patient Details  Name: David BarlowDavid R Graffius MRN: [id number] Date of Birth: 01/07/1935  Today's Date: 11/30/2015 OT Individual Time: 4098-11910830-0930 OT Individual Time Calculation (min): 60 min   Short Term Goals: Week 1:  OT Short Term Goal 1 (Week 1): STG=LTG d/t short anticipated LOS  Skilled Therapeutic Interventions/Progress Updates: ADL-retraining at bed/sink with focus on improved attention, transfers, functional mobility, safety awareness, family ed (wife present), standing balance and grooming skills.   Pt received in bed, HOB elevated finishing his breakfast.   Pt required reorientation to treatment goals and extra time to process instructions due to attention deficit requiring repeated directions and termination of tangential dialogue.   Pt reported poor sleep and acute headache (possible localized to sinus and lymph nodes).   RN alerted to need for pain meds.  Pt requested escort to toilet appropriately, with hand guidance to ambulate and steadying assist during transfers.   Pt voided BM and returned to w/c with request for assist with shaving d/t substantial beard growth.   OT provided total assist to remove beard using surgical clippers.   Pt then returned to sink to bathe upper body, dress upper body and groom while standing with overall min assist and extra time due to need for rest break.   Pt was distracted by pain from headache but remained in w/c at end of session.   Pt required repeated reminders to only stand with assist during session however impulsive behaviors appear greatly influenced by pain and sleep deprivation.     Therapy Documentation Precautions:  Precautions Precautions: Fall Restrictions Weight Bearing Restrictions: No  Pain: Pain Assessment Pain Assessment: 0-10 Pain Score: 10-Worst pain ever Pain Type: Acute pain Pain Location: Head Pain Orientation: Anterior Pain Descriptors / Indicators: Nagging Pain Onset: On-going Patients  Stated Pain Goal: 3  ADL: ADL ADL Comments: see Functional Assessment Tool  See Function Navigator for Current Functional Status.   Therapy/Group: Individual Therapy  Himmat Enberg 11/30/2015, 9:54 AM

## 2015-11-30 NOTE — Progress Notes (Signed)
Physical Therapy Session Note  Patient Details  Name: David Duke MRN: [id number] Date of Birth: August 06, 1935  Today's Date: 11/30/2015 PT Individual Time: 0093-8182 PT Individual Time Calculation (min): 56 min   Short Term Goals: Week 1:   LTG =STG d/t short LOS.   Skilled Therapeutic Interventions/Progress Updates:    Patient received supine in bed and agreeable to PT. Patient performed bed mobility with supervision and cues for catheter management. Gait training performed in hall for 159f with RW with supervision A and multiple cues to maintain BUE on RW while walking. Patient verbalized understanding, but lifted one hand off walker 3 times to fix pants while maintaining forward progression, requiring min A from PT to prevent LOB x1. PT instructed patient in Berg balance test and DGI see below for results.  Gait to room for 2050fwith RW, with the goal of finding room. Patient required moderate assist due to lack of central vision in Bilateral eyes and decreased awareness of location. PT provided problem solving strategies to help patient reach destination.   Patient left supine in bed with call bell with reach, bed alarm set and all other needs met.   Therapy Documentation Precautions:  Precautions Precautions: Fall Restrictions Weight Bearing Restrictions: No General:   Vital Signs: Therapy Vitals Temp: 98.6 F (37 C) Temp Source: Oral Pulse Rate: 81 Resp: 18 BP: 127/74 mmHg Patient Position (if appropriate): Lying Oxygen Therapy SpO2: 97 % O2 Device: Not Delivered Pain: Pain Assessment Pain Assessment: No/denies pain  Balance: Balance Balance Assessed: Yes Standardized Balance Assessment Standardized Balance Assessment: Berg Balance Test;Dynamic Gait Index Berg Balance Test Sit to Stand: Able to stand without using hands and stabilize independently Standing Unsupported: Able to stand safely 2 minutes Sitting with Back Unsupported but Feet Supported on Floor  or Stool: Able to sit safely and securely 2 minutes Stand to Sit: Sits safely with minimal use of hands Transfers: Able to transfer safely, minor use of hands Standing Unsupported with Eyes Closed: Able to stand 10 seconds with supervision Standing Ubsupported with Feet Together: Able to place feet together independently and stand for 1 minute with supervision From Standing, Reach Forward with Outstretched Arm: Can reach confidently >25 cm (10") From Standing Position, Pick up Object from Floor: Able to pick up shoe safely and easily From Standing Position, Turn to Look Behind Over each Shoulder: Looks behind from both sides and weight shifts well Turn 360 Degrees: Able to turn 360 degrees safely one side only in 4 seconds or less Standing Unsupported, Alternately Place Feet on Step/Stool: Able to complete >2 steps/needs minimal assist Standing Unsupported, One Foot in Front: Able to take small step independently and hold 30 seconds Standing on One Leg: Able to lift leg independently and hold equal to or more than 3 seconds Total Score: 46  Patient demonstrates increased fall risk as noted by score of  46 /56 on Berg Balance Scale.  (<36= high risk for falls, close to 100%; 37-45 significant >80%; 46-51 moderate >50%; 52-55 lower >25%)   Dynamic Gait Index Level Surface: Normal Change in Gait Speed: Mild Impairment Gait with Horizontal Head Turns: Mild Impairment Gait with Vertical Head Turns: Moderate Impairment Gait and Pivot Turn: Mild Impairment Step Over Obstacle: Mild Impairment Step Around Obstacles: Normal Steps: Mild Impairment Total Score: 17  <19 indicates increased risk of falls.       See Function Navigator for Current Functional Status.   Therapy/Group: Individual Therapy  AuLorie Phenix/24/2017, 5:15 PM

## 2015-12-01 ENCOUNTER — Inpatient Hospital Stay (HOSPITAL_COMMUNITY): Payer: Medicare Other | Admitting: Speech Pathology

## 2015-12-01 ENCOUNTER — Inpatient Hospital Stay (HOSPITAL_COMMUNITY): Payer: Medicare Other | Admitting: Occupational Therapy

## 2015-12-01 ENCOUNTER — Inpatient Hospital Stay (HOSPITAL_COMMUNITY): Payer: Medicare Other | Admitting: Physical Therapy

## 2015-12-01 DIAGNOSIS — J329 Chronic sinusitis, unspecified: Secondary | ICD-10-CM

## 2015-12-01 NOTE — Progress Notes (Addendum)
Occupational Therapy Session Note  Patient Details  Name: David Duke MRN: [id number] Date of Birth: 11/22/1934  Today's Date: 12/01/2015 OT Individual Time: 1191-47820730-0840 OT Individual Time Calculation (min): 70 min    Skilled Therapeutic Interventions/Progress Updates: Patient was confused and required extra time for orientation to place and situation.  Patient constantly during the session continued to talk about the difficulty staff had with trying to apply a new cathether last night (This is per patient report).   Though he continued to focus and talk about last night, he concurred to work on upper body bathing and upper body dressing.  For the bathing and dressing tasks, patient required min a to find arm right arm hole and constant verbal cues to utilize selective attention (wife was in the room and talked on occasion) and focus to initiate bathing and dressing and to stay focused on the task.  With close supervision, He was able to stand dynamically at the edge of the bed to pull up and fasten his pants.  His dynamic balance was fair for this task.  Wife was present and supportive during the session.  After session was over, wife initiating speaking with this clinician stating that her husband is "mean and short" most of the time during her visits and overnight stays here.   This clinician gave an open forum to speak regarding her concerns and about the possibility of not staying the nights or having her brother in law or step son stay overnight here at the hospital with David Duke.  David Duke stated that the patient requests she spend the nights, but that she plans to ask the 2 aforementioned family members to take turns with her.     Therapy Documentation Precautions:  Precautions Precautions: Fall Restrictions Weight Bearing Restrictions: No  Pain: 7/10 head and neck ache.  Cliniician assisted pt. With using call bell to ask for pain meds.    See Function Navigator  for Current Functional Status.   Therapy/Group: Individual Therapy  Bud Faceickett, Dejuan Elman [facility] 12/01/2015, 11:45 AM

## 2015-12-01 NOTE — Progress Notes (Signed)
Attempt to I & O cath after no void for 8 hours. Attempt x 2 with regular tip cath. Extremely uncomfortable with cath attempts and blood noted at meatus. Wife reports patient with enlarged prostate. Paged Dr. Posey ReaPlotnikov, order obtained to reinsert foley, using Coude tip cath. Hematuria initially, urine cleared. 300cc's returned after cath inserted. Patient confused with attempts OOB without asistance. Wife extremely anxious. At nurses station crying, wanting to talk to charge nurse about patient. Encouraged Her to stay home Saturday night and rest. She was yelling at patient frequently. Alfredo MartinezMurray, Sarh Kirschenbaum A

## 2015-12-01 NOTE — Progress Notes (Signed)
Speech Language Pathology Daily Session Note  Patient Details  Name: David BarlowDavid R Chauvin MRN: [id number] Date of Birth: 05/21/1935  Today's Date: 12/01/2015 SLP Individual Time: 1500-1530 SLP Individual Time Calculation (min): 30 min  Short Term Goals: Week 1: SLP Short Term Goal 1 (Week 1): Pt to consistently demonstrate O x 4 with min A.  SLP Short Term Goal 2 (Week 1): Pt to demonstrate recall of daily activities with mod A of questioning cues.  SLP Short Term Goal 3 (Week 1): Pt to demonstrate ability to manage meds with max A.  SLP Short Term Goal 4 (Week 1): Pt to demonstrate ability sustain attention to task for 5 min with mod A.   Skilled Therapeutic Interventions:  Pt was seen for skilled ST targeting cognitive goals.  Pt was ambulating in room with OT upon SLP's arrival, requesting to clean his dentures.  Pt required mod assist verbal and visual cues for error awareness and carryover as he repeatedly attempted to turn water on and off at the spout versus with the faucet handles despite SLP assistance to accommodate for visual impairment.  Pt was able to organize and sequence oral care for brushing his teeth and cleaning his dentures with min assist verbal cues.  Pt sustained his attention to task for ~15 minutes with supervision verbal cues for redirection.  He propelled his wheelchair (per pt preference as he said he was tired from walking during PT) around the unit to get a cup of coffee with supervision cues and was able to recall route to return to room with min verbal cues.  Pt asked for assistance appropriately for route recall.  Pt was left in room with call bell within reach and quick release belt donned.  Continue per current plan of care.    Function:  Eating Eating                 Cognition Comprehension Comprehension assist level: Understands basic 75 - 89% of the time/ requires cueing 10 - 24% of the time  Expression   Expression assist level: Expresses basic 90% of  the time/requires cueing < 10% of the time.  Social Interaction Social Interaction assist level: Interacts appropriately 75 - 89% of the time - Needs redirection for appropriate language or to initiate interaction.  Problem Solving Problem solving assist level: Solves basic 50 - 74% of the time/requires cueing 25 - 49% of the time  Memory Memory assist level: Recognizes or recalls 25 - 49% of the time/requires cueing 50 - 75% of the time    Pain Pain Assessment Pain Assessment: No/denies pain   Therapy/Group: Individual Therapy  Jazminn Pomales, Melanee SpryNicole L 12/01/2015, 4:41 PM

## 2015-12-01 NOTE — Progress Notes (Signed)
David Duke is a 81 y.o. male [birth year] [id number]  Subjective: Napping but easily aroused - denies problems currently, but recurrent afternoon HA - wife at side  Objective: Vital signs in last 24 hours: Temp:  [97.5 F (36.4 C)-98.6 F (37 C)] 97.5 F (36.4 C) (03/25 0710) Pulse Rate:  [77-81] 77 (03/25 0710) Resp:  [18] 18 (03/25 0710) BP: (127-154)/(74-86) 154/86 mmHg (03/25 0710) SpO2:  [97 %] 97 % (03/25 0710) Weight change:  Last BM Date: 11/29/15  Intake/Output from previous day: 03/24 0701 - 03/25 0700 In: 720 [P.O.:720] Out: 1325 [Urine:1325]  Physical Exam General: No apparent distress   Lying supine in bed, spouse at side in recliner Lungs: Normal effort. Lungs clear to auscultation, no crackles or wheezes. Cardiovascular: Regular rate and rhythm, no edema Neurological: No new neurological deficits   Lab Results: BMET    Component Value Date/Time   NA 141 11/29/2015 0441   K 3.5 11/29/2015 0441   CL 106 11/29/2015 0441   CO2 26 11/29/2015 0441   GLUCOSE 108* 11/29/2015 0441   BUN 10 11/29/2015 0441   CREATININE 1.32* 11/29/2015 0441   CALCIUM 8.7* 11/29/2015 0441   GFRNONAA 50* 11/29/2015 0441   GFRAA 58* 11/29/2015 0441   CBC    Component Value Date/Time   WBC 10.2 11/29/2015 0441   RBC 4.02* 11/29/2015 0441   HGB 12.2* 11/29/2015 0441   HCT 35.9* 11/29/2015 0441   HCT 41.1 11/25/2015 1543   PLT 214 11/29/2015 0441   MCV 89.3 11/29/2015 0441   MCH 30.3 11/29/2015 0441   MCHC 34.0 11/29/2015 0441   RDW 13.2 11/29/2015 0441   LYMPHSABS 1.5 11/29/2015 0441   MONOABS 0.8 11/29/2015 0441   EOSABS 0.2 11/29/2015 0441   BASOSABS 0.0 11/29/2015 0441   CBG's (last 3):   Recent Labs  11/28/15 1129 11/28/15 1645  GLUCAP 99 89   LFT's Lab Results  Component Value Date   ALT 20 11/29/2015   AST 24 11/29/2015   ALKPHOS 59 11/29/2015   BILITOT 0.6 11/29/2015    Studies/Results: No results found.  Medications:  I have reviewed the  patient's current medications. Scheduled Medications: . amoxicillin-clavulanate  1 tablet Oral BID  . atorvastatin  20 mg Oral q1800  . calcium-vitamin D  1 tablet Oral BID  . diazepam  5 mg Oral BID  . fluticasone  1 spray Each Nare Daily  . loratadine  10 mg Oral Daily  . nystatin   Topical TID  . pantoprazole  40 mg Oral Daily  . saccharomyces boulardii  250 mg Oral BID  . senna  1 tablet Oral BID WC  . sertraline  100 mg Oral Daily  . silodosin  4 mg Oral Q2000  . 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 Balance deficits and poor cognition secondary  to left ICH. Continue CIR  2. DVT Prophylaxis/Anticoagulation: Mechanical: Sequential compression devices, below knee Bilateral lower extremities 3. Pain Management: Tylenol prn for pain 4. Mood: LCSW to follow for evaluation and support.  5. Neuropsych: This patient is not fully capable of making decisions on his own behalf. 6. Skin/Wound Care: routine pressure relief measures. 7. Fluids/Electrolytes/Nutrition: Monitor I/O.  BMP improving on 3/23 8. H/o BPH/Urinary retention:   Foley dc'd, but replaced 3/24 due to retention UA with rare bacteria on 3/23, urine culture pending (NGTD)  Changed flomax to rapaflo adjusted for kidney function.  9. Depression/PTSD/anxiety: On Zoloft daily with valium prn.  10. COPD: Uses albuterol daily due to "tightness' Also has issues with congestion. Chest x-ray reviewed from 3/22, relatively unremarkable Changed schedule nebs to when necessary on 3/23.  11. Headaches: Gets one "every afternoon".  Fioricet changed to Topamax 25 mg on 3/23.  Added Claritin and Flonase to help with nasal/sinus congestion.  12. Sundowning: wife reporting intermittent confusion worse in the evenings.  Continue to reorient and adjust sleep-wake cycle.  13. Prediabetes: Periodically monitor 14. AKI: continue to monitor. Avoid nephrotoxic meds Creatinine 1.32 on 3/23 (improving)  15: Hypokalemia: Will continue to monitor  3.5 on 3/23 Will continue to monitor  16. Sinusitis Augmentin started 3/24-3/31  Length of stay, days: 3   Valerie A. Felicity CoyerLeschber, MD 12/01/2015, 10:55 AM

## 2015-12-01 NOTE — Progress Notes (Signed)
Occupational Therapy Session Note  Patient Details  Name: Marcell BarlowDavid R Nanez MRN: [id number] Date of Birth: 01/01/1935  Today's Date: 12/01/2015 OT Individual Time: 6295-28411540-1625 OT Individual Time Calculation (min): 45 min   Skilled Therapeutic Interventions/Progress Updates: As in earilier session today, patient talked extensively about the pain and discomfort attempts to don a different cathether last night.   Otherwise, he participated in endurance activities wheel chair level and required cues to stay on task and recall 1 to 2 step instructions for the tasks.  At end of session, patient complained of fatigue and was assisted back to bed and with alarm and call bell in place.     Therapy Documentation Precautions:  Precautions Precautions: Fall Restrictions Weight Bearing Restrictions: No  Pain:8/10 - RN brought meds  See Function Navigator for Current Functional Status.   Therapy/Group: Individual Therapy  Bud Faceickett, Latanga Nedrow [facility] 12/01/2015, 6:41 PM

## 2015-12-01 NOTE — Progress Notes (Signed)
Physical Therapy Session Note  Patient Details  Name: David Duke MRN: [id number] Date of Birth: 07/17/1935  Today's Date: 12/01/2015 PT Individual Time: 0902-1001 PT Individual Time Calculation (min): 59 min   Short Term Goals: Week 1:    STG = LTG due to short LOS  Skilled Therapeutic Interventions/Progress Updates:    Patient received supine in Bed and agreeable to PT. Patient reports that his groin is sore from catheter replacement. Patient performed bed<>WC transfer with RW with cues for AD management. PT [facility]. PT placed TED hose on patient. WC mobility to solarium for UE endurance and strengthening. Gait training with WC in solarium and on patio for 12250ft, Gait training without RW 14200ft x 2, 2 near LOB with minA from PT to correct. Gait to elevator from solarium without AD, 18665ft with head nods and turns, PT required to prevent fall from LOB x 2. Standing on airex with normal BOS x 3 minutes supervision A. Standing on airex pad for 2 minutes with narrow BOS with min A to prevent LOB. Patient performed gait training without AD to room for 150 ft with Min A from PT. Patient left sitting in WC with quick release belt and call bell within reach.     Therapy Documentation Precautions:  Precautions Precautions: Fall Restrictions Weight Bearing Restrictions: No General:   Vital Signs:    See Function Navigator for Current Functional Status.   Therapy/Group: Individual Therapy  David Duke 12/01/2015, 12:57 PM

## 2015-12-02 ENCOUNTER — Inpatient Hospital Stay (HOSPITAL_COMMUNITY): Payer: Medicare Other | Admitting: Physical Therapy

## 2015-12-02 DIAGNOSIS — J0110 Acute frontal sinusitis, unspecified: Secondary | ICD-10-CM

## 2015-12-02 NOTE — Progress Notes (Addendum)
Physical Therapy Session Note  Patient Details  Name: David BarlowDavid R Duke MRN: [id number] Date of Birth: 07/04/1935  Today's Date: 12/02/2015 PT Individual Time: 1016-1100 PT Individual Time Calculation (min): 44 min   Skilled Therapeutic Interventions/Progress Updates:    Pt received in bed, wife present, & agreeable to PT. Pt reported 8-9/10 sinus head pain; RN notified & administered pain medication. Pt also very frustrated with catheter & pain during re-catheterization on previous date. PT provided therapeutic listening & then attempted to redirect pt, but pt very distracted throughout session. Pt received in bed with all 4 bed rails up; PT educated pt & wife on need for only 3 bed rails up as 4 is considered a restraint. Pt transferred supine>sit with use of bed features and S. PT assisted pt with threading pants on BLE & catheter bag through pants. Pt able to stand & complete donning pants with steady A for balance. Throughout entire treatment pt required frequent verbal cuing for hand placement on stable surface during sit<>stand transfers; pt with poor carryover of this. Pt ambulated from room>gym ~100 ft with RW & steady A, with intermittent decreased L foot clearance, potentially due to decreased L hip & knee flexion. Pt able to self correct gait pattern with verbal cuing from PT. Pt required seated rest break 2/2 SOB but SpO2 = 100%; PT adjusted pt's RW to proper height. Pt then ambulated from gym>room ~140 ft with RW & Steady A. On several occasions pt let go of RW during gait & became unsteady, PT educated pt on need to maintain hold on RW during ambulation but pt with poor carryover of this. At end of session pt left in w/c with QRB in place & all needs within reach.   Therapy Documentation Precautions:  Precautions Precautions: Fall Restrictions Weight Bearing Restrictions: No Pain: Pain Assessment Pain Assessment: 0-10 Pain Score: 8  Pain Type: Acute pain Pain Location: Head Pain  Descriptors / Indicators:  (sinus) Pain Intervention(s): RN made aware;Ambulation/increased activity;Repositioned   See Function Navigator for Current Functional Status.   Therapy/Group: Individual Therapy  Sandi MariscalVictoria M Abed Schar 12/02/2015, 11:19 AM

## 2015-12-02 NOTE — Progress Notes (Signed)
David BarlowDavid R Duke is a 81 y.o. male 05/01/1935 [id number]  Subjective: Awake in bed, complains of poor sleep due to disruptions; denies problems currently, but disappointed in foley reinsertion 3/24 - wife at side  Objective: Vital signs in last 24 hours: Temp:  [97.6 F (36.4 C)-98.6 F (37 C)] 98.6 F (37 C) (03/26 0550) Pulse Rate:  [75-86] 86 (03/26 0550) Resp:  [17-18] 17 (03/26 0550) BP: (135-147)/(75-83) 147/83 mmHg (03/26 0550) SpO2:  [97 %-99 %] 97 % (03/26 0550) Weight change:  Last BM Date: 11/29/15  Intake/Output from previous day: 03/25 0701 - 03/26 0700 In: 480 [P.O.:480] Out: 250 [Urine:250]  Physical Exam General: No apparent distress  Sitting upright in bed, spouse at side in recliner Lungs: Normal effort. Lungs clear to auscultation, no crackles or wheezes. Cardiovascular: Regular rate and rhythm, no edema Neurological: No new neurological deficits   Lab Results: BMET    Component Value Date/Time   NA 141 11/29/2015 0441   K 3.5 11/29/2015 0441   CL 106 11/29/2015 0441   CO2 26 11/29/2015 0441   GLUCOSE 108* 11/29/2015 0441   BUN 10 11/29/2015 0441   CREATININE 1.32* 11/29/2015 0441   CALCIUM 8.7* 11/29/2015 0441   GFRNONAA 50* 11/29/2015 0441   GFRAA 58* 11/29/2015 0441   CBC    Component Value Date/Time   WBC 10.2 11/29/2015 0441   RBC 4.02* 11/29/2015 0441   HGB 12.2* 11/29/2015 0441   HCT 35.9* 11/29/2015 0441   HCT 41.1 11/25/2015 1543   PLT 214 11/29/2015 0441   MCV 89.3 11/29/2015 0441   MCH 30.3 11/29/2015 0441   MCHC 34.0 11/29/2015 0441   RDW 13.2 11/29/2015 0441   LYMPHSABS 1.5 11/29/2015 0441   MONOABS 0.8 11/29/2015 0441   EOSABS 0.2 11/29/2015 0441   BASOSABS 0.0 11/29/2015 0441   CBG's (last 3):  No results for input(s): GLUCAP in the last 72 hours. LFT's Lab Results  Component Value Date   ALT 20 11/29/2015   AST 24 11/29/2015   ALKPHOS 59 11/29/2015   BILITOT 0.6 11/29/2015    Studies/Results: No results  found.  Medications:  I have reviewed the patient's current medications. Scheduled Medications: . amoxicillin-clavulanate  1 tablet Oral BID  . atorvastatin  20 mg Oral q1800  . calcium-vitamin D  1 tablet Oral BID  . diazepam  5 mg Oral BID  . fluticasone  1 spray Each Nare Daily  . loratadine  10 mg Oral Daily  . nystatin   Topical TID  . pantoprazole  40 mg Oral Daily  . saccharomyces boulardii  250 mg Oral BID  . senna  1 tablet Oral BID WC  . sertraline  100 mg Oral Daily  . silodosin  4 mg Oral Q2000  . 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 Balance deficits and  poor cognition secondary to left ICH. Continue CIR  2. DVT Prophylaxis/Anticoagulation: Mechanical: Sequential compression devices, below knee Bilateral lower extremities 3. Pain Management: Tylenol prn for pain 4. Mood: LCSW to follow for evaluation and support.  5. Neuropsych: This patient is not fully capable of making decisions on his own behalf. 6. Skin/Wound Care: routine pressure relief measures. 7. Fluids/Electrolytes/Nutrition: Monitor I/O.  BMP improving  on 3/23 8. H/o BPH/Urinary retention:  Foley dc'd, but replaced 3/24 due to retention UA with rare bacteria on 3/23, urine culture: No sig growth (final)  Changed flomax to rapaflo adjusted for kidney function.  9. Depression/PTSD/anxiety: On Zoloft daily with valium prn.  10. COPD: Uses albuterol daily due to "tightness' Also has issues with congestion. Chest x-ray reviewed from 3/22, relatively unremarkable Changed schedule nebs to when necessary on 3/23.  11. Headaches: Gets one "every afternoon".  Fioricet changed to Topamax 25 mg on 3/23.  Added Claritin and Flonase to help with nasal/sinus congestion.  12. Sundowning: wife reporting intermittent confusion worse in the evenings.  Continue to reorient and adjust sleep-wake cycle.  13. Prediabetes: Periodically monitor 14. AKI: continue to monitor. Avoid nephrotoxic meds Creatinine 1.32 on 3/23 (improving)  15: Hypokalemia:  3.5 on 3/23 Will continue to monitor  16. Sinusitis Augmentin started 3/24-3/31  Length of stay, days: 4   Blimie Vaness A. Felicity Coyer, MD 12/02/2015, 10:16 AM

## 2015-12-03 ENCOUNTER — Encounter (HOSPITAL_COMMUNITY): Payer: Medicare Other

## 2015-12-03 ENCOUNTER — Inpatient Hospital Stay (HOSPITAL_COMMUNITY): Payer: Medicare Other | Admitting: Speech Pathology

## 2015-12-03 ENCOUNTER — Inpatient Hospital Stay (HOSPITAL_COMMUNITY): Payer: Medicare Other | Admitting: Physical Therapy

## 2015-12-03 ENCOUNTER — Inpatient Hospital Stay (HOSPITAL_COMMUNITY): Payer: Medicare Other

## 2015-12-03 DIAGNOSIS — G44229 Chronic tension-type headache, not intractable: Secondary | ICD-10-CM | POA: Diagnosis present

## 2015-12-03 LAB — BASIC METABOLIC PANEL
ANION GAP: 11 (ref 5–15)
BUN: 24 mg/dL — ABNORMAL HIGH (ref 6–20)
CO2: 21 mmol/L — ABNORMAL LOW (ref 22–32)
Calcium: 9.2 mg/dL (ref 8.9–10.3)
Chloride: 106 mmol/L (ref 101–111)
Creatinine, Ser: 1.56 mg/dL — ABNORMAL HIGH (ref 0.61–1.24)
GFR calc Af Amer: 47 mL/min — ABNORMAL LOW (ref >60)
GFR calc non Af Amer: 41 mL/min — ABNORMAL LOW (ref >60)
GLUCOSE: 107 mg/dL — AB (ref 65–99)
POTASSIUM: 3.9 mmol/L (ref 3.5–5.1)
Sodium: 138 mmol/L (ref 135–145)

## 2015-12-03 MED ORDER — LIDOCAINE HCL 2 % EX GEL
1.0000 "application " | CUTANEOUS | Status: DC | PRN
  Filled 2015-12-03: qty 5

## 2015-12-03 MED ORDER — TOPIRAMATE 25 MG PO TABS
50.0000 mg | ORAL_TABLET | Freq: Two times a day (BID) | ORAL | Status: DC
  Administered 2015-12-03 – 2015-12-11 (×16): 50 mg via ORAL
  Filled 2015-12-03 (×17): qty 2

## 2015-12-03 MED ORDER — BETHANECHOL CHLORIDE 10 MG PO TABS
10.0000 mg | ORAL_TABLET | Freq: Three times a day (TID) | ORAL | Status: DC
  Administered 2015-12-03 – 2015-12-04 (×4): 10 mg via ORAL
  Filled 2015-12-03 (×4): qty 1

## 2015-12-03 NOTE — Progress Notes (Signed)
Patient bladder scan for 257, attempted to void, I&O cath unsuccessful, on call PA aware and orders changed for coude I&O cath with lidocaine jelly as needed.  Will continue to monitor patient.

## 2015-12-03 NOTE — Progress Notes (Addendum)
Charge RN attempted with this RN coude I&O cath; unsuccessful due to patient son urging staff to stop due to patient discomfort.  On call PA made aware of situation; PA advised no more cath attempts at this time unless patient in severe discomfort from full bladder and to reassess in AM with rounding MD.  Both this RN and charge RN discussed with patient and son importance of draining urine from bladder as foley was d/c'd earlier today. Will continue to monitor patient.    Patient wife called unit to talk to RN about catheter situation and discussed concerns. Questions answered and support given. Will continue to monitor patient.

## 2015-12-03 NOTE — Progress Notes (Addendum)
[facility]     PROGRESS NOTE  Subjective/Complaints:  Patient sitting up in bed about to work with physical therapy this morning. He is tangential and slow, but complains of recurrent after headaches, which appear to be improved, but still present  ROS: + Recurrent evening headache. Denies CP, SOB, N/V/D  Objective: Vital Signs: Blood pressure 124/75, pulse 75, temperature 98 F (36.7 C), temperature source Oral, resp. rate 22, height  (1.727 m), weight 63.9 kg (140 lb 14 oz), SpO2 95 %. No results found. No results for input(s): WBC, HGB, HCT, PLT in the last 72 hours.  Recent Labs  12/03/15 0638  NA 138  K 3.9  CL 106  GLUCOSE 107*  BUN 24*  CREATININE 1.56*  CALCIUM 9.2   CBG (last 3)  No results for input(s): GLUCAP in the last 72 hours.  Wt Readings from Last 3 Encounters:  11/28/15 63.9 kg (140 lb 14 oz)  11/22/15 64.1 kg (141 lb 5 oz)  03/31/15 67.586 kg (149 lb)    Physical Exam:  BP 124/75 mmHg  Pulse 75  Temp(Src) 98 F (36.7 C) (Oral)  Resp 22  Ht  (1.727 m)  Wt 63.9 kg (140 lb 14 oz)  BMI 21.42 kg/m2  SpO2 95% Constitutional: He appears well-developed and well-nourished. NAD.  HENT: Normocephalic and atraumatic.  Eyes: Conjunctivae and EOM are normal.  Cardiovascular: Normal rate and regular rhythm.No murmur heard. Respiratory: Effort normal. No respiratory distress.  GI: Soft. Bowel sounds are normal. He exhibits no distension. There is no tenderness.  Musculoskeletal: He exhibits no edema or tenderness.  Neurological: He is alert.  Has poor safety awareness.  Mild ataxia with BUE.  A&Ox2 Motor: B/l UE: 5/5 proximal to distal B/l LE 5/5 proximal to distal  Skin: Skin is warm and dry. No rash noted. No erythema.  Psychiatric: His speech is tangential. Thought process is slowed. Cognition and memory are impaired.     Assessment/Plan: 1. Functional deficits secondary to left ICH which require 3+  hours per day of interdisciplinary therapy in a comprehensive inpatient rehab setting. Physiatrist is providing close team supervision and 24 hour management of active medical problems listed below. Physiatrist and rehab team continue to assess barriers to discharge/monitor patient progress toward functional and medical goals.  Function:  Bathing Bathing position Bathing activity did not occur:  (he only bathed arm pits, back, & face) Position: Sitting EOB  Bathing parts Body parts bathed by patient: Right arm, Left arm, Chest, Abdomen Body parts bathed by helper: Back  Bathing assist Assist Level: Set up, Supervision or verbal cues      Upper Body Dressing/Undressing Upper body dressing   What is the patient wearing?: Pull over shirt/dress, Button up shirt     Pull over shirt/dress - Perfomed by patient: Thread/unthread right sleeve, Thread/unthread left sleeve, Put head through opening, Pull shirt over trunk   Button up shirt - Perfomed by patient: Pull shirt around back, Thread/unthread left sleeve (he elected not to button his shirt)      Upper body assist Assist Level: Touching or steadying assistance(Pt > 75%)   Set up : To obtain clothing/put away  Lower Body Dressing/Undressing Lower body dressing Lower body dressing/undressing activity did not occur: Refused What is the patient wearing?: Underwear, Pants, Non-skid slipper socks Underwear - Performed by patient: Thread/unthread right underwear leg, Thread/unthread left underwear leg, Pull underwear up/down   Pants- Performed by patient: Thread/unthread right pants leg,  Thread/unthread left pants leg, Pull pants up/down, Fasten/unfasten pants   Non-skid slipper socks- Performed by patient: Don/doff right sock, Don/doff left sock                    Lower body assist Assist for lower body dressing: Touching or steadying assistance (Pt > 75%)      Toileting Toileting Toileting activity did not occur: N/A Toileting  steps completed by patient: Adjust clothing prior to toileting, Performs perineal hygiene, Adjust clothing after toileting   Toileting Assistive Devices: Grab bar or rail  Toileting assist Assist level: Supervision or verbal cues   Transfers Chair/bed transfer   Chair/bed transfer method: Stand pivot Chair/bed transfer assist level: Touching or steadying assistance (Pt > 75%) Chair/bed transfer assistive device: Patent attorneyWalker     Locomotion Ambulation     Max distance: 140 ft  Assist level: Touching or steadying assistance (Pt > 75%)   Wheelchair Wheelchair activity did not occur: N/A Type: Manual Max wheelchair distance: 28600ft Assist Level: Touching or steadying assistance (Pt > 75%)  Cognition Comprehension Comprehension assist level: Understands basic 75 - 89% of the time/ requires cueing 10 - 24% of the time  Expression Expression assist level: Expresses basic 90% of the time/requires cueing < 10% of the time.  Social Interaction Social Interaction assist level: Interacts appropriately 75 - 89% of the time - Needs redirection for appropriate language or to initiate interaction.  Problem Solving Problem solving assist level: Solves basic 50 - 74% of the time/requires cueing 25 - 49% of the time  Memory Memory assist level: Recognizes or recalls 25 - 49% of the time/requires cueing 50 - 75% of the time    Medical Problem List and Plan: 1. Balance deficits and poor cognition secondary to left ICH.  Continue CIR  2. DVT Prophylaxis/Anticoagulation: Mechanical: Sequential compression devices, below knee Bilateral lower extremities 3. Pain Management: Tylenol prn for pain 4. Mood: LCSW to follow for evaluation and support.  5. Neuropsych: This patient is not fully capable of making decisions on his own behalf. 6. Skin/Wound Care: routine pressure relief measures. 7. Fluids/Electrolytes/Nutrition: Monitor I/O.  8. H/o BPH/Urinary retention:    UA with rare bacteria on 3/23, urine  culture with insignificant growth   Will DC Foley again  Changed flomax to rapaflo adjusted for kidney function.   Bethanechol 10 added on 3/27 9. Depression/PTSD/anxiety: On Zoloft daily with valium prn.  10. COPD: Uses albuterol daily due to "tightness' Also has issues with congestion.  Chest x-ray reviewed from 3/22, relatively unremarkable  Changed schedule nebs to when necessary on 3/23.  11. Headaches: Gets one "every afternoon".   Fioricet changed to Topamax 25 mg on 3/23, increased to 50 on 3/27.   Added Claritin and Flonase to help with nasal/sinus congestion.  12. Sundowning: wife reporting intermittent confusion worse in the evenings.   Continue to reorient and adjust sleep-wake cycle.  13. Prediabetes: Periodically monitor 14. AKI: continue to monitor. Avoid nephrotoxic meds  Creatinine 1.56  On 3/27  Encourage oral fluids 15: Hypokalemia: Resolved  3.9 on 3/27  Will continue to monitor  16. Sinusitis  Augmentin started 3/24-3/31  LOS (Days) 5 A FACE TO FACE EVALUATION WAS PERFORMED  Ankit Karis Jubanil Patel 12/03/2015 9:20 AM

## 2015-12-03 NOTE — Progress Notes (Signed)
Speech Language Pathology Daily Session Note  Patient Details  Name: David BarlowDavid R Wandell MRN: [id number] Date of Birth: 04/09/1935  Today's Date: 12/03/2015 SLP Individual Time: 1430-1530 SLP Individual Time Calculation (min): 60 min  Short Term Goals: Week 1: SLP Short Term Goal 1 (Week 1): Pt to consistently demonstrate O x 4 with min A.  SLP Short Term Goal 2 (Week 1): Pt to demonstrate recall of daily activities with mod A of questioning cues.  SLP Short Term Goal 3 (Week 1): Pt to demonstrate ability to manage meds with max A.  SLP Short Term Goal 4 (Week 1): Pt to demonstrate ability sustain attention to task for 5 min with mod A.   Skilled Therapeutic Interventions:  Pt was seen for skilled ST targeting cognitive goals.  Upon arrival, pt requested to use the bathroom, foley removed today.  Pt attempted to void but without results.  When ambulating to ST treatment room, pt recalled and demonstrated safe walker usage with supervision verbal cues.  SLP facilitated the session with a novel card game targeting basic functional problem solving and sustained attention to task.  Pt required min verbal cues for working memory for task rules and procedures and was able to sustain his attention to task for ~10 minutes with supervision verbal cues for redirection.  Pt was mod I for wayfinding around the unit back to his room despite taking an alternate route.  Pt was left in bed with bed alarm set.  Pt returned demonstration for use of call bell with mod I.  Continue per current plan of care.     Function:  Eating Eating                 Cognition Comprehension Comprehension assist level: Understands basic 75 - 89% of the time/ requires cueing 10 - 24% of the time  Expression   Expression assist level: Expresses basic 90% of the time/requires cueing < 10% of the time.  Social Interaction Social Interaction assist level: Interacts appropriately 90% of the time - Needs monitoring or  encouragement for participation or interaction.  Problem Solving Problem solving assist level: Solves basic 50 - 74% of the time/requires cueing 25 - 49% of the time  Memory Memory assist level: Recognizes or recalls 50 - 74% of the time/requires cueing 25 - 49% of the time    Pain Pain Assessment Pain Assessment: No/denies pain  Therapy/Group: Individual Therapy  Rayven Rettig, Melanee SpryNicole L 12/03/2015, 4:00 PM

## 2015-12-03 NOTE — Progress Notes (Signed)
Social Work Patient ID: David Duke, male   DOB: 04/15/1935, 81 y.o.   MRN: [id number] Have left message for wife to be able to complete assessment. Pt is unable to provide the information needed, somewhat confused. Will await wife's return call.

## 2015-12-03 NOTE — Progress Notes (Signed)
Physical Therapy Session Note  Patient Details  Name: David Duke MRN: [id number] Date of Birth: 07/09/1935  Today's Date: 12/03/2015 PT Individual Time: [id number] PT Individual Time Calculation (min): 60 min   Short Term Goals: Week 1:   STG= LTG due to short ELOS.  Skilled Therapeutic Interventions/Progress Updates:    Pt received in bed & agreeable to PT, denying c/o pain. Pt assisted with threading catheter through pant leg but pt able to don pants himself. Pt completed donning pants in standing with supervision for standing balance without BUE support. Pt ambulated room>rehab apartment with RW & supervision, requiring cuing to maintain hold of RW at all times while ambulating & to stay in base of RW with improving carryover. Pt able to demonstrate bed mobility with Mod I without hospital bed features in rehab apartment. Pt negotiated 4 steps x 3 consecutive trials with B railings, supervision, & reciprocal pattern. Pt easily distracted throughout session requiring maximum cuing for redirection. Pt's standing balance was challenged by standing on airex foam with narrow BOS, without BUE support, & reaching outside of BOS to obtain & toss horseshoes. Pt ambulated without AD & retrieved objects from floor with close supervision; pt required seated rest break 2/2 fatigue & SOB; SpO2 = 98%. Throughout session pt required maximum cuing for proper hand placement on stable surface during sit<>stand transfers; pt beginning to correct himself with subtle cuing from PT. At end of session pt ambulated ~250 ft with RW & supervision, letting go of RW 1 time during gait but able to recognize error. At end of session pt left in w/c with QRB in place & all needs within reach.   Therapy Documentation Precautions:  Precautions Precautions: Fall Restrictions Weight Bearing Restrictions: No  Pain: Pain Assessment Pain Assessment: No/denies pain   See Function Navigator for Current Functional  Status.   Therapy/Group: Individual Therapy  Sandi MariscalVictoria M Laquitha Heslin 12/03/2015, 8:31 AM

## 2015-12-03 NOTE — Progress Notes (Signed)
Occupational Therapy Session Note  Patient Details  Name: David BarlowDavid R Duke MRN: [id number] Date of Birth: 10/04/1934  Today's Date: 12/03/2015 OT Individual Time: 1100-1200 OT Individual Time Calculation (min): 60 min    Short Term Goals: Week 1:  OT Short Term Goal 1 (Week 1): STG=LTG d/t short anticipated LOS  Skilled Therapeutic Interventions/Progress Updates:  ADL-retraining at shower level with focus on dynamic standing balance, safety awareness, and sequencing during transfers.   Pt received supine in bed with spouse present.  After reorientation to situation, pt complied with direction to complete shower and dressing.   Pt ambulated to bathroom using RW and completed transfer to tub bench but required steadying assist to maintain standing balance and instructional cues to manage catheter bag and RW while performing transfer to bench.   Pt doffed clothing standing supported using grab bar, shower chair and RW with standby assist.   No LOB noted while pt stood on one leg to remove pants.  Pt required assist to problem-solve and manage catheter bag.   After bathing pt returned to EOB to dress after rest break.   During activity pt requires redirection and brief directive instructions to proceed through task with improved performance noted with use of provocative humor to advance.      Therapy Documentation Precautions:  Precautions Precautions: Fall Restrictions Weight Bearing Restrictions: No  Pain: No/denies pain   ADL: ADL ADL Comments: see Functional Assessment Tool   See Function Navigator for Current Functional Status.   Therapy/Group: Individual Therapy   Second session: Time: 1330-1400 Time Calculation (min):  30 min  Pain Assessment: No/denies pain  Skilled Therapeutic Interventions: ADL-retraining seated at EOB with focus on grooming (foot care).  Pt's wife brought nail care equipment from home at pt's request prior to session and pt expressed interest in  performing foot care, with assistance from OT if needed.   Pt states that he typically performs his own foot care after soaking his feet in warm water but he believed his new "clippers" (cuticle clippers) would allow improved efficiency with process.   OT offered to observe and assist if needed.  Pt manipulated clippers with left hand while sitting with HOB elevated and legs crossed, partially side lying, to improve reach.   Pt was observed and able to partially trim one toenail but was advised to pursue consultation with podiatrist for routine foot care d/t visual deficit and status of nails (possible fungal growth) at each great toe only.   OT then assisted with foot care sufficient to prevent continued snags and discomfort when pt attempts to don his socks and TEDs.     See FIM for current functional status  Therapy/Group: Individual Therapy  Myia Bergh 12/03/2015, 12:57 PM

## 2015-12-03 NOTE — Care Management Note (Signed)
[facility] Individual Statement of Services  Patient Name:  David BarlowDavid R Bassett  Date:  12/03/2015  Welcome [facility] the [facility].  Our goal is to provide you with an individualized program based on your diagnosis and situation, designed to meet your specific needs.  With this comprehensive rehabilitation program, you will be expected to participate in at least 3 hours of rehabilitation therapies Monday-Friday, with modified therapy programming on the weekends.  Your rehabilitation program will include the following services:  Physical Therapy (PT), Occupational Therapy (OT), Speech Therapy (ST), 24 hour per day rehabilitation nursing, Therapeutic Recreaction (TR), Neuropsychology, Case Management (Social Worker), Rehabilitation Medicine, Nutrition Services and Pharmacy Services  Weekly team conferences will be held on Wednesday to discuss your progress.  Your Social Worker will talk with you frequently to get your input and to update you on team discussions.  Team conferences with you and your family in attendance may also be held.  Expected length of stay: 10-12 days  Overall anticipated outcome: supervision level  Depending on your progress and recovery, your program may change. Your Social Worker will coordinate services and will keep you informed of any changes. Your Social Worker's name and contact numbers are listed  below.  The following services may also be recommended but are not provided by the [facility]:   Driving Evaluations  Home Health Rehabiltiation Services  Outpatient Rehabilitation Services    Arrangements will be made to provide these services after discharge if needed.  Arrangements include referral to agencies that provide these services.  Your insurance has been verified to be:  [employer]Med[employer]are Your primary doctor is:  Lerry LinerDwight Williams  Pertinent information will be shared with your doctor and your insurance  company.  Social Worker:  Dossie DerBecky Ravina Milner, SW [phone] or ([phone]) [phone]  Information discussed with and copy given to patient by: Lucy Chrisupree, Claris Pech G, 12/03/2015, 3:53 PM

## 2015-12-04 ENCOUNTER — Inpatient Hospital Stay (HOSPITAL_COMMUNITY): Payer: Medicare Other

## 2015-12-04 ENCOUNTER — Inpatient Hospital Stay (HOSPITAL_COMMUNITY): Payer: Medicare Other | Admitting: *Deleted

## 2015-12-04 ENCOUNTER — Inpatient Hospital Stay (HOSPITAL_COMMUNITY): Payer: Medicare Other | Admitting: Physical Therapy

## 2015-12-04 ENCOUNTER — Inpatient Hospital Stay (HOSPITAL_COMMUNITY): Payer: Medicare Other | Admitting: Speech Pathology

## 2015-12-04 MED ORDER — SENNA 8.6 MG PO TABS
2.0000 | ORAL_TABLET | Freq: Every day | ORAL | Status: DC
  Administered 2015-12-05 – 2015-12-10 (×5): 17.2 mg via ORAL
  Filled 2015-12-04 (×5): qty 2

## 2015-12-04 MED ORDER — FAMOTIDINE 20 MG PO TABS
20.0000 mg | ORAL_TABLET | Freq: Every day | ORAL | Status: DC
  Administered 2015-12-04 – 2015-12-11 (×8): 20 mg via ORAL
  Filled 2015-12-04 (×8): qty 1

## 2015-12-04 MED ORDER — BETHANECHOL CHLORIDE 25 MG PO TABS: 25.0000 mg | ORAL_TABLET | Freq: Three times a day (TID) | ORAL | Status: DC

## 2015-12-04 NOTE — Progress Notes (Signed)
Occupational Therapy Session Note  Patient Details  Name: David BarlowDavid R Collazos MRN: [id number] Date of Birth: 03/09/1935  Today's Date: 12/04/2015 OT Individual Time: 1100-1200 OT Individual Time Calculation (min): 60 min    Short Term Goals: Week 1:  OT Short Term Goal 1 (Week 1): STG=LTG d/t short anticipated LOS  Skilled Therapeutic Interventions/Progress Updates: ADL-retraining with focus on improved awareness and problem-solving, attention, functional mobility using RW and dynamic standing balance.  Pt received asleep in bed, partially dressed.   Pt required extra time to reorient to task and his need for activity per care plan d/t pt ruminating over perceived traumatic assist provided by caregivers placing new indwelling catheter during evening shift. With son entering room, pt became more alert and engaging and agreed to rise from bed, groom at sink and ambulate through hallway to ADL apartment.   Pt able to rise and ambulate with min guard assist but required mod-max assist to progress through grooming and dressing at sink d/t decreased attention to OT with son present also providing cues.  Pt ambulated approx 200 feet to kitchen and recovered to love seat to rest, allowing OT place new TEDs.   Pt returned to his room and remained in w/c to await lunch.  QRB applied and call placed within reach.     Therapy Documentation Precautions:  Precautions Precautions: Fall Restrictions Weight Bearing Restrictions: No  Pain: Pain Assessment Pain Assessment: No/denies pain  ADL: ADL ADL Comments: see Functional Assessment Tool  See Function Navigator for Current Functional Status.   Therapy/Group: Individual Therapy  Broedy Osbourne 12/04/2015, 2:39 PM

## 2015-12-04 NOTE — Progress Notes (Signed)
Physical Therapy Session Note  Patient Details  Name: David Duke MRN: [id number] Date of Birth: 03/27/1935  Today's Date: 12/04/2015 PT Individual Time: [id number] PT Individual Time Calculation (min): 71 min   Short Term Goals: Week 1:   STG = LTG due to short ELOS.  Skilled Therapeutic Interventions/Progress Updates:    Pt received in bed, reporting 9/10 pain in legs, hips, shoulders, head & back of neck; PT notified nurse. Pt transferred out of bed with supervision with use of bed features & required assistance to don shoes correctly. Pt ambulated from room>outside (>300 ft) with RW & supervision. Pt able to negotiate over even & uneven surfaces with RW and without LOB. PT provided cuing for pt to ambulate in base of RW & to look up instead of at floor; pt with fair/poor carryover requiring continued cuing. Pt performed car transfer with seat height set at 20 inches with RW, questioning & minimal cuing for management of catheter but with overall supervision for task. Throughout session pt required subtle cuing for proper hand placement as he would often forget to hold on to stable surface instead of RW before transferring. Pt negotiated 4 steps x 2 consecutive trials without railings; pt able to ascend stairs with supervision but required CGA/min A for descending stairs 2/2 multiple losses of balance & unsteadiness even with cuing for LE sequencing. Pt reported dizziness during stair activity; BP = 109/74 mmHg. Pt ambulated back to room & transferred into bed with supervision without bed features. Pt completed 12 reps of bridging with 3 second hold at top and cuing for increased hip extension during task. At end of session pt left in bed with all needs within reach.   Therapy Documentation Precautions:  Precautions Precautions: Fall Restrictions Weight Bearing Restrictions: No   Pain: Pain Assessment Pain Assessment: 0-10 Pain Score: 9  Pain Location:  (legs, hips, shoulders, head,  back of neck) Pain Descriptors / Indicators:  (did not describe) Pain Intervention(s): RN made aware;Ambulation/increased activity;Repositioned   See Function Navigator for Current Functional Status.   Therapy/Group: Individual Therapy  David Duke 12/04/2015, 3:09 PM

## 2015-12-04 NOTE — Progress Notes (Signed)
[facility]     PROGRESS NOTE  Subjective/Complaints:  Patient seen sitting up in bed this morning. He has some issues with being cathed overnight. He states that he needs to urinate, but is unable to.  ROS: + Urinary retention. Denies CP, SOB, N/V/D  Objective: Vital Signs: Blood pressure 105/61, pulse 77, temperature 97.9 F (36.6 C), temperature source Oral, resp. rate 19, height  (1.727 m), weight 63.9 kg (140 lb 14 oz), SpO2 98 %. No results found. No results for input(s): WBC, HGB, HCT, PLT in the last 72 hours.  Recent Labs  12/03/15 0638  NA 138  K 3.9  CL 106  GLUCOSE 107*  BUN 24*  CREATININE 1.56*  CALCIUM 9.2   CBG (last 3)  No results for input(s): GLUCAP in the last 72 hours.  Wt Readings from Last 3 Encounters:  11/28/15 63.9 kg (140 lb 14 oz)  11/22/15 64.1 kg (141 lb 5 oz)  03/31/15 67.586 kg (149 lb)    Physical Exam:  BP 105/61 mmHg  Pulse 77  Temp(Src) 97.9 F (36.6 C) (Oral)  Resp 19  Ht  (1.727 m)  Wt 63.9 kg (140 lb 14 oz)  BMI 21.42 kg/m2  SpO2 98% Constitutional: He appears well-developed and well-nourished. NAD.  HENT: Normocephalic and atraumatic.  Eyes: Conjunctivae and EOM are normal.  Cardiovascular: Normal rate and regular rhythm.No murmur heard. Respiratory: Effort normal. No respiratory distress.  GI: Soft. Bowel sounds are normal. He exhibits no distension. There is no tenderness.  Musculoskeletal: He exhibits no edema or tenderness.  Neurological: He is alert.  Has poor safety awareness.  Mild ataxia with BUE.  A&Ox2 Motor: B/l UE: 5/5 proximal to distal B/l LE 5/5 proximal to distal  Skin: Skin is warm and dry. No rash noted. No erythema.  Psychiatric: His speech is tangential. Thought process is slowed. Cognition and memory are impaired.     Assessment/Plan: 1. Functional deficits secondary to left ICH which require 3+ hours per day of interdisciplinary therapy in a  comprehensive inpatient rehab setting. Physiatrist is providing close team supervision and 24 hour management of active medical problems listed below. Physiatrist and rehab team continue to assess barriers to discharge/monitor patient progress toward functional and medical goals.  Function:  Bathing Bathing position Bathing activity did not occur:  (he only bathed arm pits, back, & face) Position: Sitting EOB  Bathing parts Body parts bathed by patient: Right arm, Left arm, Chest, Abdomen Body parts bathed by helper: Back  Bathing assist Assist Level: Set up, Supervision or verbal cues      Upper Body Dressing/Undressing Upper body dressing   What is the patient wearing?: Pull over shirt/dress, Button up shirt     Pull over shirt/dress - Perfomed by patient: Thread/unthread right sleeve, Thread/unthread left sleeve, Put head through opening, Pull shirt over trunk   Button up shirt - Perfomed by patient: Pull shirt around back, Thread/unthread left sleeve (he elected not to button his shirt)      Upper body assist Assist Level: Touching or steadying assistance(Pt > 75%)   Set up : To obtain clothing/put away  Lower Body Dressing/Undressing Lower body dressing Lower body dressing/undressing activity did not occur: Refused What is the patient wearing?: Underwear, Pants, Non-skid slipper socks Underwear - Performed by patient: Thread/unthread right underwear leg, Thread/unthread left underwear leg, Pull underwear up/down   Pants- Performed by patient: Thread/unthread right pants leg, Thread/unthread left pants leg, Pull pants  up/down, Fasten/unfasten pants   Non-skid slipper socks- Performed by patient: Don/doff right sock, Don/doff left sock                    Lower body assist Assist for lower body dressing: Touching or steadying assistance (Pt > 75%)      Toileting Toileting Toileting activity did not occur: N/A Toileting steps completed by patient: Adjust clothing  prior to toileting, Adjust clothing after toileting Toileting steps completed by helper: Performs perineal hygiene Toileting Assistive Devices: Grab bar or rail  Toileting assist Assist level: More than reasonable time, Supervision or verbal cues   Transfers Chair/bed transfer   Chair/bed transfer method: Stand pivot Chair/bed transfer assist level: Supervision or verbal cues Chair/bed transfer assistive device: Patent attorneyWalker     Locomotion Ambulation     Max distance: 250 Assist level: Supervision or verbal cues   Wheelchair Wheelchair activity did not occur: N/A Type: Manual Max wheelchair distance: [id number] Assist Level: Touching or steadying assistance (Pt > 75%)  Cognition Comprehension Comprehension assist level: Understands basic 75 - 89% of the time/ requires cueing 10 - 24% of the time  Expression Expression assist level: Expresses basic 90% of the time/requires cueing < 10% of the time.  Social Interaction Social Interaction assist level: Interacts appropriately 90% of the time - Needs monitoring or encouragement for participation or interaction.  Problem Solving Problem solving assist level: Solves basic 50 - 74% of the time/requires cueing 25 - 49% of the time  Memory Memory assist level: Recognizes or recalls 50 - 74% of the time/requires cueing 25 - 49% of the time    Medical Problem List and Plan: 1. Balance deficits and poor cognition secondary to left ICH.  Continue CIR  2. DVT Prophylaxis/Anticoagulation: Mechanical: Sequential compression devices, below knee Bilateral lower extremities 3. Pain Management: Tylenol prn for pain 4. Mood: LCSW to follow for evaluation and support.  5. Neuropsych: This patient is not fully capable of making decisions on his own behalf. 6. Skin/Wound Care: routine pressure relief measures. 7. Fluids/Electrolytes/Nutrition: Monitor I/O.  8. H/o BPH/Urinary retention:    UA with rare bacteria on 3/23, urine culture with insignificant  growth   Changed flomax to rapaflo adjusted for kidney function.   Bethanechol 10 added on 3/27, Will increase to 25 on 3/28 9. Depression/PTSD/anxiety: On Zoloft daily with valium prn.  10. COPD: Uses albuterol daily due to "tightness' Also has issues with congestion.  Chest x-ray reviewed from 3/22, relatively unremarkable  Changed schedule nebs to when necessary on 3/23.  11. Headaches: Gets one "every afternoon".   Fioricet changed to Topamax 25 mg on 3/23, increased to 50 on 3/27.   Added Claritin and Flonase to help with nasal/sinus congestion.  12. Sundowning: wife reporting intermittent confusion worse in the evenings.   Continue to reorient and adjust sleep-wake cycle.  13. Prediabetes: Periodically monitor 14. AKI: continue to monitor. Avoid nephrotoxic meds  Creatinine 1.56  On 3/27  Encourage oral fluids 15: Hypokalemia: Resolved  3.9 on 3/27  Will continue to monitor  16. Sinusitis  Augmentin started 3/24-3/31  LOS (Days) 6 A FACE TO FACE EVALUATION WAS PERFORMED  Aubriella Perezgarcia Karis Jubanil Mekhia Brogan 12/04/2015 9:26 AM

## 2015-12-04 NOTE — Progress Notes (Signed)
Updated MD on patients bladder situation during night; no new orders received at this time. MD to talk with patient and family and will follow up with day shift RN.

## 2015-12-04 NOTE — Progress Notes (Signed)
Speech Language Pathology Daily Session Note  Patient Details  Name: David Duke MRN: [id number] Date of Birth: 06/21/1935  Today's Date: 12/04/2015 SLP Individual Time: 0920-1000 SLP Individual Time Calculation (min): 40 min  Short Term Goals: Week 1: SLP Short Term Goal 1 (Week 1): Pt to consistently demonstrate O x 4 with min A.  SLP Short Term Goal 2 (Week 1): Pt to demonstrate recall of daily activities with mod A of questioning cues.  SLP Short Term Goal 3 (Week 1): Pt to demonstrate ability to manage meds with max A.  SLP Short Term Goal 4 (Week 1): Pt to demonstrate ability sustain attention to task for 5 min with mod A.   Skilled Therapeutic Interventions:  Pt was seen for skilled ST targeting cognitive goals.  Session began late due to cathing with nursing.  Pt was able to recall at details surrounding catheterization, including rationale for reinsertion of cath with supervision question cues.  Pt continues to remain verbose during functional conversations with SLP and requires overall mod assist verbal cues for topic maintenance.  Min question cues needed to reorient to situation.  Pt verbalized that his son had called with concerns about pt's wife's ability to take care of him as she needs assistance herself for multiple medical issues.  CSW made aware.  Pt was left in bed with call bell within reach and bed alarm set.  Continue per current plan of care.    Function:  Eating Eating                 Cognition Comprehension Comprehension assist level: Understands basic 75 - 89% of the time/ requires cueing 10 - 24% of the time  Expression   Expression assist level: Expresses basic 90% of the time/requires cueing < 10% of the time.  Social Interaction Social Interaction assist level: Interacts appropriately 90% of the time - Needs monitoring or encouragement for participation or interaction.  Problem Solving Problem solving assist level: Solves basic 50 - 74% of the  time/requires cueing 25 - 49% of the time  Memory Memory assist level: Recognizes or recalls 50 - 74% of the time/requires cueing 25 - 49% of the time    Pain Pain Assessment Pain Assessment: No/denies pain  Therapy/Group: Individual Therapy  Jiselle Sheu, Melanee SpryNicole L 12/04/2015, 12:49 PM

## 2015-12-04 NOTE — Progress Notes (Signed)
Social Work Patient ID: David Duke, male   DOB: 11/04/1934, 81 y.o.   MRN: [id number] Have attempted to contact wife several times with busy signal. Only have a home number, will ask RN to get her cell phone number if she comes in to visit later today. Have left a message for his son, since missed him when he was here earlier today. Await return call from him. Had left wife a message yesterday and have yet to hear from her.

## 2015-12-04 NOTE — Plan of Care (Signed)
Problem: RH BLADDER ELIMINATION Goal: RH STG MANAGE BLADDER WITH ASSISTANCE STG Manage Bladder With total Assistance  Outcome: Not Progressing Patient due to void, unsuccessful I&O cath attempt, see prior progress note

## 2015-12-04 NOTE — Progress Notes (Addendum)
Patient with complaints of abdominal discomfort, and pressure. Abdomen noted to be distended. Made Marissa NestlePam Love, PA aware given verbal order to place a indwelling coude catheter. Received assistance from CooterJeanna, Charity fundraiserN. A indwelling coude 16 F catheter was inserted with the use of lidocaine gel, 750 ml of urine obtained, and urine was collected for a urine culture. Patient tolerated well, with no complaints. Will continue to monitor patient.

## 2015-12-04 NOTE — Progress Notes (Signed)
Occupational Therapy Session Note  Patient Details  Name: Marcell BarlowDavid R Ofarrell MRN: [id number] Date of Birth: 10/03/1934  Today's Date: 12/04/2015 OT Individual Time: 1330-1400 OT Individual Time Calculation (min): 30 min     Short Term Goals: Week 1:  OT Short Term Goal 1 (Week 1): STG=LTG d/t short anticipated LOS  Skilled Therapeutic Interventions/Progress Updates:    Pt seen for OT session focusing on functional mobility and education. Pt in supine upon arrival, voicing increased fatigue and "all over discomfort". Pt completed bridging in bed for LB clothing management following cues for technique. He transferred to EOB using hospital bed functions mod I. He sat EOB and spoke with therapist regarding d/c disposition with concerns about his wife's ability to take care of him at d/c. CSW made aware. Pt refused any OOB activity/ mobility. Educated regarding importance of mobility and benefits of OOB.   Pt told therapist about how he transferred from w/c > bed mod I without calling for assistance. Educated regarding use of call bell, pt's high fall risk, and need for assist- pt voiced understanding.  Pt left in supine with all needs in reach, bed alarm on, and recreation therapist present.   Therapy Documentation Precautions:  Precautions Precautions: Fall Restrictions Weight Bearing Restrictions: No Pain: Pain Assessment Pain Assessment: 0-10 Pain Score: 9  Pain Location:  (legs, hips, shoulders, head, back of neck) Pain Descriptors / Indicators:  (did not describe) Pain Intervention(s): RN made aware;Ambulation/increased activity;Repositioned ADL: ADL ADL Comments: see Functional Assessment Tool  See Function Navigator for Current Functional Status.   Therapy/Group: Individual Therapy  Lewis, Chetara Kropp C 12/04/2015, 2:04 PM

## 2015-12-05 ENCOUNTER — Inpatient Hospital Stay (HOSPITAL_COMMUNITY): Payer: Medicare Other | Admitting: Occupational Therapy

## 2015-12-05 ENCOUNTER — Inpatient Hospital Stay (HOSPITAL_COMMUNITY): Payer: Medicare Other | Admitting: Physical Therapy

## 2015-12-05 ENCOUNTER — Inpatient Hospital Stay (HOSPITAL_COMMUNITY): Payer: Medicare Other | Admitting: Speech Pathology

## 2015-12-05 LAB — BASIC METABOLIC PANEL
Anion gap: 11 (ref 5–15)
BUN: 20 mg/dL (ref 6–20)
CHLORIDE: 106 mmol/L (ref 101–111)
CO2: 22 mmol/L (ref 22–32)
Calcium: 8.9 mg/dL (ref 8.9–10.3)
Creatinine, Ser: 1.52 mg/dL — ABNORMAL HIGH (ref 0.61–1.24)
GFR calc Af Amer: 49 mL/min — ABNORMAL LOW (ref >60)
GFR calc non Af Amer: 42 mL/min — ABNORMAL LOW (ref >60)
Glucose, Bld: 93 mg/dL (ref 65–99)
POTASSIUM: 3.6 mmol/L (ref 3.5–5.1)
SODIUM: 139 mmol/L (ref 135–145)

## 2015-12-05 LAB — CBC WITH DIFFERENTIAL/PLATELET
Basophils Absolute: 0 10*3/uL (ref 0.0–0.1)
Basophils Relative: 0 %
EOS ABS: 0.7 10*3/uL (ref 0.0–0.7)
Eosinophils Relative: 7 %
HEMATOCRIT: 41.4 % (ref 39.0–52.0)
HEMOGLOBIN: 14 g/dL (ref 13.0–17.0)
LYMPHS ABS: 1.7 10*3/uL (ref 0.7–4.0)
LYMPHS PCT: 17 %
MCH: 30.2 pg (ref 26.0–34.0)
MCHC: 33.8 g/dL (ref 30.0–36.0)
MCV: 89.2 fL (ref 78.0–100.0)
MONOS PCT: 8 %
Monocytes Absolute: 0.8 10*3/uL (ref 0.1–1.0)
NEUTROS ABS: 6.8 10*3/uL (ref 1.7–7.7)
NEUTROS PCT: 68 %
Platelets: 294 10*3/uL (ref 150–400)
RBC: 4.64 MIL/uL (ref 4.22–5.81)
RDW: 13.2 % (ref 11.5–15.5)
WBC: 10 10*3/uL (ref 4.0–10.5)

## 2015-12-05 LAB — URINE CULTURE
CULTURE: NO GROWTH
Special Requests: NORMAL

## 2015-12-05 NOTE — Progress Notes (Signed)
Social Work Patient ID: David Duke, male   DOB: 11/26/1934, 81 y.o.   MRN: [id number] Spoke with pt's son-David Duke and obtained information for the psychosocial assessment. Have placed in chart. Aware team conference today will update after conference.

## 2015-12-05 NOTE — Consult Note (Signed)
NEUROBEHAVIORAL STATUS EXAM - [facility]   MEDICAL NECESSITY:  David ShoalsDavid Duke was [facility] for a neurobehavioral status exam owing to the patient's diagnosis of cerebral hemorrhage, and to assist in treatment planning during admission.   Records indicate that [name] is a "81 y.o. ambidextrous male with history of macular degeneration with blindness right eye, anxiety disorder, COPD/tobacco abuse. He was admitted from Dr. Ephriam Knucklesankin's office with blurred vision left eye as well as confusion.  Blood pressure 145/98. Cranial CT scan showed acute intraparenchymal hemorrhage within the left lower parietal occipital lobe measuring 5.6 x 2.9 cm with surrounding edema causing slight mass effect without midline shift. Echocardiogram showed EF f 70% grade 1 diastolic dysfunction. EEG nonspecific finding indicating of mild diffuse cerebral dysfunction no seizure activity. Carotid Dopplers was negative for ICA stenosis. Patient did not receive TPA due to intracranial hemorrhage. Follow-up cranial CT scan 11/26/2015 showing expected evolutionary changes with some contraction of the hyperdense clot in the left posterior temporal occipital region. No change in mass effect. Patient has been unable to tolerate attempts at MRI due to claustrophobia.  Agitation has resolved but he continues to have bouts of confusion. Dr. Pearlean BrownieSethi questioned CAA v/s hypertensive bleed.  He has had issues with worsening of HA as well as urinary retention requiring foley.  Had febrile episode with leucocytosis but no signs of infection on work up.  Therapy ongoing and patient with significant cognitive deficits, balance deficits as well as generalize weakness affecting mobility. CIR was recommended for follow up therapy."  During today's visit, [name] was accompanied by his wife who assisted with the history. The patient did not appear to be the best  historian, but thankfully she was as her reports were consistent with medical records. Patient denied suffering from any significant cognitive impairment, though his wife has noted severe issues with poor decision-making as well as significant impairments in memory and attention. Bouts of confusion have also been noted. Remote memories appear intact. Patient has a history of speech and vision issues that were exacerbated by the stroke. She also notes him to be much more angry and irritable. These bouts of agitation come out of nowhere and are very stressful for her. Several times during the appointment, the patient was noted to get off topic and focused than vision issues. His wife appeared to be very stressed in general about the situation and is frustrated with his inability to stay on topic.  From an emotional standpoint, the patient has been diagnosed with depression and has been taking an antidepressant since 2003. He takes Zoloft which he finds to be helpful. He has also recently been prescribed a benzodiazepine that he takes 3 times per day. His wife is not sure whether this is helpful. No adjustment issues endorsed. Suicidal/homicidal ideation, plan or intent was denied. No manic or hypomanic episodes were reported. The patient denied ever experiencing any auditory/visual hallucinations.   Patient feels that progress is being made in therapy. Cognitive deficits may be a barrier to therapy. He has been satisfied with the rehabilitation staff. His wife is his biggest supporter but she is unsure about how to safely transition him home. Thankfully she will be available every day as she has not been working of late.   PROCEDURES: [2 units 96116] Diagnostic clinical interview  Review of available records  MENTAL STATUS EXAM: APPEARANCE: Unkempt hair but generally normal  GEN: Alert but unable to stay on topic  MOOD: Generally euthymic AFFECT: Blunted SPEECH: Slowed but normal in prosody and  intonation THOUGHT CONTENT: Tangential HALLUCINATIONS:  None INTELLIGENCE:  Average  INSIGHT: Poor JUDGMENT: Poor SUICIDAL IDEATION:  Denies SI   HOMICIDAL IDEATION:   Denies HI   IMPRESSION: Overall, [name] denied experiencing any significant cognitive deficits, though this is not consistent with collateral information, which suggests the presence of severe deficits likely secondary to cerebrovascular compromise. From an emotional standpoint, there are reports of significant behavioral disturbance in the form of increased irritability and agitation. These bouts of agitation are sporadic and not precipitated by any particular factors. I discussed with his wife the possibility of initiating Neudexta for behavioral disturbance and also mentioned this to his physician provider. I do not feel that further cognitive evaluation is warranted at this point and the focus should be on behavioral management. This information was discussed with the social work team and neuropsychology will follow-up as needed.  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  Clinical Neuropsychologist

## 2015-12-05 NOTE — Progress Notes (Signed)
Occupational Therapy Session Note  Patient Details  Name: Marcell BarlowDavid R Ayad MRN: [id number] Date of Birth: 08/28/1935  Today's Date: 12/05/2015 OT Individual Time: [id number] OT Individual Time Calculation (min): 45 min    Short Term Goals: Week 1:  OT Short Term Goal 1 (Week 1): STG=LTG d/t short anticipated LOS  Skilled Therapeutic Interventions/Progress Updates:  Upon entering the room, pt receiving medication from RN. Pt with no c/o pain this session. Pt very talkative this session and requiring verbal cues to attend to task. Pt ambulating with RW and supervision to sink for grooming tasks. Pt standing for over 10 minutes to complete tasks with supervision and cues to locate items on sink secondary to low vision. Pt returned to recliner chair for B UE strengthening exercises with green resistance band 2 sets of 15 for shoulder flexion, shoulder diagonals, and chest pulls with min verbal cues for proper technique. Pt performed 5 reps of chair push ups with B UEs only this session. Quick release belt donned and call bell within reach upon exiting the room.   Therapy Documentation Precautions:  Precautions Precautions: Fall Restrictions Weight Bearing Restrictions: No Vital Signs: Therapy Vitals BP: 125/68 mmHg Patient Position (if appropriate): Sitting Pain: Pain Assessment Pain Assessment: No/denies pain ADL: ADL ADL Comments: see Functional Assessment Tool  See Function Navigator for Current Functional Status.   Therapy/Group: Individual Therapy  Lowella Gripittman, Lilyan Prete L 12/05/2015, 9:53 AM

## 2015-12-05 NOTE — Progress Notes (Signed)
Social Work Patient ID: David Barlowavid R Yamada, male   DOB: 10/06/1934, 81 y.o.   MRN: [id number] Spoke with son-dave via telephone to discuss team conference goals-supervision and target discharge 4/4. He is aware dad will need 24 hr supervision due to vision and Needing cues. He wonders if his step-mom will provide this level of care, but wants to give her a chance and leave it up to his Dad. His Dad is aware his door is always open and he can Come and stay with him. Son feels Dad will need to decide what he wants to do at discharge home with wife versus home with him. Will talk with wife when here at 3:30 today to inform Team conference and goals.

## 2015-12-05 NOTE — Progress Notes (Signed)
Social Work Patient ID: David Duke, male   DOB: July 01, 1935, 81 y.o.   MRN: [id number] Have met with pt to discuss team conference goals supervision and target discharge 4/4. Trying to wait for his wife to get here to discuss with her also, but she has not arrived yet. Made aware she had a health issue yesterday and was in the ER, but not admitted. Have left a message on their house phone and will wait to see her once here.

## 2015-12-05 NOTE — Progress Notes (Signed)
[facility] PHYSICAL MEDICINE & REHABILITATION     PROGRESS NOTE  Subjective/Complaints:  Patient seen lying in bed this morning. He states he slept better than the previous night. His wife requested that I call her.  ROS: Denies CP, SOB, N/V/D  Objective: Vital Signs: Blood pressure 125/68, pulse 73, temperature 98.2 F (36.8 C), temperature source Oral, resp. rate 18, height 5\' 8"  (1.727 m), weight 63.9 kg (140 lb 14 oz), SpO2 96 %. No results found.  Recent Labs  12/05/15 0447  WBC 10.0  HGB 14.0  HCT 41.4  PLT 294    Recent Labs  12/03/15 0638 12/05/15 0447  NA 138 139  K 3.9 3.6  CL 106 106  GLUCOSE 107* 93  BUN 24* 20  CREATININE 1.56* 1.52*  CALCIUM 9.2 8.9   CBG (last 3)  No results for input(s): GLUCAP in the last 72 hours.  Wt Readings from Last 3 Encounters:  11/28/15 63.9 kg (140 lb 14 oz)  11/22/15 64.1 kg (141 lb 5 oz)  03/31/15 67.586 kg (149 lb)    Physical Exam:  BP 125/68 mmHg  Pulse 73  Temp(Src) 98.2 F (36.8 C) (Oral)  Resp 18  Ht 5\' 8"  (1.727 m)  Wt 63.9 kg (140 lb 14 oz)  BMI 21.42 kg/m2  SpO2 96% Constitutional: He appears well-developed and well-nourished. NAD.  HENT: Normocephalic and atraumatic.  Eyes: Conjunctivae and EOM are normal.  Cardiovascular: Normal rate and regular rhythm.No murmur heard. Respiratory: Effort normal. No respiratory distress.  GI: Soft. Bowel sounds are normal. He exhibits no distension. There is no tenderness.  Musculoskeletal: He exhibits no edema or tenderness.  Neurological: He is alert.  Has poor safety awareness.  Mild ataxia with BUE.  A&Ox2 Motor: B/l UE: 5/5 proximal to distal B/l LE 5/5 proximal to distal  Skin: Skin is warm and dry. No rash noted. No erythema.  Psychiatric: His speech is tangential. Thought process is slowed. Cognition and memory are impaired.     Assessment/Plan: 1. Functional deficits secondary to left ICH which require 3+ hours per day of interdisciplinary  therapy in a comprehensive inpatient rehab setting. Physiatrist is providing close team supervision and 24 hour management of active medical problems listed below. Physiatrist and rehab team continue to assess barriers to discharge/monitor patient progress toward functional and medical goals.  Function:  Bathing Bathing position Bathing activity did not occur:  (he only bathed arm pits, back, & face) Position: Sitting EOB  Bathing parts Body parts bathed by patient: Right arm, Left arm, Chest, Abdomen Body parts bathed by helper: Back  Bathing assist Assist Level: Set up, Supervision or verbal cues      Upper Body Dressing/Undressing Upper body dressing   What is the patient wearing?: Pull over shirt/dress, Button up shirt     Pull over shirt/dress - Perfomed by patient: Thread/unthread right sleeve, Thread/unthread left sleeve, Put head through opening, Pull shirt over trunk   Button up shirt - Perfomed by patient: Pull shirt around back, Thread/unthread left sleeve (he elected not to button his shirt)      Upper body assist Assist Level: Touching or steadying assistance(Pt > 75%)   Set up : To obtain clothing/put away  Lower Body Dressing/Undressing Lower body dressing Lower body dressing/undressing activity did not occur: Refused What is the patient wearing?: Ted Hose Underwear - Performed by patient: Thread/unthread right underwear leg, Thread/unthread left underwear leg, Pull underwear up/down   Pants- Performed by patient: Thread/unthread right pants leg,  Pull pants up/down, Fasten/unfasten pants Pants- Performed by helper: Thread/unthread left pants leg Non-skid slipper socks- Performed by patient: Don/doff right sock, Don/doff left sock                 TED Hose - Performed by helper: Don/doff right TED hose, Don/doff left TED hose  Lower body assist Assist for lower body dressing: Touching or steadying assistance (Pt > 75%)      Toileting Toileting Toileting  activity did not occur: N/A Toileting steps completed by patient: Adjust clothing prior to toileting, Adjust clothing after toileting Toileting steps completed by helper: Performs perineal hygiene Toileting Assistive Devices: Grab bar or rail  Toileting assist Assist level: More than reasonable time, Supervision or verbal cues   Transfers Chair/bed transfer   Chair/bed transfer method: Stand pivot Chair/bed transfer assist level: Supervision or verbal cues Chair/bed transfer assistive device: Bedrails, Patent attorney     Max distance: >300 Assist level: Supervision or Careers adviser activity did not occur: N/A Type: Manual Max wheelchair distance: 237ft Assist Level: Touching or steadying assistance (Pt > 75%)  Cognition Comprehension Comprehension assist level: Understands basic 75 - 89% of the time/ requires cueing 10 - 24% of the time  Expression Expression assist level: Expresses basic 90% of the time/requires cueing < 10% of the time.  Social Interaction Social Interaction assist level: Interacts appropriately 90% of the time - Needs monitoring or encouragement for participation or interaction.  Problem Solving Problem solving assist level: Solves basic 50 - 74% of the time/requires cueing 25 - 49% of the time  Memory Memory assist level: Recognizes or recalls 50 - 74% of the time/requires cueing 25 - 49% of the time    Medical Problem List and Plan: 1. Balance deficits and poor cognition secondary to left ICH.  Continue CIR  2. DVT Prophylaxis/Anticoagulation: Mechanical: Sequential compression devices, below knee Bilateral lower extremities 3. Pain Management: Tylenol prn for pain 4. Mood: LCSW to follow for evaluation and support.  5. Neuropsych: This patient is not fully capable of making decisions on his own behalf. 6. Skin/Wound Care: routine pressure relief measures. 7. Fluids/Electrolytes/Nutrition: Monitor I/O.  8. H/o  BPH/Urinary retention:    UA with rare bacteria on 3/23, urine culture with insignificant growth   Changed flomax to rapaflo adjusted for kidney function.   Bethanechol 10 added on 3/27, increased to 25 on 3/28  Urine culture pending 9. Depression/PTSD/anxiety: On Zoloft daily with valium prn.  10. COPD: Uses albuterol daily due to "tightness' Also has issues with congestion.  Chest x-ray reviewed from 3/22, relatively unremarkable  Changed schedule nebs to when necessary on 3/23.  11. Headaches: Gets one "every afternoon".   Fioricet changed to Topamax 25 mg on 3/23, increased to 50 on 3/27.   Added Claritin and Flonase to help with nasal/sinus congestion.  12. Sundowning: wife reporting intermittent confusion worse in the evenings.   Continue to reorient and adjust sleep-wake cycle.  13. Prediabetes: Periodically monitor 14. AKI: continue to monitor. Avoid nephrotoxic meds  Creatinine 1.52 on 3/29  Encourage oral fluids 15: Hypokalemia: Resolved  3.6 on 3/29  Will continue to monitor  16. Sinusitis  Augmentin started 3/24-3/31  LOS (Days) 7 A FACE TO FACE EVALUATION WAS PERFORMED  Ankit Karis Juba 12/05/2015 8:52 AM

## 2015-12-05 NOTE — Progress Notes (Signed)
Physical Therapy Session Note  Patient Details  Name: David BarlowDavid R Duke MRN: [id number] Date of Birth: 08/18/1935  Today's Date: 12/05/2015 PT Individual Time: 0800-0900 PT Individual Time Calculation (min): 60 min    Skilled Therapeutic Interventions/Progress Updates:    Pt received in bed & agreeable to PT, denying c/o pain. Pt transferred supine>sit with supervision/mod I without hospital bed features. Pt able to independently don shoes sitting at EOB with supervision, as well as perform grooming tasks at sink without BUE support with distant supervision and no LOB. Pt requested seated rest break after task, reporting "I just feel tired all over"'; BP = 125/68 mmHg. Pt with improving awareness of proper hand placement during transfers. Pt voiced need to use bathroom and able to ambulate in bathroom & transfer to/from toilet with supervision A. Pt able to safely transfer RW over obstacles, as well as step on/over compliant surfaces with safe technique. Pt able to ambulate from room>gym ~120 ft with RW & supervision. Pt negotiated obstacle course with supervision, requiring cues for safety technique and to clear foot over obstacles. At end of session pt ambulate 120 ft back to room with RW & supervision, left in recliner with QRB in place & all needs within reach.   Therapy Documentation Precautions:  Precautions Precautions: Fall Restrictions Weight Bearing Restrictions: No  Pain: Pain Assessment Pain Assessment: No/denies pain   See Function Navigator for Current Functional Status.   Therapy/Group: Individual Therapy  Sandi MariscalVictoria M Perla Echavarria 12/05/2015, 8:12 AM

## 2015-12-05 NOTE — Patient Care Conference (Signed)
Inpatient RehabilitationTeam Conference and Plan of Care Update Date: 12/05/2015   Time: 2:00 PM    Patient Name: David Duke      Medical Record Number: [id number]  Date of Birth: 1935-06-25 Sex: Male         Room/Bed: 4W14C/4W14C-01 Payor Info: Payor: Advertising copywriter MEDICARE / Plan: [employer] MEDICARE / Product Type: *No Product type* /    Admitting Diagnosis: ICH  Admit Date/Time:  11/28/2015  3:46 PM Admission Comments: No comment available   Primary Diagnosis:  <principal problem not specified> Principal Problem: <principal problem not specified>  Patient Active Problem List   Diagnosis Date Noted  . Chronic tension-type headache, not intractable   . Acute frontal sinusitis   . 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 Gait disturbance, post-stroke   . Ataxia, late effect of cerebrovascular disease   . BPH (benign prostatic hyperplasia)   . Urinary retention   . Adjustment disorder with mixed anxiety and depressed mood   . Chronic obstructive pulmonary disease (HCC)   . Cephalalgia   . AKI (acute kidney injury) (HCC)   . Macular degeneration   . Prediabetes   . Hypokalemia   . Absolute anemia   . Aphasia   . Acute encephalopathy 11/25/2015  . Hyponatremia 11/25/2015  . Acute kidney injury (HCC) 11/25/2015  . Depression   . Anxiety   . COPD (chronic obstructive pulmonary disease) (HCC)   . ICH (intracerebral hemorrhage) (HCC) 11/22/2015    Expected Discharge Date: Expected Discharge Date: 12/11/15  Team Members Present: Physician leading conference: Dr. Maryla Morrow Social Worker Present: Dossie Der, LCSW Nurse Present: Chana Bode, RN PT Present: Other (comment) Grier Rocher & Aleda Grana PT) OT Present: Roney Mans, OT SLP Present: Jackalyn Lombard, SLP PPS Coordinator present : Tora Duck, RN, CRRN     Current Status/Progress Goal Weekly Team Focus  Medical   Balance deficits and poor cognition secondary to left ICH   Improve cognition, urinary retention, headaches  see above   Bowel/Bladder   Coude foley placed 3/28; LBM 3/28  Bladder total assistance; Bowel with Min assistance  Continue monitoring urine output with foley and regular bowel movements   Swallow/Nutrition/ Hydration             ADL's   Min-Mod A for BADL, Min-Supervision for transfers, Mod A for cognition  Supervision for dynanmic standing, bath transfers and baithing, Mod I for toileting and dressing  Activity tolerance, transfers, attention/awareness/memory, dynanmic standing balance, safety awareness   Mobility   supervision for ambulation with RW over even/uneven surfaces, supervision for bed mobility & transfers & standing balance; limited by cognition  Mod I for ambulation with LRAD, stair negotiation, and all transfers  increasing safety awareness, standing balance, endurance training, stair training   Communication             Safety/Cognition/ Behavioral Observations  fluctuates min-max assist for very basic tasks, tangential, decresaed sustained attention to task, decreased functional problem solving, limited awareness of defiicts, impulsive, orientation fluctuates   mod assist-downgraded   basic cognition, family education    Pain   Intermittent headaches requiring ultracet rating 6-9/10.   < 3 on 0-10 pain scale  Continue monitoring headaches   Skin   Rash on back with nystatin powder, no complaints of itching  No new breakdown while on Rehab.   Continue foley care; qshift skin assessment.       *See Care Plan and progress notes for long  and short-term goals.  Barriers to Discharge: Urinary retention, headaches, AKI    Possible Resolutions to Barriers:  Increase urinary meds, optimize headache meds, encourage PO fluids    Discharge Planning/Teaching Needs:  Home with wife who can provide supervision but has health issues of her own. Son also involved and here daily      Team Discussion:  Goals will be downgraded to  supervision level due to safety and needing cues for functional tasks. Working on endurance. Will go home with foley and follow up with urologist. Will need family education prior to discharge to make sure comfortable with pt's care at home.  Revisions to Treatment Plan:  Downgraded goals to supervision level   Continued Need for Acute Rehabilitation Level of Care: The patient requires daily medical management by a physician with specialized training in physical medicine and rehabilitation for the following conditions: Daily direction of a multidisciplinary physical rehabilitation program to ensure safe treatment while eliciting the highest outcome that is of practical value to the patient.: Yes Daily medical management of patient stability for increased activity during participation in an intensive rehabilitation regime.: Yes Daily analysis of laboratory values and/or radiology reports with any subsequent need for medication adjustment of medical intervention for : Neurological problems;Renal problems;Mood/behavior problems;Urological problems  Lucy ChrisDupree, Azka Steger G 12/05/2015, 2:55 PM

## 2015-12-05 NOTE — Progress Notes (Signed)
Speech Language Pathology Daily Session Note  Patient Details  Name: David Duke MRN: [id number] Date of Birth: 07/15/1935  Today's Date: 12/05/2015 SLP Individual Time: 1400-1430 SLP Individual Time Calculation (min): 30 min  Short Term Goals: Week 1: SLP Short Term Goal 1 (Week 1): Pt to consistently demonstrate O x 4 with min A.  SLP Short Term Goal 2 (Week 1): Pt to demonstrate recall of daily activities with mod A of questioning cues.  SLP Short Term Goal 3 (Week 1): Pt to demonstrate ability to manage meds with max A.  SLP Short Term Goal 4 (Week 1): Pt to demonstrate ability sustain attention to task for 5 min with mod A.   Skilled Therapeutic Interventions: Skilled treatment session focused on addressing cognition goals. Patient oriented to self and place with extra time.  SLP facilitated orientation to place and situation with Max assist multimodal cues.  Patient with some recall and awareness of deficits reporting "they tell me I have balance issues." However, patient reports that he is not convinced that he has really had a stroke or any deficits.  Patient required Mod question cues to sustain attention to/maintain topic of conversation for ~3 turns.  Continue with current plan of care.    Function:  Cognition Comprehension Comprehension assist level: Understands basic 75 - 89% of the time/ requires cueing 10 - 24% of the time  Expression   Expression assist level: Expresses basic 75 - 89% of the time/requires cueing 10 - 24% of the time. Needs helper to occlude trach/needs to repeat words.  Social Interaction Social Interaction assist level: Interacts appropriately 90% of the time - Needs monitoring or encouragement for participation or interaction.  Problem Solving Problem solving assist level: Solves basic 50 - 74% of the time/requires cueing 25 - 49% of the time  Memory Memory assist level: Recognizes or recalls 25 - 49% of the time/requires cueing 50 - 75% of the time     Pain Pain Assessment Pain Assessment: No/denies pain  Therapy/Group: Individual Therapy  Charlane FerrettiMelissa Celes Dedic, M.A., CCC-SLP [phone]  David Duke 12/05/2015, 2:54 PM

## 2015-12-05 NOTE — Progress Notes (Signed)
Social Work Assessment and Plan Social Work Assessment and Plan  Patient Details  Name: David Duke MRN: [id number] Date of Birth: 05-Jun-1935  Today's Date: 12/05/2015  Problem List:  Patient Active Problem List   Diagnosis Date Noted  . Chronic tension-type headache, not intractable   . Acute frontal sinusitis   . 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 Gait disturbance, post-stroke   . Ataxia, late effect of cerebrovascular disease   . BPH (benign prostatic hyperplasia)   . Urinary retention   . Adjustment disorder with mixed anxiety and depressed mood   . Chronic obstructive pulmonary disease (HCC)   . Cephalalgia   . AKI (acute kidney injury) (HCC)   . Macular degeneration   . Prediabetes   . Hypokalemia   . Absolute anemia   . Aphasia   . Acute encephalopathy 11/25/2015  . Hyponatremia 11/25/2015  . Acute kidney injury (HCC) 11/25/2015  . Depression   . Anxiety   . COPD (chronic obstructive pulmonary disease) (HCC)   . ICH (intracerebral hemorrhage) (HCC) 11/22/2015   Past Medical History:  Past Medical History  Diagnosis Date  . High cholesterol   . Depression   . Anxiety   . Macular degeneration of both eyes   . Blind right eye   . COPD (chronic obstructive pulmonary disease) (HCC)   . Shortness of breath dyspnea    Past Surgical History:  Past Surgical History  Procedure Laterality Date  . No past surgeries     Social History:  reports that he has been smoking Cigarettes.  He has a 30 pack-year smoking history. He does not have any smokeless tobacco history on file. He reports that he does not drink alcohol or use illicit drugs.  Family / Support Systems Marital Status: Married How Long?: 25 years Patient Roles: Spouse, Parent Spouse/Significant Other: David Duke [phone] Children: David Duke [phone] Other Supports: friends Anticipated Caregiver: Wife Ability/Limitations of Caregiver: Wife has health issues of her  own but can provide supervision level, she does not drive Caregiver Availability: 24/7 Family Dynamics: Pt has been married to David Duke for 25 years she is his fifth wife. She and David Duke have a complicated relationship, they are only four years apart. David Duke states: " I try to get along with her, but at times it is difficult." Pt feels it is what it is.   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 Son is here enough to know what is going on and wife has been here daily to provide support to pt   Abuse/Neglect Physical Abuse: Denies Verbal Abuse: Denies Sexual Abuse: Denies Exploitation of patient/patient's resources: Denies Self-Neglect: Denies  Emotional Status Pt's affect, behavior adn adjustment status: Pt is motivated to improve and recover from this, he does realize his bladder issues will be dealth with as an OP. He has always been able to take care of himself and help his wife, he is the driver of the family. He is making progress in therapies which is encouraging to him. Recent Psychosocial Issues: other health issues-macular degeneration Pyschiatric History: History of depression/anxiety takes medications for and finds them helpful. He may benefit from neuro-psych seeing for coping. Wife may benefit from  as well. Substance Abuse History: No issues with according to pt and wife  Patient / Family Perceptions, Expectations & Goals Pt/Family understanding of illness & functional limitations: Pt has a basic understanding of his condition and wife and son have a better understanding of his treatment plan and have  spoken with the MD and feel he is on the right path now, that bladder is being taken care of. Son and wife very involved in his care and want to know all this is going on. Premorbid pt/family roles/activities: Wife, Son, friends, church member, etc Anticipated changes in roles/activities/participation: resume Pt/family expectations/goals: Pt states: " I want to be doing well before I go home."  Wife states: " I hope he is not a lot of care, I can't do that."  David HuaDavid states: " He is doing so much better, I hope this continues."  [employer] IncCommunity Resources Community Agencies: None Premorbid Home Care/DME Agencies: None Transportation available at discharge: Friends-pt was the driver since wife has epilepsy Resource referrals recommended: Neuropsychology, Support group (specify)  Discharge Planning Living Arrangements: Spouse/significant other Support Systems: Spouse/significant other, Children, Friends/neighbors Type of Residence: Private residence Civil engineer, contractingnsurance Resources: Media plannerrivate Insurance (specify) Investment banker, operational(UHC-Medicare) Financial Resources: Restaurant manager, fast foodocial Security Financial Screen Referred: No Living Expenses: Own Money Management: Patient, Spouse Does the patient have any problems obtaining your medications?: No Home Management: Wife does the home management Patient/Family Preliminary Plans: Return home with wife who can provide supervision level due to her own health issues. They will need to rely upon friends to provide transportation since pt will not be able to drive when discharged from here. Concerned have not seen wife yesterday and have attempted to contact several times with buys signal. See if can contact today. Social Work Anticipated Follow Up Needs: HH/OP, Support Group  Clinical Impression Pleasant stubborn gentleman who has his own way of doing things. He wants to regain his independence before discharge and eventually resume driving. Aware up to the MD. Supportive wife and son who have a  complicated Relationship, but do want what is best for the pt. Will work with all of them on a safe discharge plan. Aware pt will require atleast 24 hr supervision due to cueing at times and for safety.  Lucy Chrisupree, Abrar Bilton G 12/05/2015, 10:01 AM

## 2015-12-05 NOTE — Plan of Care (Signed)
Problem: RH Balance Goal: LTG Patient will maintain dynamic standing balance (PT) LTG: Patient will maintain dynamic standing balance with assistance during mobility activities (PT)  Downgraded 2/2 slow progress  Problem: RH Bed Mobility Goal: LTG Patient will perform bed mobility with assist (PT) LTG: Patient will perform bed mobility with assistance, with/without cues (PT).  Downgraded 2/2 slow progress; without hospital bed features  Problem: RH Bed to Chair Transfers Goal: LTG Patient will perform bed/chair transfers w/assist (PT) LTG: Patient will perform bed/chair transfers with assistance, with/without cues (PT).  Downgraded 2/2 slow progress  Problem: RH Car Transfers Goal: LTG Patient will perform car transfers with assist (PT) LTG: Patient will perform car transfers with assistance (PT).  With LRAD; downgraded 2/2 slow progress  Problem: RH Ambulation Goal: LTG Patient will ambulate in controlled environment (PT) LTG: Patient will ambulate in a controlled environment, # of feet with assistance (PT).  300 ft with LRAD; downgraded 2/2 slow progress Goal: LTG Patient will ambulate in home environment (PT) LTG: Patient will ambulate in home environment, # of feet with assistance (PT).  50 ft with LRAD; downgraded 2/2 slow progress Goal: LTG Patient will ambulate in community environment (PT) LTG: Patient will ambulate in community environment, # of feet with assistance (PT).  300 ft with LRAD; downgraded 2/2 slow progress  Problem: RH Stairs Goal: LTG Patient will ambulate up and down stairs w/assist (PT) LTG: Patient will ambulate up and down # of stairs with assistance (PT)  4 steps B rails; downgraded 2/2 slow progress

## 2015-12-05 NOTE — Progress Notes (Signed)
Physical Therapy Session Note  Patient Details  Name: David Duke MRN: [id number] Date of Birth: [birth year]  Today's Date: 12/05/2015 PT Individual Time: 1430-1530 PT Individual Time Calculation (min): 60 min   Short Term Goals: Week 1:    LTG = STG due to short length of stay.   Skilled Therapeutic Interventions/Progress Updates:    Patient received supine in bed. And agreeable to PT. Patient reported increased dizziness with standing. Orthostatic vitals taken, 107/74 sitting, 107/67 standing. Patient instructed in gait training to rehab gym for 125 ft with RW and supervision A. Standing balance training with toe taps to 3 cones in random order. Rocker board with Anterior Posterior rock with BUE and 1 UE support 2 x 2 min each. Stabilize balance board without UE support up to 30 seconds for 3 reps with Close supervisoin A, as well as cues for improved use and ankle strategy to prevent LOB. Sit to stand with ball toss x 12 .   Gait training in hall for 28f [place]; PT provided moderate cues to remain on task, which had little effect. Step ladder in P[place] bars: 1 foot in each rung x 4 laps. Side stepping with 2 feet in each rung x 4 laps.  Patient performed gait training back to room with RW for 125 ft and cues for AD management. Patient left supine in bed with bed alarm set and all other needs met.   Therapy Documentation Precautions:  Precautions Precautions: Fall Restrictions Weight Bearing Restrictions: No General:   Vital Signs:     See Function Navigator for Current Functional Status.   Therapy/Group: Individual Therapy  ALorie Phenix3/29/2017, 5:34 PM

## 2015-12-06 ENCOUNTER — Inpatient Hospital Stay (HOSPITAL_COMMUNITY): Payer: Medicare Other | Admitting: Physical Therapy

## 2015-12-06 ENCOUNTER — Inpatient Hospital Stay (HOSPITAL_COMMUNITY): Payer: Medicare Other

## 2015-12-06 ENCOUNTER — Inpatient Hospital Stay (HOSPITAL_COMMUNITY): Payer: Medicare Other | Admitting: Speech Pathology

## 2015-12-06 NOTE — Progress Notes (Signed)
Social Work Patient ID: David Duke, male   DOB: 01-Apr-1935, 81 y.o.   MRN: [id number] Met with wife when here to attend therapies with pt. She is learning his care and trying to instruct him if he will let her. She is willing to learn and assist pt at home. She is aware of the target discharge date and his 24 hr supervision needs. She reports at time she is not nice to her and yells at her. Discussed pt feel safer to get their frustrations out on  Family members rather staff members. He does acknowledge he is frustrated with his deficits and worried about not being able to drive at discharge. Wife will learn to re-direct and different strategies To assist her husband, so comfortable of his care at discharge. Will continue to work on discharge needs for Tuesday.

## 2015-12-06 NOTE — Progress Notes (Signed)
Physical Therapy Session Note  Patient Details  Name: David BarlowDavid R Orellana MRN: [id number] Date of Birth: 02/16/1935  Today's Date: 12/06/2015 PT Individual Time: 4010-27251616-1701 PT Individual Time Calculation (min): 45 min   Short Term Goals: Week 1:  PT Short Term Goal 1 (Week 1): STG = LTG due to short LOS  Skilled Therapeutic Interventions/Progress Updates:    Patient received supine in bed and agreeable to PT. Gait training with RW on level surface for 11125ft x2, 25200ft, and 38300ft with cues for AD management to maintain BOS with RW and prevent continued motion with only 1 hand on RW. Gait training on uneven surface including cement and bricked sidewalk for 23150ft with cues for AD management on unlevel terrain as well as decreased flexed posture with increased distance.  Balance training: Standing on airex pad, 30 seconds x 3 with one posterior LOB. Standing on airex pad with toe taps on 5 inch step x 10 each LE with min-mod A to prevent posterior LOB, as well as constant cues for proper technique and decreased speed of movement. Standing on Airex with anterior/posterior and lateral pertubations x 4 minutes, mod A x 4 to prevent posterior LOB and cues for improve use of ankle and hip strategy to prevent LOB.  Patient returned to room and left supine in bed with Bed alarm set and call bell within reach.      Therapy Documentation Precautions:  Precautions Precautions: Fall Restrictions Weight Bearing Restrictions: No    Vital Signs:   Pain: Pain Assessment Pain Assessment: No/denies pain Pain Score: 3  Pain Type: Chronic pain Pain Location: Head Pain Descriptors / Indicators: Aching Pain Intervention(s): Medication (See eMAR)   See Function Navigator for Current Functional Status.   Therapy/Group: Individual Therapy  Golden Popustin E Riki Berninger 12/06/2015, 5:56 PM

## 2015-12-06 NOTE — Progress Notes (Signed)
Speech Language Pathology Weekly Progress and Session Note  Patient Details  Name: David Duke MRN: [id number] Date of Birth: 11/25/1934  Beginning of progress report period: 11/30/2015   End of progress report period: 12/06/2015  Today's Date: 12/06/2015 SLP Individual Time: 1583-0940 SLP Individual Time Calculation (min): 23 min  Short Term Goals: Week 1: SLP Short Term Goal 1 (Week 1): Pt to consistently demonstrate O x 4 with min A.  SLP Short Term Goal 1 - Progress (Week 1): Met SLP Short Term Goal 2 (Week 1): Pt to demonstrate recall of daily activities with mod A of questioning cues.  SLP Short Term Goal 2 - Progress (Week 1): Met SLP Short Term Goal 3 (Week 1): Pt to demonstrate ability to manage meds with max A.  SLP Short Term Goal 3 - Progress (Week 1): Other (comment) (not addressed this reporting period to allow focus on lower level cognition ) SLP Short Term Goal 4 (Week 1): Pt to demonstrate ability sustain attention to task for 5 min with mod A.  SLP Short Term Goal 4 - Progress (Week 1): Met    New Short Term Goals: Week 2: SLP Short Term Goal 1 (Week 2): Pt to consistently demonstrate O x 4 with mod I.  SLP Short Term Goal 2 (Week 2): Pt to demonstrate recall of daily activities with min a questioning cues.  SLP Short Term Goal 3 (Week 2): Pt to demonstrate ability to manage meds with max A.  SLP Short Term Goal 4 (Week 2): Pt to demonstrate ability sustain attention to task for 10 min with mod A.   Weekly Progress Updates:  Pt made functional gains this reporting period and has met 3 out of 4 short term goals. Pt is overall mod assist for basic cognitive tasks due to decreased sustained attention to tasks, fluctuating orientation to place and situation, and decreased intellectual awareness of deficits.  Pt's primary limiting factors to his progress are his poor topic maintenance and verbosity for which pt requires frequent redirection during any basic tasks.  Pt  would continue to benefit from skilled ST while inpatient in order to maximize functional independence and reduce burden of care prior to discharge.  Recommend that pt have 24/7 supervision at discharge in addition to [place] follow up at next level of care.  Pt and family education is ongoing.      Intensity: Minumum of 1-2 x/day, 30 to 90 minutes Frequency: 3 to 5 out of 7 days Duration/Length of Stay: 10-12 days Treatment/Interventions: Cognitive remediation/compensation;Cueing hierarchy;Functional tasks;Speech/Language facilitation;Therapeutic Activities;Medication managment;Patient/family education   Daily Session  Skilled Therapeutic Interventions: Pt was seen for skilled ST targeting cognitive goals.  SLP facilitated the session with functional conversations regarding pt's current limitations s/p ICH and their impact on pt's ability to return to completing familiar home management and self care tasks safely.  Pt required max assist question cues to identify at least 3 deficits occuring s/p hemorrhage but was unable to generate correlation of deficits with decreased independence in the home environment due to verbosity and poor topic maintenance.  Pt also required supervision question cues to reorient to place and situation.  Pt aware of team's recommendations that he have 24/7 supervision at discharge but was unable to identify someone who could stay with him.  Pt was left in bed with call bell within reach and bed alarm set.     Function:   Eating Eating  Cognition Comprehension Comprehension assist level: Understands basic 75 - 89% of the time/ requires cueing 10 - 24% of the time  Expression   Expression assist level: Expresses basic 75 - 89% of the time/requires cueing 10 - 24% of the time. Needs helper to occlude trach/needs to repeat words.  Social Interaction Social Interaction assist level: Interacts appropriately 90% of the time - Needs monitoring or  encouragement for participation or interaction.  Problem Solving Problem solving assist level: Solves basic 50 - 74% of the time/requires cueing 25 - 49% of the time  Memory Memory assist level: Recognizes or recalls 25 - 49% of the time/requires cueing 50 - 75% of the time   General    Pain Pain Assessment Pain Assessment: No/denies pain   Therapy/Group: Individual Therapy  Louana Fontenot, Selinda Orion 12/06/2015, 5:19 PM

## 2015-12-06 NOTE — Progress Notes (Signed)
Occupational Therapy Weekly Progress Note  Patient Details  Name: David Duke MRN: [id number] Date of Birth: 15-Jul-1935  Beginning of progress report period: November 29, 2015 End of progress report period: December 06, 2015  Today's Date: 12/06/2015 OT Individual Time: 1100-1200 OT Individual Time Calculation (min): 60 min   Patient has met 1 of 1 short term goals.    Patient continues to demonstrate the following deficits: Impaired awareness and problem-solving, visual impairment, impaired endurance, and therefore will continue to benefit from skilled OT intervention to enhance overall performance with BADL.  Patient progressing toward long term goals..  Plan of care revisions: LTG downgraded to supervision level d/t cognitive impairment.  OT Short Term Goals Week 1:  OT Short Term Goal 1 (Week 1): STG=LTG d/t short anticipated LOS Week 2:  OT Short Term Goal 1 (Week 2): Pt will demo ability to attend to his wife's cues during functional transfers 100% of the time OT Short Term Goal 2 (Week 2): Pt will incorporate visual scanning strategies during functional task to locate 5 of 5 items placed within reach OT Short Term Goal 3 (Week 2): Pt will demo ability to manage catheter bag during functional transfers with supervision OT Short Term Goal 4 (Week 2): Pt will perform lower body dressing at sink with min assist to place catheter bag through pant leg  Skilled Therapeutic Interventions/Progress Updates: ADL-retraining (30 min) with focus on family education relating to bathroom transfers (toilet and shower chair).   With wife attending to provide min guard assist, pt completed bed mobility, sit<>stand to RW, and transfers to toilet and shower chair with close supervision, mod instructional cues, and contact guard.  OT provided 1:1 instruction with pt's wife who provided assist needed during transfers and mobility as instructed.   Therapeutic activity (30 min) with focus on visual scanning  using [employer].    Pt completed 2 timed trials (30 sec) of routine A from w/c height to attend to lower right quadrant only.   Pt did not incoporate scanning during first trial.  1st trial reaction time was 4:96 seconds to hit 6 targets, and second trial improved to 1.93 seconds with 14 targets.  Pt was escorted back to his room at end of session with QRB attached and call light placed within reach.     Therapy Documentation Precautions:  Precautions Precautions: Fall Restrictions Weight Bearing Restrictions: No  Pain: 5/10, neck/headache, relieved adequately with soft tissue mobilization (massage) and distraction.   ADL: ADL ADL Comments: see Functional Assessment Tool  See Function Navigator for Current Functional Status.   Therapy/Group: Individual Therapy  [place] 12/06/2015, 12:31 PM

## 2015-12-06 NOTE — Progress Notes (Signed)
Physical Therapy Weekly Progress Note  Patient Details  Name: David Duke MRN: [id number] Date of Birth: [birth year]  Beginning of progress report period: November 29, 2015 End of progress report period: December 06, 2015  Today's Date: 12/06/2015 PT Individual Time: 0803-0900 PT Individual Time Calculation (min): 57 min   Patient has met 1 of 1 short term goals.  STG =LTG. Patient continues to make progress toward LTG.   Patient continues to demonstrate the following deficits: Decreased balance, decreased awareness, difficulty walking, decreased safety and therefore will continue to benefit from skilled PT intervention to enhance overall performance with activity tolerance, balance, postural control, attention and awareness.  Patient progressing toward long term goals..  Continue plan of care.  PT Short Term Goals Week 1:  PT Short Term Goal 1 (Week 1): STG = LTG due to short LOS  Skilled Therapeutic Interventions/Progress Updates:    Patient received supine in bed with trade off from [employer]. Patient agreeable to PT. Gait training in hall with RW for 125 ft with only min cues for AD management and Supervision A. Nustep for endurance training for 10 minutes at level 4-6 with min cues from PT for increased ROM and proper speed of exercise. Patient transported to [facility] for bowel movement. Supervision A for toilet transfer. Patient required moderate cueing for proper hygiene and problem solving for appropriate cleaning product following bowel movement. Balance training while standing on 4 inch wedge to move 8 clothes pins on clothes pin tree x 4 with R UE and LUE. Moderate cueing required for improved problem solving and awareness of task. Gait training with walker for 88f x 2 and 1564fwith supervision A. Patient returned to room and left supine in bed with bed alarm set and call bell within reach.      Therapy Documentation Precautions:  Precautions Precautions: Fall Restrictions Weight Bearing  Restrictions: No  See Function Navigator for Current Functional Status.  Therapy/Group: Individual Therapy  AuLorie Phenix/30/2017, 12:50 PM

## 2015-12-06 NOTE — Progress Notes (Signed)
[place] PHYSICAL MEDICINE & REHABILITATION     PROGRESS NOTE  Subjective/Complaints:  Patient sitting up in bed this morning. He states that he felt a little confused after waking up.  ROS: Denies CP, SOB, N/V/D  Objective: Vital Signs: Blood pressure 100/64, pulse 76, temperature 98 F (36.7 C), temperature source Oral, resp. rate 18, height 5\' 8"  (1.727 m), weight 63.9 kg (140 lb 14 oz), SpO2 97 %. No results found.  Recent Labs  12/05/15 0447  WBC 10.0  HGB 14.0  HCT 41.4  PLT 294    Recent Labs  12/05/15 0447  NA 139  K 3.6  CL 106  GLUCOSE 93  BUN 20  CREATININE 1.52*  CALCIUM 8.9   CBG (last 3)  No results for input(s): GLUCAP in the last 72 hours.  Wt Readings from Last 3 Encounters:  11/28/15 63.9 kg (140 lb 14 oz)  11/22/15 64.1 kg (141 lb 5 oz)  03/31/15 67.586 kg (149 lb)    Physical Exam:  BP 100/64 mmHg  Pulse 76  Temp(Src) 98 F (36.7 C) (Oral)  Resp 18  Ht 5\' 8"  (1.727 m)  Wt 63.9 kg (140 lb 14 oz)  BMI 21.42 kg/m2  SpO2 97% Constitutional: He appears well-developed and well-nourished. NAD.  HENT: Normocephalic and atraumatic.  Eyes: Conjunctivae and EOM are normal.  Cardiovascular: Normal rate and regular rhythm.No murmur heard. Respiratory: Effort normal. No respiratory distress.  GI: Soft. Bowel sounds are normal. He exhibits no distension. There is no tenderness.  Musculoskeletal: He exhibits no edema or tenderness.  Neurological: He is alert.  Has poor safety awareness.  Mild ataxia with BUE.  A&Ox2 Motor: B/l UE: 5/5 proximal to distal B/l LE 5/5 proximal to distal  Skin: Skin is warm and dry. No rash noted. No erythema.  Psychiatric: His speech is tangential. Thought process is slowed. Cognition and memory are impaired.     Assessment/Plan: 1. 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 Physiatrist is providing close  team supervision and 24 hour management of active medical problems listed below. Physiatrist and rehab team continue to assess barriers to discharge/monitor patient progress toward functional and medical goals.  Function:  Bathing Bathing position Bathing activity did not occur:  (he only bathed arm pits, back, & face) Position: Sitting EOB  Bathing parts Body parts bathed by patient: Right arm, Left arm, Chest, Abdomen Body parts bathed by helper: Back  Bathing assist Assist Level: Set up, Supervision or verbal cues      Upper Body Dressing/Undressing Upper body dressing   What is the patient wearing?: Pull over shirt/dress, Button up shirt     Pull over shirt/dress - Perfomed by patient: Thread/unthread right sleeve, Thread/unthread left sleeve, Put head through opening, Pull shirt over trunk   Button up shirt - Perfomed by patient: Pull shirt around back, Thread/unthread left sleeve (he elected not to button his shirt)      Upper body assist Assist Level: Touching or steadying assistance(Pt > 75%)   Set up : To obtain clothing/put away  Lower Body Dressing/Undressing Lower body dressing Lower body dressing/undressing activity did not occur: Refused What is the patient wearing?: Ted Hose Underwear - Performed by patient: Thread/unthread right underwear leg, Thread/unthread left underwear leg, Pull underwear up/down   Pants- Performed by patient: Thread/unthread right pants leg, Pull pants up/down, Fasten/unfasten pants Pants- Performed by helper: Thread/unthread left pants leg Non-skid  slipper socks- Performed by patient: Don/doff right sock, Don/doff left sock                 TED Hose - Performed by helper: Don/doff right TED hose, Don/doff left TED hose  Lower body assist Assist for lower body dressing: Touching or steadying assistance (Pt > 75%)      Toileting Toileting Toileting activity did not occur: N/A Toileting steps completed by patient: Adjust clothing  prior to toileting, Performs perineal hygiene, Adjust clothing after toileting Toileting steps completed by helper: Performs perineal hygiene Toileting Assistive Devices: Grab bar or rail  Toileting assist Assist level: More than reasonable time, Supervision or verbal cues   Transfers Chair/bed transfer   Chair/bed transfer method: Stand pivot, Ambulatory Chair/bed transfer assist level: Supervision or verbal cues Chair/bed transfer assistive device: Patent attorney     Max distance: 200 Assist level: Touching or steadying assistance (Pt > 75%)   Wheelchair Wheelchair activity did not occur: N/A Type: Manual Max wheelchair distance: 235ft Assist Level: Touching or steadying assistance (Pt > 75%)  Cognition Comprehension Comprehension assist level: Understands basic 75 - 89% of the time/ requires cueing 10 - 24% of the time  Expression Expression assist level: Expresses basic 75 - 89% of the time/requires cueing 10 - 24% of the time. Needs helper to occlude trach/needs to repeat words.  Social Interaction Social Interaction assist level: Interacts appropriately 90% of the time - Needs monitoring or encouragement for participation or interaction.  Problem Solving Problem solving assist level: Solves basic 50 - 74% of the time/requires cueing 25 - 49% of the time  Memory Memory assist level: Recognizes or recalls 25 - 49% of the time/requires cueing 50 - 75% of the time    Medical Problem List and Plan: 1. Balance deficits and poor cognition secondary to left ICH.  Continue CIR  2. DVT Prophylaxis/Anticoagulation: Mechanical: Sequential compression devices, below knee Bilateral lower extremities 3. Pain Management: Tylenol prn for pain 4. Mood: LCSW to follow for evaluation and support.  5. Neuropsych: This patient is not fully capable of making decisions on his own behalf.  Appreciate neuropsych input  Will consider Nudexa 6. Skin/Wound Care: routine pressure  relief measures. 7. Fluids/Electrolytes/Nutrition: Monitor I/O.  8. H/o BPH/Urinary retention:    UA with rare bacteria on 3/23, urine culture with insignificant growth   Changed flomax to rapaflo adjusted for kidney function.   Bethanechol 10 added on 3/27, increased to 25 on 3/28  Urine culture With no growth on 3/28 9. Depression/PTSD/anxiety: On Zoloft daily with valium prn.  10. COPD: Uses albuterol daily due to "tightness' Also has issues with congestion.  Chest x-ray reviewed from 3/22, relatively unremarkable  Changed schedule nebs to when necessary on 3/23.  11. Headaches: Gets one "every afternoon".   Fioricet changed to Topamax 25 mg on 3/23, increased to 50 on 3/27.   Added Claritin and Flonase to help with nasal/sinus congestion.  12. Sundowning: wife reporting intermittent confusion worse in the evenings.   Continue to reorient and adjust sleep-wake cycle.  13. Prediabetes: Periodically monitor 14. AKI: continue to monitor. Avoid nephrotoxic meds  Creatinine 1.52 on 3/29  Encourage oral fluids 15: Hypokalemia: Resolved  3.6 on 3/29  Will continue to monitor  16. Sinusitis  Augmentin started 3/24-3/31  LOS (Days) 8 A FACE TO FACE EVALUATION WAS PERFORMED  Ankit Karis Juba 12/06/2015 9:54 AM

## 2015-12-07 ENCOUNTER — Inpatient Hospital Stay (HOSPITAL_COMMUNITY): Payer: Medicare Other | Admitting: Speech Pathology

## 2015-12-07 ENCOUNTER — Inpatient Hospital Stay (HOSPITAL_COMMUNITY): Payer: Medicare Other

## 2015-12-07 ENCOUNTER — Inpatient Hospital Stay (HOSPITAL_COMMUNITY): Payer: Medicare Other | Admitting: Physical Therapy

## 2015-12-07 LAB — CBC WITH DIFFERENTIAL/PLATELET
BASOS PCT: 0 %
Basophils Absolute: 0 10*3/uL (ref 0.0–0.1)
EOS ABS: 0.5 10*3/uL (ref 0.0–0.7)
EOS PCT: 5 %
HCT: 42.8 % (ref 39.0–52.0)
HEMOGLOBIN: 14.5 g/dL (ref 13.0–17.0)
Lymphocytes Relative: 22 %
Lymphs Abs: 2.1 10*3/uL (ref 0.7–4.0)
MCH: 30.7 pg (ref 26.0–34.0)
MCHC: 33.9 g/dL (ref 30.0–36.0)
MCV: 90.5 fL (ref 78.0–100.0)
Monocytes Absolute: 0.7 10*3/uL (ref 0.1–1.0)
Monocytes Relative: 7 %
NEUTROS PCT: 66 %
Neutro Abs: 6.6 10*3/uL (ref 1.7–7.7)
PLATELETS: 321 10*3/uL (ref 150–400)
RBC: 4.73 MIL/uL (ref 4.22–5.81)
RDW: 13.3 % (ref 11.5–15.5)
WBC: 9.9 10*3/uL (ref 4.0–10.5)

## 2015-12-07 LAB — BASIC METABOLIC PANEL
Anion gap: 10 (ref 5–15)
BUN: 17 mg/dL (ref 6–20)
CALCIUM: 8.9 mg/dL (ref 8.9–10.3)
CO2: 23 mmol/L (ref 22–32)
CREATININE: 1.66 mg/dL — AB (ref 0.61–1.24)
Chloride: 105 mmol/L (ref 101–111)
GFR, EST AFRICAN AMERICAN: 44 mL/min — AB (ref >60)
GFR, EST NON AFRICAN AMERICAN: 38 mL/min — AB (ref >60)
Glucose, Bld: 87 mg/dL (ref 65–99)
Potassium: 3.7 mmol/L (ref 3.5–5.1)
SODIUM: 138 mmol/L (ref 135–145)

## 2015-12-07 MED ORDER — DIAZEPAM 2 MG PO TABS
2.0000 mg | ORAL_TABLET | Freq: Two times a day (BID) | ORAL | Status: DC | PRN
  Administered 2015-12-07 – 2015-12-11 (×8): 2 mg via ORAL
  Filled 2015-12-07 (×8): qty 1

## 2015-12-07 NOTE — Progress Notes (Signed)
Physical Therapy Session Note  Patient Details  Name: Marcell BarlowDavid R Luck MRN: [id number] Date of Birth: 12/31/1934  Today's Date: 12/07/2015 PT Individual Time: 0800-0856 PT Individual Time Calculation (min): 56 min   Short Term Goals: Week 1:  PT Short Term Goal 1 (Week 1): STG = LTG due to short LOS  Skilled Therapeutic Interventions/Progress Updates:    Patient received sitting in WC eating breakfast, and agreeable to PT. Patient performed gait training in the hall for 13625ft with increased time, but no cues from PT with RW. Nustep training for LE and UE endurance for 10 minutes, level 4-6, PT provided min cues for increased use of BUE and proper speed of exercise.  Dynamic Balance training.  Obstacle course without AD x 3 including weave through 6 cones, gait over uneven surface, step over 3 canes and one bolster, Supervision A to CGA as needed with stepping over canes and bolster, cues for increased step length to avoid cones and bolster.  Step up to 6 inch step from level surface without UE support, x 10 each LE. with R and L LE Close supervision x 8, min A x 2 to prevent posterior LOB, as well as cues to decrease speed of movement to increase safety as well as increased step height.   Gait training without AD. Forward for 7130ft x 2, backward for 2830ft x 2, side step L and R 5330ft each direction.  Close supervision A - minA x 2 with backward gait to prevent LOB, as well as cues for decreased step cadence for improved safety.   Gait training with RW with supervision A cues for orientation for 15225ft back to room, Patient left supine in bed with call bell within reach and bed alarm set.    Therapy Documentation Precautions:  Precautions Precautions: Fall Restrictions Weight Bearing Restrictions: No General:   Vital Signs:    See Function Navigator for Current Functional Status.   Therapy/Group: Individual Therapy  Golden Popustin E Bettyjo Lundblad 12/07/2015, 12:32 PM

## 2015-12-07 NOTE — Progress Notes (Signed)
Occupational Therapy Session Note  Patient Details  Name: Marcell BarlowDavid R Havlik MRN: [id number] Date of Birth: 06/04/1935  Today's Date: 12/07/2015 OT Individual Time: [id number] OT Individual Time Calculation (min): 68 min   Short Term Goals: Week 2:  OT Short Term Goal 1 (Week 2): Pt will demo ability to attend to his wife's cues during functional transfers 100% of the time OT Short Term Goal 2 (Week 2): Pt will incorporate visual scanning strategies during functional task to locate 5 of 5 items placed within reach OT Short Term Goal 3 (Week 2): Pt will demo ability to manage catheter bag during functional transfers with supervision OT Short Term Goal 4 (Week 2): Pt will perform lower body dressing at sink with min assist to place catheter bag through pant leg  Skilled Therapeutic Interventions/Progress Updates: ADL-retraining at shower level with focus on improved awareness, attention, memory, and re-ed on bathing/dressing skills.  Pt received asleep in bed.   With mod assist to arouse and reorient to situation, pt requested phone contact with spouse as per plan to engage in family education session relating to assist with managing urinary device during BADL.   OT placed call for pt who required extra time to disengage from phone contact after discovering that spouse misunderstood session goals and remained at home.   Pt then proceeded with BADL as planned with only therapist assist.   Pt required steadying assist and vc to ambulate to shower and transfer to bench.  Once seated after setup to place catheter bag, pt completed BADL unassisted, standing to wash peri-area and buttocks, using grab bar for stability while standing ith min instructional cues.   Pt refuses to sit when cued while he washes each foot standing on one leg and therefore remains at fall risk during bathing.   Pt required total assist to shave in shower and elected to dress while sitting on the toilet d/t poor thermoregulation in cooler  room area.   Pt required assist to lace underwear and pants only and setup to don TEDs.   Overall, pt is greatly impaired by hearing loss and visual inattention during assisted BADL and he requires assist to manage urinary device (UD) and problem-solve.  Performance is also complicated by rigid behaviors, likely related to personality versus cognition, although pt is more compliant when pressed by authoritarian tone versus collaborative approach.  Cognitive impairments include short-term memory loss, inattention, impaired awareness, and problem-solving.     Therapy Documentation Precautions:  Precautions Precautions: Fall Restrictions Weight Bearing Restrictions: No  Pain: Pain Assessment Pain Assessment: No/denies pain  ADL: ADL ADL Comments: see Functional Assessment Tool  See Function Navigator for Current Functional Status.   Therapy/Group: Individual Therapy  Darol Cush 12/07/2015, 1:23 PM

## 2015-12-07 NOTE — Progress Notes (Signed)
[place] PHYSICAL MEDICINE & REHABILITATION     PROGRESS NOTE  Subjective/Complaints:  Patient seen sitting up in his chair this morning. He states he slept well. He states he feels "shaky" this morning with pronounced and tremor. However when distracted tremor resolves. He also mentions he is not sure ordered the food he is eating.  ROS: Denies CP, SOB, N/V/D  Objective: Vital Signs: Blood pressure 122/73, pulse 76, temperature 97.7 F (36.5 C), temperature source Oral, resp. rate 16, height 5\' 8"  (1.727 m), weight 63.9 kg (140 lb 14 oz), SpO2 96 %. No results found.  Recent Labs  12/05/15 0447 12/07/15 0520  WBC 10.0 9.9  HGB 14.0 14.5  HCT 41.4 42.8  PLT 294 321    Recent Labs  12/05/15 0447 12/07/15 0520  NA 139 138  K 3.6 3.7  CL 106 105  GLUCOSE 93 87  BUN 20 17  CREATININE 1.52* 1.66*  CALCIUM 8.9 8.9   CBG (last 3)  No results for input(s): GLUCAP in the last 72 hours.  Wt Readings from Last 3 Encounters:  11/28/15 63.9 kg (140 lb 14 oz)  11/22/15 64.1 kg (141 lb 5 oz)  03/31/15 67.586 kg (149 lb)    Physical Exam:  BP 122/73 mmHg  Pulse 76  Temp(Src) 97.7 F (36.5 C) (Oral)  Resp 16  Ht 5\' 8"  (1.727 m)  Wt 63.9 kg (140 lb 14 oz)  BMI 21.42 kg/m2  SpO2 96% Constitutional: He appears well-developed and well-nourished. NAD.  HENT: Normocephalic and atraumatic.  Eyes: Conjunctivae and EOM are normal.  Cardiovascular: Normal rate and regular rhythm.No murmur heard. Respiratory: Effort normal. No respiratory distress.  GI: Soft. Bowel sounds are normal. He exhibits no distension. There is no tenderness.  Musculoskeletal: He exhibits no edema or tenderness.  Neurological: He is alert.  Has poor safety awareness.  Mild ataxia with BUE.  A&Ox2 Motor: B/l UE: 5/5 proximal to distal B/l LE 5/5 proximal to distal  Skin: Skin is warm and dry. No rash noted. No erythema.  Psychiatric: His speech is tangential. Thought process is slowed. Cognition  and memory are impaired.     Assessment/Plan: 1. 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 Physiatrist is providing close team supervision and 24 hour management of active medical problems listed below. Physiatrist and rehab team continue to assess barriers to discharge/monitor patient progress toward functional and medical goals.  Function:  Bathing Bathing position Bathing activity did not occur:  (he only bathed arm pits, back, & face) Position: Sitting EOB  Bathing parts Body parts bathed by patient: Right arm, Left arm, Chest, Abdomen Body parts bathed by helper: Back  Bathing assist Assist Level: Set up, Supervision or verbal cues      Upper Body Dressing/Undressing Upper body dressing   What is the patient wearing?: Pull over shirt/dress, Button up shirt     Pull over shirt/dress - Perfomed by patient: Thread/unthread right sleeve, Thread/unthread left sleeve, Put head through opening, Pull shirt over trunk   Button up shirt - Perfomed by patient: Pull shirt around back, Thread/unthread left sleeve (he elected not to button his shirt)      Upper body assist Assist Level: Touching or steadying assistance(Pt > 75%)   Set up : To obtain clothing/put away  Lower Body Dressing/Undressing Lower body dressing Lower body dressing/undressing activity did not occur: Refused What is the patient wearing?: [facility]  Underwear - Performed by patient: Thread/unthread right underwear leg, Thread/unthread left underwear leg, Pull underwear up/down   Pants- Performed by patient: Thread/unthread right pants leg, Pull pants up/down, Fasten/unfasten pants Pants- Performed by helper: Thread/unthread left pants leg Non-skid slipper socks- Performed by patient: Don/doff right sock, Don/doff left sock                 TED Hose - Performed by helper: Don/doff right TED hose, Don/doff left  TED hose  Lower body assist Assist for lower body dressing: Touching or steadying assistance (Pt > 75%)      Toileting Toileting Toileting activity did not occur: N/A Toileting steps completed by patient: Adjust clothing prior to toileting, Adjust clothing after toileting Toileting steps completed by helper: Performs perineal hygiene Toileting Assistive Devices: Grab bar or rail  Toileting assist Assist level: More than reasonable time, Supervision or verbal cues   Transfers Chair/bed transfer   Chair/bed transfer method: Stand pivot Chair/bed transfer assist level: Set up only Chair/bed transfer assistive device: Armrests, Patent attorney     Max distance: 300 Assist level: Supervision or verbal cues   Wheelchair Wheelchair activity did not occur: N/A Type: Manual Max wheelchair distance: 238ft Assist Level: Touching or steadying assistance (Pt > 75%)  Cognition Comprehension Comprehension assist level: Understands basic 75 - 89% of the time/ requires cueing 10 - 24% of the time  Expression Expression assist level: Expresses basic 75 - 89% of the time/requires cueing 10 - 24% of the time. Needs helper to occlude trach/needs to repeat words.  Social Interaction Social Interaction assist level: Interacts appropriately 90% of the time - Needs monitoring or encouragement for participation or interaction.  Problem Solving Problem solving assist level: Solves basic 50 - 74% of the time/requires cueing 25 - 49% of the time  Memory Memory assist level: Recognizes or recalls 25 - 49% of the time/requires cueing 50 - 75% of the time    Medical Problem List and Plan: 1. Balance deficits and poor cognition secondary to left ICH.  Continue CIR  2. DVT Prophylaxis/Anticoagulation: Mechanical: Sequential compression devices, below knee Bilateral lower extremities 3. Pain Management: Tylenol prn for pain 4. Mood: LCSW to follow for evaluation and support.  5.  Neuropsych: This patient is not fully capable of making decisions on his own behalf.  Appreciate neuropsych input  Will consider Nudexa 6. Skin/Wound Care: routine pressure relief measures. 7. Fluids/Electrolytes/Nutrition: Monitor I/O.  8. H/o BPH/Urinary retention:    UA with rare bacteria on 3/23, urine culture with insignificant growth   Changed flomax to rapaflo adjusted for kidney function.   Bethanechol 10 added on 3/27, increased to 25 on 3/28  Urine culture With no growth on 3/28 9. Depression/PTSD/anxiety: On Zoloft daily with valium prn.  10. COPD: Uses albuterol daily due to "tightness' Also has issues with congestion.  Chest x-ray reviewed from 3/22, relatively unremarkable  Changed schedule nebs to when necessary on 3/23.  11. Headaches: Gets one "every afternoon".   Fioricet changed to Topamax 25 mg on 3/23, increased to 50 on 3/27.   Added Claritin and Flonase to help with nasal/sinus congestion.  12. Confusion:    Continue to reorient and adjust sleep-wake cycle.   Valium changed when necessary and dose decreased  Will DC Claritin 13. Prediabetes: Periodically monitor 14. AKI: continue to monitor. Avoid nephrotoxic meds  Creatinine 1.66 on 3/31  Encourage oral fluids 15: Hypokalemia: Resolved  3.7 on 3/31  Will continue  to monitor  16. Sinusitis  Augmentin 3/24-3/31  LOS (Days) 9 A FACE TO FACE EVALUATION WAS PERFORMED  Ermal Haberer Karis Juba 12/07/2015 9:58 AM

## 2015-12-07 NOTE — Progress Notes (Signed)
Speech Language Pathology Daily Session Note  Patient Details  Name: David BarlowDavid R Dittman MRN: [id number] Date of Birth: 07/27/1935  Today's Date: 12/07/2015 SLP Individual Time: 0900-1000 SLP Individual Time Calculation (min): 60 min  Short Term Goals: Week 2: SLP Short Term Goal 1 (Week 2): Pt to consistently demonstrate O x 4 with mod I.  SLP Short Term Goal 2 (Week 2): Pt to demonstrate recall of daily activities with min a questioning cues.  SLP Short Term Goal 3 (Week 2): Pt to demonstrate ability to manage meds with max A.  SLP Short Term Goal 3 - Progress (Week 2): Discontinued (comment) (pt verbalized awareness that this is no longer an option for him given visual deficits) SLP Short Term Goal 4 (Week 2): Pt to demonstrate ability sustain attention to task for 10 min with mod A.   Skilled Therapeutic Interventions: Pt seen for cognitive goals. Pt demonstrates emerging awareness of deficits. 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 Pt provided conflicting information re: responsibilities at home. Pt initially stated that he self-manages all meds, however when presented with a med management task, pt reported that he cannot complete med management secondary to his visual deficits. Will discontinue goal for medication management and include that as part of family training/responsibility. Pt verbalized agreement with spouse now managing meds. Max A required with simple coin counting due to limitations with working memory. Pt recognized difficulty and was able to problem solve a functional plan for managing his finances moving forward.    Function:  Eating Eating   Modified Consistency Diet: No Eating Assist Level: Set up assist for   Eating Set Up Assist For: Opening containers       Cognition Comprehension Comprehension assist level: Understands basic 75 - 89% of the time/ requires cueing 10 - 24% of  the time  Expression   Expression assist level: Expresses basic 75 - 89% of the time/requires cueing 10 - 24% of the time. Needs helper to occlude trach/needs to repeat words.  Social Interaction Social Interaction assist level: Interacts appropriately 90% of the time - Needs monitoring or encouragement for participation or interaction.  Problem Solving Problem solving assist level: Solves basic 50 - 74% of the time/requires cueing 25 - 49% of the time  Memory Memory assist level: Recognizes or recalls 25 - 49% of the time/requires cueing 50 - 75% of the time    Pain Pain Assessment Pain Assessment: No/denies pain  Therapy/Group: Individual Therapy  Rocky CraftsKara E Ilario Dhaliwal 12/07/2015, 12:48 PM

## 2015-12-08 ENCOUNTER — Encounter (HOSPITAL_COMMUNITY): Payer: Medicare Other | Admitting: Occupational Therapy

## 2015-12-08 NOTE — Progress Notes (Signed)
[facility] PHYSICAL MEDICINE & REHABILITATION     PROGRESS NOTE  Subjective/Complaints:  Asking about his prognosis from his stroke also asking about what he should be planning to do in the next few months.  ROS: Denies CP, SOB, N/V/D  Objective: Vital Signs: Blood pressure 109/76, pulse 73, temperature 97.5 F (36.4 C), temperature source Oral, resp. rate 18, height  (1.727 m), weight 63.9 kg (140 lb 14 oz), SpO2 94 %. No results found.  Recent Labs  12/07/15 0520  WBC 9.9  HGB 14.5  HCT 42.8  PLT 321    Recent Labs  12/07/15 0520  NA 138  K 3.7  CL 105  GLUCOSE 87  BUN 17  CREATININE 1.66*  CALCIUM 8.9   CBG (last 3)  No results for input(s): GLUCAP in the last 72 hours.  Wt Readings from Last 3 Encounters:  11/28/15 63.9 kg (140 lb 14 oz)  11/22/15 64.1 kg (141 lb 5 oz)  03/31/15 67.586 kg (149 lb)    Physical Exam:  BP 109/76 mmHg  Pulse 73  Temp(Src) 97.5 F (36.4 C) (Oral)  Resp 18  Ht  (1.727 m)  Wt 63.9 kg (140 lb 14 oz)  BMI 21.42 kg/m2  SpO2 94% Constitutional: He appears well-developed and well-nourished. NAD.  HENT: Normocephalic and atraumatic.  Eyes: Conjunctivae and EOM are normal.  Cardiovascular: Normal rate and regular rhythm.No murmur heard. Respiratory: Effort normal. No respiratory distress.  GI: Soft. Bowel sounds are normal. He exhibits no distension. There is no tenderness.  Musculoskeletal: He exhibits no edema or tenderness.  Neurological: He is alert.  Has poor safety awareness.  Mild ataxia with BUE.  A&Ox2 Motor: B/l UE: 5/5 proximal to distal B/l LE 5/5 proximal to distal  Skin: Skin is warm and dry. No rash noted. No erythema.  Psychiatric: His speech is tangential. Thought process is slowed. Cognition and memory are impaired.     Assessment/Plan: 1. Functional deficits secondary to left ICH which require 3+ hours per day of interdisciplinary therapy in a comprehensive inpatient rehab  setting. Physiatrist is providing close team supervision and 24 hour management of active medical problems listed below. Physiatrist and rehab team continue to assess barriers to discharge/monitor patient progress toward functional and medical goals.  Function:  Bathing Bathing position Bathing activity did not occur:  (he only bathed arm pits, back, & face) Position: Shower  Bathing parts Body parts bathed by patient: Right arm, Left arm, Chest, Abdomen, Front perineal area, Buttocks, Right upper leg, Left upper leg, Right lower leg, Left lower leg Body parts bathed by helper: Back  Bathing assist Assist Level: Set up, Supervision or verbal cues, Touching or steadying assistance(Pt > 75%)   Set up :  (To manage UD)  Upper Body Dressing/Undressing Upper body dressing   What is the patient wearing?: Pull over shirt/dress, Button up shirt     Pull over shirt/dress - Perfomed by patient: Thread/unthread right sleeve, Thread/unthread left sleeve, Put head through opening, Pull shirt over trunk   Button up shirt - Perfomed by patient: Thread/unthread right sleeve, Thread/unthread left sleeve, Pull shirt around back Button up shirt - Perfomed by helper: Button/unbutton shirt    Upper body assist Assist Level: Touching or steadying assistance(Pt > 75%)   Set up : To obtain clothing/put away  Lower Body Dressing/Undressing Lower body dressing Lower body dressing/undressing activity did not occur: Refused What is the patient wearing?: Underwear, Pants, [employer], Non-skid slipper socks Underwear -  Performed by patient: Thread/unthread left underwear leg, Pull underwear up/down Underwear - Performed by helper: Thread/unthread right underwear leg Pants- Performed by patient: Thread/unthread left pants leg, Pull pants up/down, Fasten/unfasten pants Pants- Performed by helper: Thread/unthread right pants leg Non-skid slipper socks- Performed by patient: Don/doff right sock, Don/doff left sock                  TED Hose - Performed by helper: Don/doff right TED hose, Don/doff left TED hose  Lower body assist Assist for lower body dressing: Touching or steadying assistance (Pt > 75%)      Toileting Toileting Toileting activity did not occur: N/A Toileting steps completed by patient: Adjust clothing prior to toileting, Adjust clothing after toileting Toileting steps completed by helper: Performs perineal hygiene Toileting Assistive Devices: Grab bar or rail  Toileting assist Assist level: Supervision or verbal cues   Transfers Chair/bed transfer   Chair/bed transfer method: Ambulatory Chair/bed transfer assist level: Supervision or verbal cues Chair/bed transfer assistive device: Patent attorneyWalker     Locomotion Ambulation     Max distance: 125 Assist level: Touching or steadying assistance (Pt > 75%)   Wheelchair Wheelchair activity did not occur: N/A Type: Manual Max wheelchair distance: 25800ft Assist Level: Touching or steadying assistance (Pt > 75%)  Cognition Comprehension Comprehension assist level: Understands basic 75 - 89% of the time/ requires cueing 10 - 24% of the time  Expression Expression assist level: Expresses basic 75 - 89% of the time/requires cueing 10 - 24% of the time. Needs helper to occlude trach/needs to repeat words.  Social Interaction Social Interaction assist level: Interacts appropriately 90% of the time - Needs monitoring or encouragement for participation or interaction.  Problem Solving Problem solving assist level: Solves basic 50 - 74% of the time/requires cueing 25 - 49% of the time  Memory Memory assist level: Recognizes or recalls 25 - 49% of the time/requires cueing 50 - 75% of the time    Medical Problem List and Plan: 1. Balance deficits and poor cognition secondary to left ICH.  Continue CIR , Discussed that he will likely need home health or outpatient rehabilitation for the next 2-3 months, His cognitive improvements may occur over  the next 9-12 months.2. DVT Prophylaxis/Anticoagulation: Mechanical: Sequential compression devices, below knee Bilateral lower extremities 3. Pain Management: Tylenol prn for pain 4. Mood: LCSW to follow for evaluation and support.  5. Neuropsych: This patient is not fully capable of making decisions on his own behalf.  Appreciate neuropsych input  Will consider Nudexa 6. Skin/Wound Care: routine pressure relief measures. 7. Fluids/Electrolytes/Nutrition: Monitor I/O.  8. H/o BPH/Urinary retention:    UA with rare bacteria on 3/23, urine culture with insignificant growth   Changed flomax to rapaflo adjusted for kidney function.   Bethanechol 10 added on 3/27, increased to 25 on 3/28  Urine culture With no growth on 3/28 9. Depression/PTSD/anxiety: On Zoloft daily with valium prn.  10. COPD: Uses albuterol daily due to "tightness' Also has issues with congestion.  Chest x-ray reviewed from 3/22, relatively unremarkable  Changed schedule nebs to when necessary on 3/23.  11. Headaches: No current complaints   Fioricet changed to Topamax 25 mg on 3/23, increased to 50 on 3/27.   Added Claritin and Flonase to help with nasal/sinus congestion.  12. Confusion:    Continue to reorient and adjust sleep-wake cycle.   Valium changed when necessary and dose decreased  Will DC Claritin 13. Prediabetes: Periodically monitor 14. AKI: continue to  monitor. Avoid nephrotoxic meds  Creatinine 1.66 on 3/31  Encourage oral fluids 15: Hypokalemia: Resolved  3.7 on 3/31  Will continue to monitor  16. Sinusitis  Augmentin 3/24-3/31, Will monitor for recurrence of symptoms  LOS (Days) 10 A FACE TO FACE EVALUATION WAS PERFORMED  Erick Colace 12/08/2015 10:49 AM

## 2015-12-08 NOTE — Progress Notes (Signed)
Occupational Therapy Session Note  Patient Details  Name: David Duke MRN: [id number] Date of Birth: 01/21/1935  Today's Date: 12/08/2015 OT Missed Time: 60 Minutes Missed Time Reason: Patient ill (comment)   Short Term Goals: Week 2:  OT Short Term Goal 1 (Week 2): Pt will demo ability to attend to his wife's cues during functional transfers 100% of the time OT Short Term Goal 2 (Week 2): Pt will incorporate visual scanning strategies during functional task to locate 5 of 5 items placed within reach OT Short Term Goal 3 (Week 2): Pt will demo ability to manage catheter bag during functional transfers with supervision OT Short Term Goal 4 (Week 2): Pt will perform lower body dressing at sink with min assist to place catheter bag through pant leg  Skilled Therapeutic Interventions/Progress Updates:    Pt scheduled for group therapy this session. Pt in bed asleep. Pt's wife very teary stating pt is not feeling well and was refusing to eat. Pt's wife stated pt not feeling well enough to participate.  Did not attempt to wake pt.  Discussed with wife bringing in a few food items pt enjoys.    Therapy Documentation Precautions:  Precautions Precautions: Fall Restrictions Weight Bearing Restrictions: No       ADL: ADL ADL Comments: see Functional Assessment Tool  See Function Navigator for Current Functional Status.   Therapy/Group: Group Therapy  David Duke 12/08/2015, 12:43 PM

## 2015-12-09 ENCOUNTER — Inpatient Hospital Stay (HOSPITAL_COMMUNITY): Payer: Medicare Other | Admitting: Physical Therapy

## 2015-12-09 NOTE — Progress Notes (Signed)
[place] PHYSICAL MEDICINE & REHABILITATION     PROGRESS NOTE  Subjective/Complaints:  No new issues overnight, he states he like to travel but is not sure when he could do this again.  ROS: Denies CP, SOB, N/V/D  Objective: Vital Signs: Blood pressure 120/72, pulse 81, temperature 98 F (36.7 C), temperature source Oral, resp. rate 16, height  (1.727 m), weight 60.2 kg (132 lb 11.5 oz), SpO2 97 %. No results found.  Recent Labs  12/07/15 0520  WBC 9.9  HGB 14.5  HCT 42.8  PLT 321    Recent Labs  12/07/15 0520  NA 138  K 3.7  CL 105  GLUCOSE 87  BUN 17  CREATININE 1.66*  CALCIUM 8.9   CBG (last 3)  No results for input(s): GLUCAP in the last 72 hours.  Wt Readings from Last 3 Encounters:  12/09/15 60.2 kg (132 lb 11.5 oz)  11/22/15 64.1 kg (141 lb 5 oz)  03/31/15 67.586 kg (149 lb)    Physical Exam:  BP 120/72 mmHg  Pulse 81  Temp(Src) 98 F (36.7 C) (Oral)  Resp 16  Ht  (1.727 m)  Wt 60.2 kg (132 lb 11.5 oz)  BMI 20.18 kg/m2  SpO2 97% Constitutional: He appears well-developed and well-nourished. NAD.  HENT: Normocephalic and atraumatic.  Eyes: Conjunctivae and EOM are normal.  Cardiovascular: Normal rate and regular rhythm.No murmur heard. Respiratory: Effort normal. No respiratory distress.  GI: Soft. Bowel sounds are normal. He exhibits no distension. There is no tenderness.  Musculoskeletal: He exhibits no edema or tenderness.  Neurological: He is alert.  Has poor safety awareness.  Mild ataxia with BUE.  A&Ox2 Motor: B/l UE: 5/5 proximal to distal B/l LE 5/5 proximal to distal  Skin: Skin is warm and dry. No rash noted. No erythema.  Psychiatric: His speech is tangential. Thought process is slowed. Cognition and memory are impaired.     Assessment/Plan: 1. 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 Physiatrist is providing  close team supervision and 24 hour management of active medical problems listed below. Physiatrist and rehab team continue to assess barriers to discharge/monitor patient progress toward functional and medical goals.  Function:  Bathing Bathing position Bathing activity did not occur:  (he only bathed arm pits, back, & face) Position: Shower  Bathing parts Body parts bathed by patient: Right arm, Left arm, Chest, Abdomen, Front perineal area, Buttocks, Right upper leg, Left upper leg, Right lower leg, Left lower leg Body parts bathed by helper: Back  Bathing assist Assist Level: Set up, Supervision or verbal cues, Touching or steadying assistance(Pt > 75%)   Set up :  (To manage UD)  Upper Body Dressing/Undressing Upper body dressing   What is the patient wearing?: Pull over shirt/dress, Button up shirt     Pull over shirt/dress - Perfomed by patient: Thread/unthread right sleeve, Thread/unthread left sleeve, Put head through opening, Pull shirt over trunk   Button up shirt - Perfomed by patient: Thread/unthread right sleeve, Thread/unthread left sleeve, Pull shirt around back Button up shirt - Perfomed by helper: Button/unbutton shirt    Upper body assist Assist Level: Touching or steadying assistance(Pt > 75%)   Set up : To obtain clothing/put away  Lower Body Dressing/Undressing Lower body dressing Lower body dressing/undressing activity did not occur: Refused What is the patient wearing?: Underwear, Pants, Ted Hose, Non-skid slipper socks Underwear - Performed by  patient: Thread/unthread left underwear leg, Pull underwear up/down Underwear - Performed by helper: Thread/unthread right underwear leg Pants- Performed by patient: Thread/unthread left pants leg, Pull pants up/down, Fasten/unfasten pants Pants- Performed by helper: Thread/unthread right pants leg Non-skid slipper socks- Performed by patient: Don/doff right sock, Don/doff left sock                 TED Hose -  Performed by helper: Don/doff right TED hose, Don/doff left TED hose  Lower body assist Assist for lower body dressing: Touching or steadying assistance (Pt > 75%)      Toileting Toileting Toileting activity did not occur: N/A Toileting steps completed by patient: Adjust clothing prior to toileting, Adjust clothing after toileting Toileting steps completed by helper: Adjust clothing prior to toileting Toileting Assistive Devices: Grab bar or rail  Toileting assist Assist level: Supervision or verbal cues   Transfers Chair/bed transfer   Chair/bed transfer method: Ambulatory Chair/bed transfer assist level: Supervision or verbal cues Chair/bed transfer assistive device: Patent attorneyWalker     Locomotion Ambulation     Max distance: 125 Assist level: Touching or steadying assistance (Pt > 75%)   Wheelchair Wheelchair activity did not occur: N/A Type: Manual Max wheelchair distance: 27500ft Assist Level: Touching or steadying assistance (Pt > 75%)  Cognition Comprehension Comprehension assist level: Understands basic 75 - 89% of the time/ requires cueing 10 - 24% of the time  Expression Expression assist level: Expresses basic 75 - 89% of the time/requires cueing 10 - 24% of the time. Needs helper to occlude trach/needs to repeat words.  Social Interaction Social Interaction assist level: Interacts appropriately 90% of the time - Needs monitoring or encouragement for participation or interaction.  Problem Solving Problem solving assist level: Solves basic 50 - 74% of the time/requires cueing 25 - 49% of the time  Memory Memory assist level: Recognizes or recalls 25 - 49% of the time/requires cueing 50 - 75% of the time    Medical Problem List and Plan: 1. Balance deficits and poor cognition secondary to left ICH.  Continue CIR , Discussed that he will likely need home health or outpatient rehabilitation for the next 2-3 months, His cognitive improvements may occur over the next 9-12 months.2.  DVT Prophylaxis/Anticoagulation: Mechanical: Sequential compression devices, below knee Bilateral lower extremities 3. Pain Management: Tylenol prn for pain 4. Mood: LCSW to follow for evaluation and support.  5. Neuropsych: This patient is not fully capable of making decisions on his own behalf.  Appreciate neuropsych input  Will consider Nudexa 6. Skin/Wound Care: routine pressure relief measures. 7. Fluids/Electrolytes/Nutrition: Monitor I/O.  8. H/o BPH/Urinary retention:    UA with rare bacteria on 3/23, urine culture with insignificant growth   Changed flomax to rapaflo adjusted for kidney function.   Bethanechol 10 added on 3/27, increased to 25 on 3/28  Urine culture With no growth on 3/28 9. Depression/PTSD/anxiety: On Zoloft daily with valium prn.  10. COPD: Uses albuterol daily due to "tightness' Also has issues with congestion.  Chest x-ray reviewed from 3/22, relatively unremarkable  Changed schedule nebs to when necessary on 3/23.  11. Headaches: Improved  Fioricet changed to Topamax 25 mg on 3/23, increased to 50 on 3/27.   Added Claritin and Flonase to help with nasal/sinus congestion.  12. Confusion:    Continue to reorient and adjust sleep-wake cycle.   Valium changed when necessary and dose decreased  Will DC Claritin 13. Prediabetes: Periodically monitor 14. AKI: continue to monitor. Avoid nephrotoxic  meds, Basic metabolic package in a.m.  Creatinine 1.66 on 3/31  Encourage oral fluids 15: Hypokalemia: Resolved  3.7 on 3/31  Will continue to monitor  16. Sinusitis  Augmentin 3/24-3/31, No evidence of recurrence, CBC in a.m.  LOS (Days) 11 A FACE TO FACE EVALUATION WAS PERFORMED  Claudette Laws E 12/09/2015 9:53 AM

## 2015-12-09 NOTE — Progress Notes (Signed)
Occupational Therapy Session Note  Patient Details  Name: David BarlowDavid R Duke MRN: [id number] Date of Birth: 07/02/1935  Today's Date: 12/09/2015 OT Individual Time: [id number] OT Individual Time Calculation (min): 60 min    Skilled Therapeutic Interventions/Progress Updates: patient participated in OT today as follows:    orienation & situational orientationl; focus and attention to task; short and long term memory as it relates to current situation and leisure activities for the patient.   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  He participated as well, in Endurance activities; standing balance.    Patient c/o nausea and needing to urgently have BM and was assisted to toilet (toilet transfer) via w/c and grab bar with close supervsioion.   Then he was handed off to nursing staff to supervise while he toileted and for RN to assess his complaints of nausea and urgent need to have BM.     Therapy Documentation Precautions:  Precautions Precautions: Fall Restrictions Weight Bearing Restrictions: No  Pain:head ache - did not rate but stated, "I do not want to take anything yet."     See Function Navigator for Current Functional Status.   Therapy/Group: Individual Therapy  Bud Faceickett, Leiloni Smithers [facility] 12/09/2015, 6:14 PM

## 2015-12-10 ENCOUNTER — Inpatient Hospital Stay (HOSPITAL_COMMUNITY): Payer: Medicare Other | Admitting: Speech Pathology

## 2015-12-10 ENCOUNTER — Inpatient Hospital Stay (HOSPITAL_COMMUNITY): Payer: Medicare Other

## 2015-12-10 ENCOUNTER — Inpatient Hospital Stay (HOSPITAL_COMMUNITY): Payer: Medicare Other | Admitting: Physical Therapy

## 2015-12-10 LAB — BASIC METABOLIC PANEL
Anion gap: 10 (ref 5–15)
BUN: 16 mg/dL (ref 6–20)
CO2: 23 mmol/L (ref 22–32)
CREATININE: 1.61 mg/dL — AB (ref 0.61–1.24)
Calcium: 9.2 mg/dL (ref 8.9–10.3)
Chloride: 107 mmol/L (ref 101–111)
GFR, EST AFRICAN AMERICAN: 46 mL/min — AB (ref >60)
GFR, EST NON AFRICAN AMERICAN: 39 mL/min — AB (ref >60)
Glucose, Bld: 100 mg/dL — ABNORMAL HIGH (ref 65–99)
POTASSIUM: 3.7 mmol/L (ref 3.5–5.1)
SODIUM: 140 mmol/L (ref 135–145)

## 2015-12-10 LAB — CBC WITH DIFFERENTIAL/PLATELET
Basophils Absolute: 0 10*3/uL (ref 0.0–0.1)
Basophils Relative: 0 %
EOS ABS: 0.5 10*3/uL (ref 0.0–0.7)
EOS PCT: 5 %
HCT: 41.5 % (ref 39.0–52.0)
HEMOGLOBIN: 13.5 g/dL (ref 13.0–17.0)
LYMPHS ABS: 2.5 10*3/uL (ref 0.7–4.0)
LYMPHS PCT: 27 %
MCH: 29.2 pg (ref 26.0–34.0)
MCHC: 32.5 g/dL (ref 30.0–36.0)
MCV: 89.6 fL (ref 78.0–100.0)
MONOS PCT: 6 %
Monocytes Absolute: 0.5 10*3/uL (ref 0.1–1.0)
Neutro Abs: 5.6 10*3/uL (ref 1.7–7.7)
Neutrophils Relative %: 62 %
PLATELETS: 296 10*3/uL (ref 150–400)
RBC: 4.63 MIL/uL (ref 4.22–5.81)
RDW: 13.2 % (ref 11.5–15.5)
WBC: 9.1 10*3/uL (ref 4.0–10.5)

## 2015-12-10 LAB — CBC AND DIFFERENTIAL: WBC: 9.1 10^3/mL

## 2015-12-10 NOTE — Progress Notes (Signed)
[place] PHYSICAL MEDICINE & REHABILITATION     PROGRESS NOTE  Subjective/Complaints:  Patient seen working with OT this morning. He notes that he has nausea over the weekend but thinks it is secondary of food he 8. His wife is concerned about his discharge home. She mentions his outpatient neurologist is Dr. Di Kindle.   ROS: Denies CP, SOB, N/V/D  Objective: Vital Signs: Blood pressure 136/79, pulse 73, temperature 98.2 F (36.8 C), temperature source Oral, resp. rate 18, height  (1.727 m), weight 60 kg (132 lb 4.4 oz), SpO2 97 %. No results found.  Recent Labs  12/10/15 0555  WBC 9.1  HGB 13.5  HCT 41.5  PLT 296    Recent Labs  12/10/15 0555  NA 140  K 3.7  CL 107  GLUCOSE 100*  BUN 16  CREATININE 1.61*  CALCIUM 9.2   CBG (last 3)  No results for input(s): GLUCAP in the last 72 hours.  Wt Readings from Last 3 Encounters:  12/10/15 60 kg (132 lb 4.4 oz)  11/22/15 64.1 kg (141 lb 5 oz)  03/31/15 67.586 kg (149 lb)    Physical Exam:  BP 136/79 mmHg  Pulse 73  Temp(Src) 98.2 F (36.8 C) (Oral)  Resp 18  Ht  (1.727 m)  Wt 60 kg (132 lb 4.4 oz)  BMI 20.12 kg/m2  SpO2 97% Constitutional: He appears well-developed and well-nourished. NAD.  HENT: Normocephalic and atraumatic.  Eyes: Conjunctivae and EOM are normal.  Cardiovascular: Normal rate and regular rhythm.No murmur heard. Respiratory: Effort normal. No respiratory distress.  GI: Soft. Bowel sounds are normal. He exhibits no distension. There is no tenderness.  Musculoskeletal: He exhibits no edema or tenderness.  Neurological: He is alert.  Has poor safety awareness.  Mild ataxia with BUE.  A&Ox2 Motor: B/l UE: 5/5 proximal to distal B/l LE 5/5 proximal to distal  Skin: Skin is warm and dry. No rash noted. No erythema.  Psychiatric: His speech is tangential. Thought process is slowed. Cognition and memory are impaired.     Assessment/Plan: 1. Functional deficits secondary  to left ICH which require 3+ hours per day of interdisciplinary therapy in a comprehensive inpatient rehab setting. Physiatrist is providing close team supervision and 24 hour management of active medical problems listed below. Physiatrist and rehab team continue to assess barriers to discharge/monitor patient progress toward functional and medical goals.  Function:  Bathing Bathing position Bathing activity did not occur:  (he only bathed arm pits, back, & face) Position: Shower  Bathing parts Body parts bathed by patient: Right arm, Left arm, Chest, Abdomen, Front perineal area, Buttocks, Right upper leg, Left upper leg, Right lower leg, Left lower leg Body parts bathed by helper: Back  Bathing assist Assist Level: Set up, Supervision or verbal cues, Touching or steadying assistance(Pt > 75%)   Set up :  (To manage UD)  Upper Body Dressing/Undressing Upper body dressing   What is the patient wearing?: Pull over shirt/dress, Button up shirt     Pull over shirt/dress - Perfomed by patient: Thread/unthread right sleeve, Thread/unthread left sleeve, Put head through opening, Pull shirt over trunk   Button up shirt - Perfomed by patient: Thread/unthread right sleeve, Thread/unthread left sleeve, Pull shirt around back Button up shirt - Perfomed by helper: Button/unbutton shirt    Upper body assist Assist Level: Touching or steadying assistance(Pt > 75%)   Set up : To obtain clothing/put away  Lower Body Dressing/Undressing Lower body dressing Lower  body dressing/undressing activity did not occur: Refused What is the patient wearing?: Underwear, Pants, Ted Hose, Non-skid slipper socks Underwear - Performed by patient: Thread/unthread left underwear leg, Pull underwear up/down Underwear - Performed by helper: Thread/unthread right underwear leg Pants- Performed by patient: Thread/unthread left pants leg, Pull pants up/down, Fasten/unfasten pants Pants- Performed by helper: Thread/unthread  right pants leg Non-skid slipper socks- Performed by patient: Don/doff right sock, Don/doff left sock                 TED Hose - Performed by helper: Don/doff right TED hose, Don/doff left TED hose  Lower body assist Assist for lower body dressing: Touching or steadying assistance (Pt > 75%)      Toileting Toileting Toileting activity did not occur: N/A Toileting steps completed by patient: Adjust clothing prior to toileting, Adjust clothing after toileting Toileting steps completed by helper: Adjust clothing prior to toileting Toileting Assistive Devices: Grab bar or rail  Toileting assist Assist level: Supervision or verbal cues   Transfers Chair/bed transfer   Chair/bed transfer method: Ambulatory Chair/bed transfer assist level: Supervision or verbal cues Chair/bed transfer assistive device: Patent attorney     Max distance: 125 Assist level: Touching or steadying assistance (Pt > 75%)   Wheelchair Wheelchair activity did not occur: N/A Type: Manual Max wheelchair distance: 221ft Assist Level: Touching or steadying assistance (Pt > 75%)  Cognition Comprehension Comprehension assist level: Understands basic 75 - 89% of the time/ requires cueing 10 - 24% of the time  Expression Expression assist level: Expresses basic 75 - 89% of the time/requires cueing 10 - 24% of the time. Needs helper to occlude trach/needs to repeat words.  Social Interaction Social Interaction assist level: Interacts appropriately 90% of the time - Needs monitoring or encouragement for participation or interaction.  Problem Solving Problem solving assist level: Solves basic 50 - 74% of the time/requires cueing 25 - 49% of the time  Memory Memory assist level: Recognizes or recalls 25 - 49% of the time/requires cueing 50 - 75% of the time    Medical Problem List and Plan: 1. Balance deficits and poor cognition secondary to left ICH.  Continue CIR  2. DVT  Prophylaxis/Anticoagulation: Mechanical: Sequential compression devices, below knee Bilateral lower extremities 3. Pain Management: Tylenol prn for pain 4. Mood: LCSW to follow for evaluation and support.  5. Neuropsych: This patient is not fully capable of making decisions on his own behalf.  Appreciate neuropsych input  Will consider Nudexa 6. Skin/Wound Care: routine pressure relief measures. 7. Fluids/Electrolytes/Nutrition: Monitor I/O.  8. H/o BPH/Urinary retention:    UA with rare bacteria on 3/23, urine culture with insignificant growth   Changed flomax to rapaflo adjusted for kidney function.   Bethanechol 10 added on 3/27, increased to 25 on 3/28  Urine culture With no growth on 3/28 9. Depression/PTSD/anxiety: On Zoloft daily with valium prn.  10. COPD: Uses albuterol daily due to "tightness' Also has issues with congestion.  Chest x-ray reviewed from 3/22, relatively unremarkable  Changed schedule nebs to when necessary on 3/23.  11. Headaches: Improved  Fioricet changed to Topamax 25 mg on 3/23, increased to 50 on 3/27.   Added Claritin and Flonase to help with nasal/sinus congestion.  12. Confusion:    Continue to reorient and adjust sleep-wake cycle.   Valium changed when necessary and dose decreased  Will DC Claritin 13. Prediabetes: Periodically monitor 14. AKI: continue to monitor. Avoid nephrotoxic meds.  Creatinine 1.61 on 4/3  Encourage oral fluids 15: Hypokalemia: Resolved  3.7 on 4/3  Will continue to monitor  16. Sinusitis  Augmentin 3/24-3/31  LOS (Days) 12 A FACE TO FACE EVALUATION WAS PERFORMED  Gibson Lad Karis Jubanil Kunta Hilleary 12/10/2015 9:21 AM

## 2015-12-10 NOTE — Progress Notes (Signed)
Social Work Patient ID: David Duke, male   DOB: [birth year], 81 y.o.   MRN: [id number] Met with pt and wife to discuss discharge for tomorrow. Questions regarding discharge time and what to expect. Wife reports he has behaved today and she feels if he is like this she can  Manage him. He feels he will be more relaxed and glad to be home. Will see tomorrow when here to answer any last minute questions. Wife knows it may be difficult but she wants to try.

## 2015-12-10 NOTE — Progress Notes (Signed)
Occupational Therapy Session Note  Patient Details  Name: David Duke David BarlowMRN: [id number] Date of Birth: 06/05/1935  Today's Date: 12/10/2015 OT Individual Time: 4098-11910730-0830 OT Individual Time Calculation (min): 60 min    Short Term Goals: Week 2:  OT Short Term Goal 1 (Week 2): Pt will demo ability to attend to his wife's cues during functional transfers 100% of the time OT Short Term Goal 2 (Week 2): Pt will incorporate visual scanning strategies during functional task to locate 5 of 5 items placed within reach OT Short Term Goal 3 (Week 2): Pt will demo ability to manage catheter bag during functional transfers with supervision OT Short Term Goal 4 (Week 2): Pt will perform lower body dressing at sink with min assist to place catheter bag through pant leg  Skilled Therapeutic Interventions/Progress Updates: ADL-retraining with focus on family edcuation (spouse present) relating to assistance required during performance of BADL (functional mobility, shower, dress) and recommended strategies to compensate for pt's cognitive impairments.   With mod instructional cues and close supervision, spouse assists pt with clothing management during toileting, transfers, bathing, dressing, and safety awareness.   Pt completes all tasks with overall min assist d/t his inability to manage catheter bag.   OT advised request of leg bag to simplify management issues.   Spouse required repeated direction to maintain control of pt's actions using simplified choices and redirection of pt versus allowing pt to direct caregiver however familial relationship appeared more complicated than could be resolved.   OT educated spouse on use of door alarms and maintaining a schedule of activities on a calendar to provide consistency at home.   Pt left with spouse attending to all needs at end of session.     Therapy Documentation Precautions:  Precautions Precautions: Fall Restrictions Weight Bearing Restrictions:  No  Pain: Pain Assessment Pain Score: 0-No pain   ADL: ADL ADL Comments: see Functional Assessment Tool   See Function Navigator for Current Functional Status.   Therapy/Group: Individual Therapy  Alaster Asfaw 12/10/2015, 10:58 AM

## 2015-12-10 NOTE — Plan of Care (Signed)
Problem: RH Bed Mobility Goal: LTG Patient will perform bed mobility with assist (PT) LTG: Patient will perform bed mobility with assistance, with/without cues (PT).  Outcome: Completed/Met Date Met:  12/10/15 With assistance for managing catheter bag; without hospital bed features  Problem: RH Bed to Chair Transfers Goal: LTG Patient will perform bed/chair transfers w/assist (PT) LTG: Patient will perform bed/chair transfers with assistance, with/without cues (PT).  Outcome: Completed/Met Date Met:  12/10/15 With RW  Problem: RH Car Transfers Goal: LTG Patient will perform car transfers with assist (PT) LTG: Patient will perform car transfers with assistance (PT).  Outcome: Completed/Met Date Met:  12/10/15 With RW  Problem: RH Ambulation Goal: LTG Patient will ambulate in controlled environment (PT) LTG: Patient will ambulate in a controlled environment, # of feet with assistance (PT).  Outcome: Completed/Met Date Met:  12/10/15 300 ft with RW Goal: LTG Patient will ambulate in home environment (PT) LTG: Patient will ambulate in home environment, # of feet with assistance (PT).  Outcome: Completed/Met Date Met:  12/10/15 With RW Goal: LTG Patient will ambulate in community environment (PT) LTG: Patient will ambulate in community environment, # of feet with assistance (PT).  Outcome: Completed/Met Date Met:  12/10/15 With RW  Problem: RH Stairs Goal: LTG Patient will ambulate up and down stairs w/assist (PT) LTG: Patient will ambulate up and down # of stairs with assistance (PT)  Outcome: Completed/Met Date Met:  12/10/15 12 steps B rails

## 2015-12-10 NOTE — Progress Notes (Signed)
Speech Language Pathology Daily Session Note  Patient Details  Name: David BarlowDavid R Bencosme MRN: [id number] Date of Birth: 06/27/1935  Today's Date: 12/10/2015 SLP Individual Time: 1005-1100 SLP Individual Time Calculation (min): 55 min  Short Term Goals: Week 2: SLP Short Term Goal 1 (Week 2): Pt to consistently demonstrate O x 4 with mod I.  SLP Short Term Goal 2 (Week 2): Pt to demonstrate recall of daily activities with min a questioning cues.  SLP Short Term Goal 3 (Week 2): Pt to demonstrate ability to manage meds with max A.  SLP Short Term Goal 3 - Progress (Week 2): Discontinued (comment) (pt verbalized awareness that this is no longer an option for him given visual deficits) SLP Short Term Goal 4 (Week 2): Pt to demonstrate ability sustain attention to task for 10 min with mod A.   Skilled Therapeutic Interventions:    Pt was seen for skilled ST targeting cognitive goals.  Pt's wife was present during today's therapy session.  SLP provided skilled education with demonstration regarding pt's current cognitive limitations and their impact on his safety in the home environment.   SLP recommended that pt have 24/7 supervision as well as assistance for medication and financial management at discharge due to his decreased problem solving, recall of new information, awareness of deficits, and visual impairments.  Pt and pt's wife verbalized understanding of recommendations.  SLP also recommended ongoing ST follow up at discharge to continue to address cognitive deficits.  SLP also discussed memory compensatory strategies and distraction management techniques to maximize pt's functional independence in the home environment.  Pt's and pt's wife's questions were answered to their satisfaction at this time.  Will continue to review and reinforce recommendations due to concerns about wife's ability to adequately care for pt due to her own medical issues.  Pt was left in bed with call bell within reach, wife  at bedside.         Function:  Eating Eating                 Cognition Comprehension Comprehension assist level: Follows basic conversation/direction with extra time/assistive device  Expression   Expression assist level: Expresses basic 90% of the time/requires cueing < 10% of the time.  Social Interaction Social Interaction assist level: Interacts appropriately 90% of the time - Needs monitoring or encouragement for participation or interaction.  Problem Solving Problem solving assist level: Solves basic 50 - 74% of the time/requires cueing 25 - 49% of the time  Memory Memory assist level: Recognizes or recalls 25 - 49% of the time/requires cueing 50 - 75% of the time    Pain Pain Assessment Pain Assessment: No/denies pain  Therapy/Group: Individual Therapy  Jakita Dutkiewicz, Melanee SpryNicole L 12/10/2015, 4:08 PM

## 2015-12-10 NOTE — Progress Notes (Signed)
Physical Therapy Discharge Summary  Patient Details  Name: David Duke MRN: [id number] Date of Birth: 1935-03-05  Today's Date: 12/10/2015 PT Individual Time: 3810-1751 and 900-958 PT Individual Time Calculation (min): 30 min and 58 minutes   Patient has met 8 of 8 long term goals due to improved activity tolerance, improved balance, increased strength and improved awareness.  Patient to discharge at an ambulatory level Supervision.   Patient's care partner is independent to provide the necessary cognitive assistance at discharge.  Reasons goals not met: Pt met 8 out of 8 LTGs.  Recommendation:  Patient will benefit from ongoing skilled PT services in home health setting to continue to advance safe functional mobility, address ongoing impairments in reduced safety awareness with all functional mobility, dynamic standing balance, and minimize fall risk. Pt continues to be limited by decreased cognition and reduced safety awareness; Pt would benefit from continued education on this.  Equipment: RW  Reasons for discharge: treatment goals met  Patient/family agrees with progress made and goals achieved: Yes  Skilled PT treatment: Treatment 1: Pt received in w/c with wife Gabriel Cirri present, denying c/o pain & agreeable to PT. Wife present throughout entire treatment session for family training/education. Manual testing completed, please see below for details. Pt ambulated from [employer] (~300 ft) with RW & supervision; PT instructed pt & wife on pt's need to ambulate within base of walker & to look up at environment instead of at floor. Pt able to complete car transfer with supervision with wife managing catheter. Pt demonstrated bed mobility tasks in apartment bed with supervision due to wife managing catheter & pt requiring cuing for safety with bag. Pt able to negotiate 4 steps with B rails & supervision, and 4 steps  x 2 consecutive trials with supervision A with R ascending rail. Wife  able to manage catheter and instruct pt on turning to help prevent pt from getting catheter tubing tangled. Pt able to negotiate over uneven surface 6 ft x 2 with RW & supervision. PT educated pt & wife on floor transfer & situations when they would need to contact EMS; pt able to later verbalize 2/4 situations in which he would need to call EMS after a fall. Pt completed floor transfer with supervision A by pushing up on couch.  Pt able to retrieve objects from lower cabinets & refrigerator with 1 UE support on RW, supervision & no loss of balance. PT educated pt & wife on baskets/bags that can be attached to front of RW to allow him to carry items from one room to another, as it is not safe to attempt to hold objects & RW at same time. Pt then returned to room & left sitting on EOB with wife present & all needs within reach. Pt & wife voiced comfort & understanding over education during session.  Treatment 2: Pt received in bed, wife present, & agreeable to PT. Pt transferred OOB & wife able to manage catheter without cuing. Pt required assistance to don shoes, as he had difficulty not putting foot on top of tongue of shoe. Pt ambulated room>gym with supervision & wife able to provide verbal cuing not to let go of RW during ambulation. Pt negotiated 12 stairs (6 inches + 3 inches) with B railings & supervision A. Pt demonstrated good safety awareness as she was able to manage catheter & stand below pt at all times. Pt able to retreive object from floor with mod I. Discussed with pt & wife need for  pt not to go downstairs, not do any cooking, and not to climb step ladders or average ladders. Discussed what to do in emergency situations as pt was concerned with this. Pt ambulated back to room & CSW educated pt & wife on d/c plans. At end of session pt was left with all needs within reach.   PT Discharge Precautions/Restrictions Restrictions Weight Bearing Restrictions: No Pain Pain Assessment Pain  Assessment: 0-10 Pain Score: 8  Pain Location:  (sinus pain) Pain Intervention(s): RN made aware Vision/Perception    Wears glasses Cognition Safety/Judgment: Impaired Sensation Sensation Light Touch: Appears Intact (BLE) Proprioception: Appears Intact (BLE)  Mobility Bed Mobility Bed Mobility: Rolling Right;Rolling Left;Supine to Sit;Sit to Supine (pt requires supervision for bed mobility for assistance with managing catheter bag) Rolling Right: 5: Supervision Rolling Left: 5: Supervision Supine to Sit: 5: Supervision Sit to Supine: 5: Supervision Transfers Transfers: Yes Sit to Stand: 5: Supervision (verbal cuing for hand placement) Stand to Sit: 5: Supervision (verbal cuing for hand placement) Locomotion  Ambulation Ambulation: Yes Ambulation/Gait Assistance: 5: Supervision Ambulation Distance (Feet): 300 Feet Assistive device: Rolling walker Ambulation/Gait Assistance Details: Verbal cues for precautions/safety Stairs / Additional Locomotion Stairs: Yes Stairs Assistance: 5: Supervision (assistance with managing catheter bag) Stair Management Technique: Two rails Number of Stairs: 12 Height of Stairs:  (6 inches + 3 inches) Wheelchair Mobility Wheelchair Mobility: No  Extremity Assessment      RLE Assessment RLE Assessment: Within Functional Limits (ankle dorsiflexion = 4-/5, hip flexion & knee flexion/extension = 4+/5) LLE Assessment LLE Assessment: Within Functional Limits (ankle dorsiflexion = 4-/5, hip flexion & knee flexion/extension = 4+/5)   See Function Navigator for Current Functional Status.  Waunita Schooner 12/10/2015, 5:37 PM

## 2015-12-11 MED ORDER — FLUTICASONE PROPIONATE 50 MCG/ACT NA SUSP: 1.0000 | Freq: Every day | NASAL | Status: AC

## 2015-12-11 MED ORDER — TOPIRAMATE 50 MG PO TABS: 50.0000 mg | ORAL_TABLET | Freq: Two times a day (BID) | ORAL | Status: DC

## 2015-12-11 MED ORDER — SENNA 8.6 MG PO TABS: 2.0000 | ORAL_TABLET | Freq: Every day | ORAL | Status: DC

## 2015-12-11 MED ORDER — TRAMADOL-ACETAMINOPHEN 37.5-325 MG PO TABS: 1.0000 | ORAL_TABLET | Freq: Three times a day (TID) | ORAL | Status: DC | PRN

## 2015-12-11 MED ORDER — SACCHAROMYCES BOULARDII 250 MG PO CAPS: 250.0000 mg | ORAL_CAPSULE | Freq: Two times a day (BID) | ORAL | Status: DC

## 2015-12-11 MED ORDER — DIAZEPAM 5 MG PO TABS: 2.5000 mg | ORAL_TABLET | Freq: Three times a day (TID) | ORAL | Status: DC | PRN

## 2015-12-11 MED ORDER — ATORVASTATIN CALCIUM 20 MG PO TABS: 20.0000 mg | ORAL_TABLET | Freq: Every day | ORAL | Status: DC

## 2015-12-11 MED ORDER — FAMOTIDINE 20 MG PO TABS: 20.0000 mg | ORAL_TABLET | Freq: Every day | ORAL | Status: DC
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
## 2015-12-11 NOTE — Progress Notes (Signed)
Speech Language Pathology Discharge Summary  Patient Details  Name: David Duke MRN: [id number] Date of Birth: 1935/08/20   Patient has met 5 of 5 long term goals.  Patient to discharge at overall Min;Mod level.  Reasons goals not met: n/a   Clinical Impression/Discharge Summary:  Pt made inconsistent gains while inpatient and is discharging having met 5 out of 5 long term goals.  Pt requires min-mod assist verbal cues for basic cognitive tasks due to decreased intellectual/emergent awareness of deficits, decreased sustained attention to tasks, decreased recall of new information, and decreased functional problem solving.  Pt is discharging home with recommendations for 24/7 supervision from family as well as assistance for medication and financial management and ST follow up at next level of care.  Pt's wife aware of recommendations but would benefit from ongoing education at next level of care due to the extent of pt's deficits.    Care Partner:  Caregiver Able to Provide Assistance: Yes  Type of Caregiver Assistance: Physical;Cognitive  Recommendation:  24 hour supervision/assistance;Home Health SLP;Outpatient SLP  Rationale for SLP Follow Up: Reduce caregiver burden;Maximize cognitive function and independence   Equipment: none recommended by SLP    Reasons for discharge: Discharged from hospital   Patient/Family Agrees with Progress Made and Goals Achieved: Yes     Caily Rakers, Selinda Orion 12/11/2015, 3:20 PM

## 2015-12-11 NOTE — Discharge Summary (Signed)
Physician Discharge Summary  Patient ID: David Duke MRN: [id number] DOB/AGE: [birth year] 81 y.o.  Admit date: 11/28/2015 Discharge date: 12/11/2015  Discharge Diagnoses:  Principal Problem:   ICH (intracerebral hemorrhage) (HCC) Active Problems:   Gait disturbance, post-stroke   Ataxia, late effect of cerebrovascular disease   BPH (benign prostatic hyperplasia)   Urinary retention   Adjustment disorder with mixed anxiety and depressed mood   Chronic obstructive pulmonary disease (HCC)   Cephalalgia   AKI (acute kidney injury) (HCC)   Acute frontal sinusitis   Chronic tension-type headache, not intractable   Discharged Condition: Stable   Significant Diagnostic Studies:  Dg Chest 2 View  11/28/2015  CLINICAL DATA:  Chest tightness.  COPD. EXAM: CHEST  2 VIEW COMPARISON:  11/25/2015. FINDINGS: AP semi-erect chest demonstrates normal heart size with clear lung fields. No bony abnormality. Similar appearance to priors. IMPRESSION: No edema or consolidation. Electronically Signed   By: Elsie Stain M.D.   On: 11/28/2015 16:43     Labs:  Basic Metabolic Panel:  Recent Labs Lab 12/05/15 0447 12/07/15 0520 12/10/15 0555  NA 139 138 140  K 3.6 3.7 3.7  CL 106 105 107  CO2 GLUCOSE 93 87 100*  BUN CREATININE 1.52* 1.66* 1.61*  CALCIUM 8.9 8.9 9.2    CBC:  Recent Labs Lab 12/05/15 0447 12/07/15 0520 12/10/15 0555  WBC 10.0 9.9 9.1  NEUTROABS 6.8 6.6 5.6  HGB 14.0 14.5 13.5  HCT 41.4 42.8 41.5  MCV 89.2 90.5 89.6  PLT 294 321 296    CBG: No results for input(s): GLUCAP in the last 168 hours.  Brief HPI:   David Duke is a 81 y.o. ambidextrous male with history of macular degeneration with blindness right eye, anxiety disorder, COPD/tobacco abuse. He was admitted from Dr. Ephriam Knuckles office with blurred vision left eye as well as confusion. Blood pressure 145/98. Cranial CT scan showed acute intraparenchymal hemorrhage within the left  lower parietal occipital lobe measuring 5.6 x 2.9 cm with surrounding edema causing slight mass effect without midline shift. Echocardiogram showed EF f 70% grade 1 diastolic dysfunction. EEG nonspecific finding indicating of mild diffuse cerebral dysfunction no seizure activity. Carotid Dopplers was negative for ICA stenosis. Patient did not receive TPA due to intracranial hemorrhage. Follow-up cranial CT scan 11/26/2015 showing expected evolutionary changes with some contraction of the hyperdense clot in the left posterior temporal occipital region. No change in mass effect.. Patient has been unable to tolerate attempts at MRI due to claustrophobia. Agitation has resolved but he continues to have bouts of confusion. Dr. Pearlean Brownie questioned CAA v/s hypertensive bleed. He has had issues with worsening of HA as well as urinary retention requiring foley. Had febrile episode with leucocytosis but no signs of infection on work up. Therapy ongoing and patient with significant cognitive deficits, balance deficits as well as generalize weakness affecting mobility. CIR was recommended for follow up therapy.    Hospital Course: KOBIE WHIDBY was admitted to rehab 11/28/2015 for inpatient therapies to consist of PT, ST and OT at least three hours five days a week. Past admission physiatrist, therapy team and rehab RN have worked together to provide customized collaborative inpatient rehab.  CXR was ordered due to complaints of cough and showed no evidence of infection.  Claritin and Flonase was added to help manage post nasal drip.   He continued to complain of acute on chronic headaches therefore Topamax was added and  titrated to 50 mg bid without side effects. He has failed two voiding attempts despite changing Flomax to Rapaflo and addition of bethanecol.  Urine cultures 3/22 and 3/28 were negative for infection.  He has been difficult to cath due to enlarged prostate therefore Foley was replaced and he has been  scheduled to follow up with urology after discharge.   He continued to have bouts of confusion but agitation has resolved.  Valium was reduced to 2 mg tid prn.  Blood pressures have been monitored on bid basis and have been controlled. Follow up labs show rise in creatinine to 1.66 and he has been encouraged to push po fluids. He is to have recheck of lytes in 1-2 weeks after discharge.  Follow up CBC shows stable white count and H/H has improved to 13.5/41.5. Blood sugars have been checked in am and are ranging form 80-110. He has been educated on monitoring carbs/sweets due to prediabetes. He currently requires supervision with cues for safety and will continue to receive follow up HHPT, HHST, HHOT and HHRN by [facility] after discharge.    Rehab course: During patient's stay in rehab weekly team conferences were held to monitor patient's progress, set goals and discuss barriers to discharge. At admission, patient required min assist with basic self care tasks and mobility. He had moderate cognitive deficits affecting performance during the day. Swallow evaluation without signs of dysphagia.  He has had improvement in activity tolerance, balance, postural control, as well as ability to compensate for deficits. He requires min to moderate assist to manage foley. He is able to complete ADL tasks with supervision. He requires supervision with mobility and is able to ambulate ~300; with RW and supervision for safety. He requires min to moderate verbal cues for basic cognitive tasks due to poor awareness of deficits, problems with recall with decreased attention to tasks and difficulty with functional problem solving. Family education was done with wife regarding need for assistance with medications as well as financial issues.     Disposition: Home  Diet:  Special Instructions: 1. Drink plenty of fluids. 2. Recheck BMET in 7-10 days.  3. Follow up with Urologist regarding Urinary retention     Medication List    TAKE these medications        albuterol 108 (90 Base) MCG/ACT inhaler  Commonly known as:  PROVENTIL HFA;VENTOLIN HFA  Inhale 2 puffs into the lungs every 6 (six) hours as needed for wheezing or shortness of breath.     atorvastatin 20 MG tablet  Commonly known as:  LIPITOR  Take 1 tablet (20 mg total) by mouth daily at 6 PM.     calcium-vitamin D 500-200 MG-UNIT tablet  Commonly known as:  OSCAL WITH D  Take 1 tablet by mouth daily with breakfast.     diazepam 5 MG tablet  Commonly known as:  VALIUM  Take 0.5 tablets (2.5 mg total) by mouth every 8 (eight) hours as needed for anxiety.     famotidine 20 MG tablet  Commonly known as:  PEPCID  Take 1 tablet (20 mg total) by mouth daily.     Fish Oil 1000 MG Caps  Take 1,000 mg by mouth daily.     fluticasone 50 MCG/ACT nasal spray  Commonly known as:  FLONASE  Place 1 spray into both nostrils daily.     promethazine-codeine 6.25-10 MG/5ML syrup  Commonly known as:  PHENERGAN with CODEINE  Take 5 mLs by mouth every 6 (six)  hours as needed for cough.     saccharomyces boulardii 250 MG capsule  Commonly known as:  FLORASTOR  Take 1 capsule (250 mg total) by mouth 2 (two) times daily.     senna 8.6 MG Tabs tablet  Commonly known as:  SENOKOT  Take 2 tablets (17.2 mg total) by mouth at bedtime.     sertraline 100 MG tablet  Commonly known as:  ZOLOFT  Take 100 mg by mouth daily.     silodosin 8 MG Caps capsule  Commonly known as:  RAPAFLO  Take 8 mg by mouth daily with breakfast.     topiramate 50 MG tablet  Commonly known as:  TOPAMAX  Take 1 tablet (50 mg total) by mouth 2 (two) times daily.     traMADol-acetaminophen 37.5-325 MG tablet--Rx # 30 pills   Commonly known as:  ULTRACET  Take 1 tablet by mouth every 8 (eight) hours as needed (heaches.).       Follow-up Information    Follow up with Jaidan Stachnik Karis JubaAnil Tykeisha Peer, MD.   Specialty:  Physical Medicine and Rehabilitation   Why:  office will  call you with follow up appointment   Contact information:   [address] [name] [address] [phone]       Follow up with Delia HeadySETHI,PRAMOD, MD. Call today.   Specialties:  Neurology, Radiology   Why:  for follow up appointment   Contact information:   [address] [name] [address] [phone]       Follow up with Jearld Leschwight M Williams, MD On 12/19/2015.   Specialty:  Family Medicine   Why:  APPT @ 6:00 pm   Contact information:   [address] [name] [address] [phone]       Follow up with Di KindleSTONEKING,BRADLEY, MD On 12/12/2015.   Specialty:  Urology   Why:  Be there at 9:45 am for follow up appointment   Contact information:   [address] [name] [address] [phone]       Follow up with Edmon CrapeANKIN,GARY A, MD. Call today.   Specialty:  Ophthalmology   Why:  for follow up appointment   Contact information:   [address] [name] [address] [phone]       Signed: Jacquelynn CreeLove, Pamela S 12/11/2015, 10:25 AM

## 2015-12-11 NOTE — Progress Notes (Signed)
[place] PHYSICAL MEDICINE & REHABILITATION     PROGRESS NOTE  Subjective/Complaints:  Patient seen sitting up in bed this morning. He states the people here are "100% great". He states he slept okay and is ready to go home. He has questions about why this happened to him and if this lower happen again.    ROS: Denies CP, SOB, N/V/D  Objective: Vital Signs: Blood pressure 124/75, pulse 71, temperature 97.8 F (36.6 C), temperature source Oral, resp. rate 18, height 5\' 8"  (1.727 m), weight 60 kg (132 lb 4.4 oz), SpO2 96 %. No results found.  Recent Labs  12/10/15 0555  WBC 9.1  HGB 13.5  HCT 41.5  PLT 296    Recent Labs  12/10/15 0555  NA 140  K 3.7  CL 107  GLUCOSE 100*  BUN 16  CREATININE 1.61*  CALCIUM 9.2   CBG (last 3)  No results for input(s): GLUCAP in the last 72 hours.  Wt Readings from Last 3 Encounters:  12/11/15 60 kg (132 lb 4.4 oz)  11/22/15 64.1 kg (141 lb 5 oz)  03/31/15 67.586 kg (149 lb)    Physical Exam:  BP 124/75 mmHg  Pulse 71  Temp(Src) 97.8 F (36.6 C) (Oral)  Resp 18  Ht 5\' 8"  (1.727 m)  Wt 60 kg (132 lb 4.4 oz)  BMI 20.12 kg/m2  SpO2 96% Constitutional: He appears well-developed and well-nourished. NAD.  HENT: Normocephalic and atraumatic.  Eyes: Conjunctivae and EOM are normal.  Cardiovascular: Normal rate and regular rhythm.No murmur heard. Respiratory: Effort normal. No respiratory distress.  GI: Soft. Bowel sounds are normal. He exhibits no distension. There is no tenderness.  Musculoskeletal: He exhibits no edema or tenderness.  Neurological: He is alert.  Has poor safety awareness.  Mild ataxia with BUE.  A&Ox2 Motor: B/l UE: 5/5 proximal to distal B/l LE 5/5 proximal to distal  Skin: Skin is warm and dry. No rash noted. No erythema.  Psychiatric: His speech is tangential. Thought process is slowed. Cognition and memory are impaired.     Assessment/Plan: 1. 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 Physiatrist is providing close team supervision and 24 hour management of active medical problems listed below. Physiatrist and rehab team continue to assess barriers to discharge/monitor patient progress toward functional and medical goals.  Function:  Bathing Bathing position Bathing activity did not occur:  (he only bathed arm pits, back, & face) Position: Shower  Bathing parts Body parts bathed by patient: Right arm, Left arm, Chest, Abdomen, Front perineal area, Buttocks, Right upper leg, Left upper leg, Right lower leg, Left lower leg Body parts bathed by helper: Back  Bathing assist Assist Level: Supervision or verbal cues   Set up :  (To manage UD)  Upper Body Dressing/Undressing Upper body dressing   What is the patient wearing?: Pull over shirt/dress     Pull over shirt/dress - Perfomed by patient: Thread/unthread right sleeve, Thread/unthread left sleeve, Put head through opening, Pull shirt over trunk   Button up shirt - Perfomed by patient: Thread/unthread right sleeve, Thread/unthread left sleeve, Pull shirt around back Button up shirt - Perfomed by helper: Button/unbutton shirt    Upper body assist Assist Level: Set up, Supervision or verbal cues   Set up : To obtain clothing/put away  Lower Body Dressing/Undressing Lower body dressing Lower body dressing/undressing activity did not occur: Refused What is the  patient wearing?: Underwear, Pants, Non-skid slipper socks Underwear - Performed by patient: Thread/unthread left underwear leg, Pull underwear up/down Underwear - Performed by helper: Thread/unthread right underwear leg Pants- Performed by patient: Thread/unthread left pants leg, Pull pants up/down, Fasten/unfasten pants Pants- Performed by helper: Thread/unthread right pants leg Non-skid slipper socks- Performed by patient: Don/doff right sock, Don/doff left sock                  TED Hose - Performed by helper: Don/doff right TED hose, Don/doff left TED hose  Lower body assist Assist for lower body dressing: More than reasonable time, Set up, Supervision or verbal cues      Toileting Toileting Toileting activity did not occur: N/A Toileting steps completed by patient: Adjust clothing prior to toileting, Performs perineal hygiene, Adjust clothing after toileting Toileting steps completed by helper: Adjust clothing prior to toileting Toileting Assistive Devices: Grab bar or rail  Toileting assist Assist level: More than reasonable time   Transfers Chair/bed transfer   Chair/bed transfer method: Ambulatory Chair/bed transfer assist level: Supervision or verbal cues Chair/bed transfer assistive device: Patent attorney     Max distance: 300 ft Assist level: Supervision or verbal cues   Wheelchair Wheelchair activity did not occur: N/A Type: Manual Max wheelchair distance: 233ft Assist Level: Touching or steadying assistance (Pt > 75%)  Cognition Comprehension Comprehension assist level: Follows basic conversation/direction with extra time/assistive device  Expression Expression assist level: Expresses basic 90% of the time/requires cueing < 10% of the time.  Social Interaction Social Interaction assist level: Interacts appropriately 90% of the time - Needs monitoring or encouragement for participation or interaction.  Problem Solving Problem solving assist level: Solves basic 50 - 74% of the time/requires cueing 25 - 49% of the time  Memory Memory assist level: Recognizes or recalls 25 - 49% of the time/requires cueing 50 - 75% of the time    Medical Problem List and Plan: 1. Balance deficits and poor cognition secondary to left ICH.  DC today  Will see patient for transitional care management in 1-2 weeks  2. DVT Prophylaxis/Anticoagulation: Mechanical: Sequential compression devices, below knee Bilateral lower  extremities 3. Pain Management: Tylenol prn for pain 4. Mood: LCSW to follow for evaluation and support.  5. Neuropsych: This patient is not fully capable of making decisions on his own behalf.  Appreciate neuropsych input  Will consider Nudexa 6. Skin/Wound Care: routine pressure relief measures. 7. Fluids/Electrolytes/Nutrition: Monitor I/O.  8. H/o BPH/Urinary retention:    UA with rare bacteria on 3/23, urine culture with insignificant growth   Changed flomax to rapaflo adjusted for kidney function.   Bethanechol 10 added on 3/27, increased to 25 on 3/28  Urine culture With no growth on 3/28  Will DC patient with Foley, due to significantly enlarged prostate and difficulty with I/O cath, and have patient follow-up with his urologist 9. Depression/PTSD/anxiety: On Zoloft daily with valium prn.  10. COPD: Uses albuterol daily due to "tightness' Also has issues with congestion.  Chest x-ray reviewed from 3/22, relatively unremarkable  Changed schedule nebs to when necessary on 3/23.  11. Headaches: Improved  Fioricet changed to Topamax 25 mg on 3/23, increased to 50 on 3/27.   Added Claritin and Flonase to help with nasal/sinus congestion.  12. Confusion:    Continue to reorient and adjust sleep-wake cycle.   Valium changed when necessary and dose decreased  DCed Claritin 13. Prediabetes: Periodically monitor 14. AKI: continue to  monitor. Avoid nephrotoxic meds.  Creatinine 1.61 on 4/3  Encourage oral fluids 15: Hypokalemia: Resolved  3.7 on 4/3  Will continue to monitor  16. Sinusitis  Completed Augmentin 3/24-3/31  LOS (Days) 13 A FACE TO FACE EVALUATION WAS PERFORMED  Ankit Karis Juba 12/11/2015 9:00 AM

## 2015-12-11 NOTE — Discharge Instructions (Signed)
Inpatient Rehab Discharge Instructions  David Duke Discharge date and time:  12/11/15  Activities/Precautions/ Functional Status: Activity: no lifting, driving, or strenuous exercise till cleared by MD.  Diet: cardiac diet and diabetic diet Wound Care: none needed   Functional status:  ___ No restrictions     ___ Walk up steps independently _X__ 24/7 supervision/assistance   ___ Walk up steps with assistance ___ Intermittent supervision/assistance  ___ Bathe/dress independently ___ Walk with walker     _X__ Bathe/dress with assistance ___ Walk Independently    ___ Shower independently ___ Walk with assistance    ___ Shower with assistance _X__ No alcohol     ___ Return to work/school ________  Special Instructions: 1. No Driving 2. Needs assistance to manage medications and finances.    COMMUNITY REFERRALS UPON DISCHARGE:    Home Health:   PT, OT, SP, RN  Agency:[facility] [phone]   Date of last service:12/11/2015  Medical Equipment/Items Ordered:  Agency/Supplier:[facility]   [phone] Other:PRIVATE DUTY AGENCIES TO ASSIST WIFE WITH HUSBAND'S CARE  GENERAL COMMUNITY RESOURCES FOR PATIENT/FAMILY: Support Groups:[facility] SUPPORT GROUP SECOND Thursday @ 3:00-4:00 PM ON THE REHAB UNIT QUESTIONS CONTACT KATIE [phone]   STROKE/TIA DISCHARGE INSTRUCTIONS SMOKING Cigarette smoking nearly doubles your risk of having a stroke & is the single most alterable risk factor  If you smoke or have smoked in the last 12 months, you are advised to quit smoking for your health.  Most of the excess cardiovascular risk related to smoking disappears within a year of stopping.  Ask you doctor about anti-smoking medications  [facility] Quit Line: 1-800-QUIT NOW  Free Smoking Cessation Classes [phone]  CHOLESTEROL Know your levels; limit fat & cholesterol in your diet  Lipid Panel     Component Value Date/Time   CHOL 213* 11/22/2015 1839   TRIG 188*  11/22/2015 1839   HDL 53 11/22/2015 1839   CHOLHDL 4.0 11/22/2015 1839   VLDL 38 11/22/2015 1839   LDLCALC 122* 11/22/2015 1839      Many patients benefit from treatment even if their cholesterol is at goal.  Goal: Total Cholesterol (CHOL) less than 160  Goal:  Triglycerides (TRIG) less than 150  Goal:  HDL greater than 40  Goal:  LDL (LDLCALC) less than 100   BLOOD PRESSURE American Stroke Association blood pressure target is less that 120/80 mm/Hg  Your discharge blood pressure is:  BP: 124/75 mmHg  Monitor your blood pressure  Limit your salt and alcohol intake  Many individuals will require more than one medication for high blood pressure  DIABETES (A1c is a blood sugar average for last 3 months) Goal HGBA1c is under 7% (HBGA1c is blood sugar average for last 3 months)  Diabetes: Pre-diabetic    Lab Results  Component Value Date   HGBA1C 5.9* 11/23/2015     Your HGBA1c can be lowered with medications, healthy diet, and exercise.  Check your blood sugar as directed by your physician  Call your physician if you experience unexplained or low blood sugars.  PHYSICAL ACTIVITY/REHABILITATION Goal is 30 minutes at least 4 days per week  Activity: No driving, Therapies: See above Return to work: N/A  Activity decreases your risk of heart attack and stroke and makes your heart stronger.  It helps control your weight and blood pressure; helps you relax and can improve your mood.  Participate in a regular exercise program.  Talk with your doctor about the best form of exercise for you (dancing,  walking, swimming, cycling).  DIET/WEIGHT Goal is to maintain a healthy weight  Your discharge diet is: Diet heart healthy/carb modified Room service appropriate?: Yes; Fluid consistency:: Thin  liquids Your height is:  Height: 5\' 8"  (172.7 cm) Your current weight is: Weight: 60 kg (132 lb 4.4 oz) Your Body Mass Index (BMI) is:  BMI (Calculated): 21.5  Following the type of diet  specifically designed for you will help prevent another stroke.  You are at  goal weight.  Your goal Body Mass Index (BMI) is 19-24.  Healthy food habits can help reduce 3 risk factors for stroke:  High cholesterol, hypertension, and excess weight.  RESOURCES Stroke/Support Group:  Call [phone]   STROKE EDUCATION PROVIDED/REVIEWED AND GIVEN TO PATIENT Stroke warning signs and symptoms How to activate emergency medical system (call 911). Medications prescribed at discharge. Need for follow-up after discharge. Personal risk factors for stroke. Pneumonia vaccine given:  Flu vaccine given:  My questions have been answered, the writing is legible, and I understand these instructions.  I will adhere to these goals & educational materials that have been provided to me after my discharge from the hospital.      My questions have been answered and I understand these instructions. I will adhere to these goals and the provided educational materials after my discharge from the hospital.  Patient/Caregiver Signature _______________________________ Date __________  Clinician Signature _______________________________________ Date __________  Please bring this form and your medication list with you to all your follow-up doctor's appointments.

## 2015-12-11 NOTE — Progress Notes (Signed)
Occupational Therapy Discharge Summary  Patient Details  Name: David Duke MRN: [id number] Date of Birth: [birth year]  Patient has met 9 of 12 long term goals due to improved activity tolerance, ability to compensate for deficits and improved attention.  Patient to discharge at overall Supervision level, min assist to manage urinary device.  Patient's care partner is independent to provide the necessary cognitive and physical assistance at discharge.  Complex interpersonal relationship between patient and spouse was unclear at time of discharge as spouse was both tearful and inhibited in providing directions and control of patient's impulsivity and agitation/frustration with level of care required.   Reasons goals not met: Patient requires minimal physical assistance to manage and maintain catheter bag during performance of BADL which was maintained throughout admission d/t lack of success with planned independence at bladder management.    Recommendation:  Patient will benefit from ongoing skilled OT services in home health setting to continue to advance functional skills in the area of Reduce care partner burden.   Recommend comprehensive visual assessment follow-up through appropriate provider (opthomologist & optometrist).  Equipment: No equipment provided  Reasons for discharge: discharge from hospital  Patient/family agrees with progress made and goals achieved: Yes  OT Discharge Precautions/Restrictions  Precautions Precautions: Fall Restrictions Weight Bearing Restrictions: No Pain Pain Assessment Pain Assessment: No/denies pain ADL ADL ADL Comments: see Functional Assessment Tool Vision/Perception  Vision- History Baseline Vision/History: Wears glasses Wears Glasses: At all times Patient Visual Report: Central vision impairment;Peripheral vision impairment Perception Comments: WFL for BADL. limited by chronic visual impairment  Cognition Overall Cognitive Status:  Impaired/Different from baseline Arousal/Alertness: Awake/alert Orientation Level: Oriented to person;Oriented to situation;Disoriented to place;Disoriented to time Attention: Sustained Focused Attention: Appears intact Sustained Attention: Impaired Sustained Attention Impairment: Verbal complex;Functional basic Memory: Impaired Memory Impairment: Storage deficit;Retrieval deficit;Decreased recall of new information Awareness: Impaired Awareness Impairment: Intellectual impairment Problem Solving: Impaired Problem Solving Impairment: Functional basic;Verbal basic Sequencing: Appears intact Organizing: Impaired Organizing Impairment: Verbal complex;Functional complex Behaviors: Impulsive;Poor frustration tolerance Safety/Judgment: Impaired Comments: Requires close supervision and redirection during functional tasks d/t impaired awareness, denial of severity of deficits, visual and memory impairment. Sensation Sensation Light Touch: Appears Intact Stereognosis: Appears Intact Hot/Cold: Appears Intact Proprioception: Appears Intact Coordination Gross Motor Movements are Fluid and Coordinated: Yes Fine Motor Movements are Fluid and Coordinated: Yes Motor  Motor Motor: Within Functional Limits Mobility  Bed Mobility Bed Mobility: Rolling Right;Rolling Left;Supine to Sit;Sit to Supine Rolling Right: 6: Modified independent (Device/Increase time) Supine to Sit: 6: Modified independent (Device/Increase time) Sit to Supine: 6: Modified independent (Device/Increase time) Transfers Transfers: Sit to Stand;Stand to Sit Sit to Stand: 5: Supervision Sit to Stand Details: Verbal cues for precautions/safety Stand to Sit: 5: Supervision Stand to Sit Details (indicate cue type and reason): Verbal cues for precautions/safety  Trunk/Postural Assessment  Cervical Assessment Cervical Assessment: Within Functional Limits Thoracic Assessment Thoracic Assessment: Within Functional  Limits Lumbar Assessment Lumbar Assessment: Within Functional Limits Postural Control Postural Control: Within Functional Limits  Extremity/Trunk Assessment RUE Assessment RUE Assessment: Within Functional Limits LUE Assessment LUE Assessment: Within Functional Limits   See Function Navigator for Current Functional Status.  [facility] 12/12/2015, 9:32 AM

## 2015-12-11 NOTE — Progress Notes (Signed)
Social Work  Discharge Note  The overall goal for the admission was met for:   Discharge location: Yes-HOME WITH WIFE WHO CAN PROVIDE 24 HR SUPERVISION   Length of Stay: Yes-13 DAYS  Discharge activity level: Yes-SUPERVISION LEVEL WITH CUES  Home/community participation: Yes  Services provided included: MD, RD, PT, OT, SLP, RN, CM, TR, Pharmacy, Neuropsych and SW  Financial Services: Private Insurance: [facility]  Follow-up services arranged: Home Health: [facility]-PT,OT,SP,RN, DME: [facility] and Patient/Family has no preference for HH/DME agencies  Comments (or additional information):WIFE Porter. BOTH ARE COMFORTABLE WITH HIS CARE AND DISCHARGE TODAY. WIFE FEELS IT WILL TAKE TIME TO MAKE A ROUTINE AND CHANGE THE WAY THEY MANAGE AT HOME, DUE TO PT'S STROKE.   Patient/Family verbalized understanding of follow-up arrangements: Yes  Individual responsible for coordination of the follow-up plan: [name]-WIFE  Confirmed correct DME delivered: Elease Hashimoto 12/11/2015    Elease Hashimoto

## 2015-12-13 ENCOUNTER — Telehealth: Payer: Self-pay

## 2015-12-13 NOTE — Telephone Encounter (Signed)
1. Are you/is patient experiencing any problems since coming home? Are there any questions regarding any aspect of care? Problems urinating, found a blood clot in urethra at [facility] on 12/12/15. No questions regarding care. 2. Are there any questions regarding medications administration/dosing? Are meds being taken as prescribed? Patient should review meds with caller to confirm. Meds reviewed.  3. Have there been any falls? No 4. Has Home Health been to the house and/or have they contacted you? If not, have you tried to contact them? Can we help you contact them? HH PT and RN at 10:00 this morning. ST scheduled for tomorrow.  5. Are bowels and bladder emptying properly? Are there any unexpected incontinence issues? If applicable, is patient following bowel/bladder programs? Catheter was  taken out at urologist. Afterwards, he had problems urinating. A blood clot in his urethra was found. 6. Any fevers, problems with breathing, unexpected pain? No  7. Are there any skin problems or new areas of breakdown? No 8. Has the patient/family member arranged specialty MD follow up (ie cardiology/neurology/renal/surgical/etc)?  Can we help arrange? Follow up's have been made.   9. Does the patient need any other services or support that we can help arrange? No 10. Are caregivers following through as expected in assisting the patient? Yes 11. Has the patient quit smoking, drinking alcohol, or using drugs as recommended? Has smoked 2 cigarettes since being discharged. No alcohol or drugs. Reinforced the importance of quitting tobacco use.   Pt is aware of appt at 12/20/15 @ 10:00am. New pt packet will be sent.

## 2015-12-20 ENCOUNTER — Encounter: Payer: Medicare Other | Admitting: Physical Medicine & Rehabilitation

## 2015-12-24 ENCOUNTER — Emergency Department (HOSPITAL_BASED_OUTPATIENT_CLINIC_OR_DEPARTMENT_OTHER)
Admission: EM | Admit: 2015-12-24 | Discharge: 2015-12-24 | Disposition: A | Discharge status: Home / Self Care | Payer: Medicare Other | Attending: Emergency Medicine | Admitting: Emergency Medicine

## 2015-12-24 ENCOUNTER — Telehealth: Payer: Self-pay | Admitting: *Deleted

## 2015-12-24 ENCOUNTER — Encounter (HOSPITAL_BASED_OUTPATIENT_CLINIC_OR_DEPARTMENT_OTHER): Payer: Self-pay

## 2015-12-24 DIAGNOSIS — F17210 Nicotine dependence, cigarettes, uncomplicated: Secondary | ICD-10-CM | POA: Diagnosis not present

## 2015-12-24 DIAGNOSIS — R4701 Aphasia: Secondary | ICD-10-CM

## 2015-12-24 DIAGNOSIS — Z79899 Other long term (current) drug therapy: Secondary | ICD-10-CM | POA: Diagnosis not present

## 2015-12-24 DIAGNOSIS — N289 Disorder of kidney and ureter, unspecified: Secondary | ICD-10-CM | POA: Diagnosis not present

## 2015-12-24 DIAGNOSIS — F419 Anxiety disorder, unspecified: Secondary | ICD-10-CM | POA: Diagnosis not present

## 2015-12-24 DIAGNOSIS — Z7951 Long term (current) use of inhaled steroids: Secondary | ICD-10-CM | POA: Diagnosis not present

## 2015-12-24 DIAGNOSIS — H5441 Blindness, right eye, normal vision left eye: Secondary | ICD-10-CM | POA: Diagnosis not present

## 2015-12-24 DIAGNOSIS — F329 Major depressive disorder, single episode, unspecified: Secondary | ICD-10-CM | POA: Diagnosis not present

## 2015-12-24 DIAGNOSIS — E7800 Pure hypercholesterolemia, unspecified: Secondary | ICD-10-CM | POA: Insufficient documentation

## 2015-12-24 DIAGNOSIS — Y658 Other specified misadventures during surgical and medical care: Secondary | ICD-10-CM | POA: Diagnosis not present

## 2015-12-24 DIAGNOSIS — T83091A Other mechanical complication of indwelling urethral catheter, initial encounter: Secondary | ICD-10-CM | POA: Insufficient documentation

## 2015-12-24 DIAGNOSIS — T839XXA Unspecified complication of genitourinary prosthetic device, implant and graft, initial encounter: Secondary | ICD-10-CM

## 2015-12-24 DIAGNOSIS — I69993 Ataxia following unspecified cerebrovascular disease: Secondary | ICD-10-CM

## 2015-12-24 DIAGNOSIS — R269 Unspecified abnormalities of gait and mobility: Secondary | ICD-10-CM

## 2015-12-24 DIAGNOSIS — I612 Nontraumatic intracerebral hemorrhage in hemisphere, unspecified: Secondary | ICD-10-CM

## 2015-12-24 DIAGNOSIS — I69398 Other sequelae of cerebral infarction: Secondary | ICD-10-CM

## 2015-12-24 DIAGNOSIS — I69919 Unspecified symptoms and signs involving cognitive functions following unspecified cerebrovascular disease: Secondary | ICD-10-CM

## 2015-12-24 DIAGNOSIS — Z8673 Personal history of transient ischemic attack (TIA), and cerebral infarction without residual deficits: Secondary | ICD-10-CM | POA: Diagnosis not present

## 2015-12-24 DIAGNOSIS — R339 Retention of urine, unspecified: Secondary | ICD-10-CM | POA: Diagnosis present

## 2015-12-24 DIAGNOSIS — J449 Chronic obstructive pulmonary disease, unspecified: Secondary | ICD-10-CM | POA: Diagnosis not present

## 2015-12-24 HISTORY — DX: Cerebral infarction, unspecified: I63.9

## 2015-12-24 LAB — URINE MICROSCOPIC-ADD ON

## 2015-12-24 LAB — BASIC METABOLIC PANEL
ANION GAP: 6 (ref 5–15)
BUN: 15 mg/dL (ref 4–21)
BUN: 15 mg/dL (ref 6–20)
CHLORIDE: 109 mmol/L (ref 101–111)
CO2: 21 mmol/L — ABNORMAL LOW (ref 22–32)
CREATININE: 1.8 mg/dL — AB (ref 0.6–1.3)
Calcium: 8.7 mg/dL — ABNORMAL LOW (ref 8.9–10.3)
Creatinine, Ser: 1.78 mg/dL — ABNORMAL HIGH (ref 0.61–1.24)
GFR, EST AFRICAN AMERICAN: 40 mL/min — AB (ref >60)
GFR, EST NON AFRICAN AMERICAN: 35 mL/min — AB (ref >60)
GLUCOSE: 105 mg/dL
Glucose, Bld: 105 mg/dL — ABNORMAL HIGH (ref 65–99)
POTASSIUM: 3.9 mmol/L (ref 3.5–5.1)
SODIUM: 136 mmol/L — AB (ref 137–147)
SODIUM: 136 mmol/L (ref 135–145)

## 2015-12-24 LAB — URINALYSIS, ROUTINE W REFLEX MICROSCOPIC
GLUCOSE, UA: NEGATIVE mg/dL
KETONES UR: 15 mg/dL — AB
NITRITE: NEGATIVE
PH: 6.5 (ref 5.0–8.0)
PROTEIN: 100 mg/dL — AB
Specific Gravity, Urine: 1.027 (ref 1.005–1.030)

## 2015-12-24 NOTE — ED Provider Notes (Signed)
CSN: [id number]     Arrival date & time 12/24/15  1659 History  By signing my name below, I, Linus GalasMaharshi Patel, attest that this documentation has been prepared under the direction and in the presence of No att. providers found. Electronically Signed: Linus GalasMaharshi Patel, ED Scribe. 12/25/2015. 5:51 PM.   Chief Complaint  Patient presents with  . Urinary Retention   The history is provided by the patient and the spouse. No language interpreter was used.   HPI Comments: David Duke is a 81 y.o. male who presents to the Emergency Department with no pertinent PMHx complaining of urinary retention that began today. Pts wife reports the pt was seen by his Urologist today who "took out the pts catheter." During that time, the pt was reportedly able to urinate on his own. They retuned home but the pt noted a gradual decrease in urination. They visited the Urologist again and had a new catheter placed. When the pt came back home again, he soon began to have recurrence of lower abdominal pain and called EMS to be brought to the ED for further evaluation. Pt denies any fevers, chills, CP, SOB, N/V/D, dysuria, or any other symptoms at this time.    Past Medical History  Diagnosis Date  . High cholesterol   . Depression   . Anxiety   . Macular degeneration of both eyes   . Blind right eye   . COPD (chronic obstructive pulmonary disease) (HCC)   . Shortness of breath dyspnea   . Stroke [facility](HCC)    Past Surgical History  Procedure Laterality Date  . No past surgeries     Family History  Problem Relation Age of Onset  . Colon cancer Mother   . Cerebral aneurysm Father    Social History  Substance Use Topics  . Smoking status: Current Every Day Smoker -- 0.50 packs/day for 60 years    Types: Cigarettes  . Smokeless tobacco: None  . Alcohol Use: No    Review of Systems  Constitutional: Negative for fever and chills.  Respiratory: Negative for shortness of breath.   Cardiovascular: Negative for  chest pain.  Gastrointestinal: Negative for nausea, vomiting and diarrhea.  Genitourinary: Positive for decreased urine volume. Negative for dysuria.  All other systems reviewed and are negative.  Allergies  Review of patient's allergies indicates no known allergies.  Home Medications   Prior to Admission medications   Medication Sig Start Date End Date Taking? Authorizing Provider  albuterol (PROVENTIL HFA;VENTOLIN HFA) 108 (90 Base) MCG/ACT inhaler Inhale 2 puffs into the lungs every 6 (six) hours as needed for wheezing or shortness of breath.    Historical Provider, MD  atorvastatin (LIPITOR) 20 MG tablet Take 1 tablet (20 mg total) by mouth daily at 6 PM. 12/11/15   Evlyn KannerPamela S Love, PA-C  calcium-vitamin D (OSCAL WITH D) 500-200 MG-UNIT tablet Take 1 tablet by mouth daily with breakfast.    Historical Provider, MD  diazepam (VALIUM) 5 MG tablet Take 0.5 tablets (2.5 mg total) by mouth every 8 (eight) hours as needed for anxiety. 12/11/15   Jacquelynn CreePamela S Love, PA-C  famotidine (PEPCID) 20 MG tablet Take 1 tablet (20 mg total) by mouth daily. 12/11/15   Evlyn KannerPamela S Love, PA-C  fluticasone (FLONASE) 50 MCG/ACT nasal spray Place 1 spray into both nostrils daily. 12/11/15   Jacquelynn CreePamela S Love, PA-C  Omega-3 Fatty Acids (FISH OIL) 1000 MG CAPS Take 1,000 mg by mouth daily.    Historical Provider, MD  promethazine-codeine (PHENERGAN WITH CODEINE) 6.25-10 MG/5ML syrup Take 5 mLs by mouth every 6 (six) hours as needed for cough.    Historical Provider, MD  saccharomyces boulardii (FLORASTOR) 250 MG capsule Take 1 capsule (250 mg total) by mouth 2 (two) times daily. 12/11/15   Jacquelynn Cree, PA-C  senna (SENOKOT) 8.6 MG TABS tablet Take 2 tablets (17.2 mg total) by mouth at bedtime. 12/11/15   Jacquelynn Cree, PA-C  sertraline (ZOLOFT) 100 MG tablet Take 100 mg by mouth daily.    Historical Provider, MD  silodosin (RAPAFLO) 8 MG CAPS capsule Take 8 mg by mouth daily with breakfast.    Historical Provider, MD  topiramate  (TOPAMAX) 50 MG tablet Take 1 tablet (50 mg total) by mouth 2 (two) times daily. 12/11/15   Jacquelynn Cree, PA-C  traMADol-acetaminophen (ULTRACET) 37.5-325 MG tablet Take 1 tablet by mouth every 8 (eight) hours as needed (heaches.). 12/11/15   [name] Love, PA-C   BP 138/83 mmHg  Pulse 100  Temp(Src) 98.5 F (36.9 C) (Oral)  Resp 16  Ht  (1.727 m)  Wt 140 lb (63.504 kg)  BMI 21.29 kg/m2  SpO2 100%  Vitals reviewed Physical Exam  Physical Examination: General appearance - alert, well appearing, and in no distress Mental status - alert, oriented to person, place, and time Eyes - no conjunctival injection, no scleral icterus Mouth - mucous membranes moist, pharynx normal without lesions Chest - clear to auscultation, no wheezes, rales or rhonchi, symmetric air entry Heart - normal rate, regular rhythm, normal S1, S2, no murmurs, rubs, clicks or gallops Abdomen - soft, nontender, nondistended, no masses or organomegaly GU Male - foley cath in place draining clear yellow urine Neurological - alert, oriented, normal speech Extremities - peripheral pulses normal, no pedal edema, no clubbing or cyanosis Skin - normal coloration and turgor, no rashes  ED Course  Procedures   DIAGNOSTIC STUDIES: Oxygen Saturation is 100% on room air, normal by my interpretation.    COORDINATION OF CARE: 5:47 PM Will order urinalysis and urine culture. Discussed treatment plan with pt and wife at bedside and they agreed to plan.  Labs Review Labs Reviewed  URINALYSIS, ROUTINE W REFLEX MICROSCOPIC (NOT AT [facility]) - Abnormal; Notable for the following:    Color, Urine AMBER (*)    APPearance TURBID (*)    Hgb urine dipstick LARGE (*)    Bilirubin Urine SMALL (*)    Ketones, ur 15 (*)    Protein, ur 100 (*)    Leukocytes, UA SMALL (*)    All other components within normal limits  URINE MICROSCOPIC-ADD ON - Abnormal; Notable for the following:    Squamous Epithelial / LPF 0-5 (*)    Bacteria, UA MANY  (*)    Casts HYALINE CASTS (*)    Crystals URIC ACID CRYSTALS (*)    All other components within normal limits  BASIC METABOLIC PANEL - Abnormal; Notable for the following:    CO2 21 (*)    Glucose, Bld 105 (*)    Creatinine, Ser 1.78 (*)    Calcium 8.7 (*)    GFR calc non Af Amer 35 (*)    GFR calc Af Amer 40 (*)    All other components within normal limits  URINE CULTURE    Imaging Review No results found. I have personally reviewed and evaluated these images and lab results as part of my medical decision-making.   EKG Interpretation None  MDM   Final diagnoses:  Foley catheter problem, initial encounter [facility])  Renal insufficiency    Pt presenting with c/o concern for urinary retention and discomfort due to foley catheter.  Bladder scan shows 13cc in bladder and foley is draining urine well while in the ED.  Doubt disfunction of cathter.  Urine obtained and will be sent for culture.  Pt advised to f/u with urology.  No systemic symptoms of infection or other acute emergent problem at this time.  Discharged with strict return precautions.  Pt agreeable with plan.  I personally performed the services described in this documentation, which was scribed in my presence. The recorded information has been reviewed and is accurate.     Jerelyn Scott, MD 12/25/15 [phone]

## 2015-12-24 NOTE — Discharge Instructions (Signed)
Return to the ED with any concerns including decreased urine output, fever/chills, vomiting, decreased level of alertness/lethargy, or any other alarming symptoms

## 2015-12-24 NOTE — ED Notes (Signed)
Pt coming via ems from home for eval of clogged urinary catheter.

## 2015-12-24 NOTE — ED Notes (Signed)
Clothes and other belongings placed in pt belongings bag on bed.

## 2015-12-25 LAB — URINE CULTURE: Culture: NO GROWTH
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
## 2015-12-25 NOTE — Telephone Encounter (Signed)
[facility] for [facility] to continue visits through this week.  She asked that we schedule for PT ST outpt therapy beginning 12/31/15.  VO given.  Therapy scheduled

## 2015-12-26 DIAGNOSIS — I69211 Memory deficit following other nontraumatic intracranial hemorrhage: Secondary | ICD-10-CM | POA: Diagnosis not present

## 2015-12-26 DIAGNOSIS — R339 Retention of urine, unspecified: Secondary | ICD-10-CM | POA: Diagnosis not present

## 2015-12-26 DIAGNOSIS — H3530 Unspecified macular degeneration: Secondary | ICD-10-CM | POA: Diagnosis not present

## 2015-12-27 ENCOUNTER — Telehealth (HOSPITAL_COMMUNITY): Payer: Self-pay | Admitting: Licensed Clinical Social Worker

## 2015-12-27 ENCOUNTER — Encounter: Payer: Medicare Other | Admitting: Physical Medicine & Rehabilitation

## 2015-12-27 ENCOUNTER — Telehealth: Payer: Self-pay | Admitting: Neurology
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
## 2015-12-27 NOTE — Telephone Encounter (Signed)
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 Looks like he needs to be seen sooner than 2 month appointment. If I do not have any openings may be a new consult for new symptoms with any other provider

## 2015-12-27 NOTE — Telephone Encounter (Signed)
Pt's wife called said she has noticed he has tremors in the am and pm for about 1-1/2 week intermittently in hands and arms. She said he wakes up confused. He doesn't remember the last sentence she has said. He is hard of hearing but that never used to be an issue. Now he is asking her to repeat things 3 or 4 times. Sometimes he doesn't know what day it is or what time of day it is. He has good physical strength but cognitive is not good. Memory comes and goes. She said he has lost interest in personal hygiene, said it has been 7 days since he's had a shower. She said he will wash his face and hands. She said he has appt with urologist Dr Wynelle Linkttlin on 01/01/16.

## 2015-12-28 NOTE — Telephone Encounter (Signed)
My first week of May is booked and I have 3 weeks to cover in hospital in May. If the patient can be seen by other providers or Eber Jonesarolyn earlier, that will be great. Thanks.  Marvel PlanJindong Marks Scalera, MD PhD Stroke Neurology 12/28/2015 1:14 PM

## 2015-12-31 ENCOUNTER — Telehealth: Payer: Self-pay | Admitting: Neurology

## 2015-12-31 ENCOUNTER — Telehealth: Payer: Self-pay | Admitting: Physical Medicine & Rehabilitation
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
## 2015-12-31 NOTE — Telephone Encounter (Addendum)
If patients wife call back, Dr.Sethi had two openings for office follow up for new patient. Please schedule patient asap. i explain on vm for call back because the appts may be taken. LFt Vm for patient.
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
## 2015-12-31 NOTE — Telephone Encounter (Signed)
Patient's son Kaleen OdeaDavid Dendinger Jr has some questions about medications and would like for someone to call him back.  It looks like patient has 2 appointments, but both of them were canceled with Allena KatzPatel.
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
## 2016-01-01 ENCOUNTER — Telehealth: Payer: Self-pay | Admitting: Physical Medicine & Rehabilitation

## 2016-01-01 NOTE — Telephone Encounter (Signed)
Sue LushAndrea Nurse Manger with [facility] needs to get a verbal order for an Aide service for patient.  Patient is staying with son and they have requested help with patient.  Please call Sue Lushndrea at [phone].
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
## 2016-01-01 NOTE — Telephone Encounter (Signed)
Pt's son , [name] , called in and does not like [facility]. He would like to have a new referral put in for different in home care service. Son states pt does not need ST, he does require PT and OT. Son is also requesting a CNA to come in to help with bathing 2 or 3 days a week. The son says his speech is just fine but does need help with the others. He did not have good service with [facility] and when asked for a managers name they would not provide one for him. Son is requesting a call back to let him know status of referral.

## 2016-01-02 NOTE — Telephone Encounter (Signed)
Rn call patients son back about his fathers home health care. Rn explain per Kelly(PT) his father was seen 3 to 4 times by OT, PT, and a nurse. Pts son stated that his father needs assistance with grooming. His father has a cane, walker, but he may need a shower chair for safety. The speech therapist was at house when the [facility]. Rn stated [facility] will take care of any needs his father has. Chapman Mossavid Jr appreciate the phone call,and will be coming to the appt tomorrow with Dr.,Sethi.

## 2016-01-02 NOTE — Telephone Encounter (Signed)
[facility] [phone] request VO to continue speech therapy for cognition, 2 x/week for the next 2 weeks

## 2016-01-03 ENCOUNTER — Ambulatory Visit (INDEPENDENT_AMBULATORY_CARE_PROVIDER_SITE_OTHER): Payer: Medicare Other | Admitting: Neurology

## 2016-01-03 ENCOUNTER — Encounter: Payer: Self-pay | Admitting: Neurology

## 2016-01-03 ENCOUNTER — Telehealth: Payer: Self-pay | Admitting: Neurology

## 2016-01-03 VITALS — BP 104/66 | HR 95 | Ht 68.0 in | Wt 134.8 lb

## 2016-01-03 DIAGNOSIS — I611 Nontraumatic intracerebral hemorrhage in hemisphere, cortical: Secondary | ICD-10-CM | POA: Diagnosis not present

## 2016-01-03 NOTE — Telephone Encounter (Signed)
Son [name] called to advise nurse Katrina that [facility] called while he and Father were in our office to let them know, OT therapy was approved. Wanted to thank Katrina for all of her help today.

## 2016-01-03 NOTE — Telephone Encounter (Signed)
Unable to leave message on Revonda Standardllison vm for orders for speech therapy.Her vm is full. Will try to call later to give verbal order.
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
## 2016-01-03 NOTE — Telephone Encounter (Signed)
Gave verbal speech orders for patient [facility].

## 2016-01-03 NOTE — Telephone Encounter (Signed)
Okay thanks for the FYI 

## 2016-01-03 NOTE — Patient Instructions (Signed)
I had a long d/w patient about his recent hemorhhagic stroke, risk for recurrent stroke/TIAs, personally independently reviewed imaging studies and stroke evaluation results and answered questions.Continue  strict control of hypertension with blood pressure goal below 130/90, diabetes with hemoglobin A1c goal below 6.5% and lipids with LDL cholesterol goal below 70 mg/dL. I also advised the patient to eat a healthy diet with plenty of whole grains, cereals, fruits and vegetables, exercise regularly and maintain ideal body weight. We will repeat MRI scan of the brain with contrast to look for any underlying vascular lesion not seen on the initial MRI. Continue home physical occupational and speech therapy. Followup in the future with stroke nurse practitioner in 3 months or call earlier if necessary .

## 2016-01-09 ENCOUNTER — Ambulatory Visit: Payer: Medicare Other | Admitting: Rehabilitative and Restorative Service Providers"

## 2016-01-09 ENCOUNTER — Ambulatory Visit: Payer: Medicare Other

## 2016-01-11 ENCOUNTER — Ambulatory Visit
Admission: RE | Admit: 2016-01-11 | Discharge: 2016-01-11 | Disposition: A | Discharge status: Home / Self Care | Payer: Medicare Other | Source: Ambulatory Visit | Attending: Neurology | Admitting: Neurology

## 2016-01-11 DIAGNOSIS — I611 Nontraumatic intracerebral hemorrhage in hemisphere, cortical: Secondary | ICD-10-CM | POA: Diagnosis not present

## 2016-01-11 MED ORDER — GADOBENATE DIMEGLUMINE 529 MG/ML IV SOLN
6.0000 mL | Freq: Once | INTRAVENOUS | Status: AC | PRN
  Administered 2016-01-11: 6 mL via INTRAVENOUS

## 2016-01-14 NOTE — Telephone Encounter (Addendum)
Son is calling back and would like an FL2 form filled out and faxed to Social Services @[phone] Attn:  Lebron ConnersLanae Anderson. He did call [facility] and they cannot do 24 hr care. Thanks!

## 2016-01-14 NOTE — Telephone Encounter (Signed)
If patient son call before,he needs to call [facility] about the 24 hours care. He can also ask them about some other suggestions.
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
## 2016-01-15 ENCOUNTER — Telehealth: Payer: Self-pay

## 2016-01-15 NOTE — Telephone Encounter (Signed)
Patient's son is calling back stating the FL2 form needs to also be faxed to [facility] at [phone] ATTN: Lorrin Maisindy Mayor.
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
## 2016-01-15 NOTE — Telephone Encounter (Signed)
[facility] Chavis-[facility]is calling for verbal orders to continue nurse visits until June 3rd. Pt's son is having a hard time caring for him. Left message to approve verbal orders.

## 2016-01-15 NOTE — Telephone Encounter (Signed)
Rn call [facility] and lett message for [name] at [phone] to get a report from PT, OT, and speech about what the patients limitations are with care. Son is requesting a FL2 form.

## 2016-01-15 NOTE — Telephone Encounter (Signed)
Dr.Seth reviewed chart on 01/14/2016. Dr.Seth cannot filled out a FL2 form until [facility] on what the patient can and cannot do for himself. Pt was receiving PT, and OT, and speech.

## 2016-01-16 NOTE — Telephone Encounter (Signed)
Rn call Wilma care manager at [facility]. Rn stated patients son wants a FL2 form for his father. Pts son stated he cannot care for his father anymore. Marylouise StacksWilma stated PT discharge the patient on 01/02/2016, also OT saw patient on 01/14/2016 and has another visit on 01/17/2016. Wilma stated the nurse has seen the patient this week too. Marylouise StacksWilma stated she will fax over the last report from PT, OT,and nurse to [phone].

## 2016-01-16 NOTE — Telephone Encounter (Signed)
Rn call  [facility] to speak with [facility] care manager. [facility] stated Marylouise StacksWilma is working from home and not in office. Rn was put on hold and could not get a person. Rn was told she will get a call back, phone number and name was left.

## 2016-01-16 NOTE — Telephone Encounter (Signed)
David Duke/[facility] returned David Duke's call. Please call her (h) [phone]  (c) [phone]

## 2016-01-17 NOTE — Telephone Encounter (Signed)
[facility] and she stated to tax the fl2 and filled out the pts demographic information, medication list, and diagnose and [facility] can fill the rest out. Form fax and sign by Dr. Pearlean BrownieSethi.

## 2016-01-17 NOTE — Telephone Encounter (Signed)
Rn call patients son Chapman MossDavid Jr about the pending application and insurance pending [facility]. PT son express verbal understanding.Rn stated he will be contacted within 3 to 5 business if his father is denied or approve.

## 2016-01-17 NOTE — Telephone Encounter (Signed)
Arline AspCindy Child psychotherapistsocial worker receive FL2 and stated it has been sent to some facilities.

## 2016-01-17 NOTE — Telephone Encounter (Addendum)
Rn call [facility] at [phone] and to David Duke who is Rn at the agency. RN explain to David Duke that patients son wants a Fl2 form. David Duke stated the patient has a Child psychotherapistsocial worker and if the son needs emergency placement because he cannot care for him adult protective services can be involve. Pt has a Child psychotherapistsocial worker at [facility]. David Duke stated the social worker will be given [facility] contact number to call back. David Duke stated if the patient does not meet the criteria for a nursing home, and the caregiver is unable to care, [facility] can be involve. Pt was seen by PT,OT, speech, and Child psychotherapistsocial worker and nurse. Pts PT was David Duke at [phone], PTA was [place]Ben [phone] Duke, speech therapist was [place]ison at [phone], social worker is Administrator, sportsCindy at [phone].OT is David Duke at [phone]. Rn explain that Dr.Sethi will be out of town for conference next week and will not be available to sign anything if needed. David Duke stated the Systems developersocial worker supervisor will be contacting us today. [name] is a Engineer, civil (consulting)nurse at [facility] contact number is [phone].

## 2016-01-17 NOTE — Telephone Encounter (Signed)
Rn had a conference call with Engineer, civil (consulting)Wilma care manager, and Engineer, structuralCindy social worker at [facility]. Arline AspCindy stated the [facility] various nursing facilities. Arline AspCindy stated that [facility] responded and now its insurance pending. Its base on available beds and if patient meets. So its pending based on united healthcare.

## 2016-01-17 NOTE — Telephone Encounter (Signed)
Cindy/[facility] [phone] (f) [phone] wants to insure that the [facility].

## 2016-01-17 NOTE — Telephone Encounter (Signed)
Rn call Wilma to set up conference call with Arline AspCindy about patient. Number is [phone] and code is [phone] press # at the end of the call. Time is set for 0430pm.

## 2016-01-17 NOTE — Telephone Encounter (Addendum)
Wilma with [facility] is calling to set up a conference call with our office, a social worker and herself to discuss the patient. Marylouise StacksWilma can be reached at [phone].

## 2016-01-21 NOTE — Telephone Encounter (Signed)
David Duke with [facility] called and says the son wants to look at other places. David Duke received an email from Child psychotherapistocial Worker today and [facility]. She will call us back to let us know if it goes through.  Phone: [phone]

## 2016-01-22 ENCOUNTER — Encounter: Payer: Self-pay | Admitting: Internal Medicine

## 2016-01-22 ENCOUNTER — Non-Acute Institutional Stay (SKILLED_NURSING_FACILITY): Payer: Medicare Other | Admitting: Internal Medicine

## 2016-01-22 DIAGNOSIS — N400 Enlarged prostate without lower urinary tract symptoms: Secondary | ICD-10-CM | POA: Diagnosis not present

## 2016-01-22 DIAGNOSIS — N2889 Other specified disorders of kidney and ureter: Secondary | ICD-10-CM | POA: Diagnosis not present

## 2016-01-22 DIAGNOSIS — G44209 Tension-type headache, unspecified, not intractable: Secondary | ICD-10-CM

## 2016-01-22 DIAGNOSIS — Z8673 Personal history of transient ischemic attack (TIA), and cerebral infarction without residual deficits: Secondary | ICD-10-CM | POA: Diagnosis not present

## 2016-01-22 DIAGNOSIS — F4323 Adjustment disorder with mixed anxiety and depressed mood: Secondary | ICD-10-CM

## 2016-01-22 DIAGNOSIS — R338 Other retention of urine: Secondary | ICD-10-CM | POA: Diagnosis not present

## 2016-01-22 DIAGNOSIS — N401 Enlarged prostate with lower urinary tract symptoms: Secondary | ICD-10-CM

## 2016-01-22 DIAGNOSIS — J449 Chronic obstructive pulmonary disease, unspecified: Secondary | ICD-10-CM

## 2016-01-22 DIAGNOSIS — R7303 Prediabetes: Secondary | ICD-10-CM | POA: Diagnosis not present

## 2016-01-22 DIAGNOSIS — I69919 Unspecified symptoms and signs involving cognitive functions following unspecified cerebrovascular disease: Secondary | ICD-10-CM | POA: Diagnosis not present
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
## 2016-01-22 NOTE — Progress Notes (Deleted)
Patient ID: David BarlowDavid R Duke, male   DOB: 09/23/1934, 81 y.o.   MRN: [id number]

## 2016-01-23 ENCOUNTER — Encounter: Payer: Self-pay | Admitting: Adult Health

## 2016-01-23 ENCOUNTER — Non-Acute Institutional Stay (SKILLED_NURSING_FACILITY): Payer: Medicare Other | Admitting: Adult Health

## 2016-01-23 DIAGNOSIS — F4323 Adjustment disorder with mixed anxiety and depressed mood: Secondary | ICD-10-CM | POA: Diagnosis not present

## 2016-01-23 DIAGNOSIS — I69919 Unspecified symptoms and signs involving cognitive functions following unspecified cerebrovascular disease: Secondary | ICD-10-CM

## 2016-01-23 NOTE — Progress Notes (Signed)
Patient ID: David BarlowDavid R Gibbon, male   DOB: 05/27/1935, 81 y.o.   MRN: [id number]   [facility]  [facility] Room Number: 109-A Place of Service:  SNF (31)   CODE STATUS: Full Code   No Known Allergies  Chief Complaint  Patient presents with  . Acute Visit    Family Meeting    HPI:  We had a prolonged discussion regarding his medications; especially his long term use of valium. His son was present for the discussion and does agree that lowering his dose will benefit him in the long term. He is not happy with; he is concerned that the lower dose will not be effective in managing his anxiety. He was given reassurance and the nursing staff has setup a schedule to ask him about his level of anxiety. He has agreed to this plan.   Past Medical History  Diagnosis Date  . High cholesterol   . Depression   . Anxiety   . Macular degeneration of both eyes   . Blind right eye   . COPD (chronic obstructive pulmonary disease) (HCC)   . Shortness of breath dyspnea   . Stroke [facility](HCC)     Past Surgical History  Procedure Laterality Date  . No past surgeries      Social History   Social History  . Marital Status: Single    Spouse Name: N/A  . Number of Children: N/A  . Years of Education: N/A   Occupational History  . Not on file.   Social History Main Topics  . Smoking status: Current Every Day Smoker -- 0.50 packs/day for 60 years    Types: Cigarettes  . Smokeless tobacco: Not on file  . Alcohol Use: No  . Drug Use: No  . Sexual Activity: Not on file   Other Topics Concern  . Not on file   Social History Narrative   Family History  Problem Relation Age of Onset  . Colon cancer Mother   . Cerebral aneurysm Father       VITAL SIGNS BP 118/74 mmHg  Pulse 76  Temp(Src) 98.1 F (36.7 C) (Oral)  Resp 17  Ht 5\' 8"  (1.727 m)  Wt 128 lb (58.06 kg)  BMI 19.47 kg/m2  SpO2 98%  Patient's Medications  New Prescriptions   No medications  on file  Previous Medications   ALBUTEROL (PROVENTIL HFA;VENTOLIN HFA) 108 (90 BASE) MCG/ACT INHALER    Inhale 2 puffs into the lungs every 6 (six) hours as needed for wheezing or shortness of breath.   ATORVASTATIN (LIPITOR) 20 MG TABLET    Take 1 tablet (20 mg total) by mouth daily at 6 PM.   DIAZEPAM (VALIUM) 5 MG TABLET    Take 0.5 tablets (2.5 mg total) by mouth every 8 (eight) hours as needed for anxiety.   FAMOTIDINE (PEPCID) 20 MG TABLET    Take 1 tablet (20 mg total) by mouth daily.   FINASTERIDE PO    Take 5 mg by mouth daily.    FLUTICASONE (FLONASE) 50 MCG/ACT NASAL SPRAY    Place 1 spray into both nostrils daily.   SACCHAROMYCES BOULARDII (FLORASTOR) 250 MG CAPSULE    Take 1 capsule (250 mg total) by mouth 2 (two) times daily.   SENNA (SENOKOT) 8.6 MG TABLET    Take 2 tablets by mouth at bedtime as needed for constipation.   SERTRALINE (ZOLOFT) 100 MG TABLET    Take 100 mg by mouth daily.  SILODOSIN (RAPAFLO) 8 MG CAPS CAPSULE    Take 8 mg by mouth daily with breakfast.   TRAMADOL-ACETAMINOPHEN (ULTRACET) 37.5-325 MG TABLET    Take 1 tablet by mouth every 8 (eight) hours as needed (heaches.).  Modified Medications   No medications on file  Discontinued Medications   No medications on file     SIGNIFICANT DIAGNOSTIC EXAMS   LABS REVIEWED:   12-24-15: glucose 105; bun 15; creat 1.78; k+ 3.9; na++136; urine culture: no growth     Review of Systems  Constitutional: Negative for malaise/fatigue.  Respiratory: Negative for cough and shortness of breath.   Cardiovascular: Negative for chest pain, palpitations and leg swelling.  Gastrointestinal: Negative for heartburn, abdominal pain and constipation.  Musculoskeletal: Negative for myalgias, back pain and joint pain.  Skin: Negative.   Neurological: Negative for dizziness.  Psychiatric/Behavioral: Positive for depression. The patient is nervous/anxious.     Physical Exam  Constitutional: No distress.  Eyes:  Conjunctivae are normal.  Neck: Neck supple. No JVD present. No thyromegaly present.  Cardiovascular: Normal rate, regular rhythm and intact distal pulses.   Respiratory: Effort normal and breath sounds normal. No respiratory distress. He has no wheezes.  GI: Soft. Bowel sounds are normal. He exhibits no distension. There is no tenderness.  Musculoskeletal: He exhibits no edema.  Able to move all extremities   Lymphadenopathy:    He has no cervical adenopathy.  Neurological: He is alert. No cranial nerve deficit. Coordination normal.  No resting tremor   Skin: Skin is warm and dry. He is not diaphoretic.  Psychiatric: He has a normal mood and affect.     ASSESSMENT/ PLAN:  1. CVA: is without change neurologically will not make changes will monitor his status.   2. Adjustment disorder with anxiety: will continue zoloft 100 mg daily will continue the valium at 2.5 mg three times daily as needed; the nursing staff is to offer the valium three times daily.   Time spent with patient  45  minutes >50% time spent counseling; reviewing medical record; tests; labs; and developing future plan of care   Synthia Innocent NP [facility]  Contact [phone] Monday through Friday 8am- 5pm  After hours call [phone]

## 2016-01-28 ENCOUNTER — Encounter: Payer: Self-pay | Admitting: Adult Health

## 2016-01-28 ENCOUNTER — Non-Acute Institutional Stay (SKILLED_NURSING_FACILITY): Payer: Medicare Other | Admitting: Adult Health

## 2016-01-28 DIAGNOSIS — J449 Chronic obstructive pulmonary disease, unspecified: Secondary | ICD-10-CM | POA: Diagnosis not present

## 2016-01-28 DIAGNOSIS — F4323 Adjustment disorder with mixed anxiety and depressed mood: Secondary | ICD-10-CM | POA: Diagnosis not present

## 2016-01-28 DIAGNOSIS — I69919 Unspecified symptoms and signs involving cognitive functions following unspecified cerebrovascular disease: Secondary | ICD-10-CM

## 2016-01-28 DIAGNOSIS — Z8673 Personal history of transient ischemic attack (TIA), and cerebral infarction without residual deficits: Secondary | ICD-10-CM

## 2016-01-28 NOTE — Progress Notes (Signed)
Patient ID: David Duke, male   DOB: 1935-02-07, 81 y.o.   MRN: [id number]   Location:  [facility] Room Number: 109-A Place of Service:  SNF (31)   CODE STATUS: Full Code  No Known Allergies  Chief Complaint  Patient presents with  . Discharge Note    Discharge from Facility    HPI:  He is being discharged to home with home health for pt/otrn/ss/st. His family and he has decided to go ahead and return back home. He will not need dme. He will need his prescriptions written and will need to follow up with his medical provider.    Past Medical History  Diagnosis Date  . High cholesterol   . Depression   . Anxiety   . Macular degeneration of both eyes   . Blind right eye   . COPD (chronic obstructive pulmonary disease) (HCC)   . Shortness of breath dyspnea   . Stroke [facility])     Past Surgical History  Procedure Laterality Date  . No past surgeries      Social History   Social History  . Marital Status: Single    Spouse Name: N/A  . Number of Children: N/A  . Years of Education: N/A   Occupational History  . Not on file.   Social History Main Topics  . Smoking status: Current Every Day Smoker -- 0.50 packs/day for 60 years    Types: Cigarettes  . Smokeless tobacco: Not on file  . Alcohol Use: No  . Drug Use: No  . Sexual Activity: Not on file   Other Topics Concern  . Not on file   Social History Narrative   Family History  Problem Relation Age of Onset  . Colon cancer Mother   . Cerebral aneurysm Father       VITAL SIGNS BP 134/76 mmHg  Pulse 86  Temp(Src) 98 F (36.7 C) (Oral)  Resp 18  Ht  (1.727 m)  Wt 128 lb 8 oz (58.287 kg)  BMI 19.54 kg/m2  SpO2 98%  Patient's Medications  New Prescriptions   No medications on file  Previous Medications   ALBUTEROL (PROVENTIL HFA;VENTOLIN HFA) 108 (90 BASE) MCG/ACT INHALER    Inhale 2 puffs into the lungs every 6 (six) hours as needed for wheezing or  shortness of breath.   ATORVASTATIN (LIPITOR) 20 MG TABLET    Take 1 tablet (20 mg total) by mouth daily at 6 PM.   DIAZEPAM (VALIUM) 5 MG TABLET    Take 0.5 tablets (2.5 mg total) by mouth every 8 (eight) hours as needed for anxiety.   FAMOTIDINE (PEPCID) 20 MG TABLET    Take 1 tablet (20 mg total) by mouth daily.   FINASTERIDE PO    Take 5 mg by mouth daily.    FLUTICASONE (FLONASE) 50 MCG/ACT NASAL SPRAY    Place 1 spray into both nostrils daily.   SENNA (SENOKOT) 8.6 MG TABLET    Take 2 tablets by mouth at bedtime as needed for constipation.   SERTRALINE (ZOLOFT) 100 MG TABLET    Take 100 mg by mouth daily.   SILODOSIN (RAPAFLO) 8 MG CAPS CAPSULE    Take 8 mg by mouth daily with breakfast.  Modified Medications   No medications on file  Discontinued Medications   SACCHAROMYCES BOULARDII (FLORASTOR) 250 MG CAPSULE    Take 1 capsule (250 mg total) by mouth 2 (two) times daily.   TRAMADOL-ACETAMINOPHEN (ULTRACET) 37.5-325  MG TABLET    Take 1 tablet by mouth every 8 (eight) hours as needed (heaches.).     SIGNIFICANT DIAGNOSTIC EXAMS   LABS REVIEWED:   12-24-15: glucose 105; bun 15; creat 1.78; k+ 3.9; na++136; urine culture: no growth     Review of Systems  Constitutional: Negative for malaise/fatigue.  Respiratory: Negative for cough and shortness of breath.   Cardiovascular: Negative for chest pain, palpitations and leg swelling.  Gastrointestinal: Negative for heartburn, abdominal pain and constipation.  Musculoskeletal: Negative for myalgias, back pain and joint pain.  Skin: Negative.   Neurological: Negative for dizziness.  Psychiatric/Behavioral: Positive for depression. The patient is nervous/anxious.     Physical Exam  Constitutional: No distress.  Eyes: Conjunctivae are normal.  Neck: Neck supple. No JVD present. No thyromegaly present.  Cardiovascular: Normal rate, regular rhythm and intact distal pulses.   Respiratory: Effort normal and breath sounds normal. No  respiratory distress. He has no wheezes.  GI: Soft. Bowel sounds are normal. He exhibits no distension. There is no tenderness.  Musculoskeletal: He exhibits no edema.  Able to move all extremities   Lymphadenopathy:    He has no cervical adenopathy.  Neurological: He is alert. No cranial nerve deficit. Coordination normal.  No resting tremor   Skin: Skin is warm and dry. He is not diaphoretic.  Psychiatric: He has a normal mood and affect.     ASSESSMENT/ PLAN:  Will discharge him to home with home health for pt/ot/rn/ss/st: to evaluate and treat as indicated for gait; balance; strength; adl training; medication management; cognition; and community services. hsi prescriptions have been written for a 30 day supply of his medications with #15 valium 5 mg tabs. He will not need dme. The facility is aware to setup a follow up appointment with his medical provider 1-2 weeks post discharge.    Time spent with patient  35  minutes >50% time spent counseling; reviewing medical record; tests; labs; and developing future plan of care   Synthia InnocentDeborah Green NP [facility]  Contact [phone] Monday through Friday 8am- 5pm  After hours call [phone]

## 2016-01-29 ENCOUNTER — Ambulatory Visit: Payer: Self-pay | Admitting: Neurology

## 2016-03-14 ENCOUNTER — Other Ambulatory Visit: Payer: Self-pay | Admitting: Physical Medicine and Rehabilitation

## 2016-04-07 ENCOUNTER — Telehealth: Payer: Self-pay | Admitting: Neurology

## 2016-04-07 ENCOUNTER — Ambulatory Visit (INDEPENDENT_AMBULATORY_CARE_PROVIDER_SITE_OTHER): Payer: Medicare Other | Admitting: Nurse Practitioner

## 2016-04-07 ENCOUNTER — Encounter: Payer: Self-pay | Admitting: Nurse Practitioner

## 2016-04-07 VITALS — BP 136/78 | HR 98 | Ht 68.0 in | Wt 137.6 lb

## 2016-04-07 DIAGNOSIS — I69919 Unspecified symptoms and signs involving cognitive functions following unspecified cerebrovascular disease: Secondary | ICD-10-CM

## 2016-04-07 DIAGNOSIS — I612 Nontraumatic intracerebral hemorrhage in hemisphere, unspecified: Secondary | ICD-10-CM

## 2016-04-07 DIAGNOSIS — I69398 Other sequelae of cerebral infarction: Secondary | ICD-10-CM

## 2016-04-07 DIAGNOSIS — R269 Unspecified abnormalities of gait and mobility: Secondary | ICD-10-CM | POA: Diagnosis not present

## 2016-04-07 NOTE — Patient Instructions (Addendum)
Continue  strict control of hypertension with blood pressure goal below 130/90, today's reading 136/78 lipids with LDL cholesterol goal below 70 mg/dL. continue Lipitor  Healthy diet with plenty of whole grains, cereals, fruits and vegetables,  Continue home exercise program  MRI scan of the brain with contrast  in May was stable  Follow-up with her primary care about her lung issues Follow up with [place] in 6 months
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
## 2016-04-07 NOTE — Telephone Encounter (Signed)
Pt's wife, [name], called asking to speak with NP before or after appt alone.

## 2016-04-08 ENCOUNTER — Encounter (HOSPITAL_BASED_OUTPATIENT_CLINIC_OR_DEPARTMENT_OTHER): Payer: Self-pay | Admitting: *Deleted

## 2016-04-08 ENCOUNTER — Emergency Department (HOSPITAL_BASED_OUTPATIENT_CLINIC_OR_DEPARTMENT_OTHER)
Admission: EM | Admit: 2016-04-08 | Discharge: 2016-04-08 | Disposition: A | Discharge status: Home / Self Care | Payer: Medicare Other | Attending: Emergency Medicine | Admitting: Emergency Medicine

## 2016-04-08 ENCOUNTER — Emergency Department (HOSPITAL_BASED_OUTPATIENT_CLINIC_OR_DEPARTMENT_OTHER): Payer: Medicare Other

## 2016-04-08 DIAGNOSIS — J441 Chronic obstructive pulmonary disease with (acute) exacerbation: Secondary | ICD-10-CM | POA: Diagnosis not present

## 2016-04-08 DIAGNOSIS — R0602 Shortness of breath: Secondary | ICD-10-CM | POA: Diagnosis present

## 2016-04-08 DIAGNOSIS — F17210 Nicotine dependence, cigarettes, uncomplicated: Secondary | ICD-10-CM | POA: Diagnosis not present

## 2016-04-08 LAB — BASIC METABOLIC PANEL
ANION GAP: 8 (ref 5–15)
BUN: 12 mg/dL (ref 6–20)
CO2: 28 mmol/L (ref 22–32)
Calcium: 8.9 mg/dL (ref 8.9–10.3)
Chloride: 104 mmol/L (ref 101–111)
Creatinine, Ser: 1.35 mg/dL — ABNORMAL HIGH (ref 0.61–1.24)
GFR calc Af Amer: 56 mL/min — ABNORMAL LOW (ref >60)
GFR, EST NON AFRICAN AMERICAN: 48 mL/min — AB (ref >60)
Glucose, Bld: 110 mg/dL — ABNORMAL HIGH (ref 65–99)
POTASSIUM: 3.7 mmol/L (ref 3.5–5.1)
SODIUM: 140 mmol/L (ref 135–145)

## 2016-04-08 LAB — CBC WITH DIFFERENTIAL/PLATELET
BASOS ABS: 0 10*3/uL (ref 0.0–0.1)
Basophils Relative: 0 %
Eosinophils Absolute: 0.5 10*3/uL (ref 0.0–0.7)
Eosinophils Relative: 7 %
HEMATOCRIT: 42.3 % (ref 39.0–52.0)
Hemoglobin: 14 g/dL (ref 13.0–17.0)
LYMPHS PCT: 25 %
Lymphs Abs: 1.9 10*3/uL (ref 0.7–4.0)
MCH: 30.4 pg (ref 26.0–34.0)
MCHC: 33.1 g/dL (ref 30.0–36.0)
MCV: 91.8 fL (ref 78.0–100.0)
Monocytes Absolute: 0.5 10*3/uL (ref 0.1–1.0)
Monocytes Relative: 7 %
NEUTROS ABS: 4.6 10*3/uL (ref 1.7–7.7)
NEUTROS PCT: 61 %
PLATELETS: 239 10*3/uL (ref 150–400)
RBC: 4.61 MIL/uL (ref 4.22–5.81)
RDW: 13.6 % (ref 11.5–15.5)
WBC: 7.6 10*3/uL (ref 4.0–10.5)

## 2016-04-08 LAB — BRAIN NATRIURETIC PEPTIDE: B NATRIURETIC PEPTIDE 5: 14.5 pg/mL (ref 0.0–100.0)

## 2016-04-08 LAB — TROPONIN I: Troponin I: <0.03 ng/mL (ref <0.03)

## 2016-04-08 MED ORDER — ALBUTEROL SULFATE (2.5 MG/3ML) 0.083% IN NEBU
5.0000 mg | INHALATION_SOLUTION | Freq: Once | RESPIRATORY_TRACT | Status: AC
Start: 2016-04-08 — End: 2016-04-08
  Administered 2016-04-08: 5 mg via RESPIRATORY_TRACT
  Filled 2016-04-08: qty 6

## 2016-04-08 MED ORDER — PREDNISONE 20 MG PO TABS: 40.0000 mg | ORAL_TABLET | Freq: Every day | ORAL | 0 refills | Status: DC

## 2016-04-08 MED ORDER — IPRATROPIUM-ALBUTEROL 0.5-2.5 (3) MG/3ML IN SOLN
RESPIRATORY_TRACT | Status: AC
  Administered 2016-04-08: 3 mL
  Filled 2016-04-08: qty 3

## 2016-04-08 MED ORDER — ALBUTEROL SULFATE HFA 108 (90 BASE) MCG/ACT IN AERS: 2.0000 | INHALATION_SPRAY | RESPIRATORY_TRACT | 0 refills | Status: DC | PRN

## 2016-04-08 MED ORDER — METHYLPREDNISOLONE SODIUM SUCC 125 MG IJ SOLR
125.0000 mg | Freq: Once | INTRAMUSCULAR | Status: AC
  Administered 2016-04-08: 125 mg via INTRAVENOUS
  Filled 2016-04-08: qty 2

## 2016-04-08 MED ORDER — ALBUTEROL SULFATE (2.5 MG/3ML) 0.083% IN NEBU
INHALATION_SOLUTION | RESPIRATORY_TRACT | Status: AC
  Administered 2016-04-08: 2.5 mg
  Filled 2016-04-08: qty 3

## 2016-04-08 MED FILL — PROAIR HFA 90 MCG INHALER: 108 (90 BAS | 17 days supply | Qty: 9 | Fill #0

## 2016-04-08 MED FILL — predniSONE 20 MG TABS: 20 | 5 days supply | Qty: 10 | Fill #0

## 2016-04-08 NOTE — ED Triage Notes (Signed)
Sob x 1 week. States he was in the hospital and rehab for 3 months after having a stroke.

## 2016-04-08 NOTE — ED Provider Notes (Signed)
Medical screening examination/treatment/procedure(s) were conducted as a shared visit with non-physician practitioner(s) and myself.  I personally evaluated the patient during the encounter.   EKG Interpretation  Date/Time:  Tuesday April 08 2016 13:55:39 EDT Ventricular Rate:  97 PR Interval:    QRS Duration: 87 QT Interval:  343 QTC Calculation: 436 R Axis:   -83 Text Interpretation:  Sinus rhythm Left anterior fascicular block Right ventricular hypertrophy No significant change since last tracing Confirmed by Enolia Koepke  MD, Taishawn Smaldone [phone]) on 04/08/2016 2:01:06 PM      Results for orders placed or performed during the hospital encounter of 04/08/16  Basic metabolic panel  Result Value Ref Range   Sodium 140 135 - 145 mmol/L   Potassium 3.7 3.5 - 5.1 mmol/L   Chloride 104 101 - 111 mmol/L   CO2 28 22 - 32 mmol/L   Glucose, Bld 110 (H) 65 - 99 mg/dL   BUN 12 6 - 20 mg/dL   Creatinine, Ser 1.94 (H) 0.61 - 1.24 mg/dL   Calcium 8.9 8.9 - 17.4 mg/dL   GFR calc non Af Amer 48 (L) >60 mL/min   GFR calc Af Amer 56 (L) >60 mL/min   Anion gap 8 5 - 15  CBC with Differential  Result Value Ref Range   WBC 7.6 4.0 - 10.5 K/uL   RBC 4.61 4.22 - 5.81 MIL/uL   Hemoglobin 14.0 13.0 - 17.0 g/dL   HCT 08.1 44.8 - 18.5 %   MCV 91.8 78.0 - 100.0 fL   MCH 30.4 26.0 - 34.0 pg   MCHC 33.1 30.0 - 36.0 g/dL   RDW 63.1 49.7 - 02.6 %   Platelets 239 150 - 400 K/uL   Neutrophils Relative % 61 %   Neutro Abs 4.6 1.7 - 7.7 K/uL   Lymphocytes Relative 25 %   Lymphs Abs 1.9 0.7 - 4.0 K/uL   Monocytes Relative 7 %   Monocytes Absolute 0.5 0.1 - 1.0 K/uL   Eosinophils Relative 7 %   Eosinophils Absolute 0.5 0.0 - 0.7 K/uL   Basophils Relative 0 %   Basophils Absolute 0.0 0.0 - 0.1 K/uL  Troponin I  Result Value Ref Range   Troponin I <0.03 <0.03 ng/mL   Dg Chest 2 View  Result Date: 04/08/2016 CLINICAL DATA:  Cough, wheezing. EXAM: CHEST  2 VIEW COMPARISON:  Radiographs of November 28, 2015.  FINDINGS: The heart size and mediastinal contours are within normal limits. Both lungs are clear. No pneumothorax or pleural effusion is noted. The visualized skeletal structures are unremarkable. IMPRESSION: No active cardiopulmonary disease. Electronically Signed   By: David Duke, M.D.   On: 04/08/2016 14:36    Patient with history of COPD. Said increased shortness of breath and still has some persistent wheezing on the right side of his chest. Patient also with dimension secondary to a head bleed that occurred in the spring. Patient at times does get violent as per his spouse. But she has a hot line and has some counseling is going on given her coping skills. Patient here oxygen saturations are good there in the upper 90s. Chest x-ray negative for pneumonia or pneumothorax. Labs without significant abnormalities. Patient given some IV steroids here and probably would benefit from a course of prednisone. Patient is using his albuterol inhaler and it does give him relief when he uses it.  Here currently patient is alert good speech low bit of confusion on timing of events but the able to  converse properly and follow commands.      David Mulders, MD 04/08/16 1452

## 2016-04-08 NOTE — ED Provider Notes (Signed)
[facility] Provider Note   CSN: [id number] Arrival date & time: 04/08/16  1344  First Provider Contact:  None       History   Chief Complaint Chief Complaint  Patient presents with  . Shortness of Breath    HPI David Duke is a 81 y.o. male.  HPI   Patient with hx COPD, dementia, stroke, macular degeneration, HLD p/w increased SOB and productive cough x 1 week.  Hears wheezing at night.  Feels his breathing is better with lying flat.  Also improved with albuterol inhaler.  Sputum has no color, is thick and occasionally as big as a 1/2 dollar. Denies fevers, chest pain, leg swelling.  Pt has urinary catheter with normal output, no concerns -last changed July 10.   History obtained from patient and family member. Level V caveat for dementia.    Past Medical History:  Diagnosis Date  . Anxiety   . Blind right eye   . COPD (chronic obstructive pulmonary disease) (HCC)   . Depression   . High cholesterol   . Macular degeneration of both eyes   . Shortness of breath dyspnea   . Stroke [facility])     Patient Active Problem List   Diagnosis Date Noted  . History of stroke in prior 3 months 01/22/2016  . Chronic tension-type headache, not intractable   . Acute frontal sinusitis   . 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 Gait disturbance, post-stroke   . Ataxia, late effect of cerebrovascular disease   . BPH (benign prostatic hyperplasia)   . Urinary retention   . Adjustment disorder with mixed anxiety and depressed mood   . Chronic obstructive pulmonary disease (HCC)   . Cephalalgia   . AKI (acute kidney injury) (HCC)   . Macular degeneration   . Prediabetes   . Hypokalemia   . Absolute anemia   . Aphasia   . Acute encephalopathy 11/25/2015  . Hyponatremia 11/25/2015  . Acute kidney injury (HCC) 11/25/2015  . Depression   . Anxiety   . COPD (chronic obstructive pulmonary disease) (HCC)   . ICH (intracerebral hemorrhage)  (HCC) 11/22/2015    Past Surgical History:  Procedure Laterality Date  . NO PAST SURGERIES         Home Medications    Prior to Admission medications   Medication Sig Start Date End Date Taking? Authorizing Provider  albuterol (PROVENTIL HFA;VENTOLIN HFA) 108 (90 Base) MCG/ACT inhaler Inhale 2 puffs into the lungs every 6 (six) hours as needed for wheezing or shortness of breath.    Historical Provider, MD  atorvastatin (LIPITOR) 20 MG tablet Take 1 tablet (20 mg total) by mouth daily at 6 PM. 12/11/15   [name] Love, PA-C  diazepam (VALIUM) 5 MG tablet Take 0.5 tablets (2.5 mg total) by mouth every 8 (eight) hours as needed for anxiety. 12/11/15   [name] Love, PA-C  FINASTERIDE PO Take 5 mg by mouth daily.     Historical Provider, MD  fluticasone (FLONASE) 50 MCG/ACT nasal spray Place 1 spray into both nostrils daily. 12/11/15   Jacquelynn Cree, PA-C  senna (SENOKOT) 8.6 MG tablet Take 2 tablets by mouth at bedtime as needed for constipation.    Historical Provider, MD  silodosin (RAPAFLO) 8 MG CAPS capsule Take 8 mg by mouth daily with breakfast.    Historical Provider, MD    Family History Family History  Problem Relation Age of Onset  . Colon  cancer Mother   . Cerebral aneurysm Father     Social History Social History  Substance Use Topics  . Smoking status: Current Every Day Smoker    Packs/day: 0.50    Years: 60.00    Types: Cigarettes  . Smokeless tobacco: Never Used  . Alcohol use No     Allergies   Penicillins   Review of Systems Review of Systems  Unable to perform ROS: Dementia     Physical Exam Updated Vital Signs BP 164/95   Pulse 102   Temp 97.9 F (36.6 C) (Oral)   Resp 21   Ht 5\' 8"  (1.727 m)   Wt 62.1 kg   SpO2 100%   BMI 20.83 kg/m   Physical Exam  Constitutional: He appears well-developed and well-nourished. No distress.  HENT:  Head: Normocephalic and atraumatic.  Neck: Neck supple.  Cardiovascular: Normal rate and regular rhythm.    Pulmonary/Chest: Tachypnea noted. No respiratory distress. He has decreased breath sounds. He has wheezes. He has no rales.  Abdominal: Soft. He exhibits no distension and no mass. There is no tenderness. There is no rebound and no guarding.  Neurological: He is alert. He exhibits normal muscle tone.  Skin: He is not diaphoretic.  Nursing note and vitals reviewed.    ED Treatments / Results  Labs (all labs ordered are listed, but only abnormal results are displayed) Labs Reviewed  BASIC METABOLIC PANEL - Abnormal; Notable for the following:       Result Value   Glucose, Bld 110 (*)    Creatinine, Ser 1.35 (*)    GFR calc non Af Amer 48 (*)    GFR calc Af Amer 56 (*)    All other components within normal limits  CBC WITH DIFFERENTIAL/PLATELET  TROPONIN I  BRAIN NATRIURETIC PEPTIDE    EKG  EKG Interpretation  Date/Time:  Tuesday April 08 2016 13:55:39 EDT Ventricular Rate:  97 PR Interval:    QRS Duration: 87 QT Interval:  343 QTC Calculation: 436 R Axis:   -83 Text Interpretation:  Sinus rhythm Left anterior fascicular block Right ventricular hypertrophy No significant change since last tracing Confirmed by ZACKOWSKI  MD, SCOTT [phone]) on 04/08/2016 2:01:06 PM       Radiology Dg Chest 2 View  Result Date: 04/08/2016 CLINICAL DATA:  Cough, wheezing. EXAM: CHEST  2 VIEW COMPARISON:  Radiographs of November 28, 2015. FINDINGS: The heart size and mediastinal contours are within normal limits. Both lungs are clear. No pneumothorax or pleural effusion is noted. The visualized skeletal structures are unremarkable. IMPRESSION: No active cardiopulmonary disease. Electronically Signed   By: Lupita Raider, M.D.   On: 04/08/2016 14:36    Procedures Procedures (including critical care time)  Medications Ordered in ED Medications  albuterol (PROVENTIL) (2.5 MG/3ML) 0.083% nebulizer solution (2.5 mg  Given 04/08/16 1410)  ipratropium-albuterol (DUONEB) 0.5-2.5 (3) MG/3ML nebulizer  solution (3 mLs  Given 04/08/16 1410)  methylPREDNISolone sodium succinate (SOLU-MEDROL) 125 mg/2 mL injection 125 mg (125 mg Intravenous Given 04/08/16 1429)  albuterol (PROVENTIL) (2.5 MG/3ML) 0.083% nebulizer solution 5 mg (5 mg Nebulization Given 04/08/16 1515)     Initial Impression / Assessment and Plan / ED Course  I have reviewed the triage vital signs and the nursing notes.  Pertinent labs & imaging results that were available during my care of the patient were reviewed by me and considered in my medical decision making (see chart for details).  Clinical Course    Afebrile nontoxic  patient with hx COPD p/w 1 week increased SOB and wheezing,productive cough.  Nebs, steroids with great improvement.  Lungs CTAB following treatment, pt reports feeling better.  Labs unremarkable.  CXR negative.  Pt also seen by Dr Deretha Emory who agrees with workup and plan.  D/C home with prednisone, albuterol, close PCP follow up.  Discussed result, findings, treatment, and follow up  with patient.  Pt given return precautions.  Pt verbalizes understanding and agrees with plan.       Final Clinical Impressions(s) / ED Diagnoses   Final diagnoses:  COPD exacerbation (HCC)    New Prescriptions New Prescriptions   ALBUTEROL (PROVENTIL HFA;VENTOLIN HFA) 108 (90 BASE) MCG/ACT INHALER    Inhale 2 puffs into the lungs every 4 (four) hours as needed for wheezing or shortness of breath (or cough).   PREDNISONE (DELTASONE) 20 MG TABLET    Take 2 tablets (40 mg total) by mouth daily with breakfast.     Trixie Dredge, PA-C 04/08/16 1536    [name], MD 04/11/16 [phone]

## 2016-04-08 NOTE — Discharge Instructions (Signed)
Read the information below.  Use the prescribed medication as directed.  Please discuss all new medications with your pharmacist.  You may return to the Emergency Department at any time for worsening condition or any new symptoms that concern you.   If you develop worsening shortness of breath, uncontrolled wheezing, severe chest pain, or fevers despite using tylenol and/or ibuprofen, return for a recheck.     °

## 2016-04-15 NOTE — Progress Notes (Signed)
I agree with the above plan 

## 2016-04-27 ENCOUNTER — Emergency Department (HOSPITAL_BASED_OUTPATIENT_CLINIC_OR_DEPARTMENT_OTHER)
Admission: EM | Admit: 2016-04-27 | Discharge: 2016-04-28 | Disposition: A | Discharge status: Home / Self Care | Payer: Medicare Other | Attending: Emergency Medicine | Admitting: Emergency Medicine

## 2016-04-27 ENCOUNTER — Encounter (HOSPITAL_BASED_OUTPATIENT_CLINIC_OR_DEPARTMENT_OTHER): Payer: Self-pay | Admitting: Emergency Medicine

## 2016-04-27 DIAGNOSIS — J449 Chronic obstructive pulmonary disease, unspecified: Secondary | ICD-10-CM | POA: Insufficient documentation

## 2016-04-27 DIAGNOSIS — R609 Edema, unspecified: Secondary | ICD-10-CM

## 2016-04-27 DIAGNOSIS — Z8673 Personal history of transient ischemic attack (TIA), and cerebral infarction without residual deficits: Secondary | ICD-10-CM | POA: Insufficient documentation

## 2016-04-27 DIAGNOSIS — R339 Retention of urine, unspecified: Secondary | ICD-10-CM | POA: Diagnosis not present

## 2016-04-27 DIAGNOSIS — F17210 Nicotine dependence, cigarettes, uncomplicated: Secondary | ICD-10-CM | POA: Insufficient documentation

## 2016-04-27 DIAGNOSIS — R600 Localized edema: Secondary | ICD-10-CM | POA: Diagnosis not present

## 2016-04-27 DIAGNOSIS — M7989 Other specified soft tissue disorders: Secondary | ICD-10-CM | POA: Diagnosis present

## 2016-04-27 LAB — URINE MICROSCOPIC-ADD ON

## 2016-04-27 LAB — URINALYSIS, ROUTINE W REFLEX MICROSCOPIC
BILIRUBIN URINE: NEGATIVE
Glucose, UA: NEGATIVE mg/dL
KETONES UR: NEGATIVE mg/dL
NITRITE: POSITIVE — AB
PH: 7.5 (ref 5.0–8.0)
Protein, ur: NEGATIVE mg/dL
SPECIFIC GRAVITY, URINE: 1.012 (ref 1.005–1.030)

## 2016-04-27 NOTE — ED Triage Notes (Signed)
Pt c/o bilateral leg swelling, no swelling noted.

## 2016-04-27 NOTE — ED Provider Notes (Signed)
[facility]   CSN: [id number] Arrival date & time: 04/27/16  2014 By signing my name below, I, Levon HedgerElizabeth Hall, attest that this documentation has been prepared under the direction and in the presence of Zadie Rhineonald Anhad Sheeley, MD . Electronically Signed: Levon HedgerElizabeth Hall, Scribe. 04/27/2016. 11:40 PM.   History   Chief Complaint Chief Complaint  Patient presents with  . Leg Swelling    HPI David Duke is a 81 y.o. male with hx of CVA, COPD, dementia, macular degeneration and HLD who presents to the Emergency Department complaining of sudden onset, moderate bilateral leg swelling from mid-shin to ankle which began at 11 this morning. He notes associated leg pain. No alleviating or modifying factors noted.  Pt states he has never experienced leg swelling before. He denies any hx of blood clots or cardiac issues. Pt had CVA on 11/22/15.  [name]. He has had foley catheter in place since CVA. Pt is followed by his urologist, [place] Bro[place], [place] [facility] [place] who changed bag on 04/17/16.  He denies fever, vomiting, CP, or SOB.   The history is provided by the patient and the spouse. No language interpreter was used.    Past Medical History:  Diagnosis Date  . Anxiety   . Blind right eye   . COPD (chronic obstructive pulmonary disease) (HCC)   . Depression   . High cholesterol   . Macular degeneration of both eyes   . Shortness of breath dyspnea   . Stroke [facility](HCC)     Patient Active Problem List   Diagnosis Date Noted  . History of stroke in prior 3 months 01/22/2016  . Chronic tension-type headache, not intractable   . Acute frontal sinusitis   . 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 Gait disturbance, post-stroke   . Ataxia, late effect of cerebrovascular disease   . BPH (benign prostatic hyperplasia)   . Urinary retention   . Adjustment disorder with mixed anxiety and depressed mood   . Chronic obstructive pulmonary disease (HCC)   . Cephalalgia     . AKI (acute kidney injury) (HCC)   . Macular degeneration   . Prediabetes   . Hypokalemia   . Absolute anemia   . Aphasia   . Acute encephalopathy 11/25/2015  . Hyponatremia 11/25/2015  . Acute kidney injury (HCC) 11/25/2015  . Depression   . Anxiety   . COPD (chronic obstructive pulmonary disease) (HCC)   . ICH (intracerebral hemorrhage) (HCC) 11/22/2015    Past Surgical History:  Procedure Laterality Date  . NO PAST SURGERIES        Home Medications    Prior to Admission medications   Medication Sig Start Date End Date Taking? Authorizing Provider  albuterol (PROVENTIL HFA;VENTOLIN HFA) 108 (90 Base) MCG/ACT inhaler Inhale 2 puffs into the lungs every 6 (six) hours as needed for wheezing or shortness of breath.    Historical Provider, MD  albuterol (PROVENTIL HFA;VENTOLIN HFA) 108 (90 Base) MCG/ACT inhaler Inhale 2 puffs into the lungs every 4 (four) hours as needed for wheezing or shortness of breath (or cough). 04/08/16   Trixie DredgeEmily West, PA-C  atorvastatin (LIPITOR) 20 MG tablet Take 1 tablet (20 mg total) by mouth daily at 6 PM. 12/11/15   Evlyn KannerPamela S Love, PA-C  diazepam (VALIUM) 5 MG tablet Take 0.5 tablets (2.5 mg total) by mouth every 8 (eight) hours as needed for anxiety. 12/11/15   Evlyn KannerPamela S Love, PA-C  FINASTERIDE PO Take 5 mg  by mouth daily.     Historical Provider, MD  fluticasone (FLONASE) 50 MCG/ACT nasal spray Place 1 spray into both nostrils daily. 12/11/15   Jacquelynn Cree, PA-C  predniSONE (DELTASONE) 20 MG tablet Take 2 tablets (40 mg total) by mouth daily with breakfast. 04/08/16   Trixie Dredge, PA-C  senna (SENOKOT) 8.6 MG tablet Take 2 tablets by mouth at bedtime as needed for constipation.    Historical Provider, MD  silodosin (RAPAFLO) 8 MG CAPS capsule Take 8 mg by mouth daily with breakfast.    Historical Provider, MD    Family History Family History  Problem Relation Age of Onset  . Colon cancer Mother   . Cerebral aneurysm Father     Social History Social  History  Substance Use Topics  . Smoking status: Current Every Day Smoker    Packs/day: 0.50    Years: 60.00    Types: Cigarettes  . Smokeless tobacco: Never Used  . Alcohol use No     Allergies   Penicillins   Review of Systems Review of Systems  Constitutional: Negative for fever.  Respiratory: Negative for shortness of breath.   Cardiovascular: Negative for chest pain.  All other systems reviewed and are negative.   Physical Exam Updated Vital Signs BP 164/90 (BP Location: Right Arm)   Pulse 114   Temp 99.1 F (37.3 C) (Oral)   Resp 18   Ht 5\' 8"  (1.727 m)   Wt 142 lb (64.4 kg)   SpO2 97%   BMI 21.59 kg/m   Physical Exam CONSTITUTIONAL: Elderly, disheveled.  HEAD: Normocephalic/atraumatic EYES: EOMI ENMT: Mucous membranes moist NECK: supple no meningeal signs CV: S1/S2 noted, no murmurs/rubs/gallops noted LUNGS: Lungs are clear to auscultation bilaterally, no apparent distress ABDOMEN: soft, nontender GU:no cva tenderness. Foley catheter in place.  NEURO: Pt is awake/alert/appropriate, moves all extremitiesx4.  EXTREMITIES: pulses normal/equal, full ROM. Right calf tenderness. Mild pitting edema to RLE. No erythema. DP intact. LLE without edema or calf tenderness.  SKIN: warm, color normal PSYCH: no abnormalities of mood noted, alert and oriented to situation   ED Treatments / Results  DIAGNOSTIC STUDIES:  Oxygen Saturation is 97% on RA, normal by my interpretation.    COORDINATION OF CARE:  11:38 PM Discussed treatment plan which includes next day [place] with pt at bedside and pt agreed to plan.  Labs (all labs ordered are listed, but only abnormal results are displayed) Labs Reviewed  URINALYSIS, ROUTINE W REFLEX MICROSCOPIC (NOT AT [facility]) - Abnormal; Notable for the following:       Result Value   APPearance CLOUDY (*)    Hgb urine dipstick TRACE (*)    Nitrite POSITIVE (*)    Leukocytes, UA LARGE (*)    All other components within normal limits    URINE MICROSCOPIC-ADD ON - Abnormal; Notable for the following:    Squamous Epithelial / LPF 0-5 (*)    Bacteria, UA FEW (*)    All other components within normal limits    EKG  EKG Interpretation None       Radiology No results found.  Procedures Procedures (including critical care time)  Medications Ordered in ED Medications - No data to display   Initial Impression / Assessment and Plan / ED Course  I have reviewed the triage vital signs and the nursing notes.  Pertinent labs  results that were available during my care of the patient were reviewed by me and considered in my medical decision making (see  chart for details).  Clinical Course    On exam, right LE has minimal edema and calf tenderness. Will obtain next day DVT study.  Will defer prophylactic anticoagulation due to previous h/o hemorrhagic stroke.    Urinalysis was sent prior to my evaluation.  Pt has indwelling foley and no bacteria or WBCs noted on u/a.  He can f/u with urology for any other foley concerns  Final Clinical Impressions(s) / ED Diagnoses   Final diagnoses:  Peripheral edema   I personally performed the services described in this documentation, which was scribed in my presence. The recorded information has been reviewed and is accurate.      New Prescriptions New Prescriptions   No medications on file     Zadie Rhineonald Blue Ruggerio, MD 04/28/16 [phone]

## 2016-04-28 ENCOUNTER — Ambulatory Visit (HOSPITAL_BASED_OUTPATIENT_CLINIC_OR_DEPARTMENT_OTHER)
Admission: RE | Admit: 2016-04-28 | Discharge: 2016-04-28 | Disposition: A | Discharge status: Home / Self Care | Payer: Medicare Other | Source: Ambulatory Visit | Attending: Emergency Medicine | Admitting: Emergency Medicine

## 2016-04-28 DIAGNOSIS — M79661 Pain in right lower leg: Secondary | ICD-10-CM

## 2016-04-28 NOTE — ED Notes (Signed)
Pt given d/c instructions as per chart. Verbalizes understanding. No questions. 

## 2016-04-28 NOTE — ED Provider Notes (Signed)
Urine culture added on after discharge   David Rhineonald Lis Savitt, MD 04/28/16 [phone]

## 2016-04-28 NOTE — ED Notes (Addendum)
Pt returned for outpatient ultrasound today to r/o dvt. US report reviewed by Dr Ethelda ChickJacubowitz. Pt advised negative for DVT and instructed to f/u with PCP and per Dr. Nelda MarseilleWickline's order. Pt's wife present with pt and verbalizes understanding. Spoke with patient at 15:50 on April 28, 2016.

## 2016-04-29 LAB — URINE CULTURE

## 2016-05-18 ENCOUNTER — Emergency Department (HOSPITAL_BASED_OUTPATIENT_CLINIC_OR_DEPARTMENT_OTHER)
Admission: EM | Admit: 2016-05-18 | Discharge: 2016-05-18 | Disposition: A | Discharge status: Home / Self Care | Payer: Medicare Other | Attending: Physician Assistant | Admitting: Physician Assistant

## 2016-05-18 ENCOUNTER — Encounter (HOSPITAL_BASED_OUTPATIENT_CLINIC_OR_DEPARTMENT_OTHER): Payer: Self-pay | Admitting: Emergency Medicine

## 2016-05-18 DIAGNOSIS — J449 Chronic obstructive pulmonary disease, unspecified: Secondary | ICD-10-CM | POA: Diagnosis not present

## 2016-05-18 DIAGNOSIS — Y999 Unspecified external cause status: Secondary | ICD-10-CM | POA: Diagnosis not present

## 2016-05-18 DIAGNOSIS — Y939 Activity, unspecified: Secondary | ICD-10-CM | POA: Insufficient documentation

## 2016-05-18 DIAGNOSIS — S40812A Abrasion of left upper arm, initial encounter: Secondary | ICD-10-CM | POA: Diagnosis not present

## 2016-05-18 DIAGNOSIS — Z8673 Personal history of transient ischemic attack (TIA), and cerebral infarction without residual deficits: Secondary | ICD-10-CM | POA: Insufficient documentation

## 2016-05-18 DIAGNOSIS — F17210 Nicotine dependence, cigarettes, uncomplicated: Secondary | ICD-10-CM | POA: Insufficient documentation

## 2016-05-18 DIAGNOSIS — Z23 Encounter for immunization: Secondary | ICD-10-CM | POA: Diagnosis not present

## 2016-05-18 DIAGNOSIS — Z7951 Long term (current) use of inhaled steroids: Secondary | ICD-10-CM | POA: Diagnosis not present

## 2016-05-18 DIAGNOSIS — S40811A Abrasion of right upper arm, initial encounter: Secondary | ICD-10-CM | POA: Diagnosis not present

## 2016-05-18 DIAGNOSIS — Z79899 Other long term (current) drug therapy: Secondary | ICD-10-CM | POA: Insufficient documentation

## 2016-05-18 DIAGNOSIS — Y929 Unspecified place or not applicable: Secondary | ICD-10-CM | POA: Diagnosis not present

## 2016-05-18 DIAGNOSIS — S4991XA Unspecified injury of right shoulder and upper arm, initial encounter: Secondary | ICD-10-CM | POA: Diagnosis present

## 2016-05-18 DIAGNOSIS — T07XXXA Unspecified multiple injuries, initial encounter: Secondary | ICD-10-CM

## 2016-05-18 DIAGNOSIS — W5503XA Scratched by cat, initial encounter: Secondary | ICD-10-CM | POA: Diagnosis not present

## 2016-05-18 MED ORDER — AMOXICILLIN-POT CLAVULANATE 875-125 MG PO TABS: 1.0000 | ORAL_TABLET | Freq: Two times a day (BID) | ORAL | 0 refills | Status: DC

## 2016-05-18 MED ORDER — TETANUS-DIPHTH-ACELL PERTUSSIS 5-2.5-18.5 LF-MCG/0.5 IM SUSP
0.5000 mL | Freq: Once | INTRAMUSCULAR | Status: AC
  Administered 2016-05-18: 0.5 mL via INTRAMUSCULAR
  Filled 2016-05-18: qty 0.5

## 2016-05-18 NOTE — ED Provider Notes (Signed)
[facility] Provider [facility]   CSN: [id number] Arrival date & time: 05/18/16  1349     History   Chief Complaint Chief Complaint  Patient presents with  . Wound Check    left and right arms, scratched by cat    HPI David Duke is a 81 y.o. male.  The history is provided by the patient. No language interpreter was used.  Wound Check  This is a new problem. The current episode started yesterday. The problem occurs constantly. The problem has not changed since onset.Pertinent negatives include no shortness of breath. Nothing aggravates the symptoms. Nothing relieves the symptoms. He has tried nothing for the symptoms. The treatment provided no relief.  Pt reports family cat scratched him.  Pt reports multiple scratches on arms.  Cat has had rabies shot.  Pt is unsure of his last tetanus.   Past Medical History:  Diagnosis Date  . Anxiety   . Blind right eye   . COPD (chronic obstructive pulmonary disease) (HCC)   . Depression   . High cholesterol   . Macular degeneration of both eyes   . Shortness of breath dyspnea   . Stroke [facility](HCC)     Patient Active Problem List   Diagnosis Date Noted  . History of stroke in prior 3 months 01/22/2016  . Chronic tension-type headache, not intractable   . Acute frontal sinusitis   . 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 Gait disturbance, post-stroke   . Ataxia, late effect of cerebrovascular disease   . BPH (benign prostatic hyperplasia)   . Urinary retention   . Adjustment disorder with mixed anxiety and depressed mood   . Chronic obstructive pulmonary disease (HCC)   . Cephalalgia   . AKI (acute kidney injury) (HCC)   . Macular degeneration   . Prediabetes   . Hypokalemia   . Absolute anemia   . Aphasia   . Acute encephalopathy 11/25/2015  . Hyponatremia 11/25/2015  . Acute kidney injury (HCC) 11/25/2015  . Depression   . Anxiety   . COPD (chronic obstructive pulmonary disease) (HCC)    . ICH (intracerebral hemorrhage) (HCC) 11/22/2015    Past Surgical History:  Procedure Laterality Date  . NO PAST SURGERIES         Home Medications    Prior to Admission medications   Medication Sig Start Date End Date Taking? Authorizing Provider  albuterol (PROVENTIL HFA;VENTOLIN HFA) 108 (90 Base) MCG/ACT inhaler Inhale 2 puffs into the lungs every 6 (six) hours as needed for wheezing or shortness of breath.   Yes Historical Provider, MD  albuterol (PROVENTIL HFA;VENTOLIN HFA) 108 (90 Base) MCG/ACT inhaler Inhale 2 puffs into the lungs every 4 (four) hours as needed for wheezing or shortness of breath (or cough). 04/08/16  Yes Trixie DredgeEmily West, PA-C  atorvastatin (LIPITOR) 20 MG tablet Take 1 tablet (20 mg total) by mouth daily at 6 PM. 12/11/15  Yes Evlyn KannerPamela S Love, PA-C  diazepam (VALIUM) 5 MG tablet Take 0.5 tablets (2.5 mg total) by mouth every 8 (eight) hours as needed for anxiety. 12/11/15  Yes Evlyn KannerPamela S Love, PA-C  FINASTERIDE PO Take 5 mg by mouth daily.    Yes Historical Provider, MD  fluticasone (FLONASE) 50 MCG/ACT nasal spray Place 1 spray into both nostrils daily. 12/11/15  Yes Evlyn KannerPamela S Love, PA-C  predniSONE (DELTASONE) 20 MG tablet Take 2 tablets (40 mg total) by mouth daily with breakfast. 04/08/16  Yes Trixie DredgeEmily West, PA-C  senna (SENOKOT) 8.6 MG tablet Take 2 tablets by mouth at bedtime as needed for constipation.   Yes Historical Provider, MD  amoxicillin-clavulanate (AUGMENTIN) 875-125 MG tablet Take 1 tablet by mouth every 12 (twelve) hours. 05/18/16   Elson Areas, PA-C  silodosin (RAPAFLO) 8 MG CAPS capsule Take 8 mg by mouth daily with breakfast.    Historical Provider, MD    Family History Family History  Problem Relation Age of Onset  . Colon cancer Mother   . Cerebral aneurysm Father     Social History Social History  Substance Use Topics  . Smoking status: Current Every Day Smoker    Packs/day: 0.50    Years: 60.00    Types: Cigarettes  . Smokeless tobacco: Never  Used  . Alcohol use No     Allergies   Penicillins   Review of Systems Review of Systems  Respiratory: Negative for shortness of breath.   All other systems reviewed and are negative.    Physical Exam Updated Vital Signs BP 128/80 (BP Location: Right Arm)   Pulse 104   Temp 98.1 F (36.7 C) (Oral)   Resp 20   Ht 5\' 8"  (1.727 m)   Wt 65.8 kg   SpO2 98%   BMI 22.05 kg/m   Physical Exam  Constitutional: He appears well-developed and well-nourished.  HENT:  Head: Normocephalic and atraumatic.  Eyes: Conjunctivae are normal.  Neck: Neck supple.  Cardiovascular: Normal rate and regular rhythm.   No murmur heard. Pulmonary/Chest: Effort normal and breath sounds normal. No respiratory distress.  Abdominal: Soft. There is no tenderness.  Musculoskeletal: He exhibits no edema.  Multiple abrasions arms.  Neurological: He is alert.  Skin: Skin is warm and dry.  Psychiatric: He has a normal mood and affect.  Nursing note and vitals reviewed.    ED Treatments / Results  Labs (all labs ordered are listed, but only abnormal results are displayed) Labs Reviewed - No data to display  EKG  EKG Interpretation None       Radiology No results found.  Procedures Procedures (including critical care time)  Medications Ordered in ED Medications  Tdap (BOOSTRIX) injection 0.5 mL (0.5 mLs Intramuscular Given 05/18/16 1522)     Initial Impression / Assessment and Plan / ED Course  I have reviewed the triage vital signs and the nursing notes.  Pertinent labs & imaging results that were available during my care of the patient were reviewed by me and considered in my medical decision making (see chart for details).  Clinical Course    PCn causes gi upset/   Pt given rx for augmentin.   Final Clinical Impressions(s) / ED Diagnoses   Final diagnoses:  Abrasions of multiple sites    New Prescriptions Discharge Medication List as of 05/18/2016  3:11 PM    START  taking these medications   Details  amoxicillin-clavulanate (AUGMENTIN) 875-125 MG tablet Take 1 tablet by mouth every 12 (twelve) hours., Starting Sun 05/18/2016, Print      An After Visit Summary was printed and given to the patient.   Lonia Skinner Kingman, PA-C 05/18/16 1614    Courteney Randall An, MD 05/19/16 0006

## 2016-05-18 NOTE — ED Triage Notes (Signed)
Patient with scratches to bilateral arms from his cat. Patient denies pain.

## 2016-06-01 ENCOUNTER — Emergency Department (HOSPITAL_BASED_OUTPATIENT_CLINIC_OR_DEPARTMENT_OTHER): Payer: Medicare Other

## 2016-06-01 ENCOUNTER — Emergency Department (HOSPITAL_BASED_OUTPATIENT_CLINIC_OR_DEPARTMENT_OTHER)
Admission: EM | Admit: 2016-06-01 | Discharge: 2016-06-02 | Disposition: A | Discharge status: Home / Self Care | Payer: Medicare Other | Attending: Emergency Medicine | Admitting: Emergency Medicine

## 2016-06-01 ENCOUNTER — Encounter (HOSPITAL_BASED_OUTPATIENT_CLINIC_OR_DEPARTMENT_OTHER): Payer: Self-pay | Admitting: Emergency Medicine

## 2016-06-01 DIAGNOSIS — J449 Chronic obstructive pulmonary disease, unspecified: Secondary | ICD-10-CM | POA: Insufficient documentation

## 2016-06-01 DIAGNOSIS — Y738 Miscellaneous gastroenterology and urology devices associated with adverse incidents, not elsewhere classified: Secondary | ICD-10-CM | POA: Diagnosis not present

## 2016-06-01 DIAGNOSIS — Z8673 Personal history of transient ischemic attack (TIA), and cerebral infarction without residual deficits: Secondary | ICD-10-CM | POA: Insufficient documentation

## 2016-06-01 DIAGNOSIS — R609 Edema, unspecified: Secondary | ICD-10-CM

## 2016-06-01 DIAGNOSIS — T83091A Other mechanical complication of indwelling urethral catheter, initial encounter: Secondary | ICD-10-CM

## 2016-06-01 DIAGNOSIS — F17210 Nicotine dependence, cigarettes, uncomplicated: Secondary | ICD-10-CM | POA: Insufficient documentation

## 2016-06-01 DIAGNOSIS — T83098A Other mechanical complication of other indwelling urethral catheter, initial encounter: Secondary | ICD-10-CM | POA: Diagnosis not present

## 2016-06-01 DIAGNOSIS — F909 Attention-deficit hyperactivity disorder, unspecified type: Secondary | ICD-10-CM | POA: Diagnosis not present

## 2016-06-01 DIAGNOSIS — Z466 Encounter for fitting and adjustment of urinary device: Secondary | ICD-10-CM

## 2016-06-01 DIAGNOSIS — R000 Tachycardia, unspecified: Secondary | ICD-10-CM | POA: Insufficient documentation

## 2016-06-01 DIAGNOSIS — R600 Localized edema: Secondary | ICD-10-CM

## 2016-06-01 DIAGNOSIS — M7989 Other specified soft tissue disorders: Secondary | ICD-10-CM | POA: Diagnosis present

## 2016-06-01 LAB — CBC WITH DIFFERENTIAL/PLATELET
BASOS ABS: 0.1 10*3/uL (ref 0.0–0.1)
BASOS PCT: 1 %
EOS ABS: 0.6 10*3/uL (ref 0.0–0.7)
Eosinophils Relative: 7 %
HCT: 41.7 % (ref 39.0–52.0)
HEMOGLOBIN: 13.9 g/dL (ref 13.0–17.0)
Lymphocytes Relative: 20 %
Lymphs Abs: 1.7 10*3/uL (ref 0.7–4.0)
MCH: 30.4 pg (ref 26.0–34.0)
MCHC: 33.3 g/dL (ref 30.0–36.0)
MCV: 91.2 fL (ref 78.0–100.0)
Monocytes Absolute: 0.6 10*3/uL (ref 0.1–1.0)
Monocytes Relative: 7 %
NEUTROS PCT: 65 %
Neutro Abs: 5.6 10*3/uL (ref 1.7–7.7)
Platelets: 252 10*3/uL (ref 150–400)
RBC: 4.57 MIL/uL (ref 4.22–5.81)
RDW: 13.6 % (ref 11.5–15.5)
WBC: 8.5 10*3/uL (ref 4.0–10.5)

## 2016-06-01 LAB — BASIC METABOLIC PANEL
Anion gap: 7 (ref 5–15)
BUN: 19 mg/dL (ref 6–20)
CALCIUM: 8.9 mg/dL (ref 8.9–10.3)
CHLORIDE: 108 mmol/L (ref 101–111)
CO2: 26 mmol/L (ref 22–32)
CREATININE: 1.43 mg/dL — AB (ref 0.61–1.24)
GFR, EST AFRICAN AMERICAN: 52 mL/min — AB (ref >60)
GFR, EST NON AFRICAN AMERICAN: 45 mL/min — AB (ref >60)
Glucose, Bld: 105 mg/dL — ABNORMAL HIGH (ref 65–99)
Potassium: 4 mmol/L (ref 3.5–5.1)
SODIUM: 141 mmol/L (ref 135–145)

## 2016-06-01 MED ORDER — FENTANYL CITRATE (PF) 100 MCG/2ML IJ SOLN
50.0000 ug | Freq: Once | INTRAMUSCULAR | Status: AC
  Administered 2016-06-02: 50 ug via INTRAVENOUS
  Filled 2016-06-01: qty 2

## 2016-06-01 NOTE — ED Provider Notes (Signed)
[facility]   CSN: [id number] Arrival date & time: 06/01/16  2201  By signing my name below, I, Phillis Haggis, attest that this documentation has been prepared under the direction and in the presence of Benjiman Core, MD. Electronically Signed: Phillis Haggis, ED Scribe. 06/01/16. 10:16 PM.  History   Chief Complaint Chief Complaint  Patient presents with  . Leg Swelling   The history is provided by the patient. No language interpreter was used.  HPI Comments: David Duke is a 81 y.o. Male with a hx of COPD and stroke who presents to the Emergency Department complaining of gradually worsening right leg swelling onset 3 days ago. Pt reports abdominal pain. He reports some SOB, but has chronic COPD. Pt uses an inhaler to relief. He denies hx of similar symptoms. He denies pain to the leg, diarrhea, constipation, or chest pain. Pt has a foley catheter in place that has been draining well. It was last drained 2 weeks ago.  Past Medical History:  Diagnosis Date  . Anxiety   . Blind right eye   . COPD (chronic obstructive pulmonary disease) (HCC)   . Depression   . High cholesterol   . Macular degeneration of both eyes   . Shortness of breath dyspnea   . Stroke [facility])     Patient Active Problem List   Diagnosis Date Noted  . History of stroke in prior 3 months 01/22/2016  . Chronic tension-type headache, not intractable   . Acute frontal sinusitis   . 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 Gait disturbance, post-stroke   . Ataxia, late effect of cerebrovascular disease   . BPH (benign prostatic hyperplasia)   . Urinary retention   . Adjustment disorder with mixed anxiety and depressed mood   . Chronic obstructive pulmonary disease (HCC)   . Cephalalgia   . AKI (acute kidney injury) (HCC)   . Macular degeneration   . Prediabetes   . Hypokalemia   . Absolute anemia   . Aphasia   . Acute encephalopathy 11/25/2015    . Hyponatremia 11/25/2015  . Acute kidney injury (HCC) 11/25/2015  . Depression   . Anxiety   . COPD (chronic obstructive pulmonary disease) (HCC)   . ICH (intracerebral hemorrhage) (HCC) 11/22/2015    Past Surgical History:  Procedure Laterality Date  . NO PAST SURGERIES         Home Medications    Prior to Admission medications   Medication Sig Start Date End Date Taking? Authorizing Provider  albuterol (PROVENTIL HFA;VENTOLIN HFA) 108 (90 Base) MCG/ACT inhaler Inhale 2 puffs into the lungs every 6 (six) hours as needed for wheezing or shortness of breath.    Historical Provider, MD  albuterol (PROVENTIL HFA;VENTOLIN HFA) 108 (90 Base) MCG/ACT inhaler Inhale 2 puffs into the lungs every 4 (four) hours as needed for wheezing or shortness of breath (or cough). 04/08/16   Trixie Dredge, PA-C  amoxicillin-clavulanate (AUGMENTIN) 875-125 MG tablet Take 1 tablet by mouth every 12 (twelve) hours. 05/18/16   Elson Areas, PA-C  atorvastatin (LIPITOR) 20 MG tablet Take 1 tablet (20 mg total) by mouth daily at 6 PM. 12/11/15   [name] Love, PA-C  diazepam (VALIUM) 5 MG tablet Take 0.5 tablets (2.5 mg total) by mouth every 8 (eight) hours as needed for anxiety. 12/11/15   [name] Love, PA-C  FINASTERIDE PO Take 5 mg by mouth daily.     Historical Provider, MD  fluticasone (FLONASE) 50 MCG/ACT nasal spray Place 1 spray into both nostrils daily. 12/11/15   Jacquelynn CreePamela S Love, PA-C  predniSONE (DELTASONE) 20 MG tablet Take 2 tablets (40 mg total) by mouth daily with breakfast. 04/08/16   Trixie DredgeEmily West, PA-C  senna (SENOKOT) 8.6 MG tablet Take 2 tablets by mouth at bedtime as needed for constipation.    Historical Provider, MD  silodosin (RAPAFLO) 8 MG CAPS capsule Take 8 mg by mouth daily with breakfast.    Historical Provider, MD    Family History Family History  Problem Relation Age of Onset  . Colon cancer Mother   . Cerebral aneurysm Father     Social History Social History  Substance Use Topics  .  Smoking status: Current Every Day Smoker    Packs/day: 0.50    Years: 60.00    Types: Cigarettes  . Smokeless tobacco: Never Used  . Alcohol use No     Allergies   Penicillins   Review of Systems Review of Systems  Cardiovascular: Positive for leg swelling. Negative for chest pain.  Gastrointestinal: Positive for abdominal pain. Negative for constipation and diarrhea.  Musculoskeletal: Negative for arthralgias.  All other systems reviewed and are negative.  Physical Exam Updated Vital Signs BP (!) 178/106   Pulse 111   Temp 98.3 F (36.8 C)   Resp 20   Ht 5\' 8"  (1.727 m)   Wt 140 lb (63.5 kg)   SpO2 93%   BMI 21.29 kg/m   Physical Exam  Constitutional: He is oriented to person, place, and time. He appears well-developed and well-nourished.  HENT:  Head: Normocephalic and atraumatic.  Eyes: EOM are normal. Pupils are equal, round, and reactive to light.  Neck: Normal range of motion. Neck supple. No JVD present.  Cardiovascular: Regular rhythm and normal heart sounds.  Tachycardia present.  Exam reveals no gallop and no friction rub.   No murmur heard. Pulmonary/Chest: Effort normal. Tachypnea noted. He has wheezes.  Mild wheezing  Abdominal: Soft. He exhibits no mass. There is tenderness in the suprapubic area. There is no rebound and no guarding.  foley catheter in place with leg bag  Musculoskeletal: Normal range of motion. He exhibits edema.  Pitting edema to the right lower leg. strong DP pulse, no erythema  Neurological: He is alert and oriented to person, place, and time.  Skin: Skin is warm and dry.  Psychiatric: He has a normal mood and affect. His behavior is normal.  Nursing note and vitals reviewed.  ED Treatments / Results  DIAGNOSTIC STUDIES: Oxygen Saturation is 93% on RA, low by my interpretation.    COORDINATION OF CARE: 10:15 PM-Discussed treatment plan which includes US and labs with pt at bedside and pt agreed to plan.    Labs (all labs  ordered are listed, but only abnormal results are displayed) Labs Reviewed  BASIC METABOLIC PANEL - Abnormal; Notable for the following:       Result Value   Glucose, Bld 105 (*)    Creatinine, Ser 1.43 (*)    GFR calc non Af Amer 45 (*)    GFR calc Af Amer 52 (*)    All other components within normal limits  CBC WITH DIFFERENTIAL/PLATELET  URINALYSIS, ROUTINE W REFLEX MICROSCOPIC (NOT AT [facility])    EKG  EKG Interpretation None       Radiology Dg Chest 2 View  Result Date: 06/01/2016 CLINICAL DATA:  Acute onset of right leg swelling. Initial encounter. EXAM: CHEST  2  VIEW COMPARISON:  Chest radiograph performed 04/08/2016 FINDINGS: The lungs are well-aerated and clear. There is no evidence of focal opacification, pleural effusion or pneumothorax. The heart is normal in size; the mediastinal contour is within normal limits. No acute osseous abnormalities are seen. IMPRESSION: No acute cardiopulmonary process seen. Electronically Signed   By: Roanna Raider M.D.   On: 06/01/2016 22:54   US Venous Img Lower Unilateral Right  Result Date: 06/01/2016 CLINICAL DATA:  Acute onset of right calf swelling and erythema. Initial encounter. EXAM: RIGHT LOWER EXTREMITY VENOUS DOPPLER ULTRASOUND TECHNIQUE: Gray-scale sonography with graded compression, as well as color Doppler and duplex ultrasound were performed to evaluate the lower extremity deep venous systems from the level of the common femoral vein and including the common femoral, femoral, profunda femoral, popliteal and calf veins including the posterior tibial, peroneal and gastrocnemius veins when visible. The superficial great saphenous vein was also interrogated. Spectral Doppler was utilized to evaluate flow at rest and with distal augmentation maneuvers in the common femoral, femoral and popliteal veins. COMPARISON:  Right lower extremity venous Doppler ultrasound performed 04/28/2016 FINDINGS: Contralateral Common Femoral Vein: Respiratory  phasicity is normal and symmetric with the symptomatic side. No evidence of thrombus. Normal compressibility. Common Femoral Vein: No evidence of thrombus. Normal compressibility, respiratory phasicity and response to augmentation. Saphenofemoral Junction: No evidence of thrombus. Normal compressibility and flow on color Doppler imaging. Profunda Femoral Vein: No evidence of thrombus. Normal compressibility and flow on color Doppler imaging. Femoral Vein: No evidence of thrombus. Normal compressibility, respiratory phasicity and response to augmentation. Popliteal Vein: No evidence of thrombus. Normal compressibility, respiratory phasicity and response to augmentation. Calf Veins: No evidence of thrombus. Normal compressibility and flow on color Doppler imaging. Superficial Great Saphenous Vein: No evidence of thrombus. Normal compressibility and flow on color Doppler imaging. Venous Reflux:  None. Other Findings: Soft tissue edema is noted at the right lower calf and ankle. IMPRESSION: 1. No evidence of deep venous thrombosis. 2. Soft tissue edema noted at the right lower calf and ankle. Electronically Signed   By: Roanna Raider M.D.   On: 06/01/2016 22:42    Procedures Procedures (including critical care time)  Medications Ordered in ED Medications  fentaNYL (SUBLIMAZE) injection 50 mcg (not administered)     Initial Impression / Assessment and Plan / ED Course  I have reviewed the triage vital signs and the nursing notes.  Pertinent labs & imaging results that were available during my care of the patient were reviewed by me and considered in my medical decision making (see chart for details).  Clinical Course    Patient with leg swelling. History of same and has negative Doppler just as he did a few weeks ago. Kidney function is at baseline. Also some urinary symptoms but found to have obstructed Foley catheter. To be replaced. Kidney function at baseline. Urine pending but likely discharge  home.  Final Clinical Impressions(s) / ED Diagnoses   Final diagnoses:  Obstructed Foley catheter, initial encounter (HCC)  Peripheral edema  I personally performed the services described in this documentation, which was scribed in my presence. The recorded information has been reviewed and is accurate.     New Prescriptions New Prescriptions   No medications on file     Benjiman Core, MD 06/01/16 2318

## 2016-06-01 NOTE — ED Notes (Signed)
Patient transported to X-ray 

## 2016-06-01 NOTE — ED Triage Notes (Signed)
Pt in c/o R leg swelling x 4 days. Pt alert, interactive, ambulatory in NAD.

## 2016-06-01 NOTE — Discharge Instructions (Addendum)
It was our pleasure to provide your ER care today - we hope that you feel better.  The lab work shows a possible urine infection - take antibiotic as prescribed.   Follow with your doctor/urologist as planned.  Return to ER if worse, new symptoms, fevers, persistent vomiting, severe abdominal pain, catheter not working, other concern.

## 2016-06-02 LAB — URINALYSIS, ROUTINE W REFLEX MICROSCOPIC
BILIRUBIN URINE: NEGATIVE
Glucose, UA: NEGATIVE mg/dL
Ketones, ur: NEGATIVE mg/dL
NITRITE: POSITIVE — AB
PH: 6.5 (ref 5.0–8.0)
Protein, ur: NEGATIVE mg/dL
SPECIFIC GRAVITY, URINE: 1.011 (ref 1.005–1.030)

## 2016-06-02 LAB — URINE MICROSCOPIC-ADD ON

## 2016-06-02 MED ORDER — CEPHALEXIN 250 MG PO CAPS
500.0000 mg | ORAL_CAPSULE | Freq: Once | ORAL | Status: AC
  Administered 2016-06-02: 500 mg via ORAL
  Filled 2016-06-02: qty 2

## 2016-06-02 MED ORDER — ACETAMINOPHEN 325 MG PO TABS
650.0000 mg | ORAL_TABLET | Freq: Once | ORAL | Status: AC
Start: 2016-06-02 — End: 2016-06-02
  Administered 2016-06-02: 650 mg via ORAL
  Filled 2016-06-02: qty 2

## 2016-06-02 MED ORDER — CEPHALEXIN 500 MG PO CAPS: 500.0000 mg | ORAL_CAPSULE | Freq: Three times a day (TID) | ORAL | 0 refills | Status: DC

## 2016-06-02 NOTE — ED Provider Notes (Signed)
Signed out by Dr Rubin PayorPickering to d/c to home after new foley placed.  New foley placed, draining into bag. No abd pain. Afeb.  Possible uti on labs w many bact and 6-30 wbc - will rx.   Tylenol po. Keflex po.   Patient current appears stable for d/c.      Cathren LaineKevin Kati Riggenbach, MD 06/02/16 0130

## 2016-06-04 ENCOUNTER — Encounter (HOSPITAL_BASED_OUTPATIENT_CLINIC_OR_DEPARTMENT_OTHER): Payer: Self-pay | Admitting: Emergency Medicine

## 2016-06-04 ENCOUNTER — Emergency Department (HOSPITAL_BASED_OUTPATIENT_CLINIC_OR_DEPARTMENT_OTHER)
Admission: EM | Admit: 2016-06-04 | Discharge: 2016-06-04 | Disposition: A | Discharge status: Home / Self Care | Payer: Medicare Other | Attending: Emergency Medicine | Admitting: Emergency Medicine

## 2016-06-04 DIAGNOSIS — T83018A Breakdown (mechanical) of other indwelling urethral catheter, initial encounter: Secondary | ICD-10-CM

## 2016-06-04 DIAGNOSIS — J449 Chronic obstructive pulmonary disease, unspecified: Secondary | ICD-10-CM | POA: Diagnosis not present

## 2016-06-04 DIAGNOSIS — F17210 Nicotine dependence, cigarettes, uncomplicated: Secondary | ICD-10-CM | POA: Insufficient documentation

## 2016-06-04 DIAGNOSIS — T83038A Leakage of other indwelling urethral catheter, initial encounter: Secondary | ICD-10-CM | POA: Insufficient documentation

## 2016-06-04 DIAGNOSIS — Y829 Unspecified medical devices associated with adverse incidents: Secondary | ICD-10-CM | POA: Diagnosis not present

## 2016-06-04 HISTORY — DX: Disorder of kidney and ureter, unspecified: N28.9

## 2016-06-04 HISTORY — DX: Other retention of urine: R33.8

## 2016-06-04 HISTORY — DX: Benign prostatic hyperplasia with lower urinary tract symptoms: N40.1

## 2016-06-04 HISTORY — DX: Acute kidney failure, unspecified: N17.9

## 2016-06-04 NOTE — ED Notes (Signed)
MD at bedside. 

## 2016-06-04 NOTE — ED Notes (Signed)
Change pt's cath bag to leg bag due to leaking cath bag

## 2016-06-04 NOTE — ED Triage Notes (Signed)
Pt recently had urinary catheter changed out Sunday 9/24. Bag began to [employer]. Wife changed bag to one she had already at home which is also having leaking around the exit site of the bag. Pt denies changes or discomfort around penis.

## 2016-06-04 NOTE — ED Notes (Signed)
Pt request to change bag to leg bag versus standard drainage bag.

## 2016-06-04 NOTE — ED Provider Notes (Signed)
[facility] Provider Note   CSN: [id number] Arrival date & time: 06/04/16  0900     History   Chief Complaint Chief Complaint  Patient presents with  . Urinary Bag Leaking    HPI David Duke is a 81 y.o. male.  HPI  Patient presents today with Foley bag dysfunction. Patient reports that he was seen here several days ago and had his Foley replaced which is been indwelling since stroke several months ago. They noted the bag leaking yesterday and replaced it with an extra bag that they have home however this bag is also leaking as well.  In addition the patient reports bilateral lower extremity swelling w/o pain, right greater than left, that was evaluated on 9/24; and noted to have a negative ultrasound DVT study.  No other complaints at this time.  Past Medical History:  Diagnosis Date  . AKI (acute kidney injury) (HCC)   . Anxiety   . Blind right eye   . BPH (benign prostatic hypertrophy) with urinary retention   . COPD (chronic obstructive pulmonary disease) (HCC)   . Depression   . High cholesterol   . Macular degeneration of both eyes   . Renal disorder   . Shortness of breath dyspnea   . Stroke [facility](HCC)     Patient Active Problem List   Diagnosis Date Noted  . History of stroke in prior 3 months 01/22/2016  . Chronic tension-type headache, not intractable   . Acute frontal sinusitis   . 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 Gait disturbance, post-stroke   . Ataxia, late effect of cerebrovascular disease   . BPH (benign prostatic hyperplasia)   . Urinary retention   . Adjustment disorder with mixed anxiety and depressed mood   . Chronic obstructive pulmonary disease (HCC)   . Cephalalgia   . AKI (acute kidney injury) (HCC)   . Macular degeneration   . Prediabetes   . Hypokalemia   . Absolute anemia   . Aphasia   . Acute encephalopathy 11/25/2015  . Hyponatremia 11/25/2015  . Acute kidney injury (HCC)  11/25/2015  . Depression   . Anxiety   . COPD (chronic obstructive pulmonary disease) (HCC)   . ICH (intracerebral hemorrhage) (HCC) 11/22/2015    Past Surgical History:  Procedure Laterality Date  . NO PAST SURGERIES         Home Medications    Prior to Admission medications   Medication Sig Start Date End Date Taking? Authorizing Provider  albuterol (PROVENTIL HFA;VENTOLIN HFA) 108 (90 Base) MCG/ACT inhaler Inhale 2 puffs into the lungs every 6 (six) hours as needed for wheezing or shortness of breath.    Historical Provider, MD  albuterol (PROVENTIL HFA;VENTOLIN HFA) 108 (90 Base) MCG/ACT inhaler Inhale 2 puffs into the lungs every 4 (four) hours as needed for wheezing or shortness of breath (or cough). 04/08/16   Trixie DredgeEmily West, PA-C  amoxicillin-clavulanate (AUGMENTIN) 875-125 MG tablet Take 1 tablet by mouth every 12 (twelve) hours. 05/18/16   Elson AreasLeslie K Sofia, PA-C  atorvastatin (LIPITOR) 20 MG tablet Take 1 tablet (20 mg total) by mouth daily at 6 PM. 12/11/15   Evlyn KannerPamela S Love, PA-C  cephALEXin (KEFLEX) 500 MG capsule Take 1 capsule (500 mg total) by mouth 3 (three) times daily. 06/02/16   Cathren LaineKevin Steinl, MD  diazepam (VALIUM) 5 MG tablet Take 0.5 tablets (2.5 mg total) by mouth every 8 (eight) hours as needed for anxiety. 12/11/15   Rinaldo CloudPamela  S Love, PA-C  FINASTERIDE PO Take 5 mg by mouth daily.     Historical Provider, MD  fluticasone (FLONASE) 50 MCG/ACT nasal spray Place 1 spray into both nostrils daily. 12/11/15   Jacquelynn Cree, PA-C  predniSONE (DELTASONE) 20 MG tablet Take 2 tablets (40 mg total) by mouth daily with breakfast. 04/08/16   Trixie Dredge, PA-C  senna (SENOKOT) 8.6 MG tablet Take 2 tablets by mouth at bedtime as needed for constipation.    Historical Provider, MD  silodosin (RAPAFLO) 8 MG CAPS capsule Take 8 mg by mouth daily with breakfast.    Historical Provider, MD    Family History Family History  Problem Relation Age of Onset  . Colon cancer Mother   . Cerebral aneurysm  Father     Social History Social History  Substance Use Topics  . Smoking status: Current Every Day Smoker    Packs/day: 0.50    Years: 60.00    Types: Cigarettes  . Smokeless tobacco: Never Used  . Alcohol use No     Allergies   Penicillins   Review of Systems Review of Systems  Constitutional: Negative for appetite change, fatigue and fever.  Respiratory: Negative for shortness of breath.   Cardiovascular: Positive for leg swelling. Negative for chest pain.  Gastrointestinal: Negative for abdominal distention, abdominal pain and nausea.  Genitourinary: Positive for difficulty urinating. Negative for penile pain and penile swelling.     Physical Exam Updated Vital Signs BP 143/89 (BP Location: Right Arm)   Pulse 101   Temp 98.1 F (36.7 C) (Oral)   Resp 18   Ht 5\' 8"  (1.727 m)   Wt 140 lb (63.5 kg)   SpO2 96%   BMI 21.29 kg/m   Physical Exam  Constitutional: He is oriented to person, place, and time. He appears well-developed and well-nourished. No distress.  HENT:  Head: Normocephalic and atraumatic.  Right Ear: External ear normal.  Left Ear: External ear normal.  Nose: Nose normal.  Mouth/Throat: Mucous membranes are normal. No trismus in the jaw.  Eyes: Conjunctivae and EOM are normal. No scleral icterus.  Neck: Normal range of motion and phonation normal.  Cardiovascular: Normal rate and regular rhythm.   Pulmonary/Chest: Effort normal. No stridor. No respiratory distress.  Abdominal: He exhibits no distension.  Musculoskeletal: Normal range of motion. He exhibits no edema.  BLE edema. R>L. Nontender. No erythema.  Neurological: He is alert and oriented to person, place, and time.  Skin: He is not diaphoretic.  Psychiatric: He has a normal mood and affect. His behavior is normal.  Vitals reviewed.    ED Treatments / Results  Labs (all labs ordered are listed, but only abnormal results are displayed) Labs Reviewed - No data to display  EKG   EKG Interpretation None       Radiology No results found.  Procedures Procedures (including critical care time)  Medications Ordered in ED Medications - No data to display   Initial Impression / Assessment and Plan / ED Course  I have reviewed the triage vital signs and the nursing notes.  Pertinent labs & imaging results that were available during my care of the patient were reviewed by me and considered in my medical decision making (see chart for details).  Clinical Course    Foley bag replaced to leg bag. Did not required change of the Foley catheter.  The patient is safe for discharge with strict return precautions.   Final Clinical Impressions(s) / ED Diagnoses  Final diagnoses:  Urinary catheter dysfunction, initial encounter [facility])   Disposition: Discharge  Condition: Good  I have discussed the results, Dx and Tx plan with the patient and wife who expressed understanding and agree(s) with the plan. Discharge instructions discussed at great length. The patient and wife was given strict return precautions who verbalized understanding of the instructions. No further questions at time of discharge.    Current Discharge Medication List      Follow Up: Jearld Lesch, MD [address] [place] [address] [phone]  Call  As needed  [facility] [address] [place] [address] [phone] Call  For help establishing care with a care provider      Nira Conn, MD 06/04/16 1701

## 2016-06-09 ENCOUNTER — Emergency Department (HOSPITAL_COMMUNITY)
Admission: EM | Admit: 2016-06-09 | Discharge: 2016-06-10 | Disposition: A | Discharge status: Home / Self Care | Payer: Medicare Other | Attending: Emergency Medicine | Admitting: Emergency Medicine

## 2016-06-09 ENCOUNTER — Emergency Department (HOSPITAL_COMMUNITY): Payer: Medicare Other

## 2016-06-09 ENCOUNTER — Encounter (HOSPITAL_COMMUNITY): Payer: Self-pay | Admitting: Emergency Medicine

## 2016-06-09 DIAGNOSIS — F17210 Nicotine dependence, cigarettes, uncomplicated: Secondary | ICD-10-CM | POA: Insufficient documentation

## 2016-06-09 DIAGNOSIS — F39 Unspecified mood [affective] disorder: Secondary | ICD-10-CM | POA: Diagnosis not present

## 2016-06-09 DIAGNOSIS — Z8673 Personal history of transient ischemic attack (TIA), and cerebral infarction without residual deficits: Secondary | ICD-10-CM | POA: Insufficient documentation

## 2016-06-09 DIAGNOSIS — F0391 Unspecified dementia with behavioral disturbance: Secondary | ICD-10-CM

## 2016-06-09 DIAGNOSIS — F419 Anxiety disorder, unspecified: Secondary | ICD-10-CM | POA: Diagnosis not present

## 2016-06-09 DIAGNOSIS — J441 Chronic obstructive pulmonary disease with (acute) exacerbation: Secondary | ICD-10-CM

## 2016-06-09 DIAGNOSIS — R0602 Shortness of breath: Secondary | ICD-10-CM | POA: Diagnosis present

## 2016-06-09 MED ORDER — ALBUTEROL SULFATE (2.5 MG/3ML) 0.083% IN NEBU
5.0000 mg | INHALATION_SOLUTION | Freq: Once | RESPIRATORY_TRACT | Status: AC
  Administered 2016-06-09: 5 mg via RESPIRATORY_TRACT
  Filled 2016-06-09: qty 6

## 2016-06-09 NOTE — ED Triage Notes (Signed)
Per EMS, pt reports increased SOB X4 days, becoming increasing "panic" today.  Pt Hx of COPD and stoke.  Pt given 15mg  Albuteral, 1mg  atrovent and 125 sol-med.  Initially diminished lung sounds on the left w/ wheezing on the right, after treatment, wheezing on both lobes.  Denies CP.
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
## 2016-06-10 LAB — CBC WITH DIFFERENTIAL/PLATELET
BASOS ABS: 0 10*3/uL (ref 0.0–0.1)
Basophils Relative: 0 %
EOS PCT: 1 %
Eosinophils Absolute: 0.1 10*3/uL (ref 0.0–0.7)
HCT: 43.8 % (ref 39.0–52.0)
HEMOGLOBIN: 14.3 g/dL (ref 13.0–17.0)
Lymphocytes Relative: 7 %
Lymphs Abs: 1 10*3/uL (ref 0.7–4.0)
MCH: 29.9 pg (ref 26.0–34.0)
MCHC: 32.6 g/dL (ref 30.0–36.0)
MCV: 91.6 fL (ref 78.0–100.0)
Monocytes Absolute: 0.3 10*3/uL (ref 0.1–1.0)
Monocytes Relative: 2 %
NEUTROS ABS: 12.9 10*3/uL — AB (ref 1.7–7.7)
Neutrophils Relative %: 90 %
PLATELETS: 234 10*3/uL (ref 150–400)
RBC: 4.78 MIL/uL (ref 4.22–5.81)
RDW: 13.5 % (ref 11.5–15.5)
WBC: 14.3 10*3/uL — ABNORMAL HIGH (ref 4.0–10.5)

## 2016-06-10 LAB — BASIC METABOLIC PANEL
ANION GAP: 10 (ref 5–15)
BUN: 13 mg/dL (ref 6–20)
CALCIUM: 9.1 mg/dL (ref 8.9–10.3)
CO2: 24 mmol/L (ref 22–32)
CREATININE: 1.41 mg/dL — AB (ref 0.61–1.24)
Chloride: 106 mmol/L (ref 101–111)
GFR, EST AFRICAN AMERICAN: 53 mL/min — AB (ref >60)
GFR, EST NON AFRICAN AMERICAN: 46 mL/min — AB (ref >60)
GLUCOSE: 145 mg/dL — AB (ref 65–99)
Potassium: 3.8 mmol/L (ref 3.5–5.1)
Sodium: 140 mmol/L (ref 135–145)

## 2016-06-10 LAB — URINE MICROSCOPIC-ADD ON

## 2016-06-10 LAB — URINALYSIS, ROUTINE W REFLEX MICROSCOPIC
BILIRUBIN URINE: NEGATIVE
Glucose, UA: NEGATIVE mg/dL
Ketones, ur: NEGATIVE mg/dL
NITRITE: NEGATIVE
PROTEIN: NEGATIVE mg/dL
Specific Gravity, Urine: 1.023 (ref 1.005–1.030)
pH: 6 (ref 5.0–8.0)

## 2016-06-10 LAB — I-STAT TROPONIN, ED: Troponin i, poc: 0 ng/mL (ref 0.00–0.08)

## 2016-06-10 LAB — BRAIN NATRIURETIC PEPTIDE: B NATRIURETIC PEPTIDE 5: 15.5 pg/mL (ref 0.0–100.0)

## 2016-06-10 MED ORDER — DOXYCYCLINE HYCLATE 100 MG PO TABS: 100.0000 mg | ORAL_TABLET | Freq: Every day | ORAL | Status: DC

## 2016-06-10 MED ORDER — ACETAMINOPHEN 325 MG PO TABS
650.0000 mg | ORAL_TABLET | ORAL | Status: DC | PRN
  Administered 2016-06-10: 650 mg via ORAL
  Filled 2016-06-10: qty 2

## 2016-06-10 MED ORDER — LORAZEPAM 1 MG PO TABS: 1.0000 mg | ORAL_TABLET | Freq: Three times a day (TID) | ORAL | Status: DC | PRN

## 2016-06-10 MED ORDER — DOXYCYCLINE HYCLATE 100 MG PO TABS: 100.0000 mg | ORAL_TABLET | Freq: Two times a day (BID) | ORAL | Status: DC

## 2016-06-10 MED ORDER — FLUCONAZOLE 100 MG PO TABS
150.0000 mg | ORAL_TABLET | Freq: Once | ORAL | Status: AC
  Administered 2016-06-10: 150 mg via ORAL
  Filled 2016-06-10: qty 2

## 2016-06-10 MED ORDER — PREDNISONE 20 MG PO TABS
60.0000 mg | ORAL_TABLET | Freq: Once | ORAL | Status: AC
  Administered 2016-06-10: 60 mg via ORAL
  Filled 2016-06-10: qty 3

## 2016-06-10 MED ORDER — DIAZEPAM 5 MG PO TABS: 5.0000 mg | ORAL_TABLET | Freq: Three times a day (TID) | ORAL | 0 refills | Status: DC | PRN

## 2016-06-10 MED ORDER — DOXYCYCLINE HYCLATE 100 MG PO TABS: 100.0000 mg | ORAL_TABLET | Freq: Once | ORAL | Status: DC

## 2016-06-10 MED ORDER — ALBUTEROL SULFATE (2.5 MG/3ML) 0.083% IN NEBU
5.0000 mg | INHALATION_SOLUTION | RESPIRATORY_TRACT | Status: DC | PRN
  Administered 2016-06-10: 5 mg via RESPIRATORY_TRACT
  Filled 2016-06-10: qty 6

## 2016-06-10 MED ORDER — PREDNISONE 20 MG PO TABS: 60.0000 mg | ORAL_TABLET | Freq: Every day | ORAL | Status: DC

## 2016-06-10 MED ORDER — ONDANSETRON HCL 4 MG PO TABS: 4.0000 mg | ORAL_TABLET | Freq: Three times a day (TID) | ORAL | Status: DC | PRN

## 2016-06-10 MED ORDER — DOXYCYCLINE HYCLATE 100 MG PO TABS
100.0000 mg | ORAL_TABLET | Freq: Once | ORAL | Status: AC
  Administered 2016-06-10: 100 mg via ORAL
  Filled 2016-06-10: qty 1

## 2016-06-10 MED ORDER — DOXYCYCLINE HYCLATE 100 MG PO CAPS: 100.0000 mg | ORAL_CAPSULE | Freq: Two times a day (BID) | ORAL | 0 refills | Status: DC

## 2016-06-10 MED ORDER — PREDNISONE 10 MG PO TABS: 60.0000 mg | ORAL_TABLET | Freq: Every day | ORAL | 0 refills | Status: DC

## 2016-06-10 MED ORDER — NICOTINE 21 MG/24HR TD PT24: 21.0000 mg | MEDICATED_PATCH | Freq: Every day | TRANSDERMAL | Status: DC

## 2016-06-10 NOTE — Progress Notes (Signed)
Requested that patient be seen by tele-psychiatry today by counselor due to worsening agitation in the evening that wife had reported. During assessment wife was present in the room who reported that "We are ready to discharge. Just been waiting. He has prescriptions for his medications, valium, prednisone and an antibiotic. He has been taking valium for years. David Duke came to the hospital for a COPD flare. David Duke sees his PCP Dr. Mayford KnifeWilliams for his medications and has a Insurance account managereurologist. We do not feel that psychiatry can be helpful to us. We are just ready to go. We need to get home and feed our animals." The patient denied any active suicidal or homicidal ideation. There was no evidence of any acute psychiatric symptoms such as psychosis from the assessment. Patient's wife declined the need for any medication recommendations at this time. Notified staff at extension [phone] of above. At this time psychiatry will be signing off.

## 2016-06-10 NOTE — Care Management Note (Signed)
Case Management Note  Patient Details  Name: David BarlowDavid R Duke MRN: [id number] Date of Birth: 03/24/1935  Subjective/Objective:                  81 y.o. male who presents to the Emergency Department via EMS with a relative complaining of gradually-worsening wheezing and SOB with onset 4 days PTA. From home with spouse.  Action/Plan: Follow for disposition needs. /Home with home health   Expected Discharge Date:  06/10/16               Expected Discharge Plan:  Home w Home Health Services  In-House Referral:  NA  Discharge planning Services  CM Consult  Post Acute Care Choice:  Home Health Choice offered to:  Spouse, Patient  DME Arranged:  N/A DME Agency:  NA  HH Arranged:  RN HH Agency:  [facility]  Status of Service:  Completed, signed off  If discussed at Long Length of Stay Meetings, dates discussed:    Additional Comments: Mima Cranmore J. Lucretia RoersWood, RN, BSN, [employer] [phone] Spoke with pt at bedside regarding [facility]. Offered pt list of home health agencies to choose from.  Pt chose [facility] to render services. Avie EchevariaKaren Nussbaum, RN of [facility] notified.  No DME needs identified at this time.  Oletta CohnWood, Juli Odom, RN 06/10/2016, 12:11 PM

## 2016-06-10 NOTE — ED Notes (Signed)
Family at bedside. 

## 2016-06-10 NOTE — ED Notes (Signed)
Case management made aware no further needs at this time, ready for discharge.  Dr. Clarene DukeLittle printed discharge papers.

## 2016-06-10 NOTE — ED Notes (Signed)
Fransisca [facility]auf[facility]Laura  [facility] to report as soon as she got on for TTS, pt and wife advised they did not need help from psych at this time for medication adjustment.  Pt has valium at home and will continue.  Pts wife advised [place] but [place].

## 2016-06-10 NOTE — ED Notes (Signed)
kling utilized to secure IV site

## 2016-06-10 NOTE — ED Notes (Signed)
Pt requesting to leave, states he wants to go home now, took himself off monitor. Pt encouraged to wait a little longer

## 2016-06-10 NOTE — BH Assessment (Signed)
NP- Fransisca KaufmannLaura Davis will assess the patient.  Writer informed the nurse.

## 2016-06-10 NOTE — ED Notes (Signed)
Ordered breakfast tray 0620

## 2016-06-10 NOTE — Discharge Planning (Signed)
PT PCP Dr Lerry Linerwight Williams.  Dr Mayford KnifeWilliams is currently looking for new office space- not retiring or leaving area.

## 2016-07-24 ENCOUNTER — Ambulatory Visit: Payer: Medicare Other | Admitting: Medical

## 2016-07-24 ENCOUNTER — Telehealth: Payer: Self-pay | Admitting: Specialist

## 2016-07-24 NOTE — Telephone Encounter (Signed)
Pt called in at 9:32 to cancel appt. Pt says that he is having transportation issues and need to reschedule his appt. Pt rescheduled for next week.

## 2016-07-29 ENCOUNTER — Telehealth: Payer: Self-pay | Admitting: Behavioral Health

## 2016-07-29 NOTE — Telephone Encounter (Signed)
Unable to reach patient at time of Pre-Visit Call.  Left message for patient to return call when available.    

## 2016-07-30 ENCOUNTER — Telehealth: Payer: Self-pay | Admitting: Medical

## 2016-07-30 ENCOUNTER — Ambulatory Visit (INDEPENDENT_AMBULATORY_CARE_PROVIDER_SITE_OTHER): Payer: Medicare Other | Admitting: Medical

## 2016-07-30 ENCOUNTER — Encounter: Payer: Self-pay | Admitting: Medical

## 2016-07-30 VITALS — BP 110/70 | HR 108 | Temp 98.1°F | Ht 68.0 in | Wt 142.0 lb

## 2016-07-30 DIAGNOSIS — Z8673 Personal history of transient ischemic attack (TIA), and cerebral infarction without residual deficits: Secondary | ICD-10-CM | POA: Diagnosis not present

## 2016-07-30 DIAGNOSIS — N400 Enlarged prostate without lower urinary tract symptoms: Secondary | ICD-10-CM

## 2016-07-30 DIAGNOSIS — E785 Hyperlipidemia, unspecified: Secondary | ICD-10-CM | POA: Diagnosis not present

## 2016-07-30 DIAGNOSIS — J441 Chronic obstructive pulmonary disease with (acute) exacerbation: Secondary | ICD-10-CM

## 2016-07-30 DIAGNOSIS — F411 Generalized anxiety disorder: Secondary | ICD-10-CM

## 2016-07-30 DIAGNOSIS — R5383 Other fatigue: Secondary | ICD-10-CM

## 2016-07-30 LAB — CBC WITH DIFFERENTIAL/PLATELET
BASOS PCT: 0 %
Basophils Absolute: 0 cells/uL (ref 0–200)
EOS ABS: 244 {cells}/uL (ref 15–500)
Eosinophils Relative: 2 %
HEMATOCRIT: 45.7 % (ref 38.5–50.0)
Hemoglobin: 15.1 g/dL (ref 13.2–17.1)
LYMPHS PCT: 14 %
Lymphs Abs: 1708 cells/uL (ref 850–3900)
MCH: 30.6 pg (ref 27.0–33.0)
MCHC: 33 g/dL (ref 32.0–36.0)
MCV: 92.5 fL (ref 80.0–100.0)
MONOS PCT: 9 %
MPV: 10 fL (ref 7.5–12.5)
Monocytes Absolute: 1098 cells/uL — ABNORMAL HIGH (ref 200–950)
NEUTROS PCT: 75 %
Neutro Abs: 9150 cells/uL — ABNORMAL HIGH (ref 1500–7800)
PLATELETS: 241 10*3/uL (ref 140–400)
RBC: 4.94 MIL/uL (ref 4.20–5.80)
RDW: 13.9 % (ref 11.0–15.0)
WBC: 12.2 10*3/uL — AB (ref 3.8–10.8)

## 2016-07-30 NOTE — Patient Instructions (Addendum)
For your chronic medical problems I want you to continue all your current medications.   I want to get cbc, cmp, lipid, and psa today.  Continue your valium and use 2-3 times at most for anxiety. I will need to present your dose and frequency to Supervising MD. And if med approved will need to sign contract and give UDS.  Today we discussed use of sertraline tablet very low dose in addition to the valium.(you declined but want you to reconsider)  Follow up in 2 weeks or as needed

## 2016-07-30 NOTE — Progress Notes (Signed)
Subjective:    Patient ID: David BarlowDavid R Kettering, male    DOB: 01/23/1935, 81 y.o.   MRN: [id number]  HPI  I have reviewed pt PMH, PSH, FH, Social History and Surgical History  Pt has hx of copd- Pt is on albuterol. Recently stable per his report.  Hx of bph- pt is on proscar  Hx of anxiety- Pt has been on valium 5 mg 4 times a day. He was started on medication in [place] when had stroke. He was very agitated in the hospital. Pt states he was initially given qid with [place]. When he was discharged he was given valium to take 2-3 times a day. Then most recent MD increased his med to qid. [facility] [place] [facility]  Hx of hyperlipidemia- Pt is on lipitor for his cholesterol.  Flu vaccine- Pt declined flu vaccine.  Hx of stroke- In march 2017 had stoke. He had bleed at that time. Last month wife thinks he is doing well. Pt sees Dr. Pearlean BrownieSethi.   Hx of some dementia per wife-She states getting better per pt. Seemed to occur around stroke.   Review of Systems  Constitutional: Negative for chills, fatigue and fever.  HENT: Negative for congestion and ear pain.   Eyes: Negative for photophobia and pain.  Respiratory: Negative for cough, chest tightness, shortness of breath and wheezing.   Cardiovascular: Negative for palpitations.  Gastrointestinal: Negative for abdominal pain.  Genitourinary:       Enlarged prostate and pt is catheterized. Post stroke had difficulty urinating.  Musculoskeletal: Negative for back pain.  Skin: Negative for rash.  Neurological: Negative for dizziness, seizures, speech difficulty, weakness and numbness.  Hematological: Negative for adenopathy. Does not bruise/bleed easily.  Psychiatric/Behavioral: Negative for behavioral problems, confusion and suicidal ideas. The patient is nervous/anxious. The patient is not hyperactive.        Some dementia.  Controlled presently.     Past Medical History:  Diagnosis Date  . AKI (acute kidney injury) (HCC)   . Anxiety   .  Blind right eye   . BPH (benign prostatic hypertrophy) with urinary retention   . COPD (chronic obstructive pulmonary disease) (HCC)   . Depression   . High cholesterol   . Macular degeneration of both eyes   . Renal disorder   . Shortness of breath dyspnea   . Stroke [facility](HCC)      Social History   Social History  . Marital status: Married    Spouse name: N/A  . Number of children: N/A  . Years of education: N/A   Occupational History  . Not on file.   Social History Main Topics  . Smoking status: Current Every Day Smoker    Packs/day: 0.50    Years: 60.00    Types: Cigarettes  . Smokeless tobacco: Never Used  . Alcohol use No  . Drug use: No  . Sexual activity: Not on file   Other Topics Concern  . Not on file   Social History Narrative  . No narrative on file    Past Surgical History:  Procedure Laterality Date  . NO PAST SURGERIES      Family History  Problem Relation Age of Onset  . Colon cancer Mother   . Cerebral aneurysm Father     Allergies  Allergen Reactions  . Penicillins Hives, Diarrhea and Nausea And Vomiting    Current Outpatient Prescriptions on File Prior to Visit  Medication Sig Dispense Refill  . albuterol (PROVENTIL HFA;VENTOLIN  HFA) 108 (90 Base) MCG/ACT inhaler Inhale 2 puffs into the lungs every 4 (four) hours as needed for wheezing or shortness of breath (or cough). 1 Inhaler 0  . atorvastatin (LIPITOR) 20 MG tablet Take 1 tablet (20 mg total) by mouth daily at 6 PM. 30 tablet 1  . diazepam (VALIUM) 5 MG tablet Take 1 tablet (5 mg total) by mouth every 8 (eight) hours as needed. Take 3 times daily, can take an extra tablet as needed for anxiety 15 tablet 0  . FINASTERIDE PO Take 5 mg by mouth daily.     . fluticasone (FLONASE) 50 MCG/ACT nasal spray Place 1 spray into both nostrils daily. (Patient taking differently: Place 1 spray into both nostrils daily as needed for allergies or rhinitis. )  2   No current facility-administered  medications on file prior to visit.     BP 110/70 (BP Location: Left Arm, Patient Position: Sitting, Cuff Size: Normal)   Pulse (!) 108   Temp 98.1 F (36.7 C) (Oral)   Ht 5\' 8"  (1.727 m)   Wt 142 lb (64.4 kg)   SpO2 98%   BMI 21.59 kg/m       Objective:   Physical Exam   General Mental Status- Alert. General Appearance- Not in acute distress.   Skin General: Color- Normal Color. Moisture- Normal Moisture.  Neck Carotid Arteries- Normal color. Moisture- Normal Moisture. No carotid bruits. No JVD.  Chest and Lung Exam Auscultation: Breath Sounds:-Normal. CTA.  Cardiovascular Auscultation:Rythm- Regular,rate and rythm Murmurs & Other Heart Sounds:Auscultation of the heart reveals- No Murmurs.  Abdomen Inspection:-Inspeection Normal. Palpation/Percussion:Note:No mass. Palpation and Percussion of the abdomen reveal- Non Tender, Non Distended + BS, no rebound or guarding.  Neurologic Cranial Nerve exam:- CN III-XII intact(No nystagmus), symmetric smile. Strength:- 5/5 equal and symmetric strength both upper and lower extremities. Finger to nose intact. No drift on exam.      Assessment & Plan:  For your chronic medical problems I want you to continue all your current medications.   I want to get cbc, cmp, lipid, and psa today.  Continue your valium and use 2-3 times at most for anxiety. I will need to present you dose and frequency to Supervising MD. And if med approved will need to sign contract and give UDS.  Today we discussed use of sertraline tablet very low dose in addition to the valium. (you declined but want you to reconsider)  Follow up in 2 weeks or as needed  [phone].

## 2016-07-30 NOTE — Progress Notes (Signed)
Pre visit review using our clinic review tool, if applicable. No additional management support is needed unless otherwise documented below in the visit note. 

## 2016-07-30 NOTE — Telephone Encounter (Signed)
Dr. Laury AxonLowne,  Pt seen today for first time. He uses valium 3-4 times daily for anxiety. Doing this since March of last year after stroke. I advised that we typically write twice a day and at most he could use 3 times a day. He has a lot of tabs from prior MD. He may run out in about 2 weeks. At that time would you approve #60 tabs bid prn anxiety or #90 tid prn anxiety. I am also tyring to get him to use low dose sertraline as well. Wanted you opinion before he comes in for follow up in 2 weeks.   Thanks, Ramon DredgeEdward

## 2016-07-31 LAB — LIPID PANEL
CHOL/HDL RATIO: 1.9 ratio (ref <5.0)
Cholesterol: 113 mg/dL (ref <200)
HDL: 58 mg/dL (ref >40)
LDL CALC: 33 mg/dL (ref <100)
Triglycerides: 110 mg/dL (ref <150)
VLDL: 22 mg/dL (ref <30)

## 2016-07-31 LAB — COMPREHENSIVE METABOLIC PANEL
ALK PHOS: 79 U/L (ref 40–115)
ALT: 20 U/L (ref 9–46)
AST: 21 U/L (ref 10–35)
Albumin: 4.1 g/dL (ref 3.6–5.1)
BUN: 15 mg/dL (ref 7–25)
CALCIUM: 9.1 mg/dL (ref 8.6–10.3)
CHLORIDE: 104 mmol/L (ref 98–110)
CO2: 26 mmol/L (ref 20–31)
Creat: 1.43 mg/dL — ABNORMAL HIGH (ref 0.70–1.18)
GLUCOSE: 93 mg/dL (ref 65–99)
POTASSIUM: 4.2 mmol/L (ref 3.5–5.3)
Sodium: 141 mmol/L (ref 135–146)
Total Bilirubin: 0.5 mg/dL (ref 0.2–1.2)
Total Protein: 6.8 g/dL (ref 6.1–8.1)

## 2016-07-31 LAB — PSA: PSA: 17.8 ng/mL — AB (ref <=4.0)

## 2016-08-01 ENCOUNTER — Telehealth: Payer: Self-pay | Admitting: Medical

## 2016-08-01 DIAGNOSIS — R5383 Other fatigue: Secondary | ICD-10-CM

## 2016-08-01 DIAGNOSIS — D72829 Elevated white blood cell count, unspecified: Secondary | ICD-10-CM

## 2016-08-01 DIAGNOSIS — R972 Elevated prostate specific antigen [PSA]: Secondary | ICD-10-CM

## 2016-08-01 NOTE — Telephone Encounter (Signed)
Referral to urologist made. 

## 2016-08-01 NOTE — Telephone Encounter (Signed)
Labs placed due to increased wbc and increased monocytes.

## 2016-08-01 NOTE — Telephone Encounter (Signed)
I would keep him on tid for now until zoloft is in his system--- then try to slowly dec valium I agree that we do not want him on tid long term

## 2016-08-05 ENCOUNTER — Telehealth: Payer: Self-pay | Admitting: Medical

## 2016-08-05 NOTE — Telephone Encounter (Signed)
Received medical records request form completed by pt except not sure which office to request records from. I have form on my desk. Please get name of former provider and phone # so we can fax request.

## 2016-08-07 ENCOUNTER — Telehealth: Payer: Self-pay | Admitting: Medical

## 2016-08-07 NOTE — Telephone Encounter (Signed)
-----   Message from Audelia Actonenise L Smith sent at 08/06/2016 11:15 AM EST ----- Regarding: Information Contact: [phone] Wife called and left the follow info for you.  Dr. Lerry Linerwight Williams phone number is [phone] Fax number: [phone] [address] [place], [name]

## 2016-08-07 NOTE — Telephone Encounter (Signed)
Faxed medical records request to Dr. Mayford KnifeWilliams.

## 2016-08-11 ENCOUNTER — Encounter: Payer: Self-pay | Admitting: Medical

## 2016-08-11 ENCOUNTER — Ambulatory Visit (INDEPENDENT_AMBULATORY_CARE_PROVIDER_SITE_OTHER): Payer: Medicare Other | Admitting: Medical

## 2016-08-11 ENCOUNTER — Telehealth: Payer: Self-pay | Admitting: Medical

## 2016-08-11 VITALS — BP 118/78 | HR 110 | Temp 98.3°F | Ht 68.0 in | Wt 142.0 lb

## 2016-08-11 DIAGNOSIS — F411 Generalized anxiety disorder: Secondary | ICD-10-CM

## 2016-08-11 DIAGNOSIS — Z5982 Transportation insecurity: Secondary | ICD-10-CM

## 2016-08-11 DIAGNOSIS — Z9189 Other specified personal risk factors, not elsewhere classified: Secondary | ICD-10-CM

## 2016-08-11 DIAGNOSIS — Z8673 Personal history of transient ischemic attack (TIA), and cerebral infarction without residual deficits: Secondary | ICD-10-CM | POA: Diagnosis not present

## 2016-08-11 MED ORDER — DIAZEPAM 5 MG PO TABS
ORAL_TABLET | ORAL | 0 refills | Status: DC
Start: 2016-08-11 — End: 2016-09-03

## 2016-08-11 MED ORDER — SERTRALINE HCL 25 MG PO TABS: 25.0000 mg | ORAL_TABLET | Freq: Every day | ORAL | 2 refills | Status: DC

## 2016-08-11 MED FILL — diazePAM 5 MG TABS: 5 | 30 days supply | Qty: 90 | Fill #0

## 2016-08-11 MED FILL — SERTRALINE HCL 25 MG TABLET: 25 | 30 days supply | Qty: 30 | Fill #0

## 2016-08-11 NOTE — Progress Notes (Signed)
Subjective:    Patient ID: David BarlowDavid R Duke, male    DOB: 01/23/1935, 81 y.o.   MRN: [id number]  HPI   Pt in for follow up.  Pt has been on valium in the past 4 times a day. I talked with Dr. Laury AxonLowne and she authorized him to use just tid. Pt in past had been on sertraline but high dose. He does not want to be on high dose again(in past was on 200 mg a day). But willing to be on low dose sertraline.  Pt wife has transportation issues. Wife hx of seizures and he can't drive.  Pt has history of stroke. Pt also has history of htn. Pt bp is controlled today.   Pt has follow up with neurologist in 6 months.   Review of Systems  Constitutional: Negative for chills, fatigue and fever.  Respiratory: Negative for cough, chest tightness, shortness of breath and wheezing.   Cardiovascular: Negative for chest pain and palpitations.  Gastrointestinal: Negative for abdominal pain.  Neurological: Negative for dizziness, syncope, speech difficulty, weakness, light-headedness and headaches.  Hematological: Negative for adenopathy. Does not bruise/bleed easily.  Psychiatric/Behavioral: Positive for dysphoric mood. Negative for behavioral problems, sleep disturbance and suicidal ideas. The patient is nervous/anxious.     Past Medical History:  Diagnosis Date  . AKI (acute kidney injury) (HCC)   . Anxiety   . Blind right eye   . BPH (benign prostatic hypertrophy) with urinary retention   . COPD (chronic obstructive pulmonary disease) (HCC)   . Depression   . High cholesterol   . Macular degeneration of both eyes   . Renal disorder   . Shortness of breath dyspnea   . Stroke [facility](HCC)      Social History   Social History  . Marital status: Married    Spouse name: N/A  . Number of children: N/A  . Years of education: N/A   Occupational History  . Not on file.   Social History Main Topics  . Smoking status: Current Every Day Smoker    Packs/day: 0.50    Years: 60.00    Types: Cigarettes   . Smokeless tobacco: Never Used  . Alcohol use No  . Drug use: No  . Sexual activity: Not on file   Other Topics Concern  . Not on file   Social History Narrative  . No narrative on file    Past Surgical History:  Procedure Laterality Date  . NO PAST SURGERIES      Family History  Problem Relation Age of Onset  . Colon cancer Mother   . Cerebral aneurysm Father     Allergies  Allergen Reactions  . Penicillins Hives, Diarrhea and Nausea And Vomiting    Current Outpatient Prescriptions on File Prior to Visit  Medication Sig Dispense Refill  . albuterol (PROVENTIL HFA;VENTOLIN HFA) 108 (90 Base) MCG/ACT inhaler Inhale 2 puffs into the lungs every 4 (four) hours as needed for wheezing or shortness of breath (or cough). 1 Inhaler 0  . atorvastatin (LIPITOR) 20 MG tablet Take 1 tablet (20 mg total) by mouth daily at 6 PM. 30 tablet 1  . diazepam (VALIUM) 5 MG tablet Take 1 tablet (5 mg total) by mouth every 8 (eight) hours as needed. Take 3 times daily, can take an extra tablet as needed for anxiety 15 tablet 0  . FINASTERIDE PO Take 5 mg by mouth daily.     . fluticasone (FLONASE) 50 MCG/ACT nasal spray Place 1 spray  into both nostrils daily. (Patient taking differently: Place 1 spray into both nostrils daily as needed for allergies or rhinitis. )  2   No current facility-administered medications on file prior to visit.     BP 118/78 (BP Location: Left Arm, Patient Position: Sitting, Cuff Size: Normal)   Pulse (!) 110   Temp 98.3 F (36.8 C) (Oral)   Ht 5\' 8"  (1.727 m)   Wt 142 lb (64.4 kg)   SpO2 98%   BMI 21.59 kg/m       Objective:   Physical Exam   General Mental Status- Alert. General Appearance- Not in acute distress. (he expresses anxiety and fear since he was hospitalized for stroke)  Skin General: Color- Normal Color. Moisture- Normal Moisture.  Neck Carotid Arteries- Normal color. Moisture- Normal Moisture. No carotid bruits. No JVD.  Chest and  Lung Exam Auscultation: Breath Sounds:-Normal.  Cardiovascular Auscultation:Rythm- Regular. Murmurs & Other Heart Sounds:Auscultation of the heart reveals- No Murmurs.  Abdomen Inspection:-Inspeection Normal. Palpation/Percussion:Note:No mass. Palpation and Percussion of the abdomen reveal- Non Tender, Non Distended + BS, no rebound or guarding.  Neurologic Cranial Nerve exam:- CN III-XII intact(No nystagmus), symmetric smile. Strength:- 5/5 equal and symmetric strength both upper and lower extremities.     Assessment & Plan:  For your anxiety post stroke will give you 90 tabs of the valium. I will also rx low dose sertraline of 25 mg q day. May increase to sertraline 50 mg one tab a day on follow up .  For your history of stroke follow up with Dr .Pearlean BrownieSethi.  I am going to ask Ashlee RN here to see if she can get you connected to organization that can help you with transportation.  Any stroke signs or symptoms then ED evaluation.  Follow up in one month or as needed.

## 2016-08-11 NOTE — Patient Instructions (Addendum)
For your anxiety post stroke will give you 90 tabs of the valium. I will also rx low dose sertraline of 25 mg q day. May increase to sertraline 50 mg one tab a day on follow up .  For your history of stroke follow up with Dr .Pearlean BrownieSethi.  I am going to ask Ashlee RN here to see if she can get you connected to organization that can help you with transportation.  Any stroke signs or symptoms then ED evaluation.  Follow up in one month or as needed.

## 2016-08-11 NOTE — Telephone Encounter (Signed)
Pt has problem with transportation. His wife has hx of seizures so she can't drive. Pt can't drive as well. Can thn arrange transportation for pt.

## 2016-08-11 NOTE — Telephone Encounter (Signed)
[facility]  [facility]  Referral ordered.

## 2016-08-11 NOTE — Progress Notes (Signed)
Pre visit review using our clinic review tool, if applicable. No additional management support is needed unless otherwise documented below in the visit note. 

## 2016-08-11 NOTE — Telephone Encounter (Signed)
Pt has hx of stroke, cognitive deficits and severe anxiety. He stopped driving after his stroke. Also history of copd.Transportation major problems as his wife had seizure and she can't drive as well.

## 2016-08-12 ENCOUNTER — Encounter: Payer: Self-pay | Admitting: Medical

## 2016-08-18 ENCOUNTER — Other Ambulatory Visit: Payer: Self-pay

## 2016-08-18 ENCOUNTER — Telehealth: Payer: Self-pay | Admitting: Medical

## 2016-08-18 MED ORDER — TRAZODONE HCL 50 MG PO TABS: 25.0000 mg | ORAL_TABLET | Freq: Every evening | ORAL | 1 refills | Status: DC | PRN
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
## 2016-08-18 NOTE — Telephone Encounter (Signed)
Let pt wife know that he has limited supply valium  to last him a whole month. So would use med sparingly.  Use as we prescribed so he does not run out sooner. I am going to rx trazadone. He can use this at night. May help him sleep. May relax him as well.  Let wife know that trazadone was sent in.   Try this at night if anxious rather than extra valium.

## 2016-08-19 ENCOUNTER — Encounter: Payer: Self-pay | Admitting: *Deleted

## 2016-08-19 MED FILL — traZODone HCL 50 MG TABS: 50 | 30 days supply | Qty: 30 | Fill #0

## 2016-08-19 NOTE — Telephone Encounter (Signed)
They should be aware Dr. Laury AxonLowne might not authorize refill of med at 4 times daily(in addition they will likely run out early as well). When he runs out will present a problem. So I would try the trazadone and use valium tid.

## 2016-08-19 NOTE — Telephone Encounter (Signed)
Pt notified and made aware.  States he has been taking 4 Valium tablets per day for the past 8 months and that has worked best for him.  States medication was prescribed to him that way by Dr. Chrissie NoaWilliam.  He says he would like for nurse to talk to his wife about the same.  Wife should call back within the next 5 mins.

## 2016-08-19 NOTE — Telephone Encounter (Signed)
I spoke with the patients wife and she voices understanding. She did not have any further questions.

## 2016-08-19 NOTE — Addendum Note (Signed)
Addended by: Alinda MoneyHUTCHINSON, Enzo Treu K on: 08/19/2016 01:48 PM   Modules accepted: Orders

## 2016-08-19 NOTE — Telephone Encounter (Signed)
Pt states he does really well on the increased dose (4 tablets a day) and has been on that dose for sometime now and if possible would like to stay on same dose of Valium rather than start Trazadone.    Please advise.

## 2016-08-19 NOTE — Telephone Encounter (Signed)
Called wife and left a message for call back.

## 2016-08-21 ENCOUNTER — Telehealth: Payer: Self-pay | Admitting: Medical

## 2016-08-21 MED ORDER — ALBUTEROL SULFATE HFA 108 (90 BASE) MCG/ACT IN AERS: 2.0000 | INHALATION_SPRAY | RESPIRATORY_TRACT | 1 refills | Status: DC | PRN

## 2016-08-21 NOTE — Telephone Encounter (Signed)
Caller name: Relationship to patient: Self Can be reached: [phone]  Pharmacy:  [facility] [name] - [address] [phone] (Phone) [phone] (Fax)     Reason for call: Request refill on albuterol (PROVENTIL HFA;VENTOLIN HFA) 108 (90 Base) MCG/ACT inhaler [[id number] Patient also request a call back from the nurse to ask a question.

## 2016-08-21 NOTE — Telephone Encounter (Signed)
The patient is concerned about the Trazadone making him sleep at night and he is still concerned that that he is only taking 3 tablets of his Valium instead of 4.   Patient is concerned about taking the medication to sleep and he will not be able to wake up in the night to go to the bathroom since he sleeps well at night time. He is also concerned that he will not wake up from his sleep if he starts taking the Trazadone.   Please advise.

## 2016-08-22 ENCOUNTER — Telehealth: Payer: Self-pay | Admitting: Medical

## 2016-08-22 MED FILL — PROAIR HFA 90 MCG INHALER: 108 (90 BAS | 17 days supply | Qty: 9 | Fill #0

## 2016-08-22 NOTE — Telephone Encounter (Signed)
Per a verbal order from the pcp the patient can take 2.5 in am, 5mg , 5 mg and 2.5mg  at night. He can try this way before he tries the Trazodone.   David Duke stated that he can take the higher doses if he feels the most anxious.

## 2016-08-22 NOTE — Telephone Encounter (Signed)
I spoke with the patients wife and gave her the instructions per Ramon DredgeEdward and she voices understanding.

## 2016-08-22 NOTE — Telephone Encounter (Addendum)
Pt has 5mg  valium tabs. He is calling back wanting to use qid. He has current rx to last one month. He is afraid to take trazadone. So I advise David Duke that  Dr. Laury AxonLowne and I had talked and he has current rx for tid(he can't us any more than 15 mg q day). So he could use 2.5 mg in am then 6 hours later 5 mg then 6 hours later 5 mg and then 4th dose 2.5 mg. That way he would not run out of his med before he is due( keeping in mind he signed contract). Asked holden to explain to wife as pt appears to have early dementia.That way med can be taken correctly.  Advised if  pt afraid to take trazadone presently then don't take.

## 2016-08-22 NOTE — Telephone Encounter (Signed)
Advise pt to take 1/2 tablet of trazodone at night rather than the whole tablet. And continue valium at tid. He should still be able to get up and use bathroom.

## 2016-08-22 NOTE — Telephone Encounter (Signed)
Also clarify with pt has he even tried the trazadone yet? Or is just a fear he has?

## 2016-08-26 ENCOUNTER — Telehealth: Payer: Self-pay | Admitting: Medical

## 2016-08-26 NOTE — Telephone Encounter (Signed)
Dr.Lowne,  Pt continues to use the valium at 5 mg qid. He will likely run out sooner before scheduled. He is adamant and his wife that he  needs QID. We had discussed his use and agreed on TID. I got his records and he has been using qid for some time.   If he runs out sooner. Wanted your advise if could rx at qid. I did rx sertraline to help with his anxiety but he still wants qid valium.   Wanted to send this to you with hopes you woud review before he runs out.  I offered during the interim if he wants to take qid then split one of the tabs and use manner such as .25 mg early am wait 6 hours use 5 mg then wait 6 hours use .25 mg then final dose at bedtime 5mg . But they are doing it like this.   Thanks, Ramon DredgeEdward

## 2016-08-26 NOTE — Telephone Encounter (Signed)
Caller name: Burt EkSabrina Espey Relationship to patient: wife Can be reached: [phone]  Reason for call: Pt has taken a few extra diazepam last week. Taking 4/day. He has dementia and anxiety and the 4th one per day seems to help him sleep better and stay more calm. They want to make sure he doesn't run out as his RX will need refilled sooner than usual.  Call after 4:30pm 12/19 or 12/20 (pt has appt with urology today).

## 2016-08-27 ENCOUNTER — Other Ambulatory Visit: Payer: Self-pay | Admitting: *Deleted

## 2016-08-27 ENCOUNTER — Telehealth: Payer: Self-pay | Admitting: *Deleted
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
## 2016-08-27 NOTE — Telephone Encounter (Signed)
Sched AWV 09/03/16 @2 .

## 2016-08-28 ENCOUNTER — Telehealth: Payer: Self-pay | Admitting: Medical

## 2016-08-28 NOTE — Telephone Encounter (Signed)
Dr. Laury AxonLowne,   Pt I discussed with you has been taking valium for years. I think 25 years on record review. He came to me on qid 5 valium. I wrote him for low dose sertaline and tid valium. We discussed this and that was the plan. Pt has been taking qid consistently despite my instruction. I am getting various calls from wife wanted me to ok increase of 5mg  dose qid. I explained our plan and my discussion with you. Sounds like he will be out of medication on Sep 02, 2016. Sounds like they will continue to insist on qid use going forward and this month will run out before scheduled refill. I don't feel comfortable with qid use for any age group. Particularly in 81 yo. So I wanted to know how to proceed. Do I give him one month supply at qid on this coming Tuesday and dismiss him from practice as I expect non compliance will be continued problem.  Thanks Ramon DredgeEdward

## 2016-08-28 NOTE — Telephone Encounter (Signed)
Would you let pt wife know that the plan has not changed. Awaiting on Dr. Laury AxonLowne decision if she would agree to 4 times daily. She is in on Tuesday and that would be the day he runs out if he has been  Taking  the valium 4 times a day(every day) despite the instruction of 3 times daily use.

## 2016-08-28 NOTE — Telephone Encounter (Signed)
Patient wife aware awaiting on decision from Dr Laury AxonLowne

## 2016-08-28 NOTE — Telephone Encounter (Signed)
Caller name:[name] Relationship to patient:spouse Can be [phone] Pharmacy:  Reason for call:Wife is requesting med dosage to be changed on diazepam (VALIUM) 5 MG tablet to 4 tabs everyday. Please advise

## 2016-08-29 NOTE — Telephone Encounter (Signed)
PCP is in the office. David Duke is awaiting decision/instruction from Dr. Zola ButtonLowne-Chase, his supervising provider. She will return to office on Tuesday. Per David Duke's previous note, he should have just enough medication to last until that date.

## 2016-08-29 NOTE — Telephone Encounter (Signed)
Wife calling back checking on the status of message below, informed spouse PCP is out of the office but wife would like a follow up call today regarding medication refill best # [phone]

## 2016-08-29 NOTE — Telephone Encounter (Signed)
I have reviewed pt call back messages. I wrote 90 tabs with initial instruction of tid use.   It appears pt has been using 4 times daily.   I have given enough to last for 30 days. Original prescription was written on 08-11-2016.   Even if he is using 4 times daily that would last him until December 26 th which is the day that I informed pt wife I would discuss with Dr. Laury AxonLowne him using 4 times instead of the TID use.  So pt should not run out  before Tuesday unless he is taking it even more than 4 times daily?

## 2016-08-29 NOTE — Telephone Encounter (Signed)
Patient called asking if a provider was going to give him a Rx for his Valium. Read notes to patient and he seemed unaware that wife had informed provider that she was giving him 4 tablets per day instead of 3. Patient asked wife if she had told the office that she was giving him 4 tablets instead of 3 and she admitted to patient that she had been giving him 4 because it seems to keep him calmer. Patient states he will not have enough to last him until Tuesday and wanted to know what could be done. Plse adv.

## 2016-08-29 NOTE — Telephone Encounter (Signed)
Patient's wife called to follow up on this medication situation again. She states that patient does not have enough to last until Tuesday. I asked how many the patient has been taking a day and she stated as far as she knows 3.5-4 a day. She states that she doesn't know if she has taken any more when she is gone but as far as she knows its been 3.5-4 pills a day. I let her know that per Randa EvensEdwards note that even with taking 4 pills a day which was not the current instruction he should still be fine until Tuesday. Patient's wife states that he has 13 whole pills, and 9 half pills left. Please advise   Phone: [phone]

## 2016-08-29 NOTE — Telephone Encounter (Signed)
LMOM with contact name and number to reiterate: Rx is px to be taken TID PRN and patient was given #90 dispense on 08/11/16; if patient were to be taking more than the px amount [as reported at QID], he should still have enough medication to last him until Tuesday, 09/02/16, when supervising provider can be consulted with; therefore, we will not be giving a new prescription until after that time. Informed that if patient were to have any new and/or worsening symptoms over the weekend/holiday time period to please seek assessment & evaluation at ED/SLS 12/22

## 2016-08-29 NOTE — Telephone Encounter (Signed)
Per count below, pt should still have enough to last at least through 12/25. To PCP for FYI.

## 2016-09-02 ENCOUNTER — Telehealth: Payer: Self-pay

## 2016-09-02 NOTE — Telephone Encounter (Signed)
[facility] note received via fax  Call:   Date: 08/29/16   Time:  8:10 pm   Caller:  David Duke (Spouse) Return number:  [phone]  Nurse: Kathline Magicoug Peterson, RN  Chief Complaint:  Refill   Reason for call:  Caller's husband had a stroke in March and had rehab.  Now that he is home has dementia and sundowners syndrome.  Caller was wanting to know if he could get a small refill to tide him over until he sees Dr. Alvira MondaySaguier.  He has 12 pills left but take 3-4 pills a day.  Caller needs to some for Tuesday and Wednesday.    Additional documentation:  Caller states that husband is on diazepam 5 mg 4 per day asking about a refill.    Disposition:  Spoke with Dr. Selena BattenKim, patient will have wait until office opens for prescription renewal, or go to UC for short term prescription.     Pt has an appt tomorrow (09/03/16) scheduled with Ramon DredgeEdward.   Message routed to PCP for FYI.

## 2016-09-02 NOTE — Telephone Encounter (Signed)
We will need to find him a psych to help with treatment and maybe neuro for dementia if bad enough

## 2016-09-02 NOTE — Progress Notes (Deleted)
Subjective:   David Duke is a 81 y.o. male who presents for an Initial Medicare Annual Wellness Visit.  Review of Systems  No ROS.  Medicare Wellness Visit.    Sleep patterns: {SX; SLEEP PATTERNS:18802::"feels rested on waking","does not get up to void","gets up *** times nightly to void","*** hours nightly"}.   Home Safety/Smoke Alarms:   Living environment; residence and Firearm Safety: {Rehab home environment / accessibility:30080::"no firearms","firearms stored safely"}. Seat Belt Safety/Bike Helmet:   Counseling:   Eye Exam-  Dental-  Male:   CCS-     PSA-  Lab Results  Component Value Date   PSA 17.8 (H) 07/30/2016       Objective:    There were no vitals filed for this visit. There is no height or weight on file to calculate BMI.  Current Medications (verified) Outpatient Encounter Prescriptions as of 09/03/2016  Medication Sig  . albuterol (PROVENTIL HFA;VENTOLIN HFA) 108 (90 Base) MCG/ACT inhaler Inhale 2 puffs into the lungs every 4 (four) hours as needed for wheezing or shortness of breath (or cough).  [name]. atorvastatin (LIPITOR) 20 MG tablet Take 1 tablet (20 mg total) by mouth daily at 6 PM.  . diazepam (VALIUM) 5 MG tablet 1 tab po q 8 hours as needed severe anxiety  . FINASTERIDE PO Take 5 mg by mouth daily.   . fluticasone (FLONASE) 50 MCG/ACT nasal spray Place 1 spray into both nostrils daily. (Patient taking differently: Place 1 spray into both nostrils daily as needed for allergies or rhinitis. )  . sertraline (ZOLOFT) 25 MG tablet Take 1 tablet (25 mg total) by mouth daily.  . traZODone (DESYREL) 50 MG tablet Take 0.5-1 tablets (25-50 mg total) by mouth at bedtime as needed for sleep.   No facility-administered encounter medications on file as of 09/03/2016.     Allergies (verified) Penicillins   History: Past Medical History:  Diagnosis Date  . AKI (acute kidney injury) (HCC)   . Anxiety   . Blind right eye   . BPH (benign prostatic  hypertrophy) with urinary retention   . COPD (chronic obstructive pulmonary disease) (HCC)   . Depression   . High cholesterol   . Macular degeneration of both eyes   . Renal disorder   . Shortness of breath dyspnea   . Stroke [facility](HCC)    Past Surgical History:  Procedure Laterality Date  . NO PAST SURGERIES     Family History  Problem Relation Age of Onset  . Colon cancer Mother   . Cerebral aneurysm Father    Social History   Occupational History  . Not on file.   Social History Main Topics  . Smoking status: Current Every Day Smoker    Packs/day: 0.50    Years: 60.00    Types: Cigarettes  . Smokeless tobacco: Never Used  . Alcohol use No  . Drug use: No  . Sexual activity: Not on file   Tobacco Counseling Ready to quit: Not Answered Counseling given: Not Answered   Activities of Daily Living In your present state of health, do you have any difficulty performing the following activities: 08/19/2016 06/10/2016  Hearing? Y -  Vision? N -  Difficulty concentrating or making decisions? Y -  Walking or climbing stairs? N -  Dressing or bathing? N -  Doing errands, shopping? Y N  Preparing Food and eating ? N -  Using the Toilet? N -  In the past six months, have you accidently leaked urine?  Y -  Do you have problems with loss of bowel control? N -  Managing your Medications? N -  Managing your Finances? Y -  Housekeeping or managing your Housekeeping? Y -  Some recent data might be hidden    Immunizations and Health Maintenance Immunization History  Administered Date(s) Administered  . Tdap 05/18/2016   There are no preventive care reminders to display for this patient.  Patient Care Team: Esperanza RichtersEdward Saguier, PA-C as PCP - General (Internal Medicine) Alinda Moneyrystal K Hutchinson, RN as [facility] Karolee StampsJoanna D Saporito, LCSW as [facility]  Indicate any recent Medical Services you may have received from other than  [facility] providers in the past year (date may be approximate).    Assessment:   This is a routine wellness examination for David Duke. Physical assessment deferred to PCP.   Hearing/Vision screen No exam data present  Dietary issues and exercise activities discussed:   Diet (meal preparation, eat out, water intake, caffeinated beverages, dairy products, fruits and vegetables): {Desc; diets:16563} Breakfast: Lunch:  Dinner:      Goals    None     Depression Screen PHQ 2/9 Scores 08/19/2016 07/30/2016  PHQ - 2 Score 0 0    Fall Risk Fall Risk  08/19/2016 07/30/2016 01/03/2016  Falls in the past year? No No No  Risk for fall due to : Medication side effect - -    Cognitive Function:        Screening Tests Health Maintenance  Topic Date Due  . PNA vac Low Risk Adult (1 of 2 - PCV13) 09/08/2016 (Originally 01/14/2002)  . INFLUENZA VACCINE  12/06/2016 (Originally 04/08/2016)  . ZOSTAVAX  09/08/2018 (Originally 01/14/1997)  . TETANUS/TDAP  05/18/2026        Plan:   ***  During the course of the visit David Duke was educated and counseled about the following appropriate screening and preventive services:   Vaccines to include Pneumoccal, Influenza, Hepatitis B, Td, Zostavax, HCV  Colorectal cancer screening  Cardiovascular disease screening  Diabetes screening  Glaucoma screening  Nutrition counseling  Prostate cancer screening  Smoking cessation counseling  Patient Instructions (the written plan) were given to the patient.   Avon GullyBritt, Akyla Vavrek Angel, [place]RN   09/02/2016

## 2016-09-02 NOTE — Telephone Encounter (Signed)
I called patient to remind him of his appointment on Wednesday and wife started to tell me about how patient had slight dementia and needed to take 4 pills a day instead of three. She then abruptly stated that her husband was awake and he would talk to me she said apparently I'm telling you the wrong information. Then patient got on the phone stating that he was unaware that he was not supposed to take over three pills of medication a day since his previous doctor prescribed 4 pills a day. That being said he stated that he would like a refill of his medication at 3 pills a day. He then went into a long story of how is son threatened to get his medication taken away and said he knew people at our office who would make it more difficult to get his medication. He is asking if we have been in contact with anyone outside of him and his wife. Please advise   Phone: [phone]

## 2016-09-02 NOTE — Telephone Encounter (Addendum)
Pt wife had called up here various times in past week wanting ok to rx valium 5 mg qid. Since per her report pt was insisting he use it this way. He does have some dementia. I checked with Dr. Laury AxonLowne and she wants him to only use tid. Was not supposed to run out of meds until around January 4th.   I talked with Dr. Laury AxonLowne and she stated if they are still demanding qid then can write him 30 days worth but  refer to psychiatrist.  If they are willing to stick with TID then we could refill for 30 days.  Will go ahead and refer pt to psychiatrist tomorrow when pt is in.   If pt demands qid and won't see psychiatrist then will have to dismiss pt. In that event would likely rx 30 days but that be our last/only  rx of qid.

## 2016-09-02 NOTE — Progress Notes (Deleted)
Pre visit review using our clinic review tool, if applicable. No additional management support is needed unless otherwise documented below in the visit note. 

## 2016-09-03 ENCOUNTER — Other Ambulatory Visit: Payer: Self-pay | Admitting: *Deleted

## 2016-09-03 ENCOUNTER — Ambulatory Visit: Payer: Self-pay | Admitting: *Deleted

## 2016-09-03 ENCOUNTER — Ambulatory Visit (INDEPENDENT_AMBULATORY_CARE_PROVIDER_SITE_OTHER): Payer: Medicare Other | Admitting: Medical

## 2016-09-03 ENCOUNTER — Encounter: Payer: Self-pay | Admitting: Medical

## 2016-09-03 VITALS — BP 128/80 | HR 99 | Temp 98.2°F | Ht 68.0 in | Wt 140.6 lb

## 2016-09-03 DIAGNOSIS — E785 Hyperlipidemia, unspecified: Secondary | ICD-10-CM | POA: Diagnosis not present

## 2016-09-03 DIAGNOSIS — J441 Chronic obstructive pulmonary disease with (acute) exacerbation: Secondary | ICD-10-CM

## 2016-09-03 DIAGNOSIS — F411 Generalized anxiety disorder: Secondary | ICD-10-CM | POA: Diagnosis not present

## 2016-09-03 MED ORDER — DIAZEPAM 5 MG PO TABS: ORAL_TABLET | ORAL | 0 refills | Status: DC

## 2016-09-03 MED ORDER — ATORVASTATIN CALCIUM 20 MG PO TABS: 20.0000 mg | ORAL_TABLET | Freq: Every day | ORAL | 2 refills | Status: DC

## 2016-09-03 MED FILL — ATORVASTATIN 20 MG TABLET: 20 | 30 days supply | Qty: 30 | Fill #0

## 2016-09-03 MED FILL — diazePAM 5 MG TABS: 5 | 30 days supply | Qty: 90 | Fill #0

## 2016-09-03 NOTE — Progress Notes (Signed)
Subjective:    Patient ID: David BarlowDavid R Duke, male    DOB: 11/12/1934, 81 y.o.   MRN: [id number]  HPI  Pt has been taking medication/valium  4 times a day of his 5 mg tabs. Pt has been stressed about his son trying to hack his bank account. So this has stressed pt out. Pt has run out of medication early. Today only has 2   tablets left. Pt been on medication for 25 years and by records had been on qid.  Pt and wife have been calling and is concerned about running out early.   I had advised just tid use per our agreement. I had advised his wife past Tuesday  only after her repeated calls   that she could use 4 times a day but only if using reduced dose and spreading out such as 2.5 mg in early am then  5 mg then 6 hours later, then 2.5 mq 6 hours later, then last dose 5 mg at end of the day. This was explained to her in detail only as a way to accommodate them and so he would not run out early. Explained in detail this to her. She expressed understanding on past tuesday  However, it appears have been using 5 mg qid many of the days.  Late in the day he gets most anxious.   Pt thinks his memory is getting better after his stroke.   Pt has access to bottle.( I explained to wife that with his probable dementia he should not be dispensing his own meds) When I explain things to patient he often appears confused and not comprehending my instruction.  Pt had good friend to bring him in to office today.     Review of Systems  Constitutional: Negative for chills and fatigue.  HENT: Negative for congestion.   Respiratory: Negative for cough, chest tightness, shortness of breath and wheezing.   Cardiovascular: Negative for chest pain and palpitations.  Musculoskeletal: Negative for back pain.  Neurological: Negative for dizziness, speech difficulty, weakness and numbness.  Hematological: Negative for adenopathy. Does not bruise/bleed easily.  Psychiatric/Behavioral: Positive for confusion.  Negative for behavioral problems, dysphoric mood, self-injury, sleep disturbance and suicidal ideas. The patient is nervous/anxious.        Mild and likely dementia.   Past Medical History:  Diagnosis Date  . AKI (acute kidney injury) (HCC)   . Anxiety   . Blind right eye   . BPH (benign prostatic hypertrophy) with urinary retention   . COPD (chronic obstructive pulmonary disease) (HCC)   . Depression   . High cholesterol   . Macular degeneration of both eyes   . Renal disorder   . Shortness of breath dyspnea   . Stroke [facility](HCC)      Social History   Social History  . Marital status: Married    Spouse name: N/A  . Number of children: N/A  . Years of education: N/A   Occupational History  . Not on file.   Social History Main Topics  . Smoking status: Current Every Day Smoker    Packs/day: 0.50    Years: 60.00    Types: Cigarettes  . Smokeless tobacco: Never Used  . Alcohol use No  . Drug use: No  . Sexual activity: Not on file   Other Topics Concern  . Not on file   Social History Narrative  . No narrative on file    Past Surgical History:  Procedure Laterality Date  .  NO PAST SURGERIES      Family History  Problem Relation Age of Onset  . Colon cancer Mother   . Cerebral aneurysm Father     Allergies  Allergen Reactions  . Penicillins Hives, Diarrhea and Nausea And Vomiting    Current Outpatient Prescriptions on File Prior to Visit  Medication Sig Dispense Refill  . albuterol (PROVENTIL HFA;VENTOLIN HFA) 108 (90 Base) MCG/ACT inhaler Inhale 2 puffs into the lungs every 4 (four) hours as needed for wheezing or shortness of breath (or cough). 1 Inhaler 1  . FINASTERIDE PO Take 5 mg by mouth daily.     . fluticasone (FLONASE) 50 MCG/ACT nasal spray Place 1 spray into both nostrils daily. (Patient taking differently: Place 1 spray into both nostrils daily as needed for allergies or rhinitis. )  2  . sertraline (ZOLOFT) 25 MG tablet Take 1 tablet (25 mg  total) by mouth daily. (Patient not taking: Reported on 09/03/2016) 30 tablet 2  . traZODone (DESYREL) 50 MG tablet Take 0.5-1 tablets (25-50 mg total) by mouth at bedtime as needed for sleep. (Patient not taking: Reported on 09/03/2016) 30 tablet 1   No current facility-administered medications on file prior to visit.     BP 128/80   Pulse (!) 113   Temp 98.2 F (36.8 C) (Oral)   Ht 5\' 8"  (1.727 m)   Wt 140 lb 9.6 oz (63.8 kg)   SpO2 97%   BMI 21.38 kg/m       Objective:   Physical Exam  General Mental Status- Alert. General Appearance- Not in acute distress.     Neck Carotid Arteries- Normal color. Moisture- Normal Moisture. No carotid bruits. No JVD.  Chest and Lung Exam Auscultation: Breath Sounds:-Normal.  Cardiovascular Auscultation:Rythm- Regular. Murmurs & Other Heart Sounds:Auscultation of the heart reveals- No Murmurs.  Abdomen Inspection:-Inspeection Normal. Palpation/Percussion:Note:No mass. Palpation and Percussion of the abdomen reveal- Non Tender, Non Distended + BS, no rebound or guarding.  Neurologic Cranial Nerve exam:- CN III-XII intact(No nystagmus), symmetric smile. Strength:- 5/5 equal and symmetric strength both upper and lower extremities.      Assessment & Plan:  For your anxiety I am refilling your valium. It is early and I have explained how to use in order for rx to last one month.   I am going to refer you to psychiatrist. We gave you list of psychiatrist to call. You need to call first and then call our office notifying which psychiatrist office you called. Then I will ask staff to push through the referral.  If you run out of valium early again then this may result in dismissal  from practice and you having to find provider willing to write you med at 4 times a day(20 mg total daily dose). I did provide you a way to use qid but no more than 15 mg per day total.  Rx refill of your lipitor. You should have refill of your  inhaler.  Follow up in one month.  Keep attending your neurology appointment.  I want wife to dispense meds.(this is a must)  Note I did talk with pharmacist on why filling valium early. Concern he will be out within one day. Concern for seizure if med stopped abruptly as he has been on valium for 25 years by old record review.

## 2016-09-03 NOTE — Patient Outreach (Signed)
[facility]) Care Management  09/03/2016  David BarlowDavid R Duke 09/03/1935 [id number]   CSW made a second attempt to try and contact patient today to perform phone assessment, as well as assess and assist with social needs and services, without success.  A HIPAA complaint message was left for patient on voicemail.  CSW is currently awaiting a return call.  CSW will make a third and final outreach attempt in one week, if CSW does not receive a return call from patient in the meantime. David BadJoanna Luvena Duke, BSW, MSW, LCSW  Licensed Restaurant manager, fast foodClinical Social Worker  [facility] Care Management [place] [facility]  [address] [place], [place], [name] [address] Physical Address-[address], [place], [name] [address] Toll Free Main # [phone] Fax # [phone] Cell # [phone]  Fax # [phone]  [email or web address]

## 2016-09-03 NOTE — Telephone Encounter (Signed)
Would you finish his exam note and close so I can close my note. Will you let me know when you are done. Thanks.

## 2016-09-03 NOTE — Patient Instructions (Addendum)
For your anxiety I am refilling your valium. It is early and I have explained how to use in order for rx to last one month.   I am going to refer you to psychiatrist. We gave you list of psychiatrist to call. You need to call first and then call our office notifying which psychiatrist office you called. Then I will ask staff to push through the referral.  If you run out of valium early again then this may result in dismissal  from practice and you having to find provider willing to write you med at 4 times a day(20 mg total daily dose). I did provide you a way to use qid but no more than 15 mg per day total.  Rx refill of your lipitor. You should have refill of your inhaler.  Follow up in one month.  Keep attending your neurology appointment.  I want wife to dispense meds.(this is a must)  Pt given list of psychiatrist to choose from and call.

## 2016-09-04 NOTE — Telephone Encounter (Signed)
I am finished. I apologize. I forgot to delete since he left without meeting with me.

## 2016-09-09 ENCOUNTER — Ambulatory Visit: Payer: Medicare Other | Admitting: Medical

## 2016-09-10 ENCOUNTER — Telehealth: Payer: Self-pay | Admitting: *Deleted

## 2016-09-10 NOTE — Telephone Encounter (Signed)
LMVM for wife, David Duke, on home #, that need to reschedule appt from 10-08-16 4:15pm to earlier time that day or another date.  Please call us back. Phone operators may reschedule appt if calls back and let me know.  Thanks.

## 2016-09-11 ENCOUNTER — Ambulatory Visit: Payer: Medicare Other | Admitting: Medical

## 2016-09-12 ENCOUNTER — Other Ambulatory Visit: Payer: Self-pay | Admitting: *Deleted

## 2016-09-12 ENCOUNTER — Encounter: Payer: Self-pay | Admitting: *Deleted

## 2016-09-12 ENCOUNTER — Other Ambulatory Visit: Payer: Self-pay | Admitting: Pharmacist

## 2016-09-12 NOTE — Patient Outreach (Signed)
[facility]) Care Management  09/12/2016  David BarlowDavid R Duke 06/25/1935 [id number]   CSW made a third and final attempt to try and contact patient today to perform phone assessment, as well as assess and assist with social work needs and services, without success.  A HIPAA compliant message was left for patient on voicemail.  CSW  continues to await a return call.  CSW will mail an outreach letter to patient's home, encouraging patient to contact CSW at their earliest convenience, if patient is interested in receiving social work services through CSW with [facility].  If CSW does not receive a return call from patient within the next 10 business days, CSW will proceed with case closure.  Required number of phone attempts will have been made and outreach letter mailed.   Danford BadJoanna Sajjad Honea, BSW, MSW, LCSW  Licensed Restaurant manager, fast foodClinical Social Worker  [facility] Care Management [place] [facility]  Mailing [place], [place], [name] [address] Physical Address-[address], [place], [name] [address] Toll Free Main # [phone] Fax # [phone] Cell # [phone]  Fax # [phone]  [email or web address]

## 2016-09-12 NOTE — Patient Outreach (Signed)
Marcell Barlowavid R Trower was referred to pharmacy for questions about his medication, particularly his Valium. Left a HIPAA compliant message on the patient's voicemail. If have not heard from patient by next week, will give him another call at that time.  Duanne MoronElisabeth Tacey Dimaggio, PharmD, [facility]BCACP Clinical Pharmacist [facility] [phone]

## 2016-09-15 NOTE — Telephone Encounter (Signed)
I spoke to pt today.  Wife not available at this time.  I relayed about changing his appt.  I will call back later.

## 2016-09-16 NOTE — Telephone Encounter (Signed)
Spoke with wife and appt changed to 1515 same day.

## 2016-09-17 ENCOUNTER — Ambulatory Visit: Payer: Self-pay | Admitting: Pharmacist

## 2016-09-17 ENCOUNTER — Other Ambulatory Visit: Payer: Self-pay | Admitting: Pharmacist

## 2016-09-17 NOTE — Patient Outreach (Addendum)
David Duke was referred to pharmacy for questions about his medication, particularly his Valium. Outreach attempt #2. Left a HIPAA compliant message on the patient's voicemail. If have not heard from patient by 09/19/16, will give him another call at that time.  Duanne MoronElisabeth Raif Chachere, PharmD, [facility]BCACP Clinical Pharmacist [facility] [phone]

## 2016-09-18 ENCOUNTER — Emergency Department (HOSPITAL_BASED_OUTPATIENT_CLINIC_OR_DEPARTMENT_OTHER): Payer: Medicare Other

## 2016-09-18 ENCOUNTER — Emergency Department (HOSPITAL_BASED_OUTPATIENT_CLINIC_OR_DEPARTMENT_OTHER)
Admission: EM | Admit: 2016-09-18 | Discharge: 2016-09-18 | Disposition: A | Discharge status: Home / Self Care | Payer: Medicare Other | Attending: Emergency Medicine | Admitting: Emergency Medicine

## 2016-09-18 ENCOUNTER — Telehealth: Payer: Self-pay | Admitting: Medical

## 2016-09-18 ENCOUNTER — Encounter (HOSPITAL_BASED_OUTPATIENT_CLINIC_OR_DEPARTMENT_OTHER): Payer: Self-pay | Admitting: *Deleted

## 2016-09-18 DIAGNOSIS — Z960 Presence of urogenital implants: Secondary | ICD-10-CM | POA: Insufficient documentation

## 2016-09-18 DIAGNOSIS — R4182 Altered mental status, unspecified: Secondary | ICD-10-CM | POA: Insufficient documentation

## 2016-09-18 DIAGNOSIS — T83511A Infection and inflammatory reaction due to indwelling urethral catheter, initial encounter: Secondary | ICD-10-CM | POA: Insufficient documentation

## 2016-09-18 DIAGNOSIS — Y733 Surgical instruments, materials and gastroenterology and urology devices (including sutures) associated with adverse incidents: Secondary | ICD-10-CM | POA: Diagnosis not present

## 2016-09-18 DIAGNOSIS — J449 Chronic obstructive pulmonary disease, unspecified: Secondary | ICD-10-CM | POA: Diagnosis not present

## 2016-09-18 DIAGNOSIS — F419 Anxiety disorder, unspecified: Secondary | ICD-10-CM | POA: Diagnosis not present

## 2016-09-18 DIAGNOSIS — N390 Urinary tract infection, site not specified: Secondary | ICD-10-CM

## 2016-09-18 DIAGNOSIS — F17210 Nicotine dependence, cigarettes, uncomplicated: Secondary | ICD-10-CM | POA: Diagnosis not present

## 2016-09-18 DIAGNOSIS — R531 Weakness: Secondary | ICD-10-CM | POA: Diagnosis present

## 2016-09-18 DIAGNOSIS — R002 Palpitations: Secondary | ICD-10-CM | POA: Diagnosis not present

## 2016-09-18 DIAGNOSIS — Z79899 Other long term (current) drug therapy: Secondary | ICD-10-CM | POA: Insufficient documentation

## 2016-09-18 LAB — CBC
HCT: 42.3 % (ref 39.0–52.0)
HEMOGLOBIN: 13.9 g/dL (ref 13.0–17.0)
MCH: 30.4 pg (ref 26.0–34.0)
MCHC: 32.9 g/dL (ref 30.0–36.0)
MCV: 92.6 fL (ref 78.0–100.0)
Platelets: 240 10*3/uL (ref 150–400)
RBC: 4.57 MIL/uL (ref 4.22–5.81)
RDW: 13.4 % (ref 11.5–15.5)
WBC: 8.4 10*3/uL (ref 4.0–10.5)

## 2016-09-18 LAB — HEPATIC FUNCTION PANEL
ALT: 26 U/L (ref 17–63)
AST: 26 U/L (ref 15–41)
Albumin: 3.9 g/dL (ref 3.5–5.0)
Alkaline Phosphatase: 73 U/L (ref 38–126)
BILIRUBIN TOTAL: 0.5 mg/dL (ref 0.3–1.2)
Total Protein: 7.1 g/dL (ref 6.5–8.1)

## 2016-09-18 LAB — RAPID URINE DRUG SCREEN, HOSP PERFORMED
Amphetamines: NOT DETECTED
BENZODIAZEPINES: POSITIVE — AB
Barbiturates: NOT DETECTED
COCAINE: NOT DETECTED
Opiates: NOT DETECTED
TETRAHYDROCANNABINOL: NOT DETECTED

## 2016-09-18 LAB — URINALYSIS, ROUTINE W REFLEX MICROSCOPIC
BILIRUBIN URINE: NEGATIVE
GLUCOSE, UA: NEGATIVE mg/dL
KETONES UR: NEGATIVE mg/dL
Nitrite: POSITIVE — AB
PH: 7.5 (ref 5.0–8.0)
Protein, ur: NEGATIVE mg/dL
SPECIFIC GRAVITY, URINE: 1.003 — AB (ref 1.005–1.030)

## 2016-09-18 LAB — URINALYSIS, MICROSCOPIC (REFLEX)

## 2016-09-18 LAB — TROPONIN I

## 2016-09-18 LAB — BASIC METABOLIC PANEL
ANION GAP: 9 (ref 5–15)
BUN: 15 mg/dL (ref 6–20)
CHLORIDE: 104 mmol/L (ref 101–111)
CO2: 26 mmol/L (ref 22–32)
Calcium: 9.1 mg/dL (ref 8.9–10.3)
Creatinine, Ser: 1.3 mg/dL — ABNORMAL HIGH (ref 0.61–1.24)
GFR calc Af Amer: 59 mL/min — ABNORMAL LOW (ref >60)
GFR, EST NON AFRICAN AMERICAN: 51 mL/min — AB (ref >60)
GLUCOSE: 109 mg/dL — AB (ref 65–99)
POTASSIUM: 4 mmol/L (ref 3.5–5.1)
Sodium: 139 mmol/L (ref 135–145)

## 2016-09-18 LAB — ETHANOL

## 2016-09-18 MED ORDER — CIPROFLOXACIN HCL 500 MG PO TABS: 500.0000 mg | ORAL_TABLET | Freq: Two times a day (BID) | ORAL | 0 refills | Status: DC

## 2016-09-18 MED ORDER — SODIUM CHLORIDE 0.9 % IV BOLUS (SEPSIS)
1000.0000 mL | Freq: Once | INTRAVENOUS | Status: AC
  Administered 2016-09-18: 1000 mL via INTRAVENOUS

## 2016-09-18 MED ORDER — CIPROFLOXACIN HCL 500 MG PO TABS
500.0000 mg | ORAL_TABLET | Freq: Once | ORAL | Status: AC
  Administered 2016-09-18: 500 mg via ORAL
  Filled 2016-09-18: qty 1

## 2016-09-18 MED ORDER — SODIUM CHLORIDE 0.9 % IV SOLN
INTRAVENOUS | Status: DC
  Administered 2016-09-18: 18:00:00 via INTRAVENOUS

## 2016-09-18 NOTE — Telephone Encounter (Signed)
Patient Name: David Duke  DOB: 10/20/1934    Initial Comment Caller states her husband is feeling shaking, not feeling right.    Nurse Assessment  Nurse: Nicanor Bakeosta, RN, Marylene LandAngela Date/Time (Eastern Time): 09/18/2016 3:02:15 PM  Confirm and document reason for call. If symptomatic, describe symptoms. ---Caller states husband is c/o being dizzy and shaky and palms sweaty. He is now telling wife he does not want to go to the ER but he can wait in the office. He has not checked his vital signs- "my BP machine doesnt work". The doctor told him to take an extra half of a generic valium. He took the half and it didnt do anything so he just took a whole one.  Does the patient have any new or worsening symptoms? ---Yes  Will a triage be completed? ---Yes  Related visit to physician within the last 2 weeks? ---Yes  Does the PT have any chronic conditions? (i.e. diabetes, asthma, etc.) ---Yes  List chronic conditions. ---stroke, past issue with shaking  Is this a behavioral health or substance abuse call? ---No     Guidelines    Guideline Title Affirmed Question Affirmed Notes  Anxiety and Panic Attack [1] Lightheadedness or dizziness AND [2] persists > 10 minutes AND [3] not relieved by reassurance provided by triager    Final Disposition User   Go to ED Now [place], RN, Marylene LandAngela    Comments  Warm transferred pt to speak with Apolonio SchneidersAshleigh the office nurse.   Referrals  GO TO FACILITY UNDECIDED   Disagree/Comply: Disagree  Disagree/Comply Reason: Disagree with instructions

## 2016-09-18 NOTE — ED Provider Notes (Signed)
[facility] Provider Note   CSN: [id number] Arrival date & time: 09/18/16  1545     History   Chief Complaint Chief Complaint  Patient presents with  . Weakness    HPI David Duke is a 82 y.o. male.  Pt presents to the ED with anxiety.  The pt said that he has been more anxious since the first of the year.  The pt has been taking his valium more than prescribed.  His old PCP prescribed valium QID, but his new PCP has him on it TID.  He said the QID helped him stay more stable.   Pt was seen by his PCP on 12/27 who referred him to a psychiatrist (pt has not yet seen psychiatrist).  His pcp also spoke with pt regarding not taking his valium more than directed.  The pt said he's been getting scared to stay alone.  He feels his heart race, and he gets sweaty palms.  The pt denies cp or sob.  Pt said that he could not sleep last night which is unusual for him.      Past Medical History:  Diagnosis Date  . AKI (acute kidney injury) (HCC)   . Anxiety   . Blind right eye   . BPH (benign prostatic hypertrophy) with urinary retention   . COPD (chronic obstructive pulmonary disease) (HCC)   . Depression   . High cholesterol   . Macular degeneration of both eyes   . Renal disorder   . Shortness of breath dyspnea   . Stroke [facility])     Patient Active Problem List   Diagnosis Date Noted  . History of stroke in prior 3 months 01/22/2016  . Chronic tension-type headache, not intractable   . Acute frontal sinusitis   . 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 Gait disturbance, post-stroke   . Ataxia, late effect of cerebrovascular disease   . BPH (benign prostatic hyperplasia)   . Urinary retention   . Adjustment disorder with mixed anxiety and depressed mood   . Chronic obstructive pulmonary disease (HCC)   . Cephalalgia   . AKI (acute kidney injury) (HCC)   . Macular degeneration   . Prediabetes   . Hypokalemia   . Absolute anemia   .  Aphasia   . Acute encephalopathy 11/25/2015  . Hyponatremia 11/25/2015  . Acute kidney injury (HCC) 11/25/2015  . Depression   . Anxiety   . COPD (chronic obstructive pulmonary disease) (HCC)   . ICH (intracerebral hemorrhage) (HCC) 11/22/2015    Past Surgical History:  Procedure Laterality Date  . NO PAST SURGERIES         Home Medications    Prior to Admission medications   Medication Sig Start Date End Date Taking? Authorizing Provider  albuterol (PROVENTIL HFA;VENTOLIN HFA) 108 (90 Base) MCG/ACT inhaler Inhale 2 puffs into the lungs every 4 (four) hours as needed for wheezing or shortness of breath (or cough). 08/21/16   Ramon Dredge Saguier, PA-C  atorvastatin (LIPITOR) 20 MG tablet Take 1 tablet (20 mg total) by mouth daily at 6 PM. 09/03/16   Esperanza Richters, PA-C  ciprofloxacin (CIPRO) 500 MG tablet Take 1 tablet (500 mg total) by mouth 2 (two) times daily. 09/18/16   Jacalyn Lefevre, MD  diazepam (VALIUM) 5 MG tablet 1 tab po q 8 hours as needed severe anxiety 09/03/16   Esperanza Richters, PA-C  FINASTERIDE PO Take 5 mg by mouth daily.  Historical Provider, MD  fluticasone (FLONASE) 50 MCG/ACT nasal spray Place 1 spray into both nostrils daily. Patient taking differently: Place 1 spray into both nostrils daily as needed for allergies or rhinitis.  12/11/15   Jacquelynn Cree, PA-C  sertraline (ZOLOFT) 25 MG tablet Take 1 tablet (25 mg total) by mouth daily. Patient not taking: Reported on 09/03/2016 08/11/16   Esperanza Richters, PA-C  traZODone (DESYREL) 50 MG tablet Take 0.5-1 tablets (25-50 mg total) by mouth at bedtime as needed for sleep. Patient not taking: Reported on 09/03/2016 08/18/16   Esperanza Richters, PA-C    Family History Family History  Problem Relation Age of Onset  . Colon cancer Mother   . Cerebral aneurysm Father     Social History Social History  Substance Use Topics  . Smoking status: Current Every Day Smoker    Packs/day: 0.50    Years: 60.00    Types:  Cigarettes  . Smokeless tobacco: Never Used  . Alcohol use No     Allergies   Penicillins   Review of Systems Review of Systems  Cardiovascular: Positive for palpitations.  Psychiatric/Behavioral: Positive for sleep disturbance. The patient is nervous/anxious.   All other systems reviewed and are negative.    Physical Exam Updated Vital Signs BP 137/88   Pulse 104   Temp 98 F (36.7 C) (Oral)   Resp 18   Ht 5\' 8"  (1.727 m)   Wt 140 lb (63.5 kg)   SpO2 99%   BMI 21.29 kg/m   Physical Exam  Constitutional: He is oriented to person, place, and time. He appears well-developed and well-nourished.  HENT:  Head: Normocephalic and atraumatic.  Right Ear: External ear normal.  Left Ear: External ear normal.  Nose: Nose normal.  Mouth/Throat: Oropharynx is clear and moist.  Eyes: Conjunctivae and EOM are normal.  Right eye blind (chronic)  Neck: Normal range of motion. Neck supple.  Cardiovascular: Normal rate, regular rhythm, normal heart sounds and intact distal pulses.   Pulmonary/Chest: Effort normal and breath sounds normal.  Abdominal: Soft. Bowel sounds are normal.  Musculoskeletal: Normal range of motion.  Neurological: He is alert and oriented to person, place, and time.  Skin: Skin is warm and dry. Capillary refill takes less than 2 seconds.  Psychiatric: His behavior is normal. Judgment and thought content normal. His mood appears anxious. His speech is tangential.  Nursing note and vitals reviewed.    ED Treatments / Results  Labs (all labs ordered are listed, but only abnormal results are displayed) Labs Reviewed  URINALYSIS, ROUTINE W REFLEX MICROSCOPIC - Abnormal; Notable for the following:       Result Value   APPearance CLOUDY (*)    Specific Gravity, Urine 1.003 (*)    Hgb urine dipstick SMALL (*)    Nitrite POSITIVE (*)    Leukocytes, UA MODERATE (*)    All other components within normal limits  BASIC METABOLIC PANEL - Abnormal; Notable for  the following:    Glucose, Bld 109 (*)    Creatinine, Ser 1.30 (*)    GFR calc non Af Amer 51 (*)    GFR calc Af Amer 59 (*)    All other components within normal limits  RAPID URINE DRUG SCREEN, HOSP PERFORMED - Abnormal; Notable for the following:    Benzodiazepines POSITIVE (*)    All other components within normal limits  HEPATIC FUNCTION PANEL - Abnormal; Notable for the following:    Bilirubin, Direct <0.1 (*)  All other components within normal limits  URINALYSIS, MICROSCOPIC (REFLEX) - Abnormal; Notable for the following:    Bacteria, UA FEW (*)    Squamous Epithelial / LPF 0-5 (*)    All other components within normal limits  URINE CULTURE  CBC  ETHANOL  TROPONIN I  TSH    EKG  EKG Interpretation  Date/Time:  Thursday September 18 2016 16:02:34 EST Ventricular Rate:  94 PR Interval:    QRS Duration: 85 QT Interval:  334 QTC Calculation: 418 R Axis:   -90 Text Interpretation:  Sinus rhythm Right ventricular hypertrophy Inferior infarct, old Baseline wander in lead(s) I II aVR No significant change since last tracing Confirmed by [facility] MD, Renly Roots ([id number]) on 09/18/2016 4:09:55 PM       Radiology Dg Chest 2 View  Result Date: 09/18/2016 CLINICAL DATA:  Altered mental status EXAM: CHEST  2 VIEW COMPARISON:  June 09, 2016 FINDINGS: The heart size and mediastinal contours are within normal limits. There is no focal infiltrate, pulmonary edema, or pleural effusion. The visualized skeletal structures are unremarkable. IMPRESSION: No active cardiopulmonary disease. Electronically Signed   By: Sherian Rein M.D.   On: 09/18/2016 16:36   Ct Head Wo Contrast  Result Date: 09/18/2016 CLINICAL DATA:  Altered mental status. Previous hemorrhage left occipital lobe EXAM: CT HEAD WITHOUT CONTRAST TECHNIQUE: Contiguous axial images were obtained from the base of the skull through the vertex without intravenous contrast. COMPARISON:  Head CT November 26, 2015; brain MRI Jan 11, 2016  FINDINGS: Brain: Moderate diffuse atrophy is stable. There is evidence of prior infarct in the left occipital lobe in the area of previous hemorrhage. No residual hemorrhage is evident in this area. There is currently no acute hemorrhage, mass, or extra-axial fluid collection. No midline shift. There is patchy small vessel disease in the centra semiovale bilaterally. No acute infarct evident. Vascular: No hyperdense vessel. There is calcification in the right carotid siphon region. Skull: Bony calvarium appears intact. Sinuses/Orbits: There is mild mucosal thickening in several anterior ethmoid air cells bilaterally. Other visualized paranasal sinuses are clear. Orbits appear symmetric bilaterally. Note that there has been cataract removal on the left. Other: Visualized mastoid air cells are clear. IMPRESSION: Atrophy. Prior infarct left occipital lobe. There has been resolution of hemorrhage in this area compared to prior studies. No acute hemorrhage currently evident. There is mild periventricular small vessel disease. No acute hemorrhage or acute infarct evident. No mass, midline shift, or extra-axial fluid collection. Mild vascular calcification noted in the right carotid siphon region. Mild ethmoid air cell disease bilaterally. Electronically Signed   By: Bretta Bang III M.D.   On: 09/18/2016 16:27    Procedures Procedures (including critical care time)  Medications Ordered in ED Medications  sodium chloride 0.9 % bolus 1,000 mL (not administered)    And  0.9 %  sodium chloride infusion (not administered)  ciprofloxacin (CIPRO) tablet 500 mg (not administered)     Initial Impression / Assessment and Plan / ED Course  I have reviewed the triage vital signs and the nursing notes.  Pertinent labs & imaging results that were available during my care of the patient were reviewed by me and considered in my medical decision making (see chart for details).  Clinical Course    Pt will be  treated for his UTI with cipro.  UA will be sent for culture as it's from an indwelling catheter.    The pt is encouraged to speak to his doctor  again regarding valium QID, as I can't change the rx.   His life has changed significantly since his stroke, and he is very anxious regarding his new life circumstances.    Pt is stable for d/c.  Final Clinical Impressions(s) / ED Diagnoses   Final diagnoses:  Urinary tract infection associated with indwelling urethral catheter, initial encounter (HCC)  Anxiety    New Prescriptions New Prescriptions   CIPROFLOXACIN (CIPRO) 500 MG TABLET    Take 1 tablet (500 mg total) by mouth 2 (two) times daily.     Jacalyn LefevreJulie Jacaria Colburn, MD 09/18/16 [id number]

## 2016-09-18 NOTE — Telephone Encounter (Signed)
Spoke to patient and his wife. They agreed to go to the ER [facility] [place]([facility]).  Message routed to PCP for FYI.

## 2016-09-19 ENCOUNTER — Other Ambulatory Visit: Payer: Self-pay | Admitting: Pharmacist

## 2016-09-19 LAB — TSH: TSH: 1.311 u[IU]/mL (ref 0.350–4.500)

## 2016-09-19 NOTE — Patient Outreach (Signed)
David Duke was referred to pharmacy for questions about his medication, particularly his Valium. Receive a voicemail from patient's wife, Burt EkSabrina Raneri, requesting that I call the patient. Called and spoke with patient. HIPAA identifiers verified and verbal consent received.  Mr. Molli BarrowsSchumaker reports that he is having difficulty with hearing and asks that/gives permission that I speak with his wife regarding his medications. Mrs. Molli BarrowsSchumaker reports that the patient has dementia and is having particular difficulty with anxiety overnight. Reports that the patient is quite concerned about his Valium dosing having been switched from four times/day to three times daily. Let Mrs. Detjen know that I do not prescribe medication and that I cannot speak to decisions made by the patient's provider. However, counsel Mrs. Budzik regarding risks with use of Valium in patient's over the age of 80, particularly given its long half life and risks of the patient being sedated or falling. Mrs. Daphane ShepherdSchumaker verbalizes understanding and expresses appreciation for this information.  Mrs. Molli BarrowsSchumaker confirms that the patient is taking his ciprofloxacin twice daily as prescribed yesterday from the emergency department. Counsel that the medication should be taken with food, but not dairy; to be taken separately from his multivitamin and to complete the full course unless directed otherwise by his physicians. Mrs. Molli BarrowsSchumaker expresses appreciation for this information.  Mrs. Molli BarrowsSchumaker reports that she and the patient have no further medication questions at this time. Confirm that she has my phone number.  Will close pharmacy episode at this time.  Duanne MoronElisabeth Tabbitha Janvrin, PharmD, [facility] LLCBCACP Clinical Pharmacist [facility] Care Management [phone]

## 2016-09-20 LAB — URINE CULTURE

## 2016-09-23 ENCOUNTER — Encounter: Payer: Self-pay | Admitting: Medical

## 2016-09-23 ENCOUNTER — Ambulatory Visit (INDEPENDENT_AMBULATORY_CARE_PROVIDER_SITE_OTHER): Payer: Medicare Other | Admitting: Medical

## 2016-09-23 VITALS — BP 0/0 | HR 105 | Ht 68.0 in | Wt 142.4 lb

## 2016-09-23 DIAGNOSIS — N39498 Other specified urinary incontinence: Secondary | ICD-10-CM | POA: Diagnosis not present

## 2016-09-23 DIAGNOSIS — F411 Generalized anxiety disorder: Secondary | ICD-10-CM | POA: Diagnosis not present

## 2016-09-23 DIAGNOSIS — N390 Urinary tract infection, site not specified: Secondary | ICD-10-CM | POA: Diagnosis not present

## 2016-09-23 MED ORDER — ALBUTEROL SULFATE HFA 108 (90 BASE) MCG/ACT IN AERS: 2.0000 | INHALATION_SPRAY | RESPIRATORY_TRACT | 1 refills | Status: DC | PRN

## 2016-09-23 MED ORDER — DIAZEPAM 5 MG PO TABS: ORAL_TABLET | ORAL | 0 refills | Status: DC

## 2016-09-23 MED FILL — PROAIR HFA 90 MCG INHALER: 108 (90 BAS | 25 days supply | Qty: 9 | Fill #0

## 2016-09-23 NOTE — Patient Instructions (Addendum)
Sever anxiety persisting. You will be out of valium again early. I have talked with my supervisor and she does not want you to take it 4 times daily(rather she instructs 3 times daily use). I will prescribe you medication early and you can fill in 2 days. But you need to work on finding a new clinic. I quick [facility] or other [facility].(I apologize but this appears to be best in light that I can't write at 4 times daily.)  I will refer you to urologist to address recent uti.   I want you to still call the psychiatrist office and start referral. Call me when you make the call to psychiatrist and then I will try to push through that referral.  Follow up as needed  Note pt has violated contract twice now. I have been giving him early refill of valium as I am concerned abrupt stopping of valium would result in seizure. But at same time supervisor does not feel qid is appropiate. I notified her how pt is using and that I will refill early to avoid seizure and notified supervising MD that will dismiss patient. Supervising MD is in agreement.

## 2016-09-23 NOTE — Progress Notes (Signed)
Subjective:    Patient ID: David BarlowDavid R Harr, male    DOB: 02/16/1935, 82 y.o.   MRN: [id number]  HPI  Pt in for follow up.   He had uti in the ED. They treated him with Cipro. Has indwelling catheter. Pt is currently on antibiotic. Pt has urologist. He has not been able to control his urination since the stroke. Some acute confusion caused work up per wife. Dr. Pete GlatterStoneKing is the urologist. Since cipro wife states he is doing better.  Also he has persistent anxiety. See last note. He continues to feel need to use valium qid. He is adamant about this. I have reviewed this request various times with supervising MD who does not want him to use valium qid but rather tid. Pt is aware of this. Pt used more tabs around new years day. This is second time he running out early.  He has bout 2 days left of medications.  Pt needs refill as he will run out soon. He was made aware that if he can't use tid that he would need to find a practice that would write like he wants.  I referred pt to pschychatrist. Gave specialist information sheet. They(pt or wife) have not made call. Instuction given last visit was not followed.    Review of Systems  Constitutional: Negative for chills, fatigue and fever.  Respiratory: Negative for cough, chest tightness, shortness of breath and wheezing.   Cardiovascular: Negative for chest pain and palpitations.  Gastrointestinal: Positive for abdominal pain.  Musculoskeletal: Negative for back pain and myalgias.  Neurological: Negative for dizziness and headaches.  Hematological: Negative for adenopathy. Does not bruise/bleed easily.  Psychiatric/Behavioral: Negative for confusion and decreased concentration. The patient is nervous/anxious.     Past Medical History:  Diagnosis Date  . AKI (acute kidney injury) (HCC)   . Anxiety   . Blind right eye   . BPH (benign prostatic hypertrophy) with urinary retention   . COPD (chronic obstructive pulmonary disease) (HCC)     . Depression   . High cholesterol   . Macular degeneration of both eyes   . Renal disorder   . Shortness of breath dyspnea   . Stroke [facility](HCC)      Social History   Social History  . Marital status: Married    Spouse name: N/A  . Number of children: N/A  . Years of education: N/A   Occupational History  . Not on file.   Social History Main Topics  . Smoking status: Current Every Day Smoker    Packs/day: 0.50    Years: 60.00    Types: Cigarettes  . Smokeless tobacco: Never Used  . Alcohol use No  . Drug use: No  . Sexual activity: Not on file   Other Topics Concern  . Not on file   Social History Narrative  . No narrative on file    Past Surgical History:  Procedure Laterality Date  . NO PAST SURGERIES      Family History  Problem Relation Age of Onset  . Colon cancer Mother   . Cerebral aneurysm Father     Allergies  Allergen Reactions  . Penicillins Hives, Diarrhea and Nausea And Vomiting    Current Outpatient Prescriptions on File Prior to Visit  Medication Sig Dispense Refill  . albuterol (PROVENTIL HFA;VENTOLIN HFA) 108 (90 Base) MCG/ACT inhaler Inhale 2 puffs into the lungs every 4 (four) hours as needed for wheezing or shortness of breath (or cough). 1 Inhaler  1  . atorvastatin (LIPITOR) 20 MG tablet Take 1 tablet (20 mg total) by mouth daily at 6 PM. 30 tablet 2  . ciprofloxacin (CIPRO) 500 MG tablet Take 1 tablet (500 mg total) by mouth 2 (two) times daily. 28 tablet 0  . diazepam (VALIUM) 5 MG tablet 1 tab po q 8 hours as needed severe anxiety 90 tablet 0  . FINASTERIDE PO Take 5 mg by mouth daily.     . fluticasone (FLONASE) 50 MCG/ACT nasal spray Place 1 spray into both nostrils daily. (Patient taking differently: Place 1 spray into both nostrils daily as needed for allergies or rhinitis. )  2  . traZODone (DESYREL) 50 MG tablet Take 0.5-1 tablets (25-50 mg total) by mouth at bedtime as needed for sleep. (Patient not taking: Reported on 09/23/2016)  30 tablet 1   No current facility-administered medications on file prior to visit.     BP (!) 0/0 (BP Location: Left Arm, Patient Position: Sitting, Cuff Size: Normal)   Pulse (!) 105   Ht 5\' 8"  (1.727 m)   Wt 142 lb 6 oz (64.6 kg)   BMI 21.65 kg/m       Objective:   Physical Exam  General Mental Status- Alert. General Appearance- Not in acute distress. Chronic anxiety.(today appears so as he even thinks about ides of running out of valium)    Chest and Lung Exam Auscultation: Breath Sounds:-Normal.  Cardiovascular Auscultation:Rythm- Regular. Murmurs & Other Heart Sounds:Auscultation of the heart reveals- No Murmurs.  Abdomen Inspection:-Inspeection Normal. Palpation/Percussion:Note:No mass. Palpation and Percussion of the abdomen reveal- Non Tender, Non Distended + BS, no rebound or guarding.  Neurologic Cranial Nerve exam:- CN III-XII intact(No nystagmus), symmetric smile. Strength:- 5/5 equal and symmetric strength both upper and lower extremities.      Assessment & Plan:  Severe anxiety persisting. You will be  out of valium  again early. I have talked with my supervisor and she does not want you to take it 4 times daily(rather she instructs 3 times daily use). I will prescribe you medication early and you can fill in 2 days. But you need to work on finding a new clinic. I quick other option may be [facility].(I apologize but this appears to be best in light that I can't write at 4 times daily.)  I will refer you to urologist.   I want you to still call the psychiatrist office and start referral. Call me when you make the call to psychiatrist and then I will try to push through that referral.  Follow up as needed  Note pt has violated contract twice now. I have been giving him early refill of valium as I am concerned abrupt stopping of valium would result in seizure. But at same time supervisor does not feel qid is appropiate. I notified her how pt is using  and that I will refill early to avoid seizure and notified supervising MD that will dismiss patient. Supervising MD is in agreement.  Printing dismissal letter today and will sign tomorrow. Bane Hagy, Ramon Dredge, PA-C

## 2016-09-23 NOTE — Progress Notes (Signed)
Pre visit review using our clinic review tool, if applicable. No additional management support is needed unless otherwise documented below in the visit note/SLS  

## 2016-09-25 ENCOUNTER — Encounter (HOSPITAL_BASED_OUTPATIENT_CLINIC_OR_DEPARTMENT_OTHER): Payer: Self-pay

## 2016-09-25 ENCOUNTER — Emergency Department (HOSPITAL_BASED_OUTPATIENT_CLINIC_OR_DEPARTMENT_OTHER)
Admission: EM | Admit: 2016-09-25 | Discharge: 2016-09-26 | Disposition: A | Discharge status: Home / Self Care | Payer: Medicare Other | Attending: Emergency Medicine | Admitting: Emergency Medicine

## 2016-09-25 DIAGNOSIS — F17210 Nicotine dependence, cigarettes, uncomplicated: Secondary | ICD-10-CM | POA: Diagnosis not present

## 2016-09-25 DIAGNOSIS — Z8673 Personal history of transient ischemic attack (TIA), and cerebral infarction without residual deficits: Secondary | ICD-10-CM | POA: Insufficient documentation

## 2016-09-25 DIAGNOSIS — J441 Chronic obstructive pulmonary disease with (acute) exacerbation: Secondary | ICD-10-CM | POA: Insufficient documentation

## 2016-09-25 DIAGNOSIS — F419 Anxiety disorder, unspecified: Secondary | ICD-10-CM | POA: Diagnosis not present

## 2016-09-25 MED ORDER — DIAZEPAM 5 MG PO TABS
5.0000 mg | ORAL_TABLET | Freq: Once | ORAL | Status: AC
  Administered 2016-09-25: 5 mg via ORAL
  Filled 2016-09-25: qty 1

## 2016-09-25 MED ORDER — IPRATROPIUM-ALBUTEROL 0.5-2.5 (3) MG/3ML IN SOLN
3.0000 mL | Freq: Once | RESPIRATORY_TRACT | Status: AC
  Administered 2016-09-25: 3 mL via RESPIRATORY_TRACT
  Filled 2016-09-25: qty 3

## 2016-09-25 MED ORDER — DIAZEPAM 5 MG PO TABS: 5.0000 mg | ORAL_TABLET | Freq: Four times a day (QID) | ORAL | 0 refills | Status: DC | PRN

## 2016-09-25 NOTE — ED Notes (Signed)
Pt is asking about a prescription for his meds, informed pt and wife that the pharmacy is not going to fill a prescription from the ED if they did not fill a prescription from his PCP since it is a controlled substance.  Pt's wife is yelling at nurse at this time, informed pt where his call bell is and let DR. Molpus know they are requesting a prescription.

## 2016-09-25 NOTE — ED Notes (Signed)
Per EMS pt c/o out of diazepam x2days, pt states feeling anxiety since his stroke

## 2016-09-25 NOTE — ED Notes (Signed)
Pt and wife verbalize understanding of dc instructions.  Nurse will call [facility] to see if they will fill the written prescription for them.  Pt and wife deny any further needs at this time.

## 2016-09-25 NOTE — ED Provider Notes (Signed)
[facility] Provider Note: David Duke David Lacey, MD, FACEP  By signing my name below, I, David Duke, attest that this documentation has been prepared under the direction and in the presence of David LibraJohn Deetra Booton, MD. Electronically Signed: Doreatha MartinEva Duke, ED Scribe. 09/25/16. 11:25 PM.    CSN: [id number] MRN: [id number] ARRIVAL: 09/25/16 at 2225 ROOM: MH10/MH10   CHIEF COMPLAINT  Anxiety (out of meds)   HISTORY OF PRESENT ILLNESS  David BarlowDavid R Duke is a 82 y.o. male with h/o stroke 11/22/15, COPD, dementia who presents to the Emergency Department requesting refill of Valium, with last dose taken ~noon today. Wife of pt states that pt is prescribed the medication for anxiety and agitation following his stroke. He was initially prescribed q6h 5 mg Valium, which controlled his agitation and anxiety very well, but it was recently decreased to q8h. Wife states that the lowered dose has worsened his anxiety. Per wife, pts neurological deficits since his stroke have been improving with therapy. He also complains of increased difficulty breathing from baseline this evening. Pt states he used ProAir at home with inadequate relief of symptoms. Patient states he had mild abdominal pain en route in EMS, but denies any currently. Patient denies fever, chills, CP, nausea, vomiting or diarrhea.    Past Medical History:  Diagnosis Date  . AKI (acute kidney injury) (HCC)   . Anxiety   . Blind right eye   . BPH (benign prostatic hypertrophy) with urinary retention   . COPD (chronic obstructive pulmonary disease) (HCC)   . Depression   . High cholesterol   . Macular degeneration of both eyes   . Renal disorder   . Shortness of breath dyspnea   . Stroke [facility](HCC)     Past Surgical History:  Procedure Laterality Date  . NO PAST SURGERIES      Family History  Problem Relation Age of Onset  . Colon cancer Mother   . Cerebral aneurysm Father     Social History  Substance Use Topics  . Smoking  status: Current Every Day Smoker    Packs/day: 0.50    Years: 60.00    Types: Cigarettes  . Smokeless tobacco: Never Used  . Alcohol use No    Prior to Admission medications   Medication Sig Start Date End Date Taking? Authorizing Provider  albuterol (PROVENTIL HFA;VENTOLIN HFA) 108 (90 Base) MCG/ACT inhaler Inhale 2 puffs into the lungs every 4 (four) hours as needed for wheezing or shortness of breath (or cough). 09/23/16   Ramon DredgeEdward Saguier, PA-C  atorvastatin (LIPITOR) 20 MG tablet Take 1 tablet (20 mg total) by mouth daily at 6 PM. 09/03/16   Esperanza RichtersEdward Saguier, PA-C  ciprofloxacin (CIPRO) 500 MG tablet Take 1 tablet (500 mg total) by mouth 2 (two) times daily. 09/18/16   Jacalyn LefevreJulie Haviland, MD  diazepam (VALIUM) 5 MG tablet Take 1 tablet (5 mg total) by mouth every 6 (six) hours as needed for anxiety. 09/25/16   Jwan Hornbaker, MD  FINASTERIDE PO Take 5 mg by mouth daily.     Historical Provider, MD  fluticasone (FLONASE) 50 MCG/ACT nasal spray Place 1 spray into both nostrils daily. Patient taking differently: Place 1 spray into both nostrils daily as needed for allergies or rhinitis.  12/11/15   Jacquelynn CreePamela S Love, PA-C    Allergies Penicillins   REVIEW OF SYSTEMS  Negative except as noted here or in the History of Present Illness.   PHYSICAL EXAMINATION  Initial Vital Signs Blood pressure 143/93, pulse 102,  temperature 98 F (36.7 C), temperature source Oral, resp. rate 22, SpO2 100 %.  Examination General: Well-developed, well-nourished male in no acute distress; appearance consistent with age of record HENT: normocephalic; atraumatic; decreased hearing right ear  Eyes: extraocular muscles intact; left pupil round and reactive to light, lens implant present; right pupil sluggish with cataract  Neck: supple Heart: regular rate and rhythm Lungs: Expiratory wheezes bilaterally  Abdomen: soft; nondistended; nontender; no masses or hepatosplenomegaly; bowel sounds present GU: foley catheter in  place with leg bag  Extremities: No deformity; full range of motion; pulses normal Neurologic: Awake, alert; motor function grossly intact in all extremities; no facial droop Skin: Warm and dry Psychiatric: Mildly anxiety   RESULTS  Summary of this visit's results, reviewed by myself:   EKG Interpretation  Date/Time:    Ventricular Rate:    PR Interval:    QRS Duration:   QT Interval:    QTC Calculation:   R Axis:     Text Interpretation:        Laboratory Studies: No results found for this or any previous visit (from the past 24 hour(s)). Imaging Studies: No results found.  ED COURSE  Nursing notes and initial vitals signs, including pulse oximetry, reviewed.  Vitals:   09/25/16 2226 09/25/16 2317  BP: 143/93   Pulse: 102   Resp: 22   Temp: 98 F (36.7 C)   TempSrc: Oral   SpO2: 100% 98%   11:45 PM Lungs clear after DuoNeb treatment.  PROCEDURES    ED DIAGNOSES     ICD-9-CM ICD-10-CM   1. Anxiety 300.00 F41.9   2. COPD with acute exacerbation (HCC) 491.21 J44.1     I personally performed the services described in this documentation, which was scribed in my presence. The recorded information has been reviewed and is accurate.    David Libra, MD 09/25/16 [phone]

## 2016-09-26 ENCOUNTER — Other Ambulatory Visit: Payer: Self-pay | Admitting: *Deleted

## 2016-09-26 ENCOUNTER — Telehealth: Payer: Self-pay | Admitting: Medical

## 2016-09-26 NOTE — ED Notes (Signed)
[facility] states that they cannot fill prescription early

## 2016-09-26 NOTE — Telephone Encounter (Signed)
Patient Name: David ShoalsDAVID Duke DOB: 02/16/1935 Initial Comment Caller says her husband is almost out of his medication and the pharmacy is telling them that they cant refill it until next week. The Dr. said they were aloud to get it on the 18th. Symptoms: anxiety and agitation. He has a history of a brain bleed and a stroke. Nurse Assessment Nurse: Yetta BarreJones, RN, Miranda Date/Time (Eastern Time): 09/26/2016 9:35:45 AM Confirm and document reason for call. If symptomatic, describe symptoms. ---Caller states needing to get Valium but pharmacy will not release. Does the patient have any new or worsening symptoms? ---No Please document clinical information provided and list any resource used. ---Review in Epic appears there are problems with the pt following contract. Told caller she would need to contact the office after 10 am when they open. Guidelines Guideline Title Affirmed Question Affirmed Notes Final Disposition User Clinical Call Yetta BarreJones, RN, [employer]anda

## 2016-09-26 NOTE — Telephone Encounter (Signed)
Called patients caregiver who states they were only able to get 1 pill (Valium) from pharmacy yesterday. Called pharmacy who states patient can pick up Rx today. Caregiver notified. States they need to have Rx changed to 3 times daily. Advised that E Saguier would not be able to write another Rx for Valium since they got one yesterday from another provider. Patients caregiver states she will call back when Rx is completed. Patient is scheduled for discharge from this practice for non-compliance per E. Saguier PA-C.

## 2016-09-26 NOTE — Telephone Encounter (Signed)
Pt went to the ED for more valium. But had active prescription that he could have filled on 18th. Will send out dismissal letter after giving to office manager to review.

## 2016-09-26 NOTE — Patient Outreach (Signed)
[facility] Care Management  09/26/2016  David BarlowDavid R Prieto 09/20/1934 [id number]   CSW made a fourth attempt to try and contact patient today by phone, without success.  A HIPAA compliant message was left on voicemail for patient, encouraging patient to contact CSW if he is interested in receiving social work services and/or resources.  CSW will perform a case closure on patient, due to inability to establish initial contact with patient, despite required number of phone attempts made and outreach letter mailed to patient's home, allowing 10 business days for a response if interested in receiving services.  CSW will notify patient's Telephonic RNCM with [facility] Care Management, [name] of CSW's plans to close patient's case.  CSW will fax an update to patient's Primary Care Physician, Dr. Esperanza RichtersEdward Saguier to ensure that they are aware of CSW's involvement with patient's plan of care.  CSW will submit a case closure request to [name], Care Management Assistant with [facility], in the form of an [employer] message.  CSW will ensure that Mrs. [name] is aware of [name]'s, RNCM with [facility], continued involvement with patient's care. [name], BSW, MSW, LCSW  Licensed Restaurant manager, fast foodClinical Social Worker  [facility] Care Management [facility]  Mailing [place], [place], [name] [address] Physical Address-[address], [place], [name] [address] Toll Free Main # [phone] Fax # [phone] Cell # [phone]  Fax # [phone]  [email or web address]

## 2016-09-29 ENCOUNTER — Encounter (HOSPITAL_COMMUNITY): Payer: Self-pay | Admitting: Emergency Medicine

## 2016-09-29 ENCOUNTER — Emergency Department (HOSPITAL_COMMUNITY): Payer: Medicare Other

## 2016-09-29 ENCOUNTER — Inpatient Hospital Stay (HOSPITAL_COMMUNITY)
Admission: EM | Admit: 2016-09-29 | Discharge: 2016-10-01 | DRG: 392 | Disposition: A | Discharge status: Home / Self Care | Payer: Medicare Other | Attending: Family Medicine | Admitting: Family Medicine

## 2016-09-29 DIAGNOSIS — J449 Chronic obstructive pulmonary disease, unspecified: Secondary | ICD-10-CM | POA: Diagnosis present

## 2016-09-29 DIAGNOSIS — N183 Chronic kidney disease, stage 3 (moderate): Secondary | ICD-10-CM | POA: Diagnosis not present

## 2016-09-29 DIAGNOSIS — F329 Major depressive disorder, single episode, unspecified: Secondary | ICD-10-CM | POA: Diagnosis not present

## 2016-09-29 DIAGNOSIS — I69319 Unspecified symptoms and signs involving cognitive functions following cerebral infarction: Secondary | ICD-10-CM | POA: Diagnosis not present

## 2016-09-29 DIAGNOSIS — K529 Noninfective gastroenteritis and colitis, unspecified: Secondary | ICD-10-CM | POA: Diagnosis present

## 2016-09-29 DIAGNOSIS — F4323 Adjustment disorder with mixed anxiety and depressed mood: Secondary | ICD-10-CM | POA: Diagnosis present

## 2016-09-29 DIAGNOSIS — N390 Urinary tract infection, site not specified: Secondary | ICD-10-CM | POA: Diagnosis present

## 2016-09-29 DIAGNOSIS — N401 Enlarged prostate with lower urinary tract symptoms: Secondary | ICD-10-CM | POA: Diagnosis not present

## 2016-09-29 DIAGNOSIS — R197 Diarrhea, unspecified: Secondary | ICD-10-CM

## 2016-09-29 DIAGNOSIS — E785 Hyperlipidemia, unspecified: Secondary | ICD-10-CM | POA: Diagnosis not present

## 2016-09-29 DIAGNOSIS — Z880 Allergy status to penicillin: Secondary | ICD-10-CM | POA: Diagnosis not present

## 2016-09-29 DIAGNOSIS — R52 Pain, unspecified: Secondary | ICD-10-CM

## 2016-09-29 DIAGNOSIS — H5461 Unqualified visual loss, right eye, normal vision left eye: Secondary | ICD-10-CM | POA: Diagnosis not present

## 2016-09-29 DIAGNOSIS — Z79899 Other long term (current) drug therapy: Secondary | ICD-10-CM | POA: Diagnosis not present

## 2016-09-29 DIAGNOSIS — F17210 Nicotine dependence, cigarettes, uncomplicated: Secondary | ICD-10-CM | POA: Diagnosis present

## 2016-09-29 DIAGNOSIS — F0390 Unspecified dementia without behavioral disturbance: Secondary | ICD-10-CM | POA: Diagnosis not present

## 2016-09-29 DIAGNOSIS — Z720 Tobacco use: Secondary | ICD-10-CM | POA: Diagnosis present

## 2016-09-29 DIAGNOSIS — F419 Anxiety disorder, unspecified: Secondary | ICD-10-CM | POA: Diagnosis present

## 2016-09-29 DIAGNOSIS — R338 Other retention of urine: Secondary | ICD-10-CM | POA: Diagnosis not present

## 2016-09-29 DIAGNOSIS — R112 Nausea with vomiting, unspecified: Secondary | ICD-10-CM

## 2016-09-29 DIAGNOSIS — I69393 Ataxia following cerebral infarction: Secondary | ICD-10-CM | POA: Diagnosis not present

## 2016-09-29 DIAGNOSIS — N400 Enlarged prostate without lower urinary tract symptoms: Secondary | ICD-10-CM | POA: Diagnosis present

## 2016-09-29 LAB — CBC WITH DIFFERENTIAL/PLATELET
Basophils Absolute: 0 10*3/uL (ref 0.0–0.1)
Basophils Relative: 0 %
Eosinophils Absolute: 0 10*3/uL (ref 0.0–0.7)
Eosinophils Relative: 0 %
HCT: 44.3 % (ref 39.0–52.0)
Hemoglobin: 15 g/dL (ref 13.0–17.0)
Lymphocytes Relative: 4 %
Lymphs Abs: 0.5 10*3/uL — ABNORMAL LOW (ref 0.7–4.0)
MCH: 31.2 pg (ref 26.0–34.0)
MCHC: 33.9 g/dL (ref 30.0–36.0)
MCV: 92.1 fL (ref 78.0–100.0)
Monocytes Absolute: 0.5 10*3/uL (ref 0.1–1.0)
Monocytes Relative: 3 %
Neutro Abs: 14 10*3/uL — ABNORMAL HIGH (ref 1.7–7.7)
Neutrophils Relative %: 93 %
Platelets: 207 10*3/uL (ref 150–400)
RBC: 4.81 MIL/uL (ref 4.22–5.81)
RDW: 13.6 % (ref 11.5–15.5)
WBC: 15.1 10*3/uL — ABNORMAL HIGH (ref 4.0–10.5)

## 2016-09-29 LAB — BASIC METABOLIC PANEL
Anion gap: 10 (ref 5–15)
BUN: 22 mg/dL — ABNORMAL HIGH (ref 6–20)
CO2: 24 mmol/L (ref 22–32)
Calcium: 8.3 mg/dL — ABNORMAL LOW (ref 8.9–10.3)
Chloride: 105 mmol/L (ref 101–111)
Creatinine, Ser: 1.4 mg/dL — ABNORMAL HIGH (ref 0.61–1.24)
GFR calc Af Amer: 54 mL/min — ABNORMAL LOW (ref >60)
GFR calc non Af Amer: 46 mL/min — ABNORMAL LOW (ref >60)
Glucose, Bld: 116 mg/dL — ABNORMAL HIGH (ref 65–99)
Potassium: 3.9 mmol/L (ref 3.5–5.1)
Sodium: 139 mmol/L (ref 135–145)

## 2016-09-29 LAB — CBG MONITORING, ED: Glucose-Capillary: 109 mg/dL — ABNORMAL HIGH (ref 65–99)

## 2016-09-29 MED ORDER — ONDANSETRON HCL 4 MG/2ML IJ SOLN
4.0000 mg | Freq: Once | INTRAMUSCULAR | Status: AC
  Administered 2016-09-29: 4 mg via INTRAVENOUS
  Filled 2016-09-29: qty 2

## 2016-09-29 MED ORDER — IOPAMIDOL (ISOVUE-300) INJECTION 61%
INTRAVENOUS | Status: AC
  Administered 2016-09-29: 75 mL via INTRAVENOUS
  Filled 2016-09-29: qty 75

## 2016-09-29 MED ORDER — SODIUM CHLORIDE 0.9 % IV BOLUS (SEPSIS)
1000.0000 mL | Freq: Once | INTRAVENOUS | Status: AC
  Administered 2016-09-29: 1000 mL via INTRAVENOUS

## 2016-09-29 NOTE — ED Provider Notes (Signed)
[facility]-EMERGENCY DEPT Provider Note   CSN: [id number] Arrival date & time: 09/29/16  1800     History   Chief Complaint Chief Complaint  Patient presents with  . Nausea  . Vomiting    HPI David Duke is a 82 y.o. male.  HPI   82 year old male presents today with couplets of nausea vomiting and diarrhea. Patient reports that he woke up this morning with a fever of 100.1. He reports that he was "not feeling well" was able to tolerate by mouth and had no specific complaints. He reports onset of vomiting this evening with associated diarrhea. Patient reports negative abdominal discomfort, notes that he feels as if his abdomen is swelling and then improved with diarrhea. Denies any blood in his vomit or diarrhea. He reports he is taking Cipro for urinary tract infection that was diagnosed incidentally with urinalysis.  Wife is at bedside reports that she gave him Tylenol at 4 PM today as his fever returned from this morning.   Past Medical History:  Diagnosis Date  . AKI (acute kidney injury) (HCC)   . Anxiety   . Blind right eye   . BPH (benign prostatic hypertrophy) with urinary retention   . COPD (chronic obstructive pulmonary disease) (HCC)   . Depression   . High cholesterol   . Macular degeneration of both eyes   . Renal disorder   . Shortness of breath dyspnea   . Stroke [facility])     Patient Active Problem List   Diagnosis Date Noted  . History of stroke in prior 3 months 01/22/2016  . Chronic tension-type headache, not intractable   . Acute frontal sinusitis   . 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 Gait disturbance, post-stroke   . Ataxia, late effect of cerebrovascular disease   . BPH (benign prostatic hyperplasia)   . Urinary retention   . Adjustment disorder with mixed anxiety and depressed mood   . Chronic obstructive pulmonary disease (HCC)   . Cephalalgia   . AKI (acute kidney injury) (HCC)   . Macular degeneration   .  Prediabetes   . Hypokalemia   . Absolute anemia   . Aphasia   . Acute encephalopathy 11/25/2015  . Hyponatremia 11/25/2015  . Acute kidney injury (HCC) 11/25/2015  . Depression   . Anxiety   . COPD (chronic obstructive pulmonary disease) (HCC)   . ICH (intracerebral hemorrhage) (HCC) 11/22/2015    Past Surgical History:  Procedure Laterality Date  . NO PAST SURGERIES       Home Medications    Prior to Admission medications   Medication Sig Start Date End Date Taking? Authorizing Provider  albuterol (PROVENTIL HFA;VENTOLIN HFA) 108 (90 Base) MCG/ACT inhaler Inhale 2 puffs into the lungs every 4 (four) hours as needed for wheezing or shortness of breath (or cough). 09/23/16  Yes [name], PA-C  atorvastatin (LIPITOR) 20 MG tablet Take 1 tablet (20 mg total) by mouth daily at 6 PM. 09/03/16  Yes Esperanza Richters, PA-C  ciprofloxacin (CIPRO) 500 MG tablet Take 1 tablet (500 mg total) by mouth 2 (two) times daily. 09/18/16  Yes Jacalyn Lefevre, MD  diazepam (VALIUM) 5 MG tablet Take 1 tablet (5 mg total) by mouth every 6 (six) hours as needed for anxiety. 09/25/16  Yes John Molpus, MD  finasteride (PROSCAR) 5 MG tablet Take 5 mg by mouth daily. 09/22/16  Yes Historical Provider, MD  fluticasone (FLONASE) 50 MCG/ACT nasal spray Place 1 spray into  both nostrils daily. Patient taking differently: Place 1 spray into both nostrils daily as needed for allergies or rhinitis.  12/11/15  Yes Evlyn KannerPamela S Love, PA-C  Multiple Vitamin (MULTIVITAMIN WITH MINERALS) TABS tablet Take 1 tablet by mouth daily.   Yes Historical Provider, MD    Family History Family History  Problem Relation Age of Onset  . Colon cancer Mother   . Cerebral aneurysm Father     Social History Social History  Substance Use Topics  . Smoking status: Current Every Day Smoker    Packs/day: 0.50    Years: 60.00    Types: Cigarettes  . Smokeless tobacco: Never Used  . Alcohol use No     Allergies    Penicillins   Review of Systems Review of Systems  All other systems reviewed and are negative.   Physical Exam Updated Vital Signs BP 118/68 (BP Location: Left Arm)   Pulse 90   Temp 98.1 F (36.7 C) (Oral)   Resp 26   Ht 5\' 8"  (1.727 m)   Wt 64.4 kg   SpO2 97%   BMI 21.59 kg/m   Physical Exam  Constitutional: He is oriented to person, place, and time. He appears well-developed and well-nourished.  HENT:  Head: Normocephalic and atraumatic.  Eyes: Conjunctivae are normal. Pupils are equal, round, and reactive to light. Right eye exhibits no discharge. Left eye exhibits no discharge. No scleral icterus.  Neck: Normal range of motion. No JVD present. No tracheal deviation present.  Cardiovascular: Normal rate and regular rhythm.   Pulmonary/Chest: Effort normal and breath sounds normal. No stridor. No respiratory distress. He has no wheezes. He has no rales. He exhibits no tenderness.  Abdominal: Soft. He exhibits no distension and no mass. There is tenderness. There is no guarding.  RUQ/LUQ TTP  Musculoskeletal: Normal range of motion.  Neurological: He is alert and oriented to person, place, and time. Coordination normal.  Skin: Skin is warm.  Psychiatric: He has a normal mood and affect. His behavior is normal. Judgment and thought content normal.  Nursing note and vitals reviewed.   ED Treatments / Results  Labs (all labs ordered are listed, but only abnormal results are displayed) Labs Reviewed  CBC WITH DIFFERENTIAL/PLATELET - Abnormal; Notable for the following:       Result Value   WBC 15.1 (*)    Neutro Abs 14.0 (*)    Lymphs Abs 0.5 (*)    All other components within normal limits  BASIC METABOLIC PANEL - Abnormal; Notable for the following:    Glucose, Bld 116 (*)    BUN 22 (*)    Creatinine, Ser 1.40 (*)    Calcium 8.3 (*)    GFR calc non Af Amer 46 (*)    GFR calc Af Amer 54 (*)    All other components within normal limits  CBG MONITORING, ED -  Abnormal; Notable for the following:    Glucose-Capillary 109 (*)    All other components within normal limits  C DIFFICILE QUICK SCREEN W PCR REFLEX    EKG  EKG Interpretation None       Radiology Dg Chest 2 View  Result Date: 09/29/2016 CLINICAL DATA:  Acute onset of nausea, vomiting and diarrhea. Urinary tract infection. Cough. Initial encounter. EXAM: CHEST  2 VIEW COMPARISON:  Chest radiograph performed 09/18/2016 FINDINGS: The lungs are well-aerated. Mild vascular congestion is noted. There is no evidence of focal opacification, pleural effusion or pneumothorax. The heart is normal in  size; the mediastinal contour is within normal limits. No acute osseous abnormalities are seen. IMPRESSION: Mild vascular congestion noted.  Lungs remain grossly clear. Electronically Signed   By: Roanna Raider M.D.   On: 09/29/2016 20:03    Procedures Procedures (including critical care time)  Medications Ordered in ED Medications  iopamidol (ISOVUE-300) 61 % injection (not administered)  sodium chloride 0.9 % bolus 1,000 mL (1,000 mLs Intravenous New Bag/Given 09/29/16 2028)     Initial Impression / Assessment and Plan / ED Course  I have reviewed the triage vital signs and the nursing notes.  Pertinent labs & imaging results that were available during my care of the patient were reviewed by me and considered in my medical decision making (see chart for details).     Final Clinical Impressions(s) / ED Diagnoses   Final diagnoses:  Abdominal pain, unspecified abdominal location    Labs: CBC, BMP, CBG  Imaging: DG chest 2 view  Consults:  Therapeutics:  Discharge Meds:   Assessment/Plan: 82 year old male presents today with nausea vomiting and diarrhea. Patient with complaints of painful abdomen, he has had diarrhea in the setting of antibiotic use. Patient is afebrile here, reporting fever earlier. Patient with elevated WBC, he will have CT scan here. Pt care signed out at  shift change pending CT results/ disposition.     New Prescriptions New Prescriptions   No medications on file     Eyvonne Mechanic, PA-C 09/29/16 2137    Derwood Kaplan, MD 09/30/16 1551

## 2016-09-29 NOTE — ED Notes (Signed)
Bed: WA08 Expected date:  Expected time:  Means of arrival:  Comments: EMS 

## 2016-09-29 NOTE — ED Triage Notes (Signed)
Patient is from home.  He was recently diagnosed with a UTI and still taking CIPRO at the moment.  Patient had an acute onset of N/V/D x 1 day.  Wife administered Tylenol. (she did not indicate the amount).  Per wife, family stated he was recently diagnosed with dementia.  Initial BP was 105/70 and dropped to 98/69. EMS administered 1 Liter of fluids.    BP: 107/68 HR:94 R:18 98% on room air \\CBG :126

## 2016-09-29 NOTE — ED Notes (Signed)
Pt repeatedly requesting chaplain. Chaplain was called and stated that he is on call and is going to hold off for now unless the patient gets into a "state of crisis".

## 2016-09-30 ENCOUNTER — Ambulatory Visit: Payer: Self-pay

## 2016-09-30 DIAGNOSIS — R112 Nausea with vomiting, unspecified: Secondary | ICD-10-CM | POA: Diagnosis not present

## 2016-09-30 DIAGNOSIS — F4323 Adjustment disorder with mixed anxiety and depressed mood: Secondary | ICD-10-CM | POA: Diagnosis not present

## 2016-09-30 DIAGNOSIS — F0390 Unspecified dementia without behavioral disturbance: Secondary | ICD-10-CM | POA: Diagnosis not present

## 2016-09-30 DIAGNOSIS — E785 Hyperlipidemia, unspecified: Secondary | ICD-10-CM | POA: Diagnosis not present

## 2016-09-30 DIAGNOSIS — N390 Urinary tract infection, site not specified: Secondary | ICD-10-CM | POA: Diagnosis not present

## 2016-09-30 DIAGNOSIS — J449 Chronic obstructive pulmonary disease, unspecified: Secondary | ICD-10-CM | POA: Diagnosis not present

## 2016-09-30 DIAGNOSIS — K529 Noninfective gastroenteritis and colitis, unspecified: Secondary | ICD-10-CM | POA: Diagnosis present

## 2016-09-30 DIAGNOSIS — N401 Enlarged prostate with lower urinary tract symptoms: Secondary | ICD-10-CM | POA: Diagnosis not present

## 2016-09-30 DIAGNOSIS — I69393 Ataxia following cerebral infarction: Secondary | ICD-10-CM | POA: Diagnosis not present

## 2016-09-30 DIAGNOSIS — F329 Major depressive disorder, single episode, unspecified: Secondary | ICD-10-CM | POA: Diagnosis not present

## 2016-09-30 DIAGNOSIS — R338 Other retention of urine: Secondary | ICD-10-CM | POA: Diagnosis not present

## 2016-09-30 DIAGNOSIS — N183 Chronic kidney disease, stage 3 (moderate): Secondary | ICD-10-CM | POA: Diagnosis not present

## 2016-09-30 DIAGNOSIS — R197 Diarrhea, unspecified: Secondary | ICD-10-CM

## 2016-09-30 DIAGNOSIS — F17210 Nicotine dependence, cigarettes, uncomplicated: Secondary | ICD-10-CM | POA: Diagnosis not present

## 2016-09-30 DIAGNOSIS — Z720 Tobacco use: Secondary | ICD-10-CM | POA: Diagnosis present

## 2016-09-30 DIAGNOSIS — H5461 Unqualified visual loss, right eye, normal vision left eye: Secondary | ICD-10-CM | POA: Diagnosis not present

## 2016-09-30 DIAGNOSIS — I69319 Unspecified symptoms and signs involving cognitive functions following cerebral infarction: Secondary | ICD-10-CM | POA: Diagnosis not present

## 2016-09-30 DIAGNOSIS — Z880 Allergy status to penicillin: Secondary | ICD-10-CM | POA: Diagnosis not present

## 2016-09-30 DIAGNOSIS — Z79899 Other long term (current) drug therapy: Secondary | ICD-10-CM | POA: Diagnosis not present

## 2016-09-30 LAB — GLUCOSE, CAPILLARY: Glucose-Capillary: 79 mg/dL (ref 65–99)

## 2016-09-30 LAB — HEPATIC FUNCTION PANEL
ALBUMIN: 3.7 g/dL (ref 3.5–5.0)
ALK PHOS: 71 U/L (ref 38–126)
ALT: 22 U/L (ref 17–63)
AST: 23 U/L (ref 15–41)
BILIRUBIN TOTAL: 1 mg/dL (ref 0.3–1.2)
Bilirubin, Direct: 0.1 mg/dL (ref 0.1–0.5)
Indirect Bilirubin: 0.9 mg/dL (ref 0.3–0.9)
TOTAL PROTEIN: 6.9 g/dL (ref 6.5–8.1)

## 2016-09-30 LAB — C DIFFICILE QUICK SCREEN W PCR REFLEX
C DIFFICLE (CDIFF) ANTIGEN: NEGATIVE
C Diff interpretation: NOT DETECTED
C Diff toxin: NEGATIVE

## 2016-09-30 LAB — URINALYSIS, ROUTINE W REFLEX MICROSCOPIC
BILIRUBIN URINE: NEGATIVE
Glucose, UA: NEGATIVE mg/dL
KETONES UR: NEGATIVE mg/dL
Leukocytes, UA: NEGATIVE
Nitrite: NEGATIVE
PH: 5 (ref 5.0–8.0)
PROTEIN: NEGATIVE mg/dL
SQUAMOUS EPITHELIAL / LPF: NONE SEEN
Specific Gravity, Urine: 1.015 (ref 1.005–1.030)

## 2016-09-30 LAB — PROCALCITONIN: Procalcitonin: 0.4 ng/mL

## 2016-09-30 LAB — LACTIC ACID, PLASMA: Lactic Acid, Venous: 0.9 mmol/L (ref 0.5–1.9)

## 2016-09-30 MED ORDER — PROMETHAZINE HCL 25 MG/ML IJ SOLN
12.5000 mg | Freq: Once | INTRAMUSCULAR | Status: AC
  Administered 2016-09-30: 12.5 mg via INTRAVENOUS
  Filled 2016-09-30: qty 1

## 2016-09-30 MED ORDER — FAMOTIDINE IN NACL 20-0.9 MG/50ML-% IV SOLN
20.0000 mg | Freq: Once | INTRAVENOUS | Status: AC
  Administered 2016-09-30: 20 mg via INTRAVENOUS
  Filled 2016-09-30: qty 50

## 2016-09-30 MED ORDER — ONDANSETRON HCL 4 MG/2ML IJ SOLN: 4.0000 mg | Freq: Three times a day (TID) | INTRAMUSCULAR | Status: DC | PRN

## 2016-09-30 MED ORDER — ZOLPIDEM TARTRATE 5 MG PO TABS: 5.0000 mg | ORAL_TABLET | Freq: Every evening | ORAL | Status: DC | PRN

## 2016-09-30 MED ORDER — NICOTINE 21 MG/24HR TD PT24
21.0000 mg | MEDICATED_PATCH | Freq: Every day | TRANSDERMAL | Status: DC
  Filled 2016-09-30: qty 1

## 2016-09-30 MED ORDER — ATORVASTATIN CALCIUM 20 MG PO TABS
20.0000 mg | ORAL_TABLET | Freq: Every day | ORAL | Status: DC
  Administered 2016-09-30: 20 mg via ORAL
  Filled 2016-09-30: qty 1

## 2016-09-30 MED ORDER — ADULT MULTIVITAMIN W/MINERALS CH
1.0000 | ORAL_TABLET | Freq: Every day | ORAL | Status: DC
  Administered 2016-09-30 – 2016-10-01 (×2): 1 via ORAL
  Filled 2016-09-30 (×2): qty 1

## 2016-09-30 MED ORDER — SODIUM CHLORIDE 0.9 % IV BOLUS (SEPSIS)
1000.0000 mL | Freq: Once | INTRAVENOUS | Status: AC
  Administered 2016-09-30: 1000 mL via INTRAVENOUS

## 2016-09-30 MED ORDER — DIAZEPAM 5 MG PO TABS
5.0000 mg | ORAL_TABLET | Freq: Four times a day (QID) | ORAL | Status: DC | PRN
  Administered 2016-09-30 – 2016-10-01 (×4): 5 mg via ORAL
  Filled 2016-09-30 (×4): qty 1

## 2016-09-30 MED ORDER — FLUTICASONE PROPIONATE 50 MCG/ACT NA SUSP
1.0000 | Freq: Every day | NASAL | Status: DC | PRN
  Filled 2016-09-30: qty 16

## 2016-09-30 MED ORDER — ALBUTEROL SULFATE (2.5 MG/3ML) 0.083% IN NEBU: 2.5000 mg | INHALATION_SOLUTION | RESPIRATORY_TRACT | Status: DC | PRN

## 2016-09-30 MED ORDER — METRONIDAZOLE IN NACL 5-0.79 MG/ML-% IV SOLN
500.0000 mg | Freq: Three times a day (TID) | INTRAVENOUS | Status: DC
  Administered 2016-09-30 – 2016-10-01 (×4): 500 mg via INTRAVENOUS
  Filled 2016-09-30 (×4): qty 100

## 2016-09-30 MED ORDER — ENOXAPARIN SODIUM 40 MG/0.4ML ~~LOC~~ SOLN
40.0000 mg | SUBCUTANEOUS | Status: DC
  Filled 2016-09-30: qty 0.4

## 2016-09-30 MED ORDER — FINASTERIDE 5 MG PO TABS
5.0000 mg | ORAL_TABLET | Freq: Every day | ORAL | Status: DC
  Administered 2016-09-30 – 2016-10-01 (×2): 5 mg via ORAL
  Filled 2016-09-30 (×2): qty 1

## 2016-09-30 MED ORDER — ACETAMINOPHEN 650 MG RE SUPP: 650.0000 mg | Freq: Four times a day (QID) | RECTAL | Status: DC | PRN

## 2016-09-30 MED ORDER — ACETAMINOPHEN 325 MG PO TABS
650.0000 mg | ORAL_TABLET | Freq: Four times a day (QID) | ORAL | Status: DC | PRN
Start: 2016-09-30 — End: 2016-10-01

## 2016-09-30 MED ORDER — FAMOTIDINE IN NACL 20-0.9 MG/50ML-% IV SOLN
20.0000 mg | Freq: Two times a day (BID) | INTRAVENOUS | Status: DC
  Administered 2016-09-30 (×2): 20 mg via INTRAVENOUS
  Filled 2016-09-30 (×4): qty 50

## 2016-09-30 MED ORDER — SODIUM CHLORIDE 0.9 % IV SOLN
INTRAVENOUS | Status: DC
  Administered 2016-09-30 (×2): via INTRAVENOUS

## 2016-09-30 NOTE — ED Provider Notes (Signed)
Patient signed out to me by J Hedges PA-C. He is a 82 year old male with hx of CVA and indwelling foley who presents with abdominal pain, N/V/D. Recently being treated for catheter related UTI and is on Cipro. CT A&P is pending. Plan for PO challenge.  On recheck, nursing staff informed me patient is vomiting. Zofran ordered. CT shows enterocolitis.  2nd recheck, patient is not vomiting but still complains of nausea and abdominal pain. Abdomen is soft and has generalized tenderness. Phenergan ordered and another 1L IVF. Also added LFTs and UA to labs.  3rd recheck, patient still complaining of persistent nausea and heartburn. Pepcid ordered. LFTs and UA are unremarkable. Will consult hospitalist for admission.    David BornKelly Marie Jejuan Scala, PA-C 09/30/16 0543    Tomasita CrumbleAdeleke Oni, MD 09/30/16 [phone]

## 2016-09-30 NOTE — ED Notes (Signed)
Pt vomiting. Unable to keep fluids down.
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
## 2016-09-30 NOTE — H&P (Signed)
History and Physical    David Duke ZOX:[id number] DOB: March 24, 1935 DOA: 09/29/2016  Referring MD/NP/PA:   PCP: Esperanza Richters, PA-C   Patient coming from:  The patient is coming from home.  At baseline, pt is independent for most of ADL.  Chief Complaint: Nausea, vomiting, diarrhea, abdominal pain  HPI: David Duke is a 82 y.o. male with medical history significant of hyperlipidemia, COPD, depression, slightly, ICH, CKD-III, BPH, dementia, tobacco abuse, right eye blindness, who presents with nausea, vomiting, diarrhea, abdominal pain.  Per patient's wife, patient was recently diagnosed with UTI, and is currently taking ciprofloxacin. He developed nausea, vomiting, diarrhea and abdominal pain since yesterday. He vomited 4-5 times, without blood in the vomitus. He had 1-2 times of bowel movements with loose stool today. Patient also has epigastric abdominal pain, which is moderate, constant, nonradiating. Patient had fever with temperature 100.2 at home and chills. Patient does not have chest pain, shortness of breath, cough, symptoms of UTI or unilateral weakness.  ED Course: pt was found to have WBC 15.1, negative urinalysis, stable renal function, temperature 98.1, no tachycardia, O2 sat 96% on room air, CT abdomen/pelvis showed enterocolitis. Pt is admitted to med-surg bed as inpt.  Review of Systems:   General: has fevers, chills, no changes in body weight, has poor appetite, has fatigue HEENT: no blurry vision, hearing changes or sore throat Respiratory: no dyspnea, coughing, wheezing CV: no chest pain, no palpitations GI: has nausea, vomiting, abdominal pain, diarrhea, no constipation GU: no dysuria, burning on urination, increased urinary frequency, hematuria  Ext: no leg edema Neuro: no unilateral weakness, numbness, or tingling, no vision change or hearing loss Skin: no rash, no skin tear. MSK: No muscle spasm, no deformity, no limitation of range of movement in  spin Heme: No easy bruising.  Travel history: No recent long distant travel.  Allergy:  Allergies  Allergen Reactions  . Penicillins Hives, Diarrhea and Nausea And Vomiting    Past Medical History:  Diagnosis Date  . AKI (acute kidney injury) (HCC)   . Anxiety   . Blind right eye   . BPH (benign prostatic hypertrophy) with urinary retention   . COPD (chronic obstructive pulmonary disease) (HCC)   . Depression   . High cholesterol   . Macular degeneration of both eyes   . Renal disorder   . Shortness of breath dyspnea   . Stroke [facility])     Past Surgical History:  Procedure Laterality Date  . NO PAST SURGERIES      Social History:  reports that he has been smoking Cigarettes.  He has a 30.00 pack-year smoking history. He has never used smokeless tobacco. He reports that he does not drink alcohol or use drugs.  Family History:  Family History  Problem Relation Age of Onset  . Colon cancer Mother   . Cerebral aneurysm Father      Prior to Admission medications   Medication Sig Start Date End Date Taking? Authorizing Provider  albuterol (PROVENTIL HFA;VENTOLIN HFA) 108 (90 Base) MCG/ACT inhaler Inhale 2 puffs into the lungs every 4 (four) hours as needed for wheezing or shortness of breath (or cough). 09/23/16  Yes [name], PA-C  atorvastatin (LIPITOR) 20 MG tablet Take 1 tablet (20 mg total) by mouth daily at 6 PM. 09/03/16  Yes Esperanza Richters, PA-C  ciprofloxacin (CIPRO) 500 MG tablet Take 1 tablet (500 mg total) by mouth 2 (two) times daily. 09/18/16  Yes Jacalyn Lefevre, MD  diazepam (VALIUM) 5  MG tablet Take 1 tablet (5 mg total) by mouth every 6 (six) hours as needed for anxiety. 09/25/16  Yes John Molpus, MD  finasteride (PROSCAR) 5 MG tablet Take 5 mg by mouth daily. 09/22/16  Yes Historical Provider, MD  fluticasone (FLONASE) 50 MCG/ACT nasal spray Place 1 spray into both nostrils daily. Patient taking differently: Place 1 spray into both nostrils daily as needed  for allergies or rhinitis.  12/11/15  Yes [name] Love, PA-C  Multiple Vitamin (MULTIVITAMIN WITH MINERALS) TABS tablet Take 1 tablet by mouth daily.   Yes Historical Provider, MD    Physical Exam: Vitals:   09/30/16 0200 09/30/16 0300 09/30/16 0400 09/30/16 0423  BP: 123/70 121/70 111/70 111/70  Pulse: 85 85 87 84  Resp: 23 22 22 17   Temp:      TempSrc:      SpO2: 94% 95% 98% 96%  Weight:      Height:       General: Not in acute distress HEENT:       Eyes: PERRL, EOMI, no scleral icterus.       ENT: No discharge from the ears and nose, no pharynx injection, no tonsillar enlargement.        Neck: No JVD, no bruit, no mass felt. Heme: No neck lymph node enlargement. Cardiac: S1/S2, RRR, No murmurs, No gallops or rubs. Respiratory: No rales, wheezing, rhonchi or rubs. GI: Soft, nondistended, has tenderness in epigastric area. no rebound pain, no organomegaly, BS present. GU: No hematuria Ext: No pitting leg edema bilaterally. 2+DP/PT pulse bilaterally. Musculoskeletal: No joint deformities, No joint redness or warmth, no limitation of ROM in spin. Skin: No rashes.  Neuro: Alert, oriented X3, cranial nerves II-XII grossly intact, moves all extremities normally. Psych: Patient is not psychotic, no suicidal or hemocidal ideation.  Labs on Admission: I have personally reviewed following labs and imaging studies  CBC:  Recent Labs Lab 09/29/16 2026  WBC 15.1*  NEUTROABS 14.0*  HGB 15.0  HCT 44.3  MCV 92.1  PLT 207   Basic Metabolic Panel:  Recent Labs Lab 09/29/16 2026  NA 139  K 3.9  CL 105  CO2 24  GLUCOSE 116*  BUN 22*  CREATININE 1.40*  CALCIUM 8.3*   GFR: Estimated Creatinine Clearance: 39 mL/min (by C-G formula based on SCr of 1.4 mg/dL (H)). Liver Function Tests:  Recent Labs Lab 09/29/16 2026  AST 23  ALT 22  ALKPHOS 71  BILITOT 1.0  PROT 6.9  ALBUMIN 3.7   No results for input(s): LIPASE, AMYLASE in the last 168 hours. No results for  input(s): AMMONIA in the last 168 hours. Coagulation Profile: No results for input(s): INR, PROTIME in the last 168 hours. Cardiac Enzymes: No results for input(s): CKTOTAL, CKMB, CKMBINDEX, TROPONINI in the last 168 hours. BNP (last 3 results) No results for input(s): PROBNP in the last 8760 hours. HbA1C: No results for input(s): HGBA1C in the last 72 hours. CBG:  Recent Labs Lab 09/29/16 1823  GLUCAP 109*   Lipid Profile: No results for input(s): CHOL, HDL, LDLCALC, TRIG, CHOLHDL, LDLDIRECT in the last 72 hours. Thyroid Function Tests: No results for input(s): TSH, T4TOTAL, FREET4, T3FREE, THYROIDAB in the last 72 hours. Anemia Panel: No results for input(s): VITAMINB12, FOLATE, FERRITIN, TIBC, IRON, RETICCTPCT in the last 72 hours. Urine analysis:    Component Value Date/Time   COLORURINE STRAW (A) 09/30/2016 0336   APPEARANCEUR CLEAR 09/30/2016 0336   LABSPEC 1.015 09/30/2016 0336   PHURINE  5.0 09/30/2016 0336   GLUCOSEU NEGATIVE 09/30/2016 0336   HGBUR SMALL (A) 09/30/2016 0336   BILIRUBINUR NEGATIVE 09/30/2016 0336   KETONESUR NEGATIVE 09/30/2016 0336   PROTEINUR NEGATIVE 09/30/2016 0336   UROBILINOGEN 0.2 03/31/2015 1850   NITRITE NEGATIVE 09/30/2016 0336   LEUKOCYTESUR NEGATIVE 09/30/2016 0336   Sepsis Labs: @LABRCNTIP (procalcitonin:4,lacticidven:4) )No results found for this or any previous visit (from the past 240 hour(s)).   Radiological Exams on Admission: Dg Chest 2 View  Result Date: 09/29/2016 CLINICAL DATA:  Acute onset of nausea, vomiting and diarrhea. Urinary tract infection. Cough. Initial encounter. EXAM: CHEST  2 VIEW COMPARISON:  Chest radiograph performed 09/18/2016 FINDINGS: The lungs are well-aerated. Mild vascular congestion is noted. There is no evidence of focal opacification, pleural effusion or pneumothorax. The heart is normal in size; the mediastinal contour is within normal limits. No acute osseous abnormalities are seen. IMPRESSION: Mild  vascular congestion noted.  Lungs remain grossly clear. Electronically Signed   By: Roanna Raider M.D.   On: 09/29/2016 20:03   Ct Abdomen Pelvis W Contrast  Result Date: 09/29/2016 CLINICAL DATA:  Nausea, vomiting and diarrhea.  Fever. EXAM: CT ABDOMEN AND PELVIS WITH CONTRAST TECHNIQUE: Multidetector CT imaging of the abdomen and pelvis was performed using the standard protocol following bolus administration of intravenous contrast. CONTRAST:  ISOVUE-300 IOPAMIDOL (ISOVUE-300) INJECTION 61% COMPARISON:  None. FINDINGS: Lower chest: Visualized cardiac chambers are normal in size. There is aortic atherosclerosis. There is dependent atelectasis at each lung base. Paraesophageal calcifications likely reflecting calcified lymph nodes possibly related to old granulomatous disease. Hepatobiliary: 7 and 4 mm hypodensities in the right hepatic lobe statistically more likely to represent tiny cysts or hemangiomata but are too small to further characterize. No biliary dilatation. Gallbladder is physiologically distended without calculi. Pancreas: Normal Spleen: Normal Adrenals/Urinary Tract: Normal bilateral adrenal glands. Right kidney is unremarkable. There is a 2 cm exophytic lesion off the interpolar aspect of the left kidney with at least 3 smaller subcentimeter hypodensities. The 2 cm lesion is not a simple cyst by Hounsfield criteria and may represent a complex cyst but further correlation is recommended with prominent with IV contrast for better characterization either with CT or MRI. Foley catheter seen within the urinary bladder. Stomach/Bowel: Diffuse fluid-filled small bowel loops with mild distention. Fluid-filled large bowel also noted. There is diverticulosis of distal descending and sigmoid colon without acute diverticulitis. No definite transition zone is noted to suggest a mechanical obstruction. Moderate-to-marked fluid-filled distention of the stomach possible mild reflux into the distal esophagus  versus delayed esophageal emptying due to distended stomach. Vascular/Lymphatic: Fusiform aneurysmal dilatation of the infrarenal aorta up to 4.3 cm terminating at the aortic bifurcation. Aneurysmal dilatation of the left common iliac artery up to 2.1 cm. No lymphadenopathy. Reproductive: Enlarged prostate measuring 4.5 x 4.6 x 3.9 cm. Other: No abdominal wall hernia or abnormality. No abdominopelvic ascites. Musculoskeletal: Degenerative disc space narrowing L5-S1. No acute osseous abnormality. IMPRESSION: 1. Diffuse fluid-filled small and large bowel distention consistent with an enterocolitis. Diverticulosis of the distal descending and sigmoid colon without acute diverticulitis. 2. Non simple cyst of the interpolar left kidney measuring 2 cm. Smaller adjacent subcentimeter cysts are present in the left kidney. CT or MRI without and with IV contrast is recommended for further characterization of the dominant lesion. This recommendation follows ACR consensus guidelines: Management of the Incidental Renal Mass on CT: A White Paper of the ACR Incidental Findings Committee. J Am Coll Radiol 2017. 3. Fusiform infrarenal abdominal  aortic aneurysm measuring 4.3 cm with aneurysmal dilatation of the left common iliac artery up to 2.1 cm. Recommend followup by ultrasound in 1 year. This recommendation follows ACR consensus guidelines: White Paper of the ACR Incidental Findings Committee II on Vascular Findings. J Am Coll Radiol 2013; 10:789-794. 4. Subcentimeter right hepatic hypodensities too small to further characterize but statistically consistent with cysts or hemangiomata. Electronically Signed   By: Tollie Ethavid  Kwon M.D.   On: 09/29/2016 22:10     EKG:  Not done in ED, will get one.   Assessment/Plan Principal Problem:   Enterocolitis Active Problems:   Anxiety   COPD (chronic obstructive pulmonary disease) (HCC)   BPH (benign prostatic hyperplasia)   HLD (hyperlipidemia)   Tobacco abuse   Nausea vomiting  and diarrhea   Enterocolitis: Patient's nausea, vomiting, diarrhea and abdominal pain are likely caused by enterocolitis as evidenced by CT abdomen/pelvis. Given recent cipro use, will need to r/u C diff colitis. Pt has leukocytosis and  soft blood pressure, indicating possible sepsis.   - will admit to tele bed - prn morphine for pain  - prn Zofran for nausea  - IV pepcid - Will check C diff pcr. - Blood culture x 2 - IV antibiotics: Flagyl - will get Procalcitonin and trend lactic acid level per sepsis protocol - IVF: 2.0 L of NS bolus in ED, followed by 125 cc/h  COPD: stable. No wheezing or rhonchi on auscultation. No acute exacerbation. -When necessary albuterol nebulizers  Anxiety: -continue home valium  BPH: stable - Continue proscar  HLD: Last LDL was 33 on 07/30/16 -Continue home medications: Lipitor  Tobacco abuse: -Did counseling about importance of quitting smoking -Nicotine patch  DVT ppx: SQ Lovenox Code Status: Full code Family Communication: Yes, patient's wife at bed side Disposition Plan:  Anticipate discharge back to previous home environment Consults called:  none Admission status: medical floor/obs    Date of Service 09/30/2016    Lorretta HarpIU, Dereona Kolodny [facility] Pager [phone]  If 7PM-7AM, please contact night-coverage [email or web address] Password [email or web address] 09/30/2016, 5:24 AM

## 2016-09-30 NOTE — Progress Notes (Signed)
Marcell BarlowDavid R Keddy is a 82 y.o. male with medical history significant of hyperlipidemia, COPD, depression, slightly, ICH, CKD-III, BPH, dementia, tobacco abuse, right eye blindness, recently diag with UTI, on ciprofloxacin presents with nausea, vomiting, diarrhea, abdominal pain. CT abd and pelvis shows enterocolitis.   C DIFF was ruled out. He was started on flagyl. His symptoms of nausea, vomiting and abdominal pain have improved. 2 episodes of loose BM today. Started on clears.  Monitor.   Kathlen Modyvijaya Nansi [place], MD [phone]

## 2016-10-01 ENCOUNTER — Ambulatory Visit: Payer: Medicare Other | Admitting: Medical

## 2016-10-01 DIAGNOSIS — K529 Noninfective gastroenteritis and colitis, unspecified: Secondary | ICD-10-CM | POA: Diagnosis not present

## 2016-10-01 DIAGNOSIS — F419 Anxiety disorder, unspecified: Secondary | ICD-10-CM | POA: Diagnosis not present

## 2016-10-01 DIAGNOSIS — E785 Hyperlipidemia, unspecified: Secondary | ICD-10-CM

## 2016-10-01 DIAGNOSIS — R112 Nausea with vomiting, unspecified: Secondary | ICD-10-CM

## 2016-10-01 DIAGNOSIS — Z720 Tobacco use: Secondary | ICD-10-CM

## 2016-10-01 DIAGNOSIS — R197 Diarrhea, unspecified: Secondary | ICD-10-CM

## 2016-10-01 DIAGNOSIS — J449 Chronic obstructive pulmonary disease, unspecified: Secondary | ICD-10-CM | POA: Diagnosis not present

## 2016-10-01 LAB — CBC
HEMATOCRIT: 36.8 % — AB (ref 39.0–52.0)
HEMOGLOBIN: 12.2 g/dL — AB (ref 13.0–17.0)
MCH: 30.2 pg (ref 26.0–34.0)
MCHC: 33.2 g/dL (ref 30.0–36.0)
MCV: 91.1 fL (ref 78.0–100.0)
Platelets: 169 10*3/uL (ref 150–400)
RBC: 4.04 MIL/uL — ABNORMAL LOW (ref 4.22–5.81)
RDW: 13.6 % (ref 11.5–15.5)
WBC: 7.3 10*3/uL (ref 4.0–10.5)

## 2016-10-01 LAB — GLUCOSE, CAPILLARY: Glucose-Capillary: 87 mg/dL (ref 65–99)

## 2016-10-01 LAB — BASIC METABOLIC PANEL
ANION GAP: 7 (ref 5–15)
BUN: 17 mg/dL (ref 6–20)
CO2: 20 mmol/L — ABNORMAL LOW (ref 22–32)
Calcium: 7.6 mg/dL — ABNORMAL LOW (ref 8.9–10.3)
Chloride: 113 mmol/L — ABNORMAL HIGH (ref 101–111)
Creatinine, Ser: 1.23 mg/dL (ref 0.61–1.24)
GFR calc Af Amer: >60 mL/min (ref >60)
GFR, EST NON AFRICAN AMERICAN: 54 mL/min — AB (ref >60)
GLUCOSE: 76 mg/dL (ref 65–99)
POTASSIUM: 3.8 mmol/L (ref 3.5–5.1)
Sodium: 140 mmol/L (ref 135–145)

## 2016-10-01 MED ORDER — METRONIDAZOLE 500 MG PO TABS: 500.0000 mg | ORAL_TABLET | Freq: Three times a day (TID) | ORAL | 0 refills | Status: AC

## 2016-10-01 MED ORDER — ONDANSETRON 4 MG PO TBDP: 4.0000 mg | ORAL_TABLET | Freq: Three times a day (TID) | ORAL | 0 refills | Status: DC | PRN

## 2016-10-01 NOTE — Progress Notes (Addendum)
Discharge instructions reviewed with patient. Patient and wife verbalized understanding. Patient to be discharged via private vehicle.

## 2016-10-01 NOTE — Discharge Instructions (Addendum)
David Duke,  You were in the hospital because of your diarrhea, vomiting and abdominal pain. You had a CT scan that showed infection of your intestines. You were initially treated with antibiotics but your test for Clostridium difficile was negative. You likely have an infection causing this inflammation. Since you have been getting better on antibiotics, I will continue your treatment. Please keep very well hydrated to account for water losses from diarrhea. Please follow-up with your primary care physician.

## 2016-10-01 NOTE — Progress Notes (Signed)
Patient is forgetful and states that he is leaving without regard to MD discharging him. Patient has dressed himself and states he will leave when his wife gets here. I have attempted to call wife several times with no response. Will continue to monitor patient.

## 2016-10-01 NOTE — Consult Note (Signed)
   [facility]   10/01/2016  David BarlowDavid R Duke 04/07/1935 [id number]  Referral received that the patient has been hospitalized.  [facility] with Telephonic Nurse, Social worker attempts, and Pharmacist.  Noted difficulty getting in touch with the patient and wife, in maintaining contact.  Chart review reveals the patient is a 82 y.o.malewith medical history significant of hyperlipidemia, COPD, depression, slightly, ICH, CKD-III, BPH, dementia, tobacco abuse, right eye blindness, recently diag with UTI, on ciprofloxacin presents with nausea, vomiting, diarrhea, abdominal pain. CT abd and pelvis shows enterocolitis.   C DIFF was ruled out. Spoke with the inpatient RNCM, Arna Mediciora, to alert [facility].  She states patient will likely discharge home later today but, that this writer could call the patient in the room.  Call placed to patient, patient confirmed his name birthday, and address.  Patient expressed difficulty knowing  the name of the hospital states, "[facility] and my wife left from her last night.  I hope she is coming back to this treatment place, she knows that I am in the same place from last night."  [facility]ained [facility].  He states, "yes, that's alright but my wife will be the best person to call for everything."  Will attempt to follow up with wife. Attempt to contact wife at [phone] was not successful.  Called inpatient RNCM back to update her on contact needed.   [facility] does not replace or interfere with any services that is needed or arranged for this patient.  For questions, please contact:  Charlesetta ShanksVictoria Omauri Boeve, RN BSN CCM [facility]  [phone] business mobile phone Toll free office [phone]

## 2016-10-01 NOTE — Discharge Summary (Signed)
Physician Discharge Summary  David BarlowDavid R Duke VHQ:[id number]:[id number] DOB: 04/14/1935 DOA: 09/29/2016  PCP: Esperanza RichtersSaguier, Edward, PA-C  Admit date: 09/29/2016 Discharge date: 10/01/2016  Admitted From: Home Disposition:  Home  Recommendations for Outpatient Follow-up:  1. Follow up with PCP in 1 week 2. Follow-up labs: blood culture   Discharge Condition: Stable CODE STATUS: Full code Diet recommendation: Increase fluid intake    Hospital course:  Chief Complaint: Nausea, vomiting, diarrhea, abdominal pain  HPI: David Duke is a 82 y.o. male with medical history significant of hyperlipidemia, COPD, depression, slightly, ICH, CKD-III, BPH, dementia, tobacco abuse, right eye blindness, who presents with nausea, vomiting, diarrhea, abdominal pain.  Per patient's wife, patient was recently diagnosed with UTI, and is currently taking ciprofloxacin. He developed nausea, vomiting, diarrhea and abdominal pain since yesterday. He vomited 4-5 times, without blood in the vomitus. He had 1-2 times of bowel movements with loose stool today. Patient also has epigastric abdominal pain, which is moderate, constant, nonradiating. Patient had fever with temperature 100.2 at home and chills. Patient does not have chest pain, shortness of breath, cough, symptoms of UTI or unilateral weakness.  ED Course: pt was found to have WBC 15.1, negative urinalysis, stable renal function, temperature 98.1, no tachycardia, O2 sat 96% on room air, CT abdomen/pelvis showed enterocolitis. Pt is admitted to med-surg bed as inpt.   Enterocolitis As evidenced by CT scan of abdomen. Empiric treatment for clostridium difficile started with metronidazole. Patient afebrile and diarrhea resolved. IV fluids given and discontinued prior to discharge. Clostridium difficile PCR negative. Patient sent home with prescription for metronidazole.  COPD Stable. Albuterol as needed  Anxiety Continue Valium  BPH Stable. Continued  Proscar  Hyperlipidemia Last LDL of 33. Continued statin  Tobacco abuse Patient received counseling. Nicotine patch while in the hospital.  Discharge Diagnoses:  Principal Problem:   Enterocolitis Active Problems:   Anxiety   COPD (chronic obstructive pulmonary disease) (HCC)   BPH (benign prostatic hyperplasia)   HLD (hyperlipidemia)   Tobacco abuse   Nausea vomiting and diarrhea    Discharge Instructions   Allergies as of 10/01/2016      Reactions   Penicillins Hives, Diarrhea, Nausea And Vomiting      Medication List    STOP taking these medications   ciprofloxacin 500 MG tablet Commonly known as:  CIPRO     TAKE these medications   albuterol 108 (90 Base) MCG/ACT inhaler Commonly known as:  PROVENTIL HFA;VENTOLIN HFA Inhale 2 puffs into the lungs every 4 (four) hours as needed for wheezing or shortness of breath (or cough).   atorvastatin 20 MG tablet Commonly known as:  LIPITOR Take 1 tablet (20 mg total) by mouth daily at 6 PM.   diazepam 5 MG tablet Commonly known as:  VALIUM Take 1 tablet (5 mg total) by mouth every 6 (six) hours as needed for anxiety.   finasteride 5 MG tablet Commonly known as:  PROSCAR Take 5 mg by mouth daily.   fluticasone 50 MCG/ACT nasal spray Commonly known as:  FLONASE Place 1 spray into both nostrils daily. What changed:  when to take this  reasons to take this   metroNIDAZOLE 500 MG tablet Commonly known as:  FLAGYL Take 1 tablet (500 mg total) by mouth 3 (three) times daily.   multivitamin with minerals Tabs tablet Take 1 tablet by mouth daily.   ondansetron 4 MG disintegrating tablet Commonly known as:  ZOFRAN-ODT Take 1 tablet (4 mg total) by mouth every 8 (  eight) hours as needed for nausea or vomiting.      Follow-up Information    Saguier, Kateri Mc. Schedule an appointment as soon as possible for a visit in 1 week(s).   Specialties:  Internal Medicine, Family Medicine Contact information: [phone] [place] [address] [phone]          Allergies  Allergen Reactions  . Penicillins Hives, Diarrhea and Nausea And Vomiting    Consultations:  None   Procedures/Studies: Dg Chest 2 View  Result Date: 09/29/2016 CLINICAL DATA:  Acute onset of nausea, vomiting and diarrhea. Urinary tract infection. Cough. Initial encounter. EXAM: CHEST  2 VIEW COMPARISON:  Chest radiograph performed 09/18/2016 FINDINGS: The lungs are well-aerated. Mild vascular congestion is noted. There is no evidence of focal opacification, pleural effusion or pneumothorax. The heart is normal in size; the mediastinal contour is within normal limits. No acute osseous abnormalities are seen. IMPRESSION: Mild vascular congestion noted.  Lungs remain grossly clear. Electronically Signed   By: Roanna Raider M.D.   On: 09/29/2016 20:03   Dg Chest 2 View  Result Date: 09/18/2016 CLINICAL DATA:  Altered mental status EXAM: CHEST  2 VIEW COMPARISON:  June 09, 2016 FINDINGS: The heart size and mediastinal contours are within normal limits. There is no focal infiltrate, pulmonary edema, or pleural effusion. The visualized skeletal structures are unremarkable. IMPRESSION: No active cardiopulmonary disease. Electronically Signed   By: Sherian Rein M.D.   On: 09/18/2016 16:36   Ct Head Wo Contrast  Result Date: 09/18/2016 CLINICAL DATA:  Altered mental status. Previous hemorrhage left occipital lobe EXAM: CT HEAD WITHOUT CONTRAST TECHNIQUE: Contiguous axial images were obtained from the base of the skull through the vertex without intravenous contrast. COMPARISON:  Head CT November 26, 2015; brain MRI Jan 11, 2016 FINDINGS: Brain: Moderate diffuse atrophy is stable. There is evidence of prior infarct in the left occipital lobe in the area of previous hemorrhage. No residual hemorrhage is evident in this area. There is currently no acute hemorrhage, mass, or extra-axial fluid collection. No midline shift.  There is patchy small vessel disease in the centra semiovale bilaterally. No acute infarct evident. Vascular: No hyperdense vessel. There is calcification in the right carotid siphon region. Skull: Bony calvarium appears intact. Sinuses/Orbits: There is mild mucosal thickening in several anterior ethmoid air cells bilaterally. Other visualized paranasal sinuses are clear. Orbits appear symmetric bilaterally. Note that there has been cataract removal on the left. Other: Visualized mastoid air cells are clear. IMPRESSION: Atrophy. Prior infarct left occipital lobe. There has been resolution of hemorrhage in this area compared to prior studies. No acute hemorrhage currently evident. There is mild periventricular small vessel disease. No acute hemorrhage or acute infarct evident. No mass, midline shift, or extra-axial fluid collection. Mild vascular calcification noted in the right carotid siphon region. Mild ethmoid air cell disease bilaterally. Electronically Signed   By: Bretta Bang III M.D.   On: 09/18/2016 16:27   Ct Abdomen Pelvis W Contrast  Result Date: 09/29/2016 CLINICAL DATA:  Nausea, vomiting and diarrhea.  Fever. EXAM: CT ABDOMEN AND PELVIS WITH CONTRAST TECHNIQUE: Multidetector CT imaging of the abdomen and pelvis was performed using the standard protocol following bolus administration of intravenous contrast. CONTRAST:  ISOVUE-300 IOPAMIDOL (ISOVUE-300) INJECTION 61% COMPARISON:  None. FINDINGS: Lower chest: Visualized cardiac chambers are normal in size. There is aortic atherosclerosis. There is dependent atelectasis at each lung base. Paraesophageal calcifications likely reflecting calcified lymph nodes possibly  related to old granulomatous disease. Hepatobiliary: 7 and 4 mm hypodensities in the right hepatic lobe statistically more likely to represent tiny cysts or hemangiomata but are too small to further characterize. No biliary dilatation. Gallbladder is physiologically distended without  calculi. Pancreas: Normal Spleen: Normal Adrenals/Urinary Tract: Normal bilateral adrenal glands. Right kidney is unremarkable. There is a 2 cm exophytic lesion off the interpolar aspect of the left kidney with at least 3 smaller subcentimeter hypodensities. The 2 cm lesion is not a simple cyst by Hounsfield criteria and may represent a complex cyst but further correlation is recommended with prominent with IV contrast for better characterization either with CT or MRI. Foley catheter seen within the urinary bladder. Stomach/Bowel: Diffuse fluid-filled small bowel loops with mild distention. Fluid-filled large bowel also noted. There is diverticulosis of distal descending and sigmoid colon without acute diverticulitis. No definite transition zone is noted to suggest a mechanical obstruction. Moderate-to-marked fluid-filled distention of the stomach possible mild reflux into the distal esophagus versus delayed esophageal emptying due to distended stomach. Vascular/Lymphatic: Fusiform aneurysmal dilatation of the infrarenal aorta up to 4.3 cm terminating at the aortic bifurcation. Aneurysmal dilatation of the left common iliac artery up to 2.1 cm. No lymphadenopathy. Reproductive: Enlarged prostate measuring 4.5 x 4.6 x 3.9 cm. Other: No abdominal wall hernia or abnormality. No abdominopelvic ascites. Musculoskeletal: Degenerative disc space narrowing L5-S1. No acute osseous abnormality. IMPRESSION: 1. Diffuse fluid-filled small and large bowel distention consistent with an enterocolitis. Diverticulosis of the distal descending and sigmoid colon without acute diverticulitis. 2. Non simple cyst of the interpolar left kidney measuring 2 cm. Smaller adjacent subcentimeter cysts are present in the left kidney. CT or MRI without and with IV contrast is recommended for further characterization of the dominant lesion. This recommendation follows ACR consensus guidelines: Management of the Incidental Renal Mass on CT: A White  Paper of the ACR Incidental Findings Committee. J Am Coll Radiol 2017. 3. Fusiform infrarenal abdominal aortic aneurysm measuring 4.3 cm with aneurysmal dilatation of the left common iliac artery up to 2.1 cm. Recommend followup by ultrasound in 1 year. This recommendation follows ACR consensus guidelines: White Paper of the ACR Incidental Findings Committee II on Vascular Findings. J Am Coll Radiol 2013; 10:789-794. 4. Subcentimeter right hepatic hypodensities too small to further characterize but statistically consistent with cysts or hemangiomata. Electronically Signed   By: Tollie Eth M.D.   On: 09/29/2016 22:10      Subjective: No nausea, vomiting or diarrhea. No abdominal pain.  Discharge Exam: Vitals:   09/30/16 2111 10/01/16 0600  BP: 127/68 (!) 142/72  Pulse: 72 76  Resp: 18 16  Temp: 98.3 F (36.8 C) 97.7 F (36.5 C)   Vitals:   09/30/16 1020 09/30/16 1438 09/30/16 2111 10/01/16 0600  BP: (!) 112/54 (!) 113/58 127/68 (!) 142/72  Pulse: 86 80 72 76  Resp: 18 16 18 16   Temp: 98.4 F (36.9 C) 98.8 F (37.1 C) 98.3 F (36.8 C) 97.7 F (36.5 C)  TempSrc: Oral Oral Oral Oral  SpO2: 96% 97% 98% 92%  Weight: 64.4 kg (142 lb)     Height: 5\' 8"  (1.727 m)       General: Pt is alert, awake, not in acute distress Cardiovascular: RRR, S1/S2 +, no rubs, no gallops Respiratory: CTA bilaterally, no wheezing, no rhonchi Abdominal: Soft, NT, ND, bowel sounds + Extremities: no edema, no cyanosis    The results of significant diagnostics from this hospitalization (including imaging, microbiology, ancillary and  laboratory) are listed below for reference.     Microbiology: Recent Results (from the past 240 hour(s))  C difficile quick scan w PCR reflex     Status: None   Collection Time: 09/30/16  7:56 AM  Result Value Ref Range Status   C Diff antigen NEGATIVE NEGATIVE Final   C Diff toxin NEGATIVE NEGATIVE Final   C Diff interpretation No C. difficile detected.  Final      Labs: BNP (last 3 results)  Recent Labs  04/08/16 1400 06/10/16 0003  BNP 14.5 15.5   Basic Metabolic Panel:  Recent Labs Lab 09/29/16 2026 10/01/16 0407  NA 139 140  K 3.9 3.8  CL 105 113*  CO2 24 20*  GLUCOSE 116* 76  BUN 22* 17  CREATININE 1.40* 1.23  CALCIUM 8.3* 7.6*   Liver Function Tests:  Recent Labs Lab 09/29/16 2026  AST 23  ALT 22  ALKPHOS 71  BILITOT 1.0  PROT 6.9  ALBUMIN 3.7   No results for input(s): LIPASE, AMYLASE in the last 168 hours. No results for input(s): AMMONIA in the last 168 hours. CBC:  Recent Labs Lab 09/29/16 2026 10/01/16 0407  WBC 15.1* 7.3  NEUTROABS 14.0*  --   HGB 15.0 12.2*  HCT 44.3 36.8*  MCV 92.1 91.1  PLT 207 169   Cardiac Enzymes: No results for input(s): CKTOTAL, CKMB, CKMBINDEX, TROPONINI in the last 168 hours. BNP: Invalid input(s): POCBNP CBG:  Recent Labs Lab 09/29/16 1823 09/30/16 1703 10/01/16 0739  GLUCAP 109* 79 87   D-Dimer No results for input(s): DDIMER in the last 72 hours. Hgb A1c No results for input(s): HGBA1C in the last 72 hours. Lipid Profile No results for input(s): CHOL, HDL, LDLCALC, TRIG, CHOLHDL, LDLDIRECT in the last 72 hours. Thyroid function studies No results for input(s): TSH, T4TOTAL, T3FREE, THYROIDAB in the last 72 hours.  Invalid input(s): FREET3 Anemia work up No results for input(s): VITAMINB12, FOLATE, FERRITIN, TIBC, IRON, RETICCTPCT in the last 72 hours. Urinalysis    Component Value Date/Time   COLORURINE STRAW (A) 09/30/2016 0336   APPEARANCEUR CLEAR 09/30/2016 0336   LABSPEC 1.015 09/30/2016 0336   PHURINE 5.0 09/30/2016 0336   GLUCOSEU NEGATIVE 09/30/2016 0336   HGBUR SMALL (A) 09/30/2016 0336   BILIRUBINUR NEGATIVE 09/30/2016 0336   KETONESUR NEGATIVE 09/30/2016 0336   PROTEINUR NEGATIVE 09/30/2016 0336   UROBILINOGEN 0.2 03/31/2015 1850   NITRITE NEGATIVE 09/30/2016 0336   LEUKOCYTESUR NEGATIVE 09/30/2016 0336   Sepsis Labs Invalid  input(s): PROCALCITONIN,  WBC,  LACTICIDVEN Microbiology Recent Results (from the past 240 hour(s))  C difficile quick scan w PCR reflex     Status: None   Collection Time: 09/30/16  7:56 AM  Result Value Ref Range Status   C Diff antigen NEGATIVE NEGATIVE Final   C Diff toxin NEGATIVE NEGATIVE Final   C Diff interpretation No C. difficile detected.  Final     Time coordinating discharge: Over 30 minutes  SIGNED:   Jacquelin Hawking, MD [facility] 10/01/2016, 12:43 PM Pager [phone]  If 7PM-7AM, please contact night-coverage [email or web address] Password [email or web address]

## 2016-10-02 ENCOUNTER — Ambulatory Visit: Payer: Self-pay

## 2016-10-02 ENCOUNTER — Other Ambulatory Visit: Payer: Self-pay

## 2016-10-02 NOTE — Patient Outreach (Signed)
[facility]) Care Management  [facility] Care Manager  10/02/2016   David Duke [birth year] [id number]  Subjective: "I'm doing good".  Objective: telephonic assessment.  Encounter Medications:  Outpatient Encounter Prescriptions as of 10/02/2016  Medication Sig  . albuterol (PROVENTIL HFA;VENTOLIN HFA) 108 (90 Base) MCG/ACT inhaler Inhale 2 puffs into the lungs every 4 (four) hours as needed for wheezing or shortness of breath (or cough).  [name] atorvastatin (LIPITOR) 20 MG tablet Take 1 tablet (20 mg total) by mouth daily at 6 PM.  . diazepam (VALIUM) 5 MG tablet Take 1 tablet (5 mg total) by mouth every 6 (six) hours as needed for anxiety.  . finasteride (PROSCAR) 5 MG tablet Take 5 mg by mouth daily.  . fluticasone (FLONASE) 50 MCG/ACT nasal spray Place 1 spray into both nostrils daily. (Patient taking differently: Place 1 spray into both nostrils daily as needed for allergies or rhinitis. )  . metroNIDAZOLE (FLAGYL) 500 MG tablet Take 1 tablet (500 mg total) by mouth 3 (three) times daily.  . Multiple Vitamin (MULTIVITAMIN WITH MINERALS) TABS tablet Take 1 tablet by mouth daily.  . ondansetron (ZOFRAN-ODT) 4 MG disintegrating tablet Take 1 tablet (4 mg total) by mouth every 8 (eight) hours as needed for nausea or vomiting.   No facility-administered encounter medications on file as of 10/02/2016.     Functional Status:  In your present state of health, do you have any difficulty performing the following activities: 09/30/2016 08/19/2016  Hearing? David Duke  Vision? N N  Difficulty concentrating or making decisions? N Y  Walking or climbing stairs? N N  Dressing or bathing? N N  Doing errands, shopping? N Y  Quarry manager and eating ? - N  Using the Toilet? - N  In the past six months, have you accidently leaked urine? - Y  Do you have problems with loss of bowel control? - N  Managing your Medications? - N  Managing your Finances? - Y  Housekeeping or managing your  Housekeeping? - Y  Some recent data might be hidden    Fall/Depression Screening: PHQ 2/9 Scores 08/19/2016 07/30/2016  PHQ - 2 Score 0 0    Assessment: Active with telephonic care coordinator. 82 year old male with recent admission for Enterocolitis 1/23-1/24, treated with Metronidazole. History of stroke, dementia, COPD, right eye blindness, Indwelling foley catheter with leg bag. RNCM called for transition of care call. Client confirmed two patient identifiers. Client reports he is feeling much better and Request RNCM discuss discharge instructions with his wife, David Duke. Mrs. David Duke states that client is doing much better. Mrs. David Duke manages client's healthcare, medications/appointments.   Medications reviewed-Wife manages his medications. Denies any question or concerns regarding medications.  Re: follow up appointment-Mrs. David Duke has not called to schedule follow up appointment. RNCM encouraged to call to schedule follow up appointment with primary care. Mrs. David Duke states she will call.  Transportation-Mrs. David Duke states she is unable to drive at this time due to history of seizure, but should be able to drive soon. Currently they rely on friends for transportation. Mrs. David Duke reports reliable transportation and no additional transportation needs at this time.    Neurologist-Dr. Pearlean Duke (next appointment 1/31 with David Duke) Urologist Dr. Pete Duke in [place] (next appointment 1/29 for catheter change)  RNCM noted LCSW trying to reach client. Mrs. David Duke reports she has the number and plans to return call to LCSW for community resources. She expresses an interest in resources for activities for  client outside the home and personal care assistance information.  Noted 9 Emergency room visits and 1 hospitalization in the past 6 months.  Plan: Due to frequent emergency room visits, recent hospitalization as well as history of COPD with recent flare,  RNCM will  refer to [facility] to follow.  David SheriffJuana Hill Mackie, RN, MSN, [facility],CCM [facility] Community Care Coordinator Cell: [phone]

## 2016-10-03 ENCOUNTER — Telehealth: Payer: Self-pay

## 2016-10-03 ENCOUNTER — Other Ambulatory Visit: Payer: Self-pay | Admitting: *Deleted

## 2016-10-03 LAB — GASTROINTESTINAL PANEL BY PCR, STOOL (REPLACES STOOL CULTURE)
ADENOVIRUS F40/41: NOT DETECTED
ASTROVIRUS: NOT DETECTED
CRYPTOSPORIDIUM: NOT DETECTED
CYCLOSPORA CAYETANENSIS: NOT DETECTED
Campylobacter species: NOT DETECTED
ENTAMOEBA HISTOLYTICA: NOT DETECTED
ENTEROAGGREGATIVE E COLI (EAEC): NOT DETECTED
ENTEROPATHOGENIC E COLI (EPEC): NOT DETECTED
Enterotoxigenic E coli (ETEC): NOT DETECTED
GIARDIA LAMBLIA: NOT DETECTED
Norovirus GI/GII: DETECTED — AB
Plesimonas shigelloides: NOT DETECTED
Rotavirus A: NOT DETECTED
Salmonella species: NOT DETECTED
Sapovirus (I, II, IV, and V): NOT DETECTED
Shiga like toxin producing E coli (STEC): NOT DETECTED
Shigella/Enteroinvasive E coli (EIEC): NOT DETECTED
VIBRIO CHOLERAE: NOT DETECTED
VIBRIO SPECIES: NOT DETECTED
YERSINIA ENTEROCOLITICA: NOT DETECTED

## 2016-10-03 NOTE — Patient Outreach (Signed)
[facility]) Care Management  10/03/2016  David BarlowDavid R Koehne 01/12/1935 [id number]   Pt referred for community case management on 1/25 for ongoing transition of care contacts. RN completed an outreach call today however unsuccessful. RN able to leave a HIPAA approved voice message requesting a call back. Will follow up accordingly with additional calls and await a call back from pt for possible services.  Elliot CousinLisa Matthews, RN Care Management Coordinator [facility] Main Office [phone]

## 2016-10-03 NOTE — Progress Notes (Signed)
Received call that patient was positive for the Norovirus. Patient was discharged 1/24 -- paged Dr Caleb PoppNettey to inform him of the critical value.

## 2016-10-03 NOTE — Telephone Encounter (Signed)
Transition Care Management Follow-up Telephone Call  ADMISSION DATE: 09/29/16  DISCHARGE DATE: 10/01/16    How have you been since you were released from the hospital? Wife states he has been better since being out of the hospital. States his stomach feels better.     Do you understand why you were in the hospital? YES   Do you understand the discharge instrcutions? Yes  Items Reviewed:  Medications reviewed:  Medications reviewed with patient. Patient has not been able to pick up medication (Flagyl) from pharmacy because of transportation.    Allergies reviewed: PCN  Dietary changes reviewed:Regular [place] Referrals reviewed: Appointment scheduled for hospital follow up. Functional Questionnaire:   Activities of Daily Living (ADLs):  No help needed at this time   Any transportation issues/concerns?: No   Any patient concerns? Needs pharmacy that delivers medications.    Confirmed importance and date/time of follow-up visits scheduled: Yes     Confirmed with patient if condition begins to worsen call PCP or go to the ER. Yes     Patient was given the Call-a-Nurse line [phone]: Yes

## 2016-10-05 LAB — CULTURE, BLOOD (ROUTINE X 2)
Culture: NO GROWTH
Culture: NO GROWTH

## 2016-10-06 ENCOUNTER — Other Ambulatory Visit: Payer: Self-pay | Admitting: *Deleted

## 2016-10-06 NOTE — Patient Outreach (Signed)
[facility]) Care Management  10/06/2016  David BarlowDavid R Pinkstaff 04/05/1935 [id number]  RN spoke with pt today and introduced the [facility] services and purpose for today's call. Verified identifiers pt requested RN to speak with his wife David Duke([name]). RN spoke with pt's wife and again introduced the [facility] services and purpose for today's call. Wife very busy today and requested RN to call back tomorrow morning concerning services. RN will scheduled a a call back and call back on tomorrow.  Elliot CousinLisa Jaymz Traywick, RN Care Management Coordinator [facility] Main Office [phone]

## 2016-10-07 ENCOUNTER — Other Ambulatory Visit: Payer: Self-pay | Admitting: *Deleted

## 2016-10-07 DIAGNOSIS — J441 Chronic obstructive pulmonary disease with (acute) exacerbation: Secondary | ICD-10-CM

## 2016-10-07 NOTE — Patient Outreach (Signed)
[facility]) Care Management  10/07/2016  David BarlowDavid R Duke 12/26/1934 [id number]  RN spoke with earlier who requested RN to speak with wife David Duke([name]) concerning [facility]. RN again reintroduce the [facility] services and purpose for today's call. Discussion today concerning pt's recent discharge and possible needs in managing his care in the home. Wife states pt has been recovering well since his stroke last year with the only residual being some "cognitive issues and now dementia along with sundowners where he gets agitated". States she has a very supportive friends and family however occasionally transportation issues to his appointments due to limited driving because of her medical issues.  MEDICATIONS: Reports pt has a pill box that she fills weekly and this has been a great help. No reported issues with medications as pt adherent with medications at this time and able to afford all medications. TRANSPORTATION: Wife reports issues with transportation at times. States she on occassions  RN will refer to Child psychotherapistsocial worker for available transportation resources in the area of [place] to assist further if available.  APPOINTMENTS: Wife reports pt did not make it the one of his appointments for an indwelling cath change but rescheduled for a change out on 2/5 wit his urologist. Pt also has a pending appointment with his primary on 2/1 at 1pm however states he will be changing primary providers and will have [facility]. Based upon this information caregiver informed that this clinic was not within network for ongoing [facility] as a [facility] (caregiver with understanding).   No other issues or request to address at this time. Will follow up one additional call on Monday prior to the out of network office visit to his new provider 2/7 prior to closing this case.   Elliot CousinLisa Jeremey Bascom, RN Care Management Coordinator [facility] Main  Office [phone]

## 2016-10-07 NOTE — Patient Outreach (Signed)
[facility]) Care Management  10/07/2016  David BarlowDavid R Duke 08/16/1935 [id number]   RN requested to call back later today. RN attempted outreach call to pt's caregiver as scheduled for this morning however caregiver requested another call back indicating she would not be able to discuss pt's needs at this time. Wife very apologetic and requested a call back this afternoon around 3:00 pm. RN will follow up at the requested time and inquire further on pt's possible needs.   Elliot CousinLisa Roselina Burgueno, RN Care Management Coordinator [facility] Main Office [phone]

## 2016-10-08 ENCOUNTER — Encounter: Payer: Self-pay | Admitting: *Deleted

## 2016-10-08 ENCOUNTER — Other Ambulatory Visit: Payer: Self-pay | Admitting: *Deleted

## 2016-10-08 ENCOUNTER — Ambulatory Visit: Payer: Self-pay | Admitting: Nurse Practitioner

## 2016-10-08 ENCOUNTER — Ambulatory Visit: Payer: Medicare Other | Admitting: Nurse Practitioner

## 2016-10-08 NOTE — Patient Outreach (Signed)
[facility]) Care Management  10/08/2016  David BarlowDavid R Duke 01/05/1935 [id number]  CSW covering today for Ryland GroupJoanna Saporito, LCSW. This CSW received request this morning to assist patient with transportation to upcoming appointments. CSW attempted to reach patient by phone and left a HIPPA compliant message for return call.   Reece LevyJanet Mazie Fencl, MSW, LCSW Clinical Social Worker  [facility] [phone]

## 2016-10-09 ENCOUNTER — Encounter: Payer: Self-pay | Admitting: Nurse Practitioner

## 2016-10-09 ENCOUNTER — Inpatient Hospital Stay: Payer: Medicare Other | Admitting: Medical

## 2016-10-09 ENCOUNTER — Other Ambulatory Visit: Payer: Self-pay | Admitting: *Deleted

## 2016-10-09 ENCOUNTER — Telehealth: Payer: Self-pay | Admitting: Medical

## 2016-10-09 NOTE — Patient Outreach (Signed)
[facility]) Care Management  10/09/2016  David BarlowDavid R Duke 03/14/1935 [id number]  CSW received a new referral on patient from Elliot CousinLisa Matthews, RNCM with [facility]) [facility] Care Management, indicating that patient would benefit from transportation to and from physician appointments.  In CSW's absence on Wednesday, January 31st, CSW's colleague, Reece LevyJanet Caldwell attempted to contact patient to confirm appointment scheduled for today, Thursday, February 1st at 3:00pm with [facility] Esperanza [facility]Edward [facility]; however, patient was unavailable.  CSW also made an initial attempt to try and contact patient today to perform phone assessment, as well as assess and assist with social needs and services, without success.  A HIPAA complaint message was left for patient on voicemail.  CSW is currently awaiting a return call.  If CSW does not receive a return call from patient within the next week, CSW will make a second outreach attempt.  In the meantime, CSW sent an In Basket message to Antonieta IbaKelli Rose, Care Management Assistant with [facility], explaining that patient is in need of transportation assistance today, but that CSW has been unable to engage with patient to confirm the appointment time and date, nor has CSW been able to complete a Financial Assessment Form on patient to ensure eligibility. [name], BSW, MSW, LCSW  Licensed Restaurant manager, fast foodClinical Social Worker  [facility] Care Management [facility]  Mailing [address], [place], [name] [address] Physical Address-[address], [place], [name] [address] Toll Free Main # [phone] Fax # [phone] Cell # [phone]  Office # [phone] [email or web address]

## 2016-10-09 NOTE — Telephone Encounter (Signed)
No charge. 

## 2016-10-09 NOTE — Telephone Encounter (Signed)
Patient transportation (spouse) called cancelling 3pm hospital follow up appointment due to spouse being sick. [facility]. Charge or no charge

## 2016-10-10 ENCOUNTER — Encounter: Payer: Self-pay | Admitting: Medical

## 2016-10-10 ENCOUNTER — Ambulatory Visit (INDEPENDENT_AMBULATORY_CARE_PROVIDER_SITE_OTHER): Payer: Medicare Other | Admitting: Medical

## 2016-10-10 VITALS — BP 110/60 | HR 106 | Temp 97.7°F | Resp 16 | Ht 68.0 in | Wt 138.2 lb

## 2016-10-10 DIAGNOSIS — F419 Anxiety disorder, unspecified: Secondary | ICD-10-CM

## 2016-10-10 DIAGNOSIS — A0811 Acute gastroenteropathy due to Norwalk agent: Secondary | ICD-10-CM | POA: Diagnosis not present

## 2016-10-10 DIAGNOSIS — R5383 Other fatigue: Secondary | ICD-10-CM | POA: Diagnosis not present

## 2016-10-10 LAB — COMPLETE METABOLIC PANEL WITH GFR
ALBUMIN: 4.1 g/dL (ref 3.6–5.1)
ALK PHOS: 80 U/L (ref 40–115)
ALT: 35 U/L (ref 9–46)
AST: 23 U/L (ref 10–35)
BILIRUBIN TOTAL: 0.6 mg/dL (ref 0.2–1.2)
BUN: 16 mg/dL (ref 7–25)
CO2: 28 mmol/L (ref 20–31)
Calcium: 9.3 mg/dL (ref 8.6–10.3)
Chloride: 100 mmol/L (ref 98–110)
Creat: 1.4 mg/dL — ABNORMAL HIGH (ref 0.70–1.18)
GFR, Est African American: 55 mL/min — ABNORMAL LOW (ref >=60)
GFR, Est Non African American: 47 mL/min — ABNORMAL LOW (ref >=60)
GLUCOSE: 82 mg/dL (ref 65–99)
Potassium: 4.4 mmol/L (ref 3.5–5.3)
SODIUM: 137 mmol/L (ref 135–146)
TOTAL PROTEIN: 7 g/dL (ref 6.1–8.1)

## 2016-10-10 LAB — CBC WITH DIFFERENTIAL/PLATELET
BASOS ABS: 106 {cells}/uL (ref 0–200)
BASOS PCT: 1 %
EOS PCT: 2 %
Eosinophils Absolute: 212 cells/uL (ref 15–500)
HCT: 45.2 % (ref 38.5–50.0)
HEMOGLOBIN: 15.2 g/dL (ref 13.2–17.1)
LYMPHS ABS: 2226 {cells}/uL (ref 850–3900)
Lymphocytes Relative: 21 %
MCH: 30.8 pg (ref 27.0–33.0)
MCHC: 33.6 g/dL (ref 32.0–36.0)
MCV: 91.7 fL (ref 80.0–100.0)
MPV: 9.5 fL (ref 7.5–12.5)
Monocytes Absolute: 954 cells/uL — ABNORMAL HIGH (ref 200–950)
Monocytes Relative: 9 %
Neutro Abs: 7102 cells/uL (ref 1500–7800)
Neutrophils Relative %: 67 %
PLATELETS: 353 10*3/uL (ref 140–400)
RBC: 4.93 MIL/uL (ref 4.20–5.80)
RDW: 13.6 % (ref 11.0–15.0)
WBC: 10.6 10*3/uL (ref 3.8–10.8)

## 2016-10-10 NOTE — Progress Notes (Signed)
Subjective:    Patient ID: David Duke, male    DOB: [birth year], 82 y.o.   MRN: [id number]  HPI  Pt in for follow up from hospitalization. Pt had norovirus. He was treated and he eventually got better.  Pt before hospitalization  had fever, vomiting and abdomen pain.(with loose stools). He had wbc 15.1 and + norovirus.   Pt c dif was negative. He was discharge on flagyl but has not gotten it yet. Pt has not picked up zofran.  Pt states he still feels tired afterdischarge from hospital. Decent appetite last night. He has not had any diarrhea since dc.  No black or bloody stool.    Pt does have history of anxiety. He is aware we have dismissed him. He has      Review of Systems  Constitutional: Negative for chills and fatigue.  HENT: Negative for congestion, ear pain, facial swelling, hearing loss, postnasal drip, rhinorrhea and sinus pressure.   Respiratory: Negative for cough, chest tightness, shortness of breath and wheezing.   Gastrointestinal: Negative for abdominal pain, blood in stool, constipation, diarrhea and vomiting.       Mild  Brief epigastric pain last night but not now.  Musculoskeletal: Negative for back pain.  Skin: Negative for rash.  Neurological: Negative for dizziness, numbness and headaches.  Psychiatric/Behavioral: Negative for agitation and confusion. The patient is nervous/anxious.     Past Medical History:  Diagnosis Date  . AKI (acute kidney injury) (HCC)   . Anxiety   . Blind right eye   . BPH (benign prostatic hypertrophy) with urinary retention   . COPD (chronic obstructive pulmonary disease) (HCC)   . Depression   . High cholesterol   . Macular degeneration of both eyes   . Renal disorder   . Shortness of breath dyspnea   . Stroke [facility])      Social History   Social History  . Marital status: Married    Spouse name: N/A  . Number of children: N/A  . Years of education: N/A   Occupational History  . Not on file.   Social  History Main Topics  . Smoking status: Current Every Day Smoker    Packs/day: 0.50    Years: 60.00    Types: Cigarettes  . Smokeless tobacco: Never Used  . Alcohol use No  . Drug use: No  . Sexual activity: Not on file   Other Topics Concern  . Not on file   Social History Narrative  . No narrative on file    Past Surgical History:  Procedure Laterality Date  . NO PAST SURGERIES      Family History  Problem Relation Age of Onset  . Colon cancer Mother   . Cerebral aneurysm Father     Allergies  Allergen Reactions  . Penicillins Hives, Diarrhea and Nausea And Vomiting    Current Outpatient Prescriptions on File Prior to Visit  Medication Sig Dispense Refill  . albuterol (PROVENTIL HFA;VENTOLIN HFA) 108 (90 Base) MCG/ACT inhaler Inhale 2 puffs into the lungs every 4 (four) hours as needed for wheezing or shortness of breath (or cough). 1 Inhaler 1  . atorvastatin (LIPITOR) 20 MG tablet Take 1 tablet (20 mg total) by mouth daily at 6 PM. 30 tablet 2  . diazepam (VALIUM) 5 MG tablet Take 1 tablet (5 mg total) by mouth every 6 (six) hours as needed for anxiety. 20 tablet 0  . finasteride (PROSCAR) 5 MG tablet Take 5 mg by  mouth daily.    . fluticasone (FLONASE) 50 MCG/ACT nasal spray Place 1 spray into both nostrils daily. (Patient taking differently: Place 1 spray into both nostrils daily as needed. )  2  . Multiple Vitamin (MULTIVITAMIN WITH MINERALS) TABS tablet Take 1 tablet by mouth daily.    . ondansetron (ZOFRAN-ODT) 4 MG disintegrating tablet Take 1 tablet (4 mg total) by mouth every 8 (eight) hours as needed for nausea or vomiting. 20 tablet 0   No current facility-administered medications on file prior to visit.     BP 110/60   Pulse (!) 106   Temp 97.7 F (36.5 C) (Oral)   Resp 16   Ht 5\' 8"  (1.727 m)   Wt 138 lb 4 oz (62.7 kg)   SpO2 99%   BMI 21.02 kg/m       Objective:   Physical Exam  General  Mental Status - Alert. General Appearance - Well  groomed. Not in acute distress.  Skin Rashes- No Rashes.  HEENT Head- Normal. Ear Auditory Canal - Left- Normal. Right - Normal.Tympanic Membrane- Left- Normal. Right- Normal. Eye Sclera/Conjunctiva- Left- Normal. Right- Normal. Nose & Sinuses Nasal Mucosa- Left-  Boggy and Congested. Right-  Boggy and  Congested.Bilateral no  maxillary and no  frontal sinus pressure. Mouth & Throat Lips: Upper Lip- Normal: no dryness, cracking, pallor, cyanosis, or vesicular eruption. Lower Lip-Normal: no dryness, cracking, pallor, cyanosis or vesicular eruption. Buccal Mucosa- Bilateral- No Aphthous ulcers. Oropharynx- No Discharge or Erythema. Tonsils: Characteristics- Bilateral- No Erythema or Congestion. Size/Enlargement- Bilateral- No enlargement. Discharge- bilateral-None.  Neck Neck- Supple. No Masses.   Chest and Lung Exam Auscultation: Breath Sounds:-Clear even and unlabored.  Cardiovascular Auscultation:Rythm- Regular, rate and rhythm. Murmurs & Other Heart Sounds:Ausculatation of the heart reveal- No Murmurs.  Lymphatic Head & Neck General Head & Neck Lymphatics: Bilateral: Description- No Localized lymphadenopathy.  Abdomen- soft, nt, nd, +bs, no rebound or guarding and no organomegaly.      Assessment & Plan:  You probably have some mild dehydration post norovirus. I want you to hydrate with propel and eat bland foods. Also please get cbc and cmp.   For [facility](pt has appointment on Wed). If you are able to get your valium needs/anxiety controlled with them I might be able to reverse dismissal and be able to see you only for your medical needs only(not anxiety). I would need to discuss this with by supervisor first.(Note later discussed with Dr. Laury AxonLowne she did not think reversing dismissal would be a good idea).  Follow up as needed  Pt states he lost some anxiety  pills in the hospital. He has some tablets left. [facility].  He is not asking for more today. Pt states  He does have enough tabs to get [facility].

## 2016-10-10 NOTE — Progress Notes (Signed)
Pre visit review using our clinic review tool, if applicable. No additional management support is needed unless otherwise documented below in the visit note/SLS  

## 2016-10-10 NOTE — Patient Instructions (Addendum)
You probably have some mild dehydration post norovirus. I want you to hydrate with propel and eat bland foods. Also please get cbc and cmp.   [facility](pt has appointment on Wed.). If you are able to get your valium needs/anxiety controlled with them I might be able to reverse dismissal and be able to see you only for your medical needs only(not anxiety). I would need to discuss this with by supervisor.  Follow up as needed

## 2016-10-12 ENCOUNTER — Emergency Department (HOSPITAL_BASED_OUTPATIENT_CLINIC_OR_DEPARTMENT_OTHER): Payer: Medicare Other

## 2016-10-12 ENCOUNTER — Emergency Department (HOSPITAL_BASED_OUTPATIENT_CLINIC_OR_DEPARTMENT_OTHER)
Admission: EM | Admit: 2016-10-12 | Discharge: 2016-10-12 | Disposition: A | Discharge status: Home / Self Care | Payer: Medicare Other | Attending: Emergency Medicine | Admitting: Emergency Medicine

## 2016-10-12 ENCOUNTER — Encounter (HOSPITAL_BASED_OUTPATIENT_CLINIC_OR_DEPARTMENT_OTHER): Payer: Self-pay | Admitting: Emergency Medicine

## 2016-10-12 DIAGNOSIS — R1084 Generalized abdominal pain: Secondary | ICD-10-CM | POA: Diagnosis not present

## 2016-10-12 DIAGNOSIS — J449 Chronic obstructive pulmonary disease, unspecified: Secondary | ICD-10-CM | POA: Diagnosis not present

## 2016-10-12 DIAGNOSIS — R1013 Epigastric pain: Secondary | ICD-10-CM | POA: Diagnosis present

## 2016-10-12 DIAGNOSIS — F17210 Nicotine dependence, cigarettes, uncomplicated: Secondary | ICD-10-CM | POA: Diagnosis not present

## 2016-10-12 DIAGNOSIS — Z79899 Other long term (current) drug therapy: Secondary | ICD-10-CM | POA: Insufficient documentation

## 2016-10-12 DIAGNOSIS — R112 Nausea with vomiting, unspecified: Secondary | ICD-10-CM | POA: Insufficient documentation

## 2016-10-12 LAB — COMPREHENSIVE METABOLIC PANEL
ALT: 32 U/L (ref 17–63)
AST: 25 U/L (ref 15–41)
Albumin: 3.7 g/dL (ref 3.5–5.0)
Alkaline Phosphatase: 70 U/L (ref 38–126)
Anion gap: 9 (ref 5–15)
BILIRUBIN TOTAL: 1 mg/dL (ref 0.3–1.2)
BUN: 15 mg/dL (ref 6–20)
CALCIUM: 8.8 mg/dL — AB (ref 8.9–10.3)
CO2: 24 mmol/L (ref 22–32)
CREATININE: 1.23 mg/dL (ref 0.61–1.24)
Chloride: 105 mmol/L (ref 101–111)
GFR calc Af Amer: >60 mL/min (ref >60)
GFR, EST NON AFRICAN AMERICAN: 54 mL/min — AB (ref >60)
Glucose, Bld: 114 mg/dL — ABNORMAL HIGH (ref 65–99)
POTASSIUM: 3.8 mmol/L (ref 3.5–5.1)
Sodium: 138 mmol/L (ref 135–145)
TOTAL PROTEIN: 6.9 g/dL (ref 6.5–8.1)

## 2016-10-12 LAB — CBC WITH DIFFERENTIAL/PLATELET
BASOS ABS: 0 10*3/uL (ref 0.0–0.1)
Basophils Relative: 0 %
Eosinophils Absolute: 0.2 10*3/uL (ref 0.0–0.7)
Eosinophils Relative: 2 %
HEMATOCRIT: 43.1 % (ref 39.0–52.0)
Hemoglobin: 14.5 g/dL (ref 13.0–17.0)
LYMPHS PCT: 15 %
Lymphs Abs: 1.7 10*3/uL (ref 0.7–4.0)
MCH: 31 pg (ref 26.0–34.0)
MCHC: 33.6 g/dL (ref 30.0–36.0)
MCV: 92.3 fL (ref 78.0–100.0)
MONO ABS: 0.9 10*3/uL (ref 0.1–1.0)
MONOS PCT: 8 %
NEUTROS ABS: 8.6 10*3/uL — AB (ref 1.7–7.7)
Neutrophils Relative %: 75 %
Platelets: 297 10*3/uL (ref 150–400)
RBC: 4.67 MIL/uL (ref 4.22–5.81)
RDW: 13.5 % (ref 11.5–15.5)
WBC: 11.5 10*3/uL — ABNORMAL HIGH (ref 4.0–10.5)

## 2016-10-12 LAB — LIPASE, BLOOD: LIPASE: 31 U/L (ref 11–51)

## 2016-10-12 MED ORDER — LIDOCAINE VISCOUS 2 % MT SOLN
15.0000 mL | Freq: Once | OROMUCOSAL | Status: AC
  Administered 2016-10-12: 15 mL via OROMUCOSAL
  Filled 2016-10-12: qty 15

## 2016-10-12 MED ORDER — ALUM & MAG HYDROXIDE-SIMETH 200-200-20 MG/5ML PO SUSP
15.0000 mL | Freq: Once | ORAL | Status: AC
  Administered 2016-10-12: 15 mL via ORAL
  Filled 2016-10-12: qty 30

## 2016-10-12 MED ORDER — IOPAMIDOL (ISOVUE-300) INJECTION 61%
100.0000 mL | Freq: Once | INTRAVENOUS | Status: AC | PRN
  Administered 2016-10-12: 100 mL via INTRAVENOUS

## 2016-10-12 MED ORDER — SODIUM CHLORIDE 0.9 % IV BOLUS (SEPSIS)
1000.0000 mL | Freq: Once | INTRAVENOUS | Status: AC
  Administered 2016-10-12: 1000 mL via INTRAVENOUS

## 2016-10-12 MED ORDER — ONDANSETRON HCL 4 MG/2ML IJ SOLN
4.0000 mg | Freq: Once | INTRAMUSCULAR | Status: AC
  Administered 2016-10-12: 4 mg via INTRAVENOUS
  Filled 2016-10-12: qty 2

## 2016-10-12 MED ORDER — ONDANSETRON 4 MG PO TBDP: 4.0000 mg | ORAL_TABLET | Freq: Three times a day (TID) | ORAL | 0 refills | Status: DC | PRN

## 2016-10-12 NOTE — ED Notes (Signed)
ED Provider at bedside. 

## 2016-10-12 NOTE — Discharge Instructions (Signed)
Try zantac 150mg twice a day.  ° ° °

## 2016-10-12 NOTE — ED Notes (Signed)
Patient transported to CT 

## 2016-10-12 NOTE — ED Notes (Signed)
Pt and family arguing, cursing at/with each other.

## 2016-10-12 NOTE — ED Triage Notes (Signed)
Pt presents to ED with complaints of lower abdominal pain since Friday.

## 2016-10-12 NOTE — ED Provider Notes (Signed)
[facility] Provider Note   CSN: [id number] Arrival date & time: 10/12/16  4098     History   Chief Complaint Chief Complaint  Patient presents with  . Abdominal Pain    HPI David Duke is a 82 y.o. male.  82 yo M with a cc of abdominal pain.  Going on since this morning.  Diffuse burning pain worst to the lower quadrants.  Colicky.  Eating makes it worse.  Had a belch that left him burning up into his neck.  Denies exertional symptoms.  Denies fevers.  Recently with norovirus.     The history is provided by the patient and the spouse.  Abdominal Pain   This is a new problem. The current episode started 12 to 24 hours ago. The problem occurs constantly. The problem has not changed since onset.The pain is associated with eating. The pain is located in the epigastric region. The quality of the pain is aching. The pain is at a severity of 7/10. The pain is moderate. Associated symptoms include nausea and vomiting. Pertinent negatives include fever, diarrhea, headaches, arthralgias and myalgias. The symptoms are aggravated by eating. Nothing relieves the symptoms.    Past Medical History:  Diagnosis Date  . AKI (acute kidney injury) (HCC)   . Anxiety   . Blind right eye   . BPH (benign prostatic hypertrophy) with urinary retention   . COPD (chronic obstructive pulmonary disease) (HCC)   . Depression   . High cholesterol   . Macular degeneration of both eyes   . Renal disorder   . Shortness of breath dyspnea   . Stroke [facility])     Patient Active Problem List   Diagnosis Date Noted  . Enterocolitis 09/30/2016  . HLD (hyperlipidemia) 09/30/2016  . Tobacco abuse 09/30/2016  . Nausea vomiting and diarrhea 09/30/2016  . History of stroke in prior 3 months 01/22/2016  . Chronic tension-type headache, not intractable   . Acute frontal sinusitis   . 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 Gait disturbance, post-stroke   . Ataxia, late  effect of cerebrovascular disease   . BPH (benign prostatic hyperplasia)   . Urinary retention   . Adjustment disorder with mixed anxiety and depressed mood   . Chronic obstructive pulmonary disease (HCC)   . Cephalalgia   . AKI (acute kidney injury) (HCC)   . Macular degeneration   . Prediabetes   . Hypokalemia   . Absolute anemia   . Aphasia   . Acute encephalopathy 11/25/2015  . Hyponatremia 11/25/2015  . Acute kidney injury (HCC) 11/25/2015  . Depression   . Anxiety   . COPD (chronic obstructive pulmonary disease) (HCC)   . ICH (intracerebral hemorrhage) (HCC) 11/22/2015    Past Surgical History:  Procedure Laterality Date  . NO PAST SURGERIES         Home Medications    Prior to Admission medications   Medication Sig Start Date End Date Taking? Authorizing Provider  albuterol (PROVENTIL HFA;VENTOLIN HFA) 108 (90 Base) MCG/ACT inhaler Inhale 2 puffs into the lungs every 4 (four) hours as needed for wheezing or shortness of breath (or cough). 09/23/16   Ramon Dredge Saguier, PA-C  atorvastatin (LIPITOR) 20 MG tablet Take 1 tablet (20 mg total) by mouth daily at 6 PM. 09/03/16   Esperanza Richters, PA-C  diazepam (VALIUM) 5 MG tablet Take 1 tablet (5 mg total) by mouth every 6 (six) hours as needed for anxiety. 09/25/16  John Molpus, MD  finasteride (PROSCAR) 5 MG tablet Take 5 mg by mouth daily. 09/22/16   Historical Provider, MD  fluticasone (FLONASE) 50 MCG/ACT nasal spray Place 1 spray into both nostrils daily. Patient taking differently: Place 1 spray into both nostrils daily as needed.  12/11/15   Jacquelynn Cree, PA-C  Multiple Vitamin (MULTIVITAMIN WITH MINERALS) TABS tablet Take 1 tablet by mouth daily.    Historical Provider, MD  ondansetron (ZOFRAN-ODT) 4 MG disintegrating tablet Take 1 tablet (4 mg total) by mouth every 8 (eight) hours as needed for nausea or vomiting. 10/12/16   Melene Plan, DO    Family History Family History  Problem Relation Age of Onset  . Colon cancer  Mother   . Cerebral aneurysm Father     Social History Social History  Substance Use Topics  . Smoking status: Current Every Day Smoker    Packs/day: 0.50    Years: 60.00    Types: Cigarettes  . Smokeless tobacco: Never Used  . Alcohol use No     Allergies   Penicillins   Review of Systems Review of Systems  Constitutional: Negative for chills and fever.  HENT: Negative for congestion and facial swelling.   Eyes: Negative for discharge and visual disturbance.  Respiratory: Negative for shortness of breath.   Cardiovascular: Negative for chest pain and palpitations.  Gastrointestinal: Positive for abdominal pain, nausea and vomiting. Negative for diarrhea.  Musculoskeletal: Negative for arthralgias and myalgias.  Skin: Negative for color change and rash.  Neurological: Negative for tremors, syncope and headaches.  Psychiatric/Behavioral: Negative for confusion and dysphoric mood.     Physical Exam Updated Vital Signs BP 121/74 (BP Location: Right Arm)   Pulse 99   Temp 97.4 F (36.3 C) (Oral)   Resp 20   Ht 5\' 8"  (1.727 m)   Wt 138 lb (62.6 kg)   SpO2 100%   BMI 20.98 kg/m   Physical Exam  Constitutional: He is oriented to person, place, and time. He appears well-developed and well-nourished.  HENT:  Head: Normocephalic and atraumatic.  Eyes: EOM are normal. Pupils are equal, round, and reactive to light.  Neck: Normal range of motion. Neck supple. No JVD present.  Cardiovascular: Normal rate and regular rhythm.  Exam reveals no gallop and no friction rub.   No murmur heard. Pulmonary/Chest: No respiratory distress. He has no wheezes.  Abdominal: He exhibits no distension and no mass. There is tenderness (worst to the lower abdomen). There is no rebound and no guarding.  Musculoskeletal: Normal range of motion.  Neurological: He is alert and oriented to person, place, and time.  Skin: No rash noted. No pallor.  Psychiatric: He has a normal mood and affect.  His behavior is normal.  Nursing note and vitals reviewed.    ED Treatments / Results  Labs (all labs ordered are listed, but only abnormal results are displayed) Labs Reviewed  CBC WITH DIFFERENTIAL/PLATELET - Abnormal; Notable for the following:       Result Value   WBC 11.5 (*)    Neutro Abs 8.6 (*)    All other components within normal limits  COMPREHENSIVE METABOLIC PANEL - Abnormal; Notable for the following:    Glucose, Bld 114 (*)    Calcium 8.8 (*)    GFR calc non Af Amer 54 (*)    All other components within normal limits  LIPASE, BLOOD    EKG  EKG Interpretation None       Radiology Ct  Abdomen Pelvis W Contrast  Result Date: 10/12/2016 CLINICAL DATA:  Abdominal pain.  Recent diagnosis of norovirus. EXAM: CT ABDOMEN AND PELVIS WITH CONTRAST TECHNIQUE: Multidetector CT imaging of the abdomen and pelvis was performed using the standard protocol following bolus administration of intravenous contrast. CONTRAST:  100mL ISOVUE-300 IOPAMIDOL (ISOVUE-300) INJECTION 61% COMPARISON:  CT 10 days prior 09/29/2016 FINDINGS: Lower chest: Right basilar scarring. No consolidation. No pleural fluid. Calcified lymph node adjacent of the distal esophagus again seen. Hepatobiliary: Stable subcentimeter hepatic hypodensities. No new hepatic lesion. Gallbladder physiologically distended, no calcified stone. No biliary dilatation. Pancreas: No ductal dilatation or inflammation. Scattered parenchymal calcifications. Spleen: Normal in size without focal abnormality. Adrenals/Urinary Tract: Normal adrenal glands. Prominence of the renal collecting systems but no frank hydronephrosis. There is a Foley catheter within the urinary bladder. Indeterminate 2.1 cm exophytic lesion from the mid left kidney is unchanged, with smaller cortical hypodensities. Stomach/Bowel: Stomach is decompressed, question of gastric wall thickening. Duodenum is fluid-filled. Decreased fluid distention of more distal small  bowel from prior exam. Enteric contrast is seen to the level the colon. Previous fluid-filled colon has resolved, moderate stool burden. Significant diverticulosis involving the descending and sigmoid colon without acute diverticulitis. Normal appendix. Vascular/Lymphatic: Infrarenal abdominal aortic aneurysm maximal dimension 4.3 cm, unchanged from prior exam. There is eccentric mural thrombus. No periaortic soft tissue stranding. 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 No adenopathy. Reproductive: Enlarged prostate gland. Other: Fat within both inguinal canals. No free air, free fluid, or intra-abdominal fluid collection. Musculoskeletal: There are no acute or suspicious osseous abnormalities. IMPRESSION: 1. Near completely resolved fluid-filled bowel compared to prior exam. Overall improved bowel inflammation. 2. No new abnormality. Colonic diverticulosis without acute inflammation. 3. Unchanged infrarenal abdominal aortic aneurysm, maximal dimension 4.3 cm. Recommend followup by ultrasound in 1 year. 4. Indeterminate exophytic left renal lesion. CT or MRI characterization without with contrast is recommended, on a nonemergent basis. Electronically Signed   By: Rubye OaksMelanie  Ehinger M.D.   On: 10/12/2016 05:58    Procedures Procedures (including critical care time)  Medications Ordered in ED Medications  sodium chloride 0.9 % bolus 1,000 mL (0 mLs Intravenous Stopped 10/12/16 0527)  ondansetron (ZOFRAN) injection 4 mg (4 mg Intravenous Given 10/12/16 0443)  alum & mag hydroxide-simeth (MAALOX/MYLANTA) 200-200-20 MG/5ML suspension 15 mL (15 mLs Oral Given 10/12/16 0443)  lidocaine (XYLOCAINE) 2 % viscous mouth solution 15 mL (15 mLs Mouth/Throat Given 10/12/16 0443)  iopamidol (ISOVUE-300) 61 % injection 100 mL (100 mLs Intravenous Contrast Given 10/12/16 0529)     Initial Impression / Assessment and Plan / ED Course  I have reviewed the triage vital signs and the nursing  notes.  Pertinent labs & imaging results that were available during my care of the patient were reviewed by me and considered in my medical decision making (see chart for details).     82 yo M with abdominal pain.  Will ct, labs. CT with resolution of inflammation from prior CT.  AAA at baseline.  Discussed results of CT including AAA and left renal finding, patient asymptomatic after fluids, zofran, GI cocktail.  D/c home.   6:30 AM:  I have discussed the diagnosis/risks/treatment options with the patient and family and believe the pt to be eligible for discharge home to follow-up with PCP. We also discussed returning to the ED immediately if new or worsening sx occur. We discussed the sx which are most concerning (e.g., sudden worsening pain, fever, inability to tolerate by mouth)  that necessitate immediate return. Medications administered to the patient during their visit and any new prescriptions provided to the patient are listed below.  Medications given during this visit Medications  sodium chloride 0.9 % bolus 1,000 mL (0 mLs Intravenous Stopped 10/12/16 0527)  ondansetron (ZOFRAN) injection 4 mg (4 mg Intravenous Given 10/12/16 0443)  alum & mag hydroxide-simeth (MAALOX/MYLANTA) 200-200-20 MG/5ML suspension 15 mL (15 mLs Oral Given 10/12/16 0443)  lidocaine (XYLOCAINE) 2 % viscous mouth solution 15 mL (15 mLs Mouth/Throat Given 10/12/16 0443)  iopamidol (ISOVUE-300) 61 % injection 100 mL (100 mLs Intravenous Contrast Given 10/12/16 0529)     The patient appears reasonably screen and/or stabilized for discharge and I doubt any other medical condition or other [facility] requiring further screening, evaluation, or treatment in the ED at this time prior to discharge.    Final Clinical Impressions(s) / ED Diagnoses   Final diagnoses:  Generalized abdominal pain  Nausea and vomiting, intractability of vomiting not specified, unspecified vomiting type    New Prescriptions Discharge Medication List  as of 10/12/2016  6:15 AM       Melene Plan, DO 10/12/16 0630

## 2016-10-12 NOTE — ED Notes (Signed)
Pt discharged to home with wife, NAD.

## 2016-10-13 ENCOUNTER — Other Ambulatory Visit: Payer: Self-pay | Admitting: *Deleted

## 2016-10-13 ENCOUNTER — Encounter: Payer: Self-pay | Admitting: *Deleted

## 2016-10-13 NOTE — Progress Notes (Signed)
Normal Results; Letter Mailed/SLS 02/05 

## 2016-10-13 NOTE — Patient Outreach (Signed)
[facility]) Care Management  10/13/2016  Marcell BarlowDavid R Alexie 03/30/1935 [id number]   RN outreached to pt today however unsuccessful. RN able to leave a HIPAA approved voice message requesting a call back. Will follow up accordingly with hi pending visit [facility]. Note ongoing transition of care contacts until pt has officially changed providers to the out-of-network clinic. Will reschedule today's follow up call to later in this week.  Elliot CousinLisa Maurilio Puryear, RN Care Management Coordinator [facility] Main Office [phone]

## 2016-10-13 NOTE — Patient Outreach (Signed)
[facility]) Care Management  10/13/2016  Marcell BarlowDavid R Jakob 02/10/1935 [id number]   Addendum note: [facility] 10/02/2016.   This RN CM removed from Care Team.  [facility] 10/02/2016.     Simmie Daviesrystal Orhan Mayorga, MSHL, BSN, RN, CCM  [facility] Care Management Care Management Coordinator [phone] Direct [phone] Cell [phone] Office [phone] Fax [email or web address]

## 2016-10-14 ENCOUNTER — Other Ambulatory Visit: Payer: Self-pay | Admitting: Internal Medicine

## 2016-10-14 ENCOUNTER — Other Ambulatory Visit: Payer: Self-pay | Admitting: *Deleted

## 2016-10-14 NOTE — Patient Outreach (Signed)
[facility]) Care Management  10/14/2016  David BarlowDavid R Duke 08/27/1935 [id number]   RN received a call from pt's consented spouse David Duke([name]) who indicates pt had a ED visit on Saturday discharged on Sunday (records indicate a 2 hour visit). States pt was having abdominal pain however scans were all clear from any abnormalities or obstructions. Caregiver also states after having a discussion with the pt they have decided to remain with Dr. Alvira MondaySaguier as their primary provider which will keep them in network for [facility]. RN offered a home visit as caregiver agreed to a visit Thursday mid afternoon. RN also confirmed pt remains in need of community resources with the pending social worker consult [name]([name]) who has outreached to the pt already. RN will confirm and update social worker accordingly with this information.   RN reviewed the discharge orders as reminded caregiver to follow up with pt's primary provider within 7-14 days for possible office visit if needed based upon pt's recent ED visit. RN reviewed all orders and medications which were confirmed. Discussed a plan of care related to follow up visits and medication adherence along with hospitalization prevention measures.   Patient was recently discharged from hospital and all medications have been reviewed.  Elliot CousinLisa Iysis Germain, RN Care Management Coordinator [facility] Main Office [phone]

## 2016-10-14 NOTE — Telephone Encounter (Signed)
Spouse would like to speak with nurse regarding message below, please advise

## 2016-10-15 ENCOUNTER — Other Ambulatory Visit: Payer: Self-pay | Admitting: *Deleted

## 2016-10-15 NOTE — Patient Outreach (Signed)
[facility]) Care Management  10/15/2016  Marcell BarlowDavid R Terrio 07/13/1935 [id number]   CSW made a second attempt to try and contact patient today to perform phone assessment, as well as assess and assist with social work needs and services, without success.  A HIPAA compliant message was left for patient on voicemail.  CSW is currently awaiting a return call.  CSW will make a third and final outreach attempt in one week, if CSW does not receive a return call from patient in the meantime. [name], BSW, MSW, LCSW  Licensed Restaurant manager, fast foodClinical Social Worker  [facility] [place] [facility]  Mailing [place], [place], [name] [address] Physical Address-[address], [place], [name] [address] Toll Free Main # [phone] Fax # [phone] Cell # [phone]  Office # [phone] [email or web address]

## 2016-10-16 ENCOUNTER — Other Ambulatory Visit: Payer: Self-pay | Admitting: *Deleted

## 2016-10-16 ENCOUNTER — Ambulatory Visit: Payer: Self-pay | Admitting: *Deleted

## 2016-10-16 NOTE — Patient Outreach (Signed)
[facility]N) Care Management  10/16/2016  Marcell BarlowDavid R Catala 10/15/1934 [id number]   RN received a call from pt's spouse Rozell Searing(Sabina) who requested to reschedule today's scheduled home visit to next Tuesday after pt's post-op hospital appointment with Dr. Alvira MondaySaguier on Monday. Caregiver indicates she may have "a bug" and not feeling to well today for the home visit. Thinks it may be a 24 hour thing but just wanted to be safe. RN will follow up with the initial home visit on next Tuesday as requested. If any additional changes caregiver aware to contact this RN case manager directly.  Elliot CousinLisa Treyven Lafauci, RN Care Management Coordinator [facility] Main Office [phone]

## 2016-10-20 ENCOUNTER — Encounter: Payer: Self-pay | Admitting: Medical

## 2016-10-20 ENCOUNTER — Ambulatory Visit (INDEPENDENT_AMBULATORY_CARE_PROVIDER_SITE_OTHER): Payer: Medicare Other | Admitting: Medical

## 2016-10-20 ENCOUNTER — Other Ambulatory Visit: Payer: Self-pay | Admitting: *Deleted

## 2016-10-20 ENCOUNTER — Encounter: Payer: Self-pay | Admitting: *Deleted

## 2016-10-20 VITALS — BP 131/82 | HR 98 | Temp 98.0°F | Resp 16 | Ht 68.0 in | Wt 137.2 lb

## 2016-10-20 DIAGNOSIS — F419 Anxiety disorder, unspecified: Secondary | ICD-10-CM | POA: Diagnosis not present

## 2016-10-20 DIAGNOSIS — G8918 Other acute postprocedural pain: Secondary | ICD-10-CM | POA: Diagnosis not present

## 2016-10-20 DIAGNOSIS — R109 Unspecified abdominal pain: Secondary | ICD-10-CM | POA: Diagnosis not present

## 2016-10-20 DIAGNOSIS — N289 Disorder of kidney and ureter, unspecified: Secondary | ICD-10-CM

## 2016-10-20 MED ORDER — DIAZEPAM 5 MG PO TABS: 5.0000 mg | ORAL_TABLET | Freq: Three times a day (TID) | ORAL | 0 refills | Status: DC | PRN

## 2016-10-20 NOTE — Telephone Encounter (Signed)
Pt has been dismissed. The letter has been sent approaching about 1 month ago. He keeps coming in wanting valium. He has violated the  Controlled med contract. I can not see him past the 33 days the dismissal letter was sent out. So make sure he is no longer scheduled past that date. If he is scheduled with me past that and I see him on schedule then may be problem since if he shows up  for office visit as I will likely tell front staff to turn him away. So best to notify him on the phone when he calls.  His wife metioned they never got the letter. But over past month I had talked with her about him having to find new provider and she indicated they got letter. I know sometimes pt never respond to letter or never signs if we send out return receipt request.

## 2016-10-20 NOTE — Patient Outreach (Signed)
[facility]) Care Management  10/20/2016  Marcell BarlowDavid R Kniskern 02/10/1935 [id number]   RN spoke with pt's caregiver today who indicates pt is doing "very well" and will follow up with Dr. Alvira MondaySaguier post hospital visit on today. States pt has been attending all medical appointments with no problems however concerning medications pt has been refusing to take his Lipitor indicating it "upsets his stomach". RN inquired eating food with this medication however pt has informed his wife that his food "taste funny" and again refuses to continue this mediation over the last week. RN strongly encouraged caregiver to inform the pt's provider on today's office visit and allow the provider to stress the risk involved with not taking this medication. Caregiver reports pt has good coordination with the mobility and "gets around really really well" (no PT services needed at this time). Reminded pt of the scheduled home visit on tomorrow. Will follow up accordingly with review of the plan of care and provided the needed educational information related to his ongoing issues.   Elliot CousinLisa Jannine Abreu, RN Care Management Coordinator [facility] Main Office [phone]

## 2016-10-20 NOTE — Progress Notes (Signed)
Pre visit review using our clinic review tool, if applicable. No additional management support is needed unless otherwise documented below in the visit note/SLS Patient is asking to receive controlled substance [Diazepam] today, informed him that [facility] no longer prescribes his controlled medications after his dismissal; pt is confused and states that "he changed his mind and wants to stay with [facility]". [patient was to establish with [name]]. Explained that a reversal of dismissal is not usually given and that provider will discuss with him.

## 2016-10-20 NOTE — Patient Outreach (Signed)
[facility]N) Care Management  10/20/2016  David BarlowDavid R Duke 01/17/1935 [id number]   CSW made a third and final attempt to try and contact patient today to perform phone assessment, as well as assess and assist with social work needs and services, without success.  A HIPAA compliant message was left for patient on voicemail.  CSW  continues to await a return call.  CSW will mail an outreach letter to patient's home, encouraging patient to contact CSW at their earliest convenience, if patient is interested in receiving social work services through CSW with [facility].  If CSW does not receive a return call from patient within the next 10 business days, CSW will proceed with case closure.  Required number of phone attempts will have been made and outreach letter mailed.  CSW will converse with David CousinLisa Duke, RNCM with [facility], also assigned to patient's care, to inform her of CSW's inability to make contact with patient and/or patient's wife, David EkSabrina Duke. David BadJoanna Duke, David Duke, David Duke, David Duke  Licensed Restaurant manager, fast foodClinical Social Worker  [facility] Care Management [name]  Mailing [place] [address], [place], [name] [address] Physical Address-[address], [place], [name] Toll Free Main # [phone] Fax # [phone] Cell # [phone]  Office # [phone] [email or web address]

## 2016-10-20 NOTE — Patient Instructions (Addendum)
For your anxiety I am going to write you another limited number of valium but will only be able to fill on October 24, 2016 and only for 21 tabs. We sent the dismissal letter around September 26, 2016. Any reversal would have to be authorized by Supervising MD.   Your abdomen pain is resolved. If returns let us know.  I am putting in your CT abd/pelvis order with contrast to evaluate a renal lesion found on recent imaging.  Follow up as needed.(per dismissal letter guidelines)

## 2016-10-20 NOTE — Progress Notes (Signed)
Subjective:    Patient ID: David Duke, male    DOB: 1935/03/14, 82 y.o.   MRN: [id number]  HPI  Pt is in for follow up on his anxiety. He is aware that he is being dismissed for violating/overusing valium. He has about 10 days at most left on his controlled med contract.   Pt wife had cancelled the appointment to [facility]. He was advised to restablish with new pcp as he and wife were aware of the dismissal letter we sent.   He was seen in ED far abdomen pain. He had abdomen pain that may have been viral relatd. He is now having no pain. Eating well. The other day at a cheeseburger.  Pt has stable infrarenal AAA.  Pt wife indicated that she was aware that they got the letter on last visit when we discussed. But wife says she never got letter today.  But we had talked about dismissal on his prior conversation I got the definite.  impression she was full aware and had received the letter.  Pt has 3 days left of valium left.   Review of Systems  Constitutional: Negative for chills, fatigue and fever.  HENT: Negative for congestion and drooling.   Respiratory: Negative for cough, chest tightness, shortness of breath and wheezing.   Cardiovascular: Negative for chest pain and palpitations.  Gastrointestinal: Negative for abdominal distention, abdominal pain, blood in stool, constipation, diarrhea, rectal pain and vomiting.  Genitourinary: Negative for dysuria.  Musculoskeletal: Negative for back pain.  Skin: Negative for rash.  Neurological: Negative for dizziness, tremors, weakness and headaches.  Hematological: Negative for adenopathy. Does not bruise/bleed easily.  Psychiatric/Behavioral: Negative for behavioral problems, confusion, sleep disturbance and suicidal ideas. The patient is nervous/anxious.     Past Medical History:  Diagnosis Date  . AKI (acute kidney injury) (HCC)   . Anxiety   . Blind right eye   . BPH (benign prostatic hypertrophy) with urinary retention   .  COPD (chronic obstructive pulmonary disease) (HCC)   . Depression   . High cholesterol   . Macular degeneration of both eyes   . Renal disorder   . Shortness of breath dyspnea   . Stroke [facility])      Social History   Social History  . Marital status: Married    Spouse name: N/A  . Number of children: N/A  . Years of education: N/A   Occupational History  . Not on file.   Social History Main Topics  . Smoking status: Current Every Day Smoker    Packs/day: 0.50    Years: 60.00    Types: Cigarettes  . Smokeless tobacco: Never Used  . Alcohol use No  . Drug use: No  . Sexual activity: Not on file   Other Topics Concern  . Not on file   Social History Narrative  . No narrative on file    Past Surgical History:  Procedure Laterality Date  . NO PAST SURGERIES      Family History  Problem Relation Age of Onset  . Colon cancer Mother   . Cerebral aneurysm Father     Allergies  Allergen Reactions  . Penicillins Hives, Diarrhea and Nausea And Vomiting    Current Outpatient Prescriptions on File Prior to Visit  Medication Sig Dispense Refill  . albuterol (PROVENTIL HFA;VENTOLIN HFA) 108 (90 Base) MCG/ACT inhaler Inhale 2 puffs into the lungs every 4 (four) hours as needed for wheezing or shortness of breath (or cough). 1  Inhaler 1  . atorvastatin (LIPITOR) 20 MG tablet Take 1 tablet (20 mg total) by mouth daily at 6 PM. 30 tablet 2  . diazepam (VALIUM) 5 MG tablet Take 1 tablet (5 mg total) by mouth every 6 (six) hours as needed for anxiety. 20 tablet 0  . finasteride (PROSCAR) 5 MG tablet Take 5 mg by mouth daily.    . fluticasone (FLONASE) 50 MCG/ACT nasal spray Place 1 spray into both nostrils daily. (Patient taking differently: Place 1 spray into both nostrils daily as needed. )  2  . Multiple Vitamin (MULTIVITAMIN WITH MINERALS) TABS tablet Take 1 tablet by mouth daily.    . ondansetron (ZOFRAN-ODT) 4 MG disintegrating tablet Take 1 tablet (4 mg total) by mouth  every 8 (eight) hours as needed for nausea or vomiting. 20 tablet 0   No current facility-administered medications on file prior to visit.     BP 131/82 (BP Location: Left Arm, Patient Position: Sitting, Cuff Size: Normal)   Pulse 98   Temp 98 F (36.7 C) (Oral)   Resp 16   Ht 5\' 8"  (1.727 m)   Wt 137 lb 4 oz (62.3 kg)   SpO2 99%   BMI 20.87 kg/m       Objective:   Physical Exam   General Mental Status- Alert. General Appearance- Not in acute distress. Appears anxious  Skin General: Color- Normal Color. Moisture- Normal Moisture.  Neck Carotid Arteries- Normal color. Moisture- Normal Moisture. No carotid bruits. No JVD.  Chest and Lung Exam Auscultation: Breath Sounds:-Normal.  Cardiovascular Auscultation:Rythm- Regular. Murmurs & Other Heart Sounds:Auscultation of the heart reveals- No Murmurs.  Abdomen Inspection:-Inspeection Normal. Palpation/Percussion:Note:No mass. Palpation and Percussion of the abdomen reveal- Non Tender, Non Distended + BS, no rebound or guarding.   Neurologic Cranial Nerve exam:- CN III-XII intact(No nystagmus), symmetric smile. Strength:- 5/5 equal and symmetric strength both upper and lower extremities.     Assessment & Plan:  For your anxiety I am going to write you another limited number of valium but will only be able to fill on October 24, 2016 and only for 21 tabs. We sent the dismissal letter around September 26, 2016. Any reversal would have to be authorized by Supervising MD.   Your abdomen pain is resolved. If returns let us know.  I am putting in your CT abd/pelvis order with contrast to evaluate a renal lesion found on recent imaging.  Follow up as needed.  Note Dr. Laury AxonLowne aware of pt violation of contract and I am not reversing his dismissal.

## 2016-10-21 ENCOUNTER — Telehealth: Payer: Self-pay

## 2016-10-21 ENCOUNTER — Other Ambulatory Visit: Payer: Self-pay | Admitting: *Deleted

## 2016-10-21 NOTE — Patient Outreach (Signed)
[facility]) Care Management   10/21/2016  ADRIANE GUGLIELMO 1935/01/12 [id number]  David Duke is an 82 y.o. male  Subjective:  Post op STROKE (CAD): Pt reports he is doing well and his caregiver spouse continues to indicate pt is doing very well with his ongoing recovery. No residual symptoms reported at this time as pt continues to ambulate safety around his home. Pt states he continues to ambulate without any problems. Spouse reports she received the [facility] calendar and very appreciative for the information provided in the book.  MEDIATIONS: Reports he has all his medications with no needed refills. No delays reported on administrating his medications as indicated by his spouse.  MEDICAL APPOINTMENTS: Reports attending all medical appointments with sufficient transportation when needed. However pt indicates he may be losing his car soon due to non-payment. Wife states another source of transportation through a church member who has started a a UBER service that they maybe able to use.  SMOKING: Pt enjoys smoking and does not wish to quit at this time however reports he only smoke 3 cigarettes daily. Pt is aware of the risk involved concerning medical issues that can occur.   Objective:   Review of Systems  Constitutional: Negative.   HENT: Negative.   Eyes: Negative.   Respiratory: Negative.   Cardiovascular: Negative.   Gastrointestinal: Negative.   Genitourinary: Negative.   Musculoskeletal: Negative.   Skin: Negative.   Neurological: Negative.   Endo/Heme/Allergies: Negative.   Psychiatric/Behavioral: Negative.     Physical Exam  Constitutional: He is oriented to person, place, and time. He appears well-developed and well-nourished.  HENT:  Right Ear: External ear normal.  Left Ear: External ear normal.  Eyes: EOM are normal.  Neck: Normal range of motion.  Cardiovascular: Normal heart sounds.   Respiratory: Effort normal and breath sounds normal.  GI:  Soft. Bowel sounds are normal.  Musculoskeletal: Normal range of motion.  Neurological: He is alert and oriented to person, place, and time.  Skin: Skin is warm and dry.  Psychiatric: He has a normal mood and affect. His behavior is normal.  History of Dementia    Encounter Medications:   Outpatient Encounter Prescriptions as of 10/21/2016  Medication Sig  . albuterol (PROVENTIL HFA;VENTOLIN HFA) 108 (90 Base) MCG/ACT inhaler Inhale 2 puffs into the lungs every 4 (four) hours as needed for wheezing or shortness of breath (or cough).  [name] atorvastatin (LIPITOR) 20 MG tablet Take 1 tablet (20 mg total) by mouth daily at 6 PM.  . diazepam (VALIUM) 5 MG tablet Take 1 tablet (5 mg total) by mouth every 8 (eight) hours as needed for anxiety. Can fill no sooner than October 24, 2016.  . finasteride (PROSCAR) 5 MG tablet Take 5 mg by mouth daily.  . fluticasone (FLONASE) 50 MCG/ACT nasal spray Place 1 spray into both nostrils daily. (Patient taking differently: Place 1 spray into both nostrils daily as needed. )  . Multiple Vitamin (MULTIVITAMIN WITH MINERALS) TABS tablet Take 1 tablet by mouth daily.  . ondansetron (ZOFRAN-ODT) 4 MG disintegrating tablet Take 1 tablet (4 mg total) by mouth every 8 (eight) hours as needed for nausea or vomiting.   No facility-administered encounter medications on file as of 10/21/2016.     Functional Status:   In your present state of health, do you have any difficulty performing the following activities: 09/30/2016 08/19/2016  Hearing? [name]  Vision? N N  Difficulty concentrating or making decisions? [place]  Walking or climbing stairs? N N  Dressing or bathing? N N  Doing errands, shopping? N Y  Quarry managerreparing Food and eating ? - N  Using the Toilet? - N  In the past six months, have you accidently leaked urine? - Y  Do you have problems with loss of bowel control? - N  Managing your Medications? - N  Managing your Finances? - Y  Housekeeping or managing your  Housekeeping? - Y  Some recent data might be hidden    Fall/Depression Screening:    PHQ 2/9 Scores 08/19/2016 07/30/2016  PHQ - 2 Score 0 0  BP 120/78 (BP Location: Right Arm, Patient Position: Sitting, Cuff Size: Normal)   Pulse 90   Resp 20   Ht 1.727 m (5\' 8" )   Wt 138 lb 8 oz (62.8 kg)   SpO2 98%   BMI 21.06 kg/m    Assessment:   [facility]  Ongoing case management related to risk of stroke (CAD) Follow up on medications adherence Follow up on medical appointments adherence (transportation) Education on smoking cessation  Plan:  Will obtain a signed consent form and verify enrollment.  Will completed a physical assessment with no abnormal findings today. Will educate on stroke prevention measures and provide EMMI printed educational material (Signs of Stroke, COPD Quitting smoking, COPD, Dementia-behavior and sleep problems, Dementia-Keeping safe in the home). This information was reviewed for increasing pt's knowledge base. Will verify pt's has been adherent with his daily medications and has a sufficient supply. Reviewed medications and continues to encouraged adherence with daily administration. Will verify adherence with all previous medical appointments and review upcoming pending appointments and verify at this time pt has a sufficient transportation. Will provide and alert pt of other resources of transportation services based upon local resources to the area which maybe limited.  Will discussed options for QUIT smoking programs and the risk involved if pt does not quit smoking related to his ongoing health. Will also provide educational EMMI printable information concerning quit smoking.  Plan of care discussed along with the goals that are in place as pt continues to progress toward his ongoing recovery post stroke and COPD symptoms and risk related if not monitored.  Will alert current provider of pt's involvement however limited when pt visits his  up[facility].   Elliot CousinLisa Shelbee Apgar, RN Care Management Coordinator [facility] Main Office [phone]

## 2016-10-21 NOTE — Telephone Encounter (Signed)
Dismissal letter printed and attached to dismissal form for provider to review and sign.

## 2016-10-22 ENCOUNTER — Encounter: Payer: Self-pay | Admitting: *Deleted

## 2016-10-22 NOTE — Telephone Encounter (Signed)
Called to follow up with patient.  Left a message for call back.   

## 2016-10-23 ENCOUNTER — Other Ambulatory Visit: Payer: Self-pay | Admitting: *Deleted

## 2016-10-23 NOTE — Patient Outreach (Signed)
[facility]) Care Management  10/23/2016  Marcell BarlowDavid R Fragoso 01/24/1935 [id number]   CSW received an In WellPointBasket Message from Elliot CousinLisa Matthews, [facility]CM with [facility], indicating that patient is not interested in CSW services at this time and to close patient's case. In addition, patient has decided to be followed by a primary care physician/provider at [facility], which is out-of-network with [facility]. Ms. Ashley RoyaltyMatthews has agreed to send a case closure letter to patient's Primary Care Physician, Dr. Esperanza RichtersEdward Saguier, notifying him of CSW and RNCM's plans to close patient's case. [name], BSW, MSW, LCSW  Licensed Restaurant manager, fast foodClinical Social Worker  [facility] Care Management [place] [facility]  Mailing [place] [address], [place], [name] [address] Physical Address-[address], [place], [name] [address] Toll Free Main # [phone] Fax # [phone] Cell # [phone]  Office # [phone] Mardene [email or web address]

## 2016-10-27 ENCOUNTER — Telehealth: Payer: Self-pay | Admitting: Medical

## 2016-10-27 ENCOUNTER — Other Ambulatory Visit: Payer: Self-pay | Admitting: *Deleted

## 2016-10-27 ENCOUNTER — Encounter: Payer: Self-pay | Admitting: *Deleted

## 2016-10-27 NOTE — Patient Outreach (Signed)
[facility] Care Management  10/27/2016  David BarlowDavid R Duke 08/03/1935 [id number]   RN spoke with pt's caregiver spouse today David Duke(David Duke) who verified credentials on this pt and indicated they has seem the new primary provider at [facility] and established a the new provider. States current they are adjusting his medications accordingly to accommodate pt's anxiety levels and "it seems to be working". Both the pt and his spouse are pleased with the new provider. Stats pt has attended all scheduled appointments so far and continue to take the recommended medications as they continue to be adjusted. Reports a follow up appointments will take place again today to once again address pt's anxiety and agitation related to his medication therapy.  Based upon the new provider pt is no longer eligible [facility] as previously informed once the decision was finalized that the pt would be attending the new provider for ongoing treatment. The plan of care was reviewed with the ongoing goals as RN continue to encouraged adherence with caregiver to continue encouraging pt in managing his care related to his pass stroke and COPD. Verified caregiver's  Understanding of the resources that exist in the [facility] calendar if needed and if any other resources are needed to call the 211 or [facility] office for further assistance. Caregiver very appreciated and expressed her gratitude for the services rendered.  No other inquires or request at this time as RN will close and notify the listed provider ( caregiver not aware of the name of the new provider or PA seen at the [facility].  Elliot CousinLisa Laney Bagshaw, RN Care Management Coordinator [facility] Main Office [phone]

## 2016-10-27 NOTE — Telephone Encounter (Signed)
Patient dismissed from [facility] by Esperanza RichtersEdward Saguier PA-C , effective September 23, 2016. Dismissal letter sent out by certified / registered mail.  DAJ

## 2016-10-28 NOTE — Telephone Encounter (Signed)
Wilson SingerDorothy A Duke   10/27/16 1:05 PM  Note    Patient dismissed from [facility] by Esperanza RichtersEdward Saguier PA-C , effective September 23, 2016. Dismissal letter sent out by certified / registered mail.  DAJ

## 2016-11-03 ENCOUNTER — Ambulatory Visit: Payer: Medicare Other | Admitting: *Deleted

## 2016-11-07 ENCOUNTER — Emergency Department (HOSPITAL_BASED_OUTPATIENT_CLINIC_OR_DEPARTMENT_OTHER)
Admission: EM | Admit: 2016-11-07 | Discharge: 2016-11-07 | Disposition: A | Discharge status: Home / Self Care | Payer: Medicare Other | Attending: Emergency Medicine | Admitting: Emergency Medicine

## 2016-11-07 ENCOUNTER — Encounter (HOSPITAL_BASED_OUTPATIENT_CLINIC_OR_DEPARTMENT_OTHER): Payer: Self-pay | Admitting: Emergency Medicine

## 2016-11-07 DIAGNOSIS — N3001 Acute cystitis with hematuria: Secondary | ICD-10-CM | POA: Insufficient documentation

## 2016-11-07 DIAGNOSIS — Z79899 Other long term (current) drug therapy: Secondary | ICD-10-CM | POA: Insufficient documentation

## 2016-11-07 DIAGNOSIS — R300 Dysuria: Secondary | ICD-10-CM | POA: Diagnosis present

## 2016-11-07 DIAGNOSIS — J449 Chronic obstructive pulmonary disease, unspecified: Secondary | ICD-10-CM | POA: Insufficient documentation

## 2016-11-07 DIAGNOSIS — F17210 Nicotine dependence, cigarettes, uncomplicated: Secondary | ICD-10-CM | POA: Insufficient documentation

## 2016-11-07 LAB — CBC WITH DIFFERENTIAL/PLATELET
BASOS ABS: 0 10*3/uL (ref 0.0–0.1)
Basophils Relative: 0 %
EOS PCT: 0 %
Eosinophils Absolute: 0 10*3/uL (ref 0.0–0.7)
HCT: 44.4 % (ref 39.0–52.0)
Hemoglobin: 15.4 g/dL (ref 13.0–17.0)
LYMPHS PCT: 16 %
Lymphs Abs: 2.4 10*3/uL (ref 0.7–4.0)
MCH: 31.2 pg (ref 26.0–34.0)
MCHC: 34.7 g/dL (ref 30.0–36.0)
MCV: 89.9 fL (ref 78.0–100.0)
Monocytes Absolute: 1.2 10*3/uL — ABNORMAL HIGH (ref 0.1–1.0)
Monocytes Relative: 8 %
Neutro Abs: 11.2 10*3/uL — ABNORMAL HIGH (ref 1.7–7.7)
Neutrophils Relative %: 76 %
Platelets: 280 10*3/uL (ref 150–400)
RBC: 4.94 MIL/uL (ref 4.22–5.81)
RDW: 13 % (ref 11.5–15.5)
WBC: 14.8 10*3/uL — ABNORMAL HIGH (ref 4.0–10.5)

## 2016-11-07 LAB — URINALYSIS, ROUTINE W REFLEX MICROSCOPIC
BILIRUBIN URINE: NEGATIVE
Glucose, UA: NEGATIVE mg/dL
KETONES UR: NEGATIVE mg/dL
NITRITE: NEGATIVE
Protein, ur: 30 mg/dL — AB
Specific Gravity, Urine: 1.021 (ref 1.005–1.030)
pH: 6 (ref 5.0–8.0)

## 2016-11-07 LAB — URINALYSIS, MICROSCOPIC (REFLEX): Squamous Epithelial / LPF: NONE SEEN

## 2016-11-07 LAB — COMPREHENSIVE METABOLIC PANEL
ALT: 27 U/L (ref 17–63)
AST: 24 U/L (ref 15–41)
Albumin: 3.5 g/dL (ref 3.5–5.0)
Alkaline Phosphatase: 65 U/L (ref 38–126)
Anion gap: 9 (ref 5–15)
BUN: 25 mg/dL — ABNORMAL HIGH (ref 6–20)
CHLORIDE: 98 mmol/L — AB (ref 101–111)
CO2: 27 mmol/L (ref 22–32)
Calcium: 8.8 mg/dL — ABNORMAL LOW (ref 8.9–10.3)
Creatinine, Ser: 1.46 mg/dL — ABNORMAL HIGH (ref 0.61–1.24)
GFR, EST AFRICAN AMERICAN: 51 mL/min — AB (ref >60)
GFR, EST NON AFRICAN AMERICAN: 44 mL/min — AB (ref >60)
Glucose, Bld: 97 mg/dL (ref 65–99)
POTASSIUM: 3.6 mmol/L (ref 3.5–5.1)
Sodium: 134 mmol/L — ABNORMAL LOW (ref 135–145)
Total Bilirubin: 0.6 mg/dL (ref 0.3–1.2)
Total Protein: 6.5 g/dL (ref 6.5–8.1)

## 2016-11-07 LAB — I-STAT CG4 LACTIC ACID, ED: LACTIC ACID, VENOUS: 2.09 mmol/L — AB (ref 0.5–1.9)

## 2016-11-07 LAB — LIPASE, BLOOD: LIPASE: 29 U/L (ref 11–51)

## 2016-11-07 MED ORDER — LEVOFLOXACIN 750 MG PO TABS: 750.0000 mg | ORAL_TABLET | Freq: Every day | ORAL | 0 refills | Status: AC

## 2016-11-07 MED ORDER — LEVOFLOXACIN 750 MG PO TABS
750.0000 mg | ORAL_TABLET | Freq: Once | ORAL | Status: AC
  Administered 2016-11-07: 750 mg via ORAL
  Filled 2016-11-07: qty 1

## 2016-11-07 MED ORDER — LIDOCAINE HCL 2 % EX GEL
CUTANEOUS | Status: AC
  Filled 2016-11-07: qty 20

## 2016-11-07 NOTE — Discharge Instructions (Signed)
Please take your antibiotics starting tomorrow for the next 4 days to treat your urinary tract infection. If you develop any new or worsening symptoms, please return to the nearest emergency department. Please schedule a follow-up appointment with your PCP and your urologist.

## 2016-11-07 NOTE — ED Triage Notes (Signed)
Patient states that he needs his catheter changed, he also feels like he has a UTI

## 2016-11-07 NOTE — ED Provider Notes (Signed)
[facility] Provider Note   CSN: [id number] Arrival date & time: 11/07/16  1704     History   Chief Complaint No chief complaint on file. lower abdominal cramping   HPI David Duke is a 82 y.o. male with a past medical history significant for COPD, prior strokes with chronic indwelling Foley catheter dependence, cholesterolemia, and recent diagnosis of bronchitis with recent completion of steroids and antibiotics who presents with dysuria, lower abdominal discomfort, and burning around his Foley catheter concerning for UTI. Patient and wife report the patient has had his Foley catheter for last year since a prior stroke. He has also had a history of urinary tract infection several times. He says it has felt similar to this. He denies fevers, chills, shortness breath, chest pain, or persistent cough. He says his cough is improved since recent antibiotics and steroids. He denies back pain. He does report some mild to moderate lower abdominal cramping pain. He denies any recent traumas. He denies any bleeding from around the meatus or pain in his scrotum or testicles. He denies any other symptoms and is most concerned about a urinary tract infection.   HPI  Past Medical History:  Diagnosis Date  . AKI (acute kidney injury) (HCC)   . Anxiety   . Blind right eye   . BPH (benign prostatic hypertrophy) with urinary retention   . COPD (chronic obstructive pulmonary disease) (HCC)   . Depression   . High cholesterol   . Macular degeneration of both eyes   . Renal disorder   . Shortness of breath dyspnea   . Stroke [facility])     Patient Active Problem List   Diagnosis Date Noted  . Enterocolitis 09/30/2016  . HLD (hyperlipidemia) 09/30/2016  . Tobacco abuse 09/30/2016  . Nausea vomiting and diarrhea 09/30/2016  . History of stroke in prior 3 months 01/22/2016  . Chronic tension-type headache, not intractable   . Acute frontal sinusitis   . 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 Gait disturbance, post-stroke   . Ataxia, late effect of cerebrovascular disease   . BPH (benign prostatic hyperplasia)   . Urinary retention   . Adjustment disorder with mixed anxiety and depressed mood   . Chronic obstructive pulmonary disease (HCC)   . Cephalalgia   . AKI (acute kidney injury) (HCC)   . Macular degeneration   . Prediabetes   . Hypokalemia   . Absolute anemia   . Aphasia   . Acute encephalopathy 11/25/2015  . Hyponatremia 11/25/2015  . Acute kidney injury (HCC) 11/25/2015  . Depression   . Anxiety   . COPD (chronic obstructive pulmonary disease) (HCC)   . ICH (intracerebral hemorrhage) (HCC) 11/22/2015    Past Surgical History:  Procedure Laterality Date  . NO PAST SURGERIES         Home Medications    Prior to Admission medications   Medication Sig Start Date End Date Taking? Authorizing Provider  albuterol (PROVENTIL HFA;VENTOLIN HFA) 108 (90 Base) MCG/ACT inhaler Inhale 2 puffs into the lungs every 4 (four) hours as needed for wheezing or shortness of breath (or cough). 09/23/16   Ramon Dredge Saguier, PA-C  atorvastatin (LIPITOR) 20 MG tablet Take 1 tablet (20 mg total) by mouth daily at 6 PM. 09/03/16   Esperanza Richters, PA-C  diazepam (VALIUM) 5 MG tablet Take 1 tablet (5 mg total) by mouth every 8 (eight) hours as needed for anxiety. Can fill no sooner than October 24, 2016. 10/20/16   Esperanza RichtersEdward Saguier, PA-C  finasteride (PROSCAR) 5 MG tablet Take 5 mg by mouth daily. 09/22/16   Historical Provider, MD  fluticasone (FLONASE) 50 MCG/ACT nasal spray Place 1 spray into both nostrils daily. Patient taking differently: Place 1 spray into both nostrils daily as needed.  12/11/15   Jacquelynn CreePamela S Love, PA-C  Multiple Vitamin (MULTIVITAMIN WITH MINERALS) TABS tablet Take 1 tablet by mouth daily.    Historical Provider, MD  ondansetron (ZOFRAN-ODT) 4 MG disintegrating tablet Take 1 tablet (4 mg total) by mouth every 8 (eight) hours as  needed for nausea or vomiting. 10/12/16   Melene Planan Floyd, DO    Family History Family History  Problem Relation Age of Onset  . Colon cancer Mother   . Cerebral aneurysm Father     Social History Social History  Substance Use Topics  . Smoking status: Current Every Day Smoker    Packs/day: 0.50    Years: 60.00    Types: Cigarettes  . Smokeless tobacco: Never Used  . Alcohol use No     Allergies   Penicillins   Review of Systems Review of Systems  Constitutional: Negative for activity change, chills, diaphoresis, fatigue and fever.  HENT: Negative for congestion and rhinorrhea.   Eyes: Negative for visual disturbance.  Respiratory: Negative for cough, chest tightness, shortness of breath, wheezing and stridor.   Cardiovascular: Negative for chest pain, palpitations and leg swelling.  Gastrointestinal: Negative for abdominal distention, abdominal pain, blood in stool, constipation, diarrhea, nausea and vomiting.  Genitourinary: Positive for dysuria. Negative for decreased urine volume, difficulty urinating, discharge, flank pain, hematuria, penile pain, penile swelling, scrotal swelling and testicular pain.  Musculoskeletal: Negative for back pain and gait problem.  Skin: Negative for rash and wound.  Neurological: Negative for dizziness, weakness, light-headedness and headaches.  Psychiatric/Behavioral: Negative for agitation.  All other systems reviewed and are negative.    Physical Exam Updated Vital Signs BP 138/90 (BP Location: Right Arm)   Pulse 97   Temp 98.4 F (36.9 C) (Oral)   Resp 20   Ht 5\' 8"  (1.727 m)   Wt 138 lb (62.6 kg)   SpO2 100%   BMI 20.98 kg/m   Physical Exam  Constitutional: He is oriented to person, place, and time. He appears well-developed and well-nourished. No distress.  HENT:  Head: Normocephalic and atraumatic.  Right Ear: External ear normal.  Left Ear: External ear normal.  Nose: Nose normal.  Mouth/Throat: Oropharynx is clear and  moist. No oropharyngeal exudate.  Eyes: Conjunctivae and EOM are normal. Pupils are equal, round, and reactive to light.  Neck: Normal range of motion. Neck supple.  Cardiovascular: Normal rate and normal heart sounds.   No murmur heard. Pulmonary/Chest: Effort normal and breath sounds normal. No stridor. No respiratory distress. He has no wheezes. He exhibits no tenderness.  Abdominal: Soft. Bowel sounds are normal. There is tenderness. There is no rebound and no guarding.  Genitourinary:  Genitourinary Comments: Foley catheter in place. No bleeding around site. No ulcers or erythema. No concern for cellulitis.  Musculoskeletal: He exhibits no edema or tenderness.  Neurological: He is alert and oriented to person, place, and time. He displays normal reflexes. No cranial nerve deficit. He exhibits normal muscle tone. Coordination normal.  Skin: Skin is warm. Capillary refill takes less than 2 seconds. No rash noted. He is not diaphoretic. No erythema.  Psychiatric: He has a normal mood and affect.     ED Treatments / Results  Labs (all labs ordered are listed, but only abnormal results are displayed) Labs Reviewed  CBC WITH DIFFERENTIAL/PLATELET - Abnormal; Notable for the following:       Result Value   WBC 14.8 (*)    Neutro Abs 11.2 (*)    Monocytes Absolute 1.2 (*)    All other components within normal limits  COMPREHENSIVE METABOLIC PANEL - Abnormal; Notable for the following:    Sodium 134 (*)    Chloride 98 (*)    BUN 25 (*)    Creatinine, Ser 1.46 (*)    Calcium 8.8 (*)    GFR calc non Af Amer 44 (*)    GFR calc Af Amer 51 (*)    All other components within normal limits  URINALYSIS, ROUTINE W REFLEX MICROSCOPIC - Abnormal; Notable for the following:    Color, Urine RED (*)    APPearance CLOUDY (*)    Hgb urine dipstick LARGE (*)    Protein, ur 30 (*)    Leukocytes, UA LARGE (*)    All other components within normal limits  URINALYSIS, MICROSCOPIC (REFLEX) -  Abnormal; Notable for the following:    Bacteria, UA FEW (*)    All other components within normal limits  I-STAT CG4 LACTIC ACID, ED - Abnormal; Notable for the following:    Lactic Acid, Venous 2.09 (*)    All other components within normal limits  URINE CULTURE  LIPASE, BLOOD    EKG  EKG Interpretation None       Radiology No results found.  Procedures Procedures (including critical care time)  Medications Ordered in ED Medications  levofloxacin (LEVAQUIN) tablet 750 mg (750 mg Oral Given 11/07/16 1931)     Initial Impression / Assessment and Plan / ED Course  I have reviewed the triage vital signs and the nursing notes.  Pertinent labs & imaging results that were available during my care of the patient were reviewed by me and considered in my medical decision making (see chart for details).    David Duke is a 82 y.o. male with a past medical history significant for COPD, prior strokes with chronic indwelling Foley catheter dependence, cholesterolemia, and recent diagnosis of bronchitis with recent completion of steroids and antibiotics who presents with dysuria, lower abdominal discomfort, and burning around his Foley catheter concerning for UTI.  History and exam are seen above.   On exam, patient has a Foley catheter in place with no drainage of urine or blood around the meatus. No rashes or erythema seen. Penis nontender and scrotum nontender. No hernias were appreciated. Lower abdomen is slightly tender to palpation. No tenderness in the upper abdomen. No CVA tenderness. Lungs are clear.  Based on reported symptoms, patient will have Foley catheter exchanged in the ED and urinalysis will be collected from the new catheter. Patient will have cultures sent. Patient will have screening laboratory testing given the lower abdominal pain to look for other abdominal problems. At this time, do not feel patient has pyelonephritis but will look for urine  results.  Anticipate reassessment following workup.  Diagnostic testing results show mild leukocytosis. Suspect this is secondary to steroid  Versus possible infection. Kidney function slightly elevated at 1.4 however, this is similar to prior. Values. Lipase nonelevated. Lactic acid slightly elevated at 2.09.   Urinalysis shows bacteria with no epithelial cells and large leukocytes. This urine was taken after the exchange of new foley catheter.   Suspect this is consistent with urinary tract infection given  dysuria. Patient will be treated with antibiotics. Patient received one dose tonight. Do not feel patient has pyelonephritis based on exam. Patient able to tolerate fluids and food without difficulty. No fevers, nausea, or vomiting.  Patient will be discharged home with antibiotics and instructions to follow up with his primary care physician and urologist. Patient had no other questions or concerns. Patient discharged in good condition.   Final Clinical Impressions(s) / ED Diagnoses   Final diagnoses:  Acute cystitis with hematuria    New Prescriptions Discharge Medication List as of 11/07/2016  8:09 PM    START taking these medications   Details  levofloxacin (LEVAQUIN) 750 MG tablet Take 1 tablet (750 mg total) by mouth daily., Starting Sat 11/08/2016, Until Wed 11/12/2016, Print         Clinical Impression: 1. Acute cystitis with hematuria     Disposition: Discharge  Condition: Good  I have discussed the results, Dx and Tx plan with the pt(& family if present). He/she/they expressed understanding and agree(s) with the plan. Discharge instructions discussed at great length. Strict return precautions discussed and pt &/or family have verbalized understanding of the instructions. No further questions at time of discharge.    Discharge Medication List as of 11/07/2016  8:09 PM    START taking these medications   Details  levofloxacin (LEVAQUIN) 750 MG tablet Take 1 tablet  (750 mg total) by mouth daily., Starting Sat 11/08/2016, Until Wed 11/12/2016, Print        Follow Up: Maye Hides, PA [address] [place] [address] [phone]     [facility] EMERGENCY DEPARTMENT [address] [id number] [address] [place] [address] [phone]  If symptoms worsen     Heide Scales, MD 11/08/16 1020

## 2016-11-09 LAB — URINE CULTURE: Culture: 70000 — AB

## 2016-11-10 ENCOUNTER — Telehealth: Payer: Self-pay | Admitting: Emergency Medicine

## 2016-11-10 NOTE — Telephone Encounter (Signed)
Post ED Visit - Positive Culture Follow-up  Culture report reviewed by antimicrobial stewardship pharmacist:  []  Enzo BiNathan Batchelder, Pharm.D. []  Celedonio MiyamotoJeremy Frens, Pharm.D., BCPS []  Garvin FilaMike Maccia, Pharm.D. [x]  Georgina PillionElizabeth Martin, Pharm.D., BCPS []  WisconMinh Pham, [address]D., BCPS, AAHIVP []  Estella HuskMichelle Turner, Pharm.D., BCPS, AAHIVP []  Tennis Mustassie Stewart, Pharm.D. []  Rob Oswaldo DoneVincent, [address]  Positive urine culture Treated with levofloxacin, organism sensitive to the same and no further patient follow-up is required at this time.  David MullMiller, David Duke 11/10/2016, 3:33 PM

## 2016-11-11 NOTE — Telephone Encounter (Signed)
Received signed domestic return receipt verifying delivery of certified letter on November 10, 2016. Article number [id number]

## 2016-12-03 ENCOUNTER — Emergency Department (HOSPITAL_BASED_OUTPATIENT_CLINIC_OR_DEPARTMENT_OTHER)
Admission: EM | Admit: 2016-12-03 | Discharge: 2016-12-03 | Disposition: A | Discharge status: Home / Self Care | Payer: Medicare Other | Source: Home / Self Care | Attending: Emergency Medicine | Admitting: Emergency Medicine

## 2016-12-03 ENCOUNTER — Encounter (HOSPITAL_BASED_OUTPATIENT_CLINIC_OR_DEPARTMENT_OTHER): Payer: Self-pay | Admitting: *Deleted

## 2016-12-03 ENCOUNTER — Emergency Department (HOSPITAL_BASED_OUTPATIENT_CLINIC_OR_DEPARTMENT_OTHER)
Admission: EM | Admit: 2016-12-03 | Discharge: 2016-12-03 | Disposition: A | Discharge status: Home / Self Care | Payer: Medicare Other | Attending: Emergency Medicine | Admitting: Emergency Medicine

## 2016-12-03 DIAGNOSIS — J449 Chronic obstructive pulmonary disease, unspecified: Secondary | ICD-10-CM | POA: Insufficient documentation

## 2016-12-03 DIAGNOSIS — Z79899 Other long term (current) drug therapy: Secondary | ICD-10-CM | POA: Diagnosis not present

## 2016-12-03 DIAGNOSIS — Y828 Other medical devices associated with adverse incidents: Secondary | ICD-10-CM | POA: Diagnosis not present

## 2016-12-03 DIAGNOSIS — T839XXD Unspecified complication of genitourinary prosthetic device, implant and graft, subsequent encounter: Secondary | ICD-10-CM

## 2016-12-03 DIAGNOSIS — T83038A Leakage of other indwelling urethral catheter, initial encounter: Secondary | ICD-10-CM | POA: Diagnosis not present

## 2016-12-03 DIAGNOSIS — T83098D Other mechanical complication of other indwelling urethral catheter, subsequent encounter: Secondary | ICD-10-CM

## 2016-12-03 DIAGNOSIS — N4889 Other specified disorders of penis: Secondary | ICD-10-CM | POA: Diagnosis present

## 2016-12-03 DIAGNOSIS — N189 Chronic kidney disease, unspecified: Secondary | ICD-10-CM | POA: Diagnosis not present

## 2016-12-03 DIAGNOSIS — Y733 Surgical instruments, materials and gastroenterology and urology devices (including sutures) associated with adverse incidents: Secondary | ICD-10-CM | POA: Insufficient documentation

## 2016-12-03 DIAGNOSIS — F17210 Nicotine dependence, cigarettes, uncomplicated: Secondary | ICD-10-CM

## 2016-12-03 DIAGNOSIS — T839XXA Unspecified complication of genitourinary prosthetic device, implant and graft, initial encounter: Secondary | ICD-10-CM

## 2016-12-03 LAB — BASIC METABOLIC PANEL
ANION GAP: 7 (ref 5–15)
BUN: 13 mg/dL (ref 6–20)
CHLORIDE: 106 mmol/L (ref 101–111)
CO2: 26 mmol/L (ref 22–32)
Calcium: 8.7 mg/dL — ABNORMAL LOW (ref 8.9–10.3)
Creatinine, Ser: 1.44 mg/dL — ABNORMAL HIGH (ref 0.61–1.24)
GFR calc Af Amer: 52 mL/min — ABNORMAL LOW (ref >60)
GFR calc non Af Amer: 45 mL/min — ABNORMAL LOW (ref >60)
GLUCOSE: 108 mg/dL — AB (ref 65–99)
POTASSIUM: 3.8 mmol/L (ref 3.5–5.1)
Sodium: 139 mmol/L (ref 135–145)

## 2016-12-03 LAB — CBC WITH DIFFERENTIAL/PLATELET
BASOS ABS: 0 10*3/uL (ref 0.0–0.1)
BASOS PCT: 0 %
Eosinophils Absolute: 0.2 10*3/uL (ref 0.0–0.7)
Eosinophils Relative: 2 %
HCT: 38.6 % — ABNORMAL LOW (ref 39.0–52.0)
Hemoglobin: 13 g/dL (ref 13.0–17.0)
LYMPHS ABS: 1.3 10*3/uL (ref 0.7–4.0)
LYMPHS PCT: 14 %
MCH: 31 pg (ref 26.0–34.0)
MCHC: 33.7 g/dL (ref 30.0–36.0)
MCV: 92.1 fL (ref 78.0–100.0)
MONO ABS: 0.6 10*3/uL (ref 0.1–1.0)
MONOS PCT: 7 %
NEUTROS ABS: 7 10*3/uL (ref 1.7–7.7)
Neutrophils Relative %: 77 %
Platelets: 279 10*3/uL (ref 150–400)
RBC: 4.19 MIL/uL — ABNORMAL LOW (ref 4.22–5.81)
RDW: 13.9 % (ref 11.5–15.5)
WBC: 9 10*3/uL (ref 4.0–10.5)

## 2016-12-03 LAB — URINALYSIS, ROUTINE W REFLEX MICROSCOPIC
Bilirubin Urine: NEGATIVE
GLUCOSE, UA: NEGATIVE mg/dL
Ketones, ur: NEGATIVE mg/dL
Nitrite: NEGATIVE
PH: 5.5 (ref 5.0–8.0)
Protein, ur: NEGATIVE mg/dL
Specific Gravity, Urine: 1.011 (ref 1.005–1.030)

## 2016-12-03 LAB — URINALYSIS, MICROSCOPIC (REFLEX)

## 2016-12-03 NOTE — ED Notes (Signed)
ED Provider at bedside. 

## 2016-12-03 NOTE — ED Notes (Signed)
catheter being assess by PA at the bedside.

## 2016-12-03 NOTE — ED Triage Notes (Signed)
Pt c/o foley leaking-was placed here today-NAD-steady gait

## 2016-12-03 NOTE — ED Triage Notes (Signed)
Pt amb to room 9 with [facility] reporting pain at the site of his indwelling urinary catheter, and some leakage overnight. Pt is amb with quick steady gait in nad, alert to baseline ms, confused. Pt denies any other c/o.

## 2016-12-03 NOTE — ED Provider Notes (Signed)
[facility] Provider Note   CSN: [id number] Arrival date & time: 12/03/16  0807     History   Chief Complaint Chief Complaint  Patient presents with  . Penis Pain    HPI David Duke is a 82 y.o. male.Level V caveat dementia history is obtained from patient and from patient's wife. Patient brought by EMS complains of leaking around Foley catheter since this morning. Also complains of suprapubic abdominal pain. Has pain at the tip of his penis for the past 3 or 4 days. No vomiting or nausea no fever. No other associated symptoms. Patient's had chronic indwelling Foley since last year after stroke.  HPI  Past Medical History:  Diagnosis Date  . AKI (acute kidney injury) (HCC)   . Anxiety   . Blind right eye   . BPH (benign prostatic hypertrophy) with urinary retention   . COPD (chronic obstructive pulmonary disease) (HCC)   . Depression   . High cholesterol   . Macular degeneration of both eyes   . Renal disorder   . Shortness of breath dyspnea   . Stroke [facility](HCC)     Patient Active Problem List   Diagnosis Date Noted  . Enterocolitis 09/30/2016  . HLD (hyperlipidemia) 09/30/2016  . Tobacco abuse 09/30/2016  . Nausea vomiting and diarrhea 09/30/2016  . History of stroke in prior 3 months 01/22/2016  . Chronic tension-type headache, not intractable   . Acute frontal sinusitis   . 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 Gait disturbance, post-stroke   . Ataxia, late effect of cerebrovascular disease   . BPH (benign prostatic hyperplasia)   . Urinary retention   . Adjustment disorder with mixed anxiety and depressed mood   . Chronic obstructive pulmonary disease (HCC)   . Cephalalgia   . AKI (acute kidney injury) (HCC)   . Macular degeneration   . Prediabetes   . Hypokalemia   . Absolute anemia   . Aphasia   . Acute encephalopathy 11/25/2015  . Hyponatremia 11/25/2015  . Acute kidney injury (HCC) 11/25/2015  . Depression    . Anxiety   . COPD (chronic obstructive pulmonary disease) (HCC)   . ICH (intracerebral hemorrhage) (HCC) 11/22/2015    Past Surgical History:  Procedure Laterality Date  . NO PAST SURGERIES         Home Medications    Prior to Admission medications   Medication Sig Start Date End Date Taking? Authorizing Provider  albuterol (PROVENTIL HFA;VENTOLIN HFA) 108 (90 Base) MCG/ACT inhaler Inhale 2 puffs into the lungs every 4 (four) hours as needed for wheezing or shortness of breath (or cough). 09/23/16   Ramon DredgeEdward Saguier, PA-C  atorvastatin (LIPITOR) 20 MG tablet Take 1 tablet (20 mg total) by mouth daily at 6 PM. 09/03/16   Esperanza RichtersEdward Saguier, PA-C  diazepam (VALIUM) 5 MG tablet Take 1 tablet (5 mg total) by mouth every 8 (eight) hours as needed for anxiety. Can fill no sooner than October 24, 2016. 10/20/16   Esperanza RichtersEdward Saguier, PA-C  finasteride (PROSCAR) 5 MG tablet Take 5 mg by mouth daily. 09/22/16   Historical Provider, MD  fluticasone (FLONASE) 50 MCG/ACT nasal spray Place 1 spray into both nostrils daily. Patient taking differently: Place 1 spray into both nostrils daily as needed.  12/11/15   Jacquelynn CreePamela S Love, PA-C  Multiple Vitamin (MULTIVITAMIN WITH MINERALS) TABS tablet Take 1 tablet by mouth daily.    Historical Provider, MD  ondansetron (ZOFRAN-ODT) 4 MG disintegrating tablet  Take 1 tablet (4 mg total) by mouth every 8 (eight) hours as needed for nausea or vomiting. 10/12/16   Melene Plan, DO    Family History Family History  Problem Relation Age of Onset  . Colon cancer Mother   . Cerebral aneurysm Father     Social History Social History  Substance Use Topics  . Smoking status: Current Every Day Smoker    Packs/day: 0.50    Years: 60.00    Types: Cigarettes  . Smokeless tobacco: Never Used  . Alcohol use No     Allergies   Penicillins   Review of Systems Review of Systems  Unable to perform ROS: Dementia  Gastrointestinal: Positive for abdominal pain.       Suprapubic  pain  Genitourinary:       Chronic indwelling Foley     Physical Exam Updated Vital Signs BP 99/83 (BP Location: Right Arm)   Pulse 95   Temp 98.2 F (36.8 C) (Oral)   Resp 16   Ht 5\' 8"  (1.727 m)   Wt 138 lb (62.6 kg)   SpO2 99%   BMI 20.98 kg/m   Physical Exam  Constitutional:  Chronically ill-appearing  HENT:  Head: Normocephalic and atraumatic.  Eyes: Conjunctivae are normal. Pupils are equal, round, and reactive to light.  Neck: Neck supple. No tracheal deviation present. No thyromegaly present.  Cardiovascular: Normal rate and regular rhythm.   No murmur heard. Pulmonary/Chest: Effort normal and breath sounds normal.  Abdominal: Soft. Bowel sounds are normal. He exhibits no distension. There is no tenderness.  Genitourinary: Penis normal.  Genitourinary Comments: Foley in place glans penis Is not red or swollen  Musculoskeletal: Normal range of motion. He exhibits no edema or tenderness.  Neurological: He is alert. Coordination normal.  Gait normal  Skin: Skin is warm and dry. No rash noted.  Psychiatric: He has a normal mood and affect.  Nursing note and vitals reviewed.    ED Treatments / Results  Labs (all labs ordered are listed, but only abnormal results are displayed) Labs Reviewed  URINALYSIS, ROUTINE W REFLEX MICROSCOPIC  BASIC METABOLIC PANEL  CBC WITH DIFFERENTIAL/PLATELET    EKG  EKG Interpretation None       Radiology No results found.  Procedures Procedures (including critical care time)  Medications Ordered in ED Medications - No data to display Results for orders placed or performed during the hospital encounter of 12/03/16  Urinalysis, Routine w reflex microscopic  Result Value Ref Range   Color, Urine YELLOW YELLOW   APPearance CLOUDY (A) CLEAR   Specific Gravity, Urine 1.011 1.005 - 1.030   pH 5.5 5.0 - 8.0   Glucose, UA NEGATIVE NEGATIVE mg/dL   Hgb urine dipstick LARGE (A) NEGATIVE   Bilirubin Urine NEGATIVE NEGATIVE     Ketones, ur NEGATIVE NEGATIVE mg/dL   Protein, ur NEGATIVE NEGATIVE mg/dL   Nitrite NEGATIVE NEGATIVE   Leukocytes, UA MODERATE (A) NEGATIVE  Basic metabolic panel  Result Value Ref Range   Sodium 139 135 - 145 mmol/L   Potassium 3.8 3.5 - 5.1 mmol/L   Chloride 106 101 - 111 mmol/L   CO2 26 22 - 32 mmol/L   Glucose, Bld 108 (H) 65 - 99 mg/dL   BUN 13 6 - 20 mg/dL   Creatinine, Ser 1.61 (H) 0.61 - 1.24 mg/dL   Calcium 8.7 (L) 8.9 - 10.3 mg/dL   GFR calc non Af Amer 45 (L) >60 mL/min   GFR calc Af  Amer 52 (L) >60 mL/min   Anion gap 7 5 - 15  CBC with Differential/Platelet  Result Value Ref Range   WBC 9.0 4.0 - 10.5 K/uL   RBC 4.19 (L) 4.22 - 5.81 MIL/uL   Hemoglobin 13.0 13.0 - 17.0 g/dL   HCT 16.1 (L) 09.6 - 04.5 %   MCV 92.1 78.0 - 100.0 fL   MCH 31.0 26.0 - 34.0 pg   MCHC 33.7 30.0 - 36.0 g/dL   RDW 40.9 81.1 - 91.4 %   Platelets 279 150 - 400 K/uL   Neutrophils Relative % 77 %   Neutro Abs 7.0 1.7 - 7.7 K/uL   Lymphocytes Relative 14 %   Lymphs Abs 1.3 0.7 - 4.0 K/uL   Monocytes Relative 7 %   Monocytes Absolute 0.6 0.1 - 1.0 K/uL   Eosinophils Relative 2 %   Eosinophils Absolute 0.2 0.0 - 0.7 K/uL   Basophils Relative 0 %   Basophils Absolute 0.0 0.0 - 0.1 K/uL  Urinalysis, Microscopic (reflex)  Result Value Ref Range   RBC / HPF 6-30 0 - 5 RBC/hpf   WBC, UA 6-30 0 - 5 WBC/hpf   Bacteria, UA MANY (A) NONE SEEN   Squamous Epithelial / LPF 0-5 (A) NONE SEEN   Hyphae Yeast PRESENT    No results found.  Initial Impression / Assessment and Plan / ED Course  I have reviewed the triage vital signs and the nursing notes.  Pertinent labs & imaging results that were available during my care of the patient were reviewed by me and considered in my medical decision making (see chart for details).     Foley catheter was changed by the nurse from 47 [place] to 61 [place]. No signs of urine infection no fever leukocytosis. No need for urine culture or antibiotic treatment.  Plan follow-up with urologist or primary care physician as needed. Renal insufficiency is chronic. He can take Tylenol as needed for pain Final Clinical Impressions(s) / ED Diagnoses  Diagnosis #1 Foley catheter problem #2 chronic renal insufficiency Final diagnoses:  None    New Prescriptions New Prescriptions   No medications on file     Doug Sou, MD 12/03/16 1038

## 2016-12-03 NOTE — ED Provider Notes (Signed)
[facility] Note   CSN: [id number] Arrival date & time: 12/03/16  1542  By signing my name below, I, Linna Darner, attest that this documentation has been prepared under the direction and in the presence of Lyndel Safe, [place]. Electronically Signed: Linna Darner, Scribe. 12/03/2016. 4:13 PM.  History   Chief Complaint Chief Complaint  Patient presents with  . Urinary Retention    foley cath leaking    The history is provided by the patient and the spouse. No language interpreter was used.     HPI Comments: David Duke is a 82 y.o. male with PMHx including BPH and renal disorder who presents to the Emergency Department complaining of persistent "leaking" around his foley catheter for one hour. He reports associated occasional sharp pains at the tip of his penis and a burning sensation when his urine leaks. He was seen here this morning for the same and had his foley catheter replaced from a 57 [place] to an 73 [place]. Pt also had a BMP, CBC w/ differential, and urinalysis performed here this morning; his urinalysis was not concerning for UTI. Per wife, patient has worn a foley catheter since having a stroke just over one year ago and has not experienced "leaking" from his catheter site until today. Wife notes patient has a h/o frequent UTI's due to his long-term foley catheter use. He denies fevers, chills, abdominal pain/pressure, or any other associated symptoms.  Past Medical History:  Diagnosis Date  . AKI (acute kidney injury) (HCC)   . Anxiety   . Blind right eye   . BPH (benign prostatic hypertrophy) with urinary retention   . COPD (chronic obstructive pulmonary disease) (HCC)   . Depression   . High cholesterol   . Macular degeneration of both eyes   . Renal disorder   . Shortness of breath dyspnea   . Stroke [facility])     Patient Active Problem List   Diagnosis Date Noted  . Enterocolitis 09/30/2016  . HLD (hyperlipidemia) 09/30/2016  .  Tobacco abuse 09/30/2016  . Nausea vomiting and diarrhea 09/30/2016  . History of stroke in prior 3 months 01/22/2016  . Chronic tension-type headache, not intractable   . Acute frontal sinusitis   . 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 Gait disturbance, post-stroke   . Ataxia, late effect of cerebrovascular disease   . BPH (benign prostatic hyperplasia)   . Urinary retention   . Adjustment disorder with mixed anxiety and depressed mood   . Chronic obstructive pulmonary disease (HCC)   . Cephalalgia   . AKI (acute kidney injury) (HCC)   . Macular degeneration   . Prediabetes   . Hypokalemia   . Absolute anemia   . Aphasia   . Acute encephalopathy 11/25/2015  . Hyponatremia 11/25/2015  . Acute kidney injury (HCC) 11/25/2015  . Depression   . Anxiety   . COPD (chronic obstructive pulmonary disease) (HCC)   . ICH (intracerebral hemorrhage) (HCC) 11/22/2015    Past Surgical History:  Procedure Laterality Date  . NO PAST SURGERIES         Home Medications    Prior to Admission medications   Medication Sig Start Date End Date Taking? Authorizing Provider  albuterol (PROVENTIL HFA;VENTOLIN HFA) 108 (90 Base) MCG/ACT inhaler Inhale 2 puffs into the lungs every 4 (four) hours as needed for wheezing or shortness of breath (or cough). 09/23/16   Ramon Dredge Saguier, PA-C  atorvastatin (LIPITOR) 20 MG tablet Take  1 tablet (20 mg total) by mouth daily at 6 PM. 09/03/16   Esperanza RichtersEdward Saguier, PA-C  diazepam (VALIUM) 5 MG tablet Take 1 tablet (5 mg total) by mouth every 8 (eight) hours as needed for anxiety. Can fill no sooner than October 24, 2016. 10/20/16   Esperanza RichtersEdward Saguier, PA-C  finasteride (PROSCAR) 5 MG tablet Take 5 mg by mouth daily. 09/22/16   Historical Provider, MD  fluticasone (FLONASE) 50 MCG/ACT nasal spray Place 1 spray into both nostrils daily. Patient taking differently: Place 1 spray into both nostrils daily as needed.  12/11/15   Jacquelynn CreePamela S Love, PA-C    Multiple Vitamin (MULTIVITAMIN WITH MINERALS) TABS tablet Take 1 tablet by mouth daily.    Historical Provider, MD  ondansetron (ZOFRAN-ODT) 4 MG disintegrating tablet Take 1 tablet (4 mg total) by mouth every 8 (eight) hours as needed for nausea or vomiting. 10/12/16   Melene Planan Floyd, DO    Family History Family History  Problem Relation Age of Onset  . Colon cancer Mother   . Cerebral aneurysm Father     Social History Social History  Substance Use Topics  . Smoking status: Current Every Day Smoker    Packs/day: 0.50    Years: 60.00    Types: Cigarettes  . Smokeless tobacco: Never Used  . Alcohol use No     Allergies   Penicillins   Review of Systems Review of Systems  Constitutional: Negative for chills and fever.  HENT: Negative for sore throat.   Eyes: Negative for pain and visual disturbance.  Respiratory: Negative for cough.   Cardiovascular: Negative for chest pain and palpitations.  Gastrointestinal: Negative for abdominal pain and vomiting.  Genitourinary: Positive for difficulty urinating and penile pain. Negative for decreased urine volume, discharge, dysuria, flank pain, hematuria, penile swelling, scrotal swelling and testicular pain.  Musculoskeletal: Negative for arthralgias and back pain.  Skin: Negative for color change and rash.  Neurological: Negative for seizures and syncope.  All other systems reviewed and are negative.  Physical Exam Updated Vital Signs BP 121/75 (BP Location: Left Arm)   Pulse (!) 102   Temp 98.7 F (37.1 C) (Oral)   Resp 20   Ht 5\' 8"  (1.727 m)   Wt 62.6 kg   SpO2 96%   BMI 20.98 kg/m   Physical Exam  Constitutional: He appears well-developed and well-nourished.  HENT:  Head: Normocephalic.  Eyes: Conjunctivae are normal.  Neck: Neck supple.  Cardiovascular: Normal rate.   Pulmonary/Chest: Effort normal and breath sounds normal.  Abdominal: He exhibits no distension.  Genitourinary: Circumcised. Penile tenderness  present. No phimosis, paraphimosis or penile erythema. No discharge found.  Genitourinary Comments: Two 3-5 mm skin tears at the urethral opening.   Musculoskeletal: Normal range of motion.  Neurological: He is alert.  Skin: Skin is warm and dry.  Psychiatric: He has a normal mood and affect.  Nursing note and vitals reviewed.  ED Treatments / Results  Labs (all labs ordered are listed, but only abnormal results are displayed) Labs Reviewed - No data to display  EKG  EKG Interpretation None       Radiology No results found.  Procedures Procedures (including critical care time)  DIAGNOSTIC STUDIES: Oxygen Saturation is 96% on RA, adequate by my interpretation.    COORDINATION OF CARE: 4:20 PM Discussed treatment plan with pt's wife at bedside and she agreed to plan.  Medications Ordered in ED Medications - No data to display   Initial Impression / Assessment  and Plan / ED Course  I have reviewed the triage vital signs and the nursing notes.  Pertinent labs & imaging results that were available during my care of the patient were reviewed by me and considered in my medical decision making (see chart for details).   David Duke presented today for penile pain with leaking around his foley catheter.  His pain with urine leakage is most likely due to urine contact with his skin tears.  He was seen here earlier this morning for similar and was given one size larger foley catheter.  The foley was inspected with no obvious defects noted.  He is draining urine into his collection bag and bed side ultrasound was performed by Dr. Donnald Garre to confirm foley is draining.  Discussion was had with patient and his wife and the decision was made to see if the leakage and possible spasms causing leakage abate over the next few days.  Patient was advised to follow up with urology.  Labs from this morning were reviewed and it was felt there is no need for additional or repeat labs.       At this time there does not appear to be any evidence of an acute emergency medical condition and the patient appears stable for discharge with appropriate outpatient follow up.Diagnosis was discussed with patient who verbalizes understanding and is agreeable to discharge. Pt case discussed with Dr. Donnald Garre who agrees with my plan.      Final Clinical Impressions(s) / ED Diagnoses   Final diagnoses:  Problem with Foley catheter, subsequent encounter    New Prescriptions New Prescriptions   No medications on file   I personally performed the services described in this documentation, which was scribed in my presence. The recorded information has been reviewed and is accurate.    Earnstine Regal [place], [place] 12/03/16 1718    Arby Barrette, MD 12/07/16 [phone]

## 2016-12-03 NOTE — Discharge Instructions (Signed)
David Duke can take Tylenol 650 mg every 4 hours as needed for pain. If he has any other problems with Foley catheter he can follow up with his urologist or return to the emergency department if concern for any reason

## 2016-12-09 ENCOUNTER — Emergency Department (HOSPITAL_BASED_OUTPATIENT_CLINIC_OR_DEPARTMENT_OTHER)
Admission: EM | Admit: 2016-12-09 | Discharge: 2016-12-09 | Disposition: A | Discharge status: Home / Self Care | Payer: Medicare Other | Attending: Emergency Medicine | Admitting: Emergency Medicine

## 2016-12-09 ENCOUNTER — Encounter (HOSPITAL_BASED_OUTPATIENT_CLINIC_OR_DEPARTMENT_OTHER): Payer: Self-pay | Admitting: *Deleted

## 2016-12-09 DIAGNOSIS — F17210 Nicotine dependence, cigarettes, uncomplicated: Secondary | ICD-10-CM | POA: Insufficient documentation

## 2016-12-09 DIAGNOSIS — Y733 Surgical instruments, materials and gastroenterology and urology devices (including sutures) associated with adverse incidents: Secondary | ICD-10-CM | POA: Insufficient documentation

## 2016-12-09 DIAGNOSIS — T83098A Other mechanical complication of other indwelling urethral catheter, initial encounter: Secondary | ICD-10-CM | POA: Insufficient documentation

## 2016-12-09 DIAGNOSIS — J449 Chronic obstructive pulmonary disease, unspecified: Secondary | ICD-10-CM | POA: Diagnosis not present

## 2016-12-09 DIAGNOSIS — T83091A Other mechanical complication of indwelling urethral catheter, initial encounter: Secondary | ICD-10-CM

## 2016-12-09 DIAGNOSIS — Z79899 Other long term (current) drug therapy: Secondary | ICD-10-CM | POA: Diagnosis not present

## 2016-12-09 NOTE — ED Triage Notes (Signed)
Foley cathter not draining

## 2016-12-09 NOTE — ED Notes (Signed)
Foley irrigation performed with no difficulties. Foley was draining prior to irrigation and same continued after.

## 2016-12-09 NOTE — Discharge Instructions (Signed)
You were seen in the ED today with obstruction of the foley catheter. This resolved on its own and we provided additional flushing of the catheter. Monitor the foley output closely and return with any new or worsening lower abdominal pain, fever, chills, leaking, or if the foley stops draining again.

## 2016-12-09 NOTE — ED Provider Notes (Signed)
Emergency Department Provider Note   I have reviewed the triage vital signs and the nursing notes.    HISTORY  Chief Complaint Urinary Retention   HPI David Duke is a 82 y.o. male with PMH of AKI, BPH, COPD, HLD, and prior CVA presents to the emergency department for evaluation of Foley catheter complication. Patient states that throughout the evening he has not had drainage into his Foley leg bag. He had some mild lower abdominal discomfort. He denies any blood in the urine, fever, chills. He does not have supplies at home to flush the Foley catheter. The catheter was last changed last week and a larger size catheter was placed at that time. Patient called the paramedics for transport and when he stood to transfer to the ambulance the urine began draining normally into the bag. No radiation of symptoms. No current pain.    Past Medical History:  Diagnosis Date  . AKI (acute kidney injury) (HCC)   . Anxiety   . Blind right eye   . BPH (benign prostatic hypertrophy) with urinary retention   . COPD (chronic obstructive pulmonary disease) (HCC)   . Depression   . High cholesterol   . Macular degeneration of both eyes   . Renal disorder   . Shortness of breath dyspnea   . Stroke [facility])     Patient Active Problem List   Diagnosis Date Noted  . Enterocolitis 09/30/2016  . HLD (hyperlipidemia) 09/30/2016  . Tobacco abuse 09/30/2016  . Nausea vomiting and diarrhea 09/30/2016  . History of stroke in prior 3 months 01/22/2016  . Chronic tension-type headache, not intractable   . Acute frontal sinusitis   . 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 Gait disturbance, post-stroke   . Ataxia, late effect of cerebrovascular disease   . BPH (benign prostatic hyperplasia)   . Urinary retention   . Adjustment disorder with mixed anxiety and depressed mood   . Chronic obstructive pulmonary disease (HCC)   . Cephalalgia   . AKI (acute kidney injury) (HCC)    . Macular degeneration   . Prediabetes   . Hypokalemia   . Absolute anemia   . Aphasia   . Acute encephalopathy 11/25/2015  . Hyponatremia 11/25/2015  . Acute kidney injury (HCC) 11/25/2015  . Depression   . Anxiety   . COPD (chronic obstructive pulmonary disease) (HCC)   . ICH (intracerebral hemorrhage) (HCC) 11/22/2015    Past Surgical History:  Procedure Laterality Date  . NO PAST SURGERIES      Current Outpatient Rx  . Order #: [id number] Class: Normal  . Order #: [id number] Class: Normal  . Order #: [id number] Class: Print  . Order #: [id number] Class: Historical Med  . Order #: [id number] Class: No Print  . Order #: [id number] Class: Historical Med  . Order #: [id number] Class: Print    Allergies Penicillins  Family History  Problem Relation Age of Onset  . Colon cancer Mother   . Cerebral aneurysm Father     Social History Social History  Substance Use Topics  . Smoking status: Current Every Day Smoker    Packs/day: 0.50    Years: 60.00    Types: Cigarettes  . Smokeless tobacco: Never Used  . Alcohol use No    Review of Systems  10-point ROS otherwise negative.  ____________________________________________   PHYSICAL EXAM:  VITAL SIGNS: ED Triage Vitals  Enc Vitals Group     BP 12/09/16 0216 (!) 147/73     Pulse Rate  12/09/16 0216 82     Resp 12/09/16 0216 20     Temp 12/09/16 0216 97.9 F (36.6 C)     Temp Source 12/09/16 0216 Oral     SpO2 12/09/16 0216 99 %     Weight 12/09/16 0216 138 lb (62.6 kg)     Height 12/09/16 0216  (1.727 m)     Pain Score 12/09/16 0239 0   Constitutional: Alert and oriented. Well appearing and in no acute distress. Eyes: Conjunctivae are normal.  Head: Atraumatic. Nose: No congestion/rhinnorhea. Mouth/Throat: Mucous membranes are moist.   Neck: No stridor.  Cardiovascular: Normal rate, regular rhythm. Good peripheral circulation. Grossly normal heart sounds.   Respiratory: Normal respiratory effort.  No  retractions. Lungs CTAB. Gastrointestinal: Soft and nontender. No distention.  Genitourinary: Normal external genitalia. Foley catheter in place. No urethral irritation or discharge. Full leg bag on right thigh. No gross blood.  Musculoskeletal: No lower extremity tenderness nor edema. No gross deformities of extremities. Neurologic:  Normal speech and language. No gross focal neurologic deficits are appreciated.  Skin:  Skin is warm, dry and intact. No rash noted.  ____________________________________________   PROCEDURES  Procedure(s) performed:   Procedures  Limited Ultrasound of bladder  Performed by Dr. Jacqulyn Bath Indication: to assess for urinary retention and/or bladder volume prior to urinary catheter Technique:  Low frequency probe utilized in two planes to assess bladder volume in real-time. Findings: Bladder volume 150 ml Additional findings: Foley bulb in place.  Images were archived electronically    ____________________________________________   INITIAL IMPRESSION / ASSESSMENT AND PLAN / ED COURSE  Pertinent labs & imaging results that were available during my care of the patient were reviewed by me and considered in my medical decision making (see chart for details).  Patient presents to the emergency department for evaluation of Foley complication. He has a leg bag full of yellow urine. No gross hematuria. He does have 2 small areas of sediment in the bag that I can appreciate. This may be the source of his obstruction. Bedside ultrasound shows Foley balloon in the bladder with additional urine that is not drained and the Foley. Plan for Foley catheter irrigation and discharge. No clear indication for urinalysis or blood work at this time as the patient did not have prolonged obstruction symptoms.   At this time, I do not feel there is any life-threatening condition present. I have reviewed and discussed all results (EKG, imaging, lab, urine as appropriate), exam  findings with patient. I have reviewed nursing notes and appropriate previous records.  I feel the patient is safe to be discharged home without further emergent workup. Discussed usual and customary return precautions. Patient and family (if present) verbalize understanding and are comfortable with this plan.  Patient will follow-up with their primary care provider. If they do not have a primary care provider, information for follow-up has been provided to them. All questions have been answered.  ____________________________________________  FINAL CLINICAL IMPRESSION(S) / ED DIAGNOSES  Final diagnoses:  Obstruction of Foley catheter, initial encounter (HCC)     MEDICATIONS GIVEN DURING THIS VISIT:  None  NEW OUTPATIENT MEDICATIONS STARTED DURING THIS VISIT:  None   Note:  This document was prepared using Dragon voice recognition software and may include unintentional dictation errors.  Alona Bene, MD Emergency Medicine    Maia Plan, MD 12/09/16 [phone]

## 2016-12-09 NOTE — ED Notes (Signed)
Pt presents to the ED for possible catheter blockage. Pt states that when he got into the ambulance he began urinating and has been producing urine since.

## 2016-12-15 ENCOUNTER — Encounter (HOSPITAL_BASED_OUTPATIENT_CLINIC_OR_DEPARTMENT_OTHER): Payer: Self-pay | Admitting: Emergency Medicine

## 2016-12-15 ENCOUNTER — Emergency Department (HOSPITAL_BASED_OUTPATIENT_CLINIC_OR_DEPARTMENT_OTHER)
Admission: EM | Admit: 2016-12-15 | Discharge: 2016-12-15 | Disposition: A | Discharge status: Home / Self Care | Payer: Medicare Other | Attending: Emergency Medicine | Admitting: Emergency Medicine

## 2016-12-15 DIAGNOSIS — J449 Chronic obstructive pulmonary disease, unspecified: Secondary | ICD-10-CM | POA: Diagnosis not present

## 2016-12-15 DIAGNOSIS — F17210 Nicotine dependence, cigarettes, uncomplicated: Secondary | ICD-10-CM | POA: Diagnosis not present

## 2016-12-15 DIAGNOSIS — F410 Panic disorder [episodic paroxysmal anxiety] without agoraphobia: Secondary | ICD-10-CM

## 2016-12-15 DIAGNOSIS — Z79899 Other long term (current) drug therapy: Secondary | ICD-10-CM | POA: Insufficient documentation

## 2016-12-15 DIAGNOSIS — F419 Anxiety disorder, unspecified: Secondary | ICD-10-CM | POA: Diagnosis present

## 2016-12-15 LAB — CBC WITH DIFFERENTIAL/PLATELET
Basophils Absolute: 0 10*3/uL (ref 0.0–0.1)
Basophils Relative: 0 %
EOS ABS: 0.4 10*3/uL (ref 0.0–0.7)
Eosinophils Relative: 4 %
HEMATOCRIT: 41.2 % (ref 39.0–52.0)
HEMOGLOBIN: 13.8 g/dL (ref 13.0–17.0)
Lymphocytes Relative: 19 %
Lymphs Abs: 1.7 10*3/uL (ref 0.7–4.0)
MCH: 31.2 pg (ref 26.0–34.0)
MCHC: 33.5 g/dL (ref 30.0–36.0)
MCV: 93 fL (ref 78.0–100.0)
MONOS PCT: 9 %
Monocytes Absolute: 0.8 10*3/uL (ref 0.1–1.0)
NEUTROS ABS: 6.2 10*3/uL (ref 1.7–7.7)
NEUTROS PCT: 68 %
Platelets: 230 10*3/uL (ref 150–400)
RBC: 4.43 MIL/uL (ref 4.22–5.81)
RDW: 13.8 % (ref 11.5–15.5)
WBC: 9.2 10*3/uL (ref 4.0–10.5)

## 2016-12-15 LAB — BASIC METABOLIC PANEL
Anion gap: 8 (ref 5–15)
BUN: 16 mg/dL (ref 6–20)
CALCIUM: 9.2 mg/dL (ref 8.9–10.3)
CO2: 29 mmol/L (ref 22–32)
Chloride: 104 mmol/L (ref 101–111)
Creatinine, Ser: 1.31 mg/dL — ABNORMAL HIGH (ref 0.61–1.24)
GFR calc Af Amer: 58 mL/min — ABNORMAL LOW (ref >60)
GFR calc non Af Amer: 50 mL/min — ABNORMAL LOW (ref >60)
Glucose, Bld: 99 mg/dL (ref 65–99)
POTASSIUM: 4.2 mmol/L (ref 3.5–5.1)
SODIUM: 141 mmol/L (ref 135–145)

## 2016-12-15 MED ORDER — LORAZEPAM 1 MG PO TABS
1.0000 mg | ORAL_TABLET | Freq: Once | ORAL | Status: AC
  Administered 2016-12-15: 1 mg via ORAL
  Filled 2016-12-15: qty 1

## 2016-12-15 NOTE — ED Provider Notes (Signed)
[facility] Provider Note   CSN: [id number] Arrival date & time: 12/15/16  0039     History   Chief Complaint Chief Complaint  Patient presents with  . Anxiety    HPI David Duke is a 82 y.o. male.  Patient is a 82 year old male with past medical history of COPD, prior CVA, early dementia, and anxiety. He presents today complaining of "the worst panic attack of ever had". He stated this evening while sitting at home he became acutely shaky, anxious, short of breath, and felt "dissociated from the world". This feeling lasted for several minutes, then EMS was called. He is now feeling somewhat better. He denies to me he is experiencing any chest pain, fevers, chills, productive cough.  He does have an indwelling Foley catheter which was recently replaced due to obstruction. He was advised to drink increased water which he has been doing for the past week. He reports the catheter has been working appropriately and that the urine has been a clear yellow.   The history is provided by the patient.  Anxiety  This is a new problem. The current episode started 1 to 2 hours ago. The problem occurs constantly. The problem has been gradually improving. Associated symptoms include shortness of breath. Pertinent negatives include no chest pain. Nothing aggravates the symptoms. Nothing relieves the symptoms. He has tried nothing for the symptoms. The treatment provided no relief.    Past Medical History:  Diagnosis Date  . AKI (acute kidney injury) (HCC)   . Anxiety   . Blind right eye   . BPH (benign prostatic hypertrophy) with urinary retention   . COPD (chronic obstructive pulmonary disease) (HCC)   . Depression   . High cholesterol   . Macular degeneration of both eyes   . Renal disorder   . Shortness of breath dyspnea   . Stroke [facility])     Patient Active Problem List   Diagnosis Date Noted  . Enterocolitis 09/30/2016  . HLD (hyperlipidemia) 09/30/2016  . Tobacco  abuse 09/30/2016  . Nausea vomiting and diarrhea 09/30/2016  . History of stroke in prior 3 months 01/22/2016  . Chronic tension-type headache, not intractable   . Acute frontal sinusitis   . 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 Gait disturbance, post-stroke   . Ataxia, late effect of cerebrovascular disease   . BPH (benign prostatic hyperplasia)   . Urinary retention   . Adjustment disorder with mixed anxiety and depressed mood   . Chronic obstructive pulmonary disease (HCC)   . Cephalalgia   . AKI (acute kidney injury) (HCC)   . Macular degeneration   . Prediabetes   . Hypokalemia   . Absolute anemia   . Aphasia   . Acute encephalopathy 11/25/2015  . Hyponatremia 11/25/2015  . Acute kidney injury (HCC) 11/25/2015  . Depression   . Anxiety   . COPD (chronic obstructive pulmonary disease) (HCC)   . ICH (intracerebral hemorrhage) (HCC) 11/22/2015    Past Surgical History:  Procedure Laterality Date  . NO PAST SURGERIES         Home Medications    Prior to Admission medications   Medication Sig Start Date End Date Taking? Authorizing Provider  albuterol (PROVENTIL HFA;VENTOLIN HFA) 108 (90 Base) MCG/ACT inhaler Inhale 2 puffs into the lungs every 4 (four) hours as needed for wheezing or shortness of breath (or cough). 09/23/16   Ramon Dredge Saguier, PA-C  atorvastatin (LIPITOR) 20 MG tablet Take 1  tablet (20 mg total) by mouth daily at 6 PM. 09/03/16   Esperanza Richters, PA-C  diazepam (VALIUM) 5 MG tablet Take 1 tablet (5 mg total) by mouth every 8 (eight) hours as needed for anxiety. Can fill no sooner than October 24, 2016. 10/20/16   Esperanza Richters, PA-C  finasteride (PROSCAR) 5 MG tablet Take 5 mg by mouth daily. 09/22/16   Historical Provider, MD  fluticasone (FLONASE) 50 MCG/ACT nasal spray Place 1 spray into both nostrils daily. Patient taking differently: Place 1 spray into both nostrils daily as needed.  12/11/15   Jacquelynn Cree, PA-C  Multiple  Vitamin (MULTIVITAMIN WITH MINERALS) TABS tablet Take 1 tablet by mouth daily.    Historical Provider, MD  ondansetron (ZOFRAN-ODT) 4 MG disintegrating tablet Take 1 tablet (4 mg total) by mouth every 8 (eight) hours as needed for nausea or vomiting. 10/12/16   Melene Plan, DO    Family History Family History  Problem Relation Age of Onset  . Colon cancer Mother   . Cerebral aneurysm Father     Social History Social History  Substance Use Topics  . Smoking status: Current Every Day Smoker    Packs/day: 0.50    Years: 60.00    Types: Cigarettes  . Smokeless tobacco: Never Used  . Alcohol use No     Allergies   Penicillins   Review of Systems Review of Systems  Respiratory: Positive for shortness of breath.   Cardiovascular: Negative for chest pain.  All other systems reviewed and are negative.    Physical Exam Updated Vital Signs BP (!) 141/79 (BP Location: Left Arm)   Pulse 89   Temp 97.8 F (36.6 C) (Oral)   Resp (!) 22   SpO2 99%   Physical Exam  Constitutional: He is oriented to person, place, and time. He appears well-developed and well-nourished. No distress.  HENT:  Head: Normocephalic and atraumatic.  Mouth/Throat: Oropharynx is clear and moist.  Neck: Normal range of motion. Neck supple.  Cardiovascular: Normal rate and regular rhythm.  Exam reveals no friction rub.   No murmur heard. Pulmonary/Chest: Effort normal and breath sounds normal. No respiratory distress. He has no wheezes. He has no rales.  Abdominal: Soft. Bowel sounds are normal. He exhibits no distension. There is no tenderness.  Musculoskeletal: Normal range of motion. He exhibits no edema.  Neurological: He is alert and oriented to person, place, and time. Coordination normal.  Skin: Skin is warm and dry. He is not diaphoretic.  Psychiatric: His mood appears anxious.  Nursing note and vitals reviewed.    ED Treatments / Results  Labs (all labs ordered are listed, but only abnormal  results are displayed) Labs Reviewed  BASIC METABOLIC PANEL  CBC WITH DIFFERENTIAL/PLATELET    EKG  EKG Interpretation None       Radiology No results found.  Procedures Procedures (including critical care time)  Medications Ordered in ED Medications  LORazepam (ATIVAN) tablet 1 mg (not administered)     Initial Impression / Assessment and Plan / ED Course  I have reviewed the triage vital signs and the nursing notes.  Pertinent labs & imaging results that were available during my care of the patient were reviewed by me and considered in my medical decision making (see chart for details).  Patient presents with complaintsOf feeling anxious and shaky. This started earlier this evening. He believes he was having a panic attack and I see nothing that makes me think otherwise. He is neurologically  intact and laboratory studies are unremarkable. I see nothing else on physical examination that is of concern. His significant other states that he does have some dementia and that may well be related to tonight's episode.  He will be discharged, to return as needed for any problems.  Final Clinical Impressions(s) / ED Diagnoses   Final diagnoses:  None    New Prescriptions New Prescriptions   No medications on file     Geoffery Lyons, MD 12/15/16 [phone]

## 2016-12-15 NOTE — ED Notes (Signed)
Alert, NAD, calm, interactive, resps e/u, speaking in clearly with loose dentures, no dyspnea noted, skin W&D, here for anxiousness episode earlier, foley present, (denies: pain, sob, fever, or nausea), admits to some intermittent dizziness. Family at [facility].

## 2016-12-15 NOTE — ED Notes (Signed)
Denies questions or needs, VSS, steady gait, out with wife, alert, NAD, calm, interactive, drinking coffee.

## 2016-12-15 NOTE — ED Triage Notes (Signed)
PT presents to ED with complaints of anxiety and abdominal pain. Pt from home BIB ems. PT has foley in place and has had a lot of output and is concerned.

## 2016-12-15 NOTE — Discharge Instructions (Signed)
Continue medications as before.  Return to the emergency department if symptoms significantly worsen or change. 

## 2016-12-20 ENCOUNTER — Encounter (HOSPITAL_BASED_OUTPATIENT_CLINIC_OR_DEPARTMENT_OTHER): Payer: Self-pay | Admitting: Emergency Medicine

## 2016-12-20 ENCOUNTER — Emergency Department (HOSPITAL_BASED_OUTPATIENT_CLINIC_OR_DEPARTMENT_OTHER): Payer: Medicare Other

## 2016-12-20 ENCOUNTER — Emergency Department (HOSPITAL_BASED_OUTPATIENT_CLINIC_OR_DEPARTMENT_OTHER)
Admission: EM | Admit: 2016-12-20 | Discharge: 2016-12-20 | Disposition: A | Discharge status: Home / Self Care | Payer: Medicare Other | Attending: Emergency Medicine | Admitting: Emergency Medicine

## 2016-12-20 DIAGNOSIS — T83031A Leakage of indwelling urethral catheter, initial encounter: Secondary | ICD-10-CM | POA: Insufficient documentation

## 2016-12-20 DIAGNOSIS — F17210 Nicotine dependence, cigarettes, uncomplicated: Secondary | ICD-10-CM | POA: Diagnosis not present

## 2016-12-20 DIAGNOSIS — Y829 Unspecified medical devices associated with adverse incidents: Secondary | ICD-10-CM | POA: Insufficient documentation

## 2016-12-20 DIAGNOSIS — H539 Unspecified visual disturbance: Secondary | ICD-10-CM | POA: Insufficient documentation

## 2016-12-20 DIAGNOSIS — J449 Chronic obstructive pulmonary disease, unspecified: Secondary | ICD-10-CM | POA: Diagnosis not present

## 2016-12-20 DIAGNOSIS — Z79899 Other long term (current) drug therapy: Secondary | ICD-10-CM | POA: Diagnosis not present

## 2016-12-20 DIAGNOSIS — M7989 Other specified soft tissue disorders: Secondary | ICD-10-CM | POA: Diagnosis present

## 2016-12-20 DIAGNOSIS — R600 Localized edema: Secondary | ICD-10-CM | POA: Diagnosis not present

## 2016-12-20 NOTE — ED Triage Notes (Signed)
Pt states he has a leak in his leg bag of his urinary catheter and he woke up with R leg swelling.

## 2016-12-20 NOTE — ED Notes (Signed)
New leg bag applied.

## 2016-12-20 NOTE — ED Notes (Signed)
No leaking noted from leg bag, cath. Intact , draining cloudy yellow urine. Wife at bedside,

## 2016-12-20 NOTE — Discharge Instructions (Signed)
Doppler studies were negative for any blood clots in both legs. Kidney function on April 9 was without any significant change. No evidence of severe renal failure. Recommend follow-up with primary care doctor. Leg bag was replaced.

## 2016-12-20 NOTE — ED Notes (Signed)
Pt in hospital gown

## 2016-12-20 NOTE — ED Provider Notes (Signed)
[facility] Provider Note   CSN: [id number] Arrival date & time: 12/20/16  0919     History   Chief Complaint Chief Complaint  Patient presents with  . Leg Swelling  . catheter problem    HPI David GOETTL is a 82 y.o. male.  Patient seen on April 9 had lab work done. Lab work was reviewed. Patient felt that his leg bag and mouth Foley catheter has been placed long-standing was leaking this morning. May have been the valve was not close properly. No leaking since then. Complaint of bilateral leg swelling right greater than left. No chest pain no shortness of breath. Patient's spouse concerned that may be related to a deep vein thrombosis. Patient's lab work from April 9 showed no significant renal failure.      Past Medical History:  Diagnosis Date  . AKI (acute kidney injury) (HCC)   . Anxiety   . Blind right eye   . BPH (benign prostatic hypertrophy) with urinary retention   . COPD (chronic obstructive pulmonary disease) (HCC)   . Depression   . High cholesterol   . Macular degeneration of both eyes   . Renal disorder   . Shortness of breath dyspnea   . Stroke [facility])     Patient Active Problem List   Diagnosis Date Noted  . Enterocolitis 09/30/2016  . HLD (hyperlipidemia) 09/30/2016  . Tobacco abuse 09/30/2016  . Nausea vomiting and diarrhea 09/30/2016  . History of stroke in prior 3 months 01/22/2016  . Chronic tension-type headache, not intractable   . Acute frontal sinusitis   . 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 Gait disturbance, post-stroke   . Ataxia, late effect of cerebrovascular disease   . BPH (benign prostatic hyperplasia)   . Urinary retention   . Adjustment disorder with mixed anxiety and depressed mood   . Chronic obstructive pulmonary disease (HCC)   . Cephalalgia   . AKI (acute kidney injury) (HCC)   . Macular degeneration   . Prediabetes   . Hypokalemia   . Absolute anemia   . Aphasia   .  Acute encephalopathy 11/25/2015  . Hyponatremia 11/25/2015  . Acute kidney injury (HCC) 11/25/2015  . Depression   . Anxiety   . COPD (chronic obstructive pulmonary disease) (HCC)   . ICH (intracerebral hemorrhage) (HCC) 11/22/2015    Past Surgical History:  Procedure Laterality Date  . NO PAST SURGERIES         Home Medications    Prior to Admission medications   Medication Sig Start Date End Date Taking? Authorizing Provider  albuterol (PROVENTIL HFA;VENTOLIN HFA) 108 (90 Base) MCG/ACT inhaler Inhale 2 puffs into the lungs every 4 (four) hours as needed for wheezing or shortness of breath (or cough). 09/23/16   Ramon Dredge Saguier, PA-C  atorvastatin (LIPITOR) 20 MG tablet Take 1 tablet (20 mg total) by mouth daily at 6 PM. 09/03/16   Esperanza Richters, PA-C  diazepam (VALIUM) 5 MG tablet Take 1 tablet (5 mg total) by mouth every 8 (eight) hours as needed for anxiety. Can fill no sooner than October 24, 2016. 10/20/16   Esperanza Richters, PA-C  finasteride (PROSCAR) 5 MG tablet Take 5 mg by mouth daily. 09/22/16   Historical Provider, MD  fluticasone (FLONASE) 50 MCG/ACT nasal spray Place 1 spray into both nostrils daily. Patient taking differently: Place 1 spray into both nostrils daily as needed.  12/11/15   Jacquelynn Cree, PA-C  Multiple Vitamin (  MULTIVITAMIN WITH MINERALS) TABS tablet Take 1 tablet by mouth daily.    Historical Provider, MD  ondansetron (ZOFRAN-ODT) 4 MG disintegrating tablet Take 1 tablet (4 mg total) by mouth every 8 (eight) hours as needed for nausea or vomiting. 10/12/16   Melene Plan, DO    Family History Family History  Problem Relation Age of Onset  . Colon cancer Mother   . Cerebral aneurysm Father     Social History Social History  Substance Use Topics  . Smoking status: Current Every Day Smoker    Packs/day: 0.50    Years: 60.00    Types: Cigarettes  . Smokeless tobacco: Never Used  . Alcohol use No     Allergies   Penicillins   Review of  Systems Review of Systems  Constitutional: Negative for fever.  HENT: Negative for congestion.   Eyes: Positive for visual disturbance.  Respiratory: Negative for shortness of breath.   Cardiovascular: Positive for leg swelling. Negative for chest pain.  Genitourinary: Negative for hematuria.  Musculoskeletal: Negative for back pain.  Neurological: Negative for headaches.  Hematological: Does not bruise/bleed easily.  Psychiatric/Behavioral: Negative for confusion.     Physical Exam Updated Vital Signs BP 121/68 (BP Location: Right Arm)   Pulse 70   Temp 98.7 F (37.1 C) (Oral)   Resp 16   Ht  (1.727 m)   Wt 61.7 kg   SpO2 94%   BMI 20.68 kg/m   Physical Exam  Constitutional: He is oriented to person, place, and time. He appears well-developed and well-nourished. No distress.  HENT:  Head: Normocephalic and atraumatic.  Mouth/Throat: Oropharynx is clear and moist.  Eyes: Conjunctivae are normal.  Neck: Neck supple.  Cardiovascular: Normal rate and regular rhythm.   Pulmonary/Chest: Effort normal and breath sounds normal. No respiratory distress.  Abdominal: Soft. Bowel sounds are normal. There is no tenderness.  Genitourinary:  Genitourinary Comments: Foley catheter and leg bag in place. Seems to be draining properly. No leak from the Foley leg bag but there is a tear along the side of it.  Musculoskeletal: Normal range of motion. He exhibits edema.  Bilateral lower extremity trimming the swelling right greater than left. Dorsalis pedis pulse and cap refill intact. No deformity. No erythema.  Neurological: He is alert and oriented to person, place, and time. No cranial nerve deficit or sensory deficit. He exhibits normal muscle tone. Coordination normal.  Skin: Skin is warm.  Nursing note and vitals reviewed.    ED Treatments / Results  Labs (all labs ordered are listed, but only abnormal results are displayed) Labs Reviewed - No data to display   Results for  orders placed or performed during the hospital encounter of 12/15/16  Basic metabolic panel  Result Value Ref Range   Sodium 141 135 - 145 mmol/L   Potassium 4.2 3.5 - 5.1 mmol/L   Chloride 104 101 - 111 mmol/L   CO2 29 22 - 32 mmol/L   Glucose, Bld 99 65 - 99 mg/dL   BUN 16 6 - 20 mg/dL   Creatinine, Ser 4.09 (H) 0.61 - 1.24 mg/dL   Calcium 9.2 8.9 - 81.1 mg/dL   GFR calc non Af Amer 50 (L) >60 mL/min   GFR calc Af Amer 58 (L) >60 mL/min   Anion gap 8 5 - 15  CBC with Differential  Result Value Ref Range   WBC 9.2 4.0 - 10.5 K/uL   RBC 4.43 4.22 - 5.81 MIL/uL   Hemoglobin  13.8 13.0 - 17.0 g/dL   HCT 19.1 47.8 - 29.5 %   MCV 93.0 78.0 - 100.0 fL   MCH 31.2 26.0 - 34.0 pg   MCHC 33.5 30.0 - 36.0 g/dL   RDW 62.1 30.8 - 65.7 %   Platelets 230 150 - 400 K/uL   Neutrophils Relative % 68 %   Neutro Abs 6.2 1.7 - 7.7 K/uL   Lymphocytes Relative 19 %   Lymphs Abs 1.7 0.7 - 4.0 K/uL   Monocytes Relative 9 %   Monocytes Absolute 0.8 0.1 - 1.0 K/uL   Eosinophils Relative 4 %   Eosinophils Absolute 0.4 0.0 - 0.7 K/uL   Basophils Relative 0 %   Basophils Absolute 0.0 0.0 - 0.1 K/uL     EKG  EKG Interpretation None       Radiology US Venous Img Lower Bilateral  Result Date: 12/20/2016 CLINICAL DATA:  83 year old male EXAM: BILATERAL LOWER EXTREMITY VENOUS DOPPLER ULTRASOUND TECHNIQUE: Gray-scale sonography with graded compression, as well as color Doppler and duplex ultrasound were performed to evaluate the lower extremity deep venous systems from the level of the common femoral vein and including the common femoral, femoral, profunda femoral, popliteal and calf veins including the posterior tibial, peroneal and gastrocnemius veins when visible. The superficial great saphenous vein was also interrogated. Spectral Doppler was utilized to evaluate flow at rest and with distal augmentation maneuvers in the common femoral, femoral and popliteal veins. COMPARISON:  None. FINDINGS: RIGHT  LOWER EXTREMITY Common Femoral Vein: No evidence of thrombus. Normal compressibility, respiratory phasicity and response to augmentation. Saphenofemoral Junction: No evidence of thrombus. Normal compressibility and flow on color Doppler imaging. Profunda Femoral Vein: No evidence of thrombus. Normal compressibility and flow on color Doppler imaging. Femoral Vein: No evidence of thrombus. Normal compressibility, respiratory phasicity and response to augmentation. Popliteal Vein: No evidence of thrombus. Normal compressibility, respiratory phasicity and response to augmentation. Calf Veins: No evidence of thrombus. Normal compressibility and flow on color Doppler imaging. Superficial Great Saphenous Vein: No evidence of thrombus. Normal compressibility and flow on color Doppler imaging. Venous Reflux:  None. Other Findings:  None. LEFT LOWER EXTREMITY Common Femoral Vein: No evidence of thrombus. Normal compressibility, respiratory phasicity and response to augmentation. Saphenofemoral Junction: No evidence of thrombus. Normal compressibility and flow on color Doppler imaging. Profunda Femoral Vein: No evidence of thrombus. Normal compressibility and flow on color Doppler imaging. Femoral Vein: No evidence of thrombus. Normal compressibility, respiratory phasicity and response to augmentation. Popliteal Vein: No evidence of thrombus. Normal compressibility, respiratory phasicity and response to augmentation. Calf Veins: Peroneal vein could not be visualized. No evidence of thrombus. Normal compressibility and flow on color Doppler imaging. Superficial Great Saphenous Vein: No evidence of thrombus. Normal compressibility and flow on color Doppler imaging. Venous Reflux:  None. Other Findings:  None. IMPRESSION: No evidence of deep venous thrombosis in either lower extremity. Electronically Signed   By: Trudie Reed M.D.   On: 12/20/2016 12:02    Procedures Procedures (including critical care  time)  Medications Ordered in ED Medications - No data to display   Initial Impression / Assessment and Plan / ED Course  I have reviewed the triage vital signs and the nursing notes.  Pertinent labs & imaging results that were available during my care of the patient were reviewed by me and considered in my medical decision making (see chart for details).     Doppler studies of both lower extremities without evidence of deep  vein thrombosis. In addition kidney function was normal on April 9. See no reason to repeat it. Patient also without any respiratory distress. Oxygen saturation mid 90s to upper 90s. Patient had a tear in his Foley bag but it was not leaking but replaced it for him. Patient's complaint of the leaking Foley bag or leg bag was probably due to the valve coming partially open.  Final Clinical Impressions(s) / ED Diagnoses   Final diagnoses:  Bilateral lower extremity edema    New Prescriptions New Prescriptions   No medications on file     [name], MD 12/20/16 1254

## 2016-12-20 NOTE — ED Notes (Signed)
In [place] 

## 2016-12-28 ENCOUNTER — Encounter (HOSPITAL_BASED_OUTPATIENT_CLINIC_OR_DEPARTMENT_OTHER): Payer: Self-pay

## 2016-12-28 ENCOUNTER — Emergency Department (HOSPITAL_BASED_OUTPATIENT_CLINIC_OR_DEPARTMENT_OTHER)
Admission: EM | Admit: 2016-12-28 | Discharge: 2016-12-28 | Disposition: A | Discharge status: Home / Self Care | Payer: Medicare Other | Attending: Emergency Medicine | Admitting: Emergency Medicine

## 2016-12-28 ENCOUNTER — Emergency Department (HOSPITAL_BASED_OUTPATIENT_CLINIC_OR_DEPARTMENT_OTHER): Payer: Medicare Other

## 2016-12-28 DIAGNOSIS — R0602 Shortness of breath: Secondary | ICD-10-CM | POA: Insufficient documentation

## 2016-12-28 DIAGNOSIS — J449 Chronic obstructive pulmonary disease, unspecified: Secondary | ICD-10-CM | POA: Diagnosis not present

## 2016-12-28 DIAGNOSIS — F17210 Nicotine dependence, cigarettes, uncomplicated: Secondary | ICD-10-CM | POA: Insufficient documentation

## 2016-12-28 DIAGNOSIS — Z79899 Other long term (current) drug therapy: Secondary | ICD-10-CM | POA: Diagnosis not present

## 2016-12-28 DIAGNOSIS — R202 Paresthesia of skin: Secondary | ICD-10-CM | POA: Diagnosis not present

## 2016-12-28 LAB — CBC WITH DIFFERENTIAL/PLATELET
BASOS ABS: 0 10*3/uL (ref 0.0–0.1)
Basophils Relative: 1 %
EOS PCT: 6 %
Eosinophils Absolute: 0.4 10*3/uL (ref 0.0–0.7)
HEMATOCRIT: 41.3 % (ref 39.0–52.0)
Hemoglobin: 13.6 g/dL (ref 13.0–17.0)
LYMPHS PCT: 20 %
Lymphs Abs: 1.6 10*3/uL (ref 0.7–4.0)
MCH: 30.6 pg (ref 26.0–34.0)
MCHC: 32.9 g/dL (ref 30.0–36.0)
MCV: 93 fL (ref 78.0–100.0)
MONO ABS: 0.6 10*3/uL (ref 0.1–1.0)
MONOS PCT: 8 %
Neutro Abs: 5.1 10*3/uL (ref 1.7–7.7)
Neutrophils Relative %: 65 %
PLATELETS: 250 10*3/uL (ref 150–400)
RBC: 4.44 MIL/uL (ref 4.22–5.81)
RDW: 13.9 % (ref 11.5–15.5)
WBC: 7.7 10*3/uL (ref 4.0–10.5)

## 2016-12-28 LAB — BASIC METABOLIC PANEL
Anion gap: 7 (ref 5–15)
BUN: 14 mg/dL (ref 6–20)
CHLORIDE: 105 mmol/L (ref 101–111)
CO2: 29 mmol/L (ref 22–32)
CREATININE: 1.43 mg/dL — AB (ref 0.61–1.24)
Calcium: 9 mg/dL (ref 8.9–10.3)
GFR calc Af Amer: 52 mL/min — ABNORMAL LOW (ref >60)
GFR calc non Af Amer: 45 mL/min — ABNORMAL LOW (ref >60)
GLUCOSE: 88 mg/dL (ref 65–99)
Potassium: 4.2 mmol/L (ref 3.5–5.1)
SODIUM: 141 mmol/L (ref 135–145)

## 2016-12-28 LAB — BRAIN NATRIURETIC PEPTIDE: B Natriuretic Peptide: 24.1 pg/mL (ref 0.0–100.0)

## 2016-12-28 LAB — TROPONIN I

## 2016-12-28 MED ORDER — DIAZEPAM 5 MG PO TABS: 5.0000 mg | ORAL_TABLET | Freq: Once | ORAL | Status: DC

## 2016-12-28 MED ORDER — ALBUTEROL SULFATE HFA 108 (90 BASE) MCG/ACT IN AERS: 1.0000 | INHALATION_SPRAY | Freq: Four times a day (QID) | RESPIRATORY_TRACT | 0 refills | Status: AC | PRN

## 2016-12-28 MED ORDER — DIAZEPAM 2 MG PO TABS
2.0000 mg | ORAL_TABLET | Freq: Once | ORAL | Status: AC
  Administered 2016-12-28: 2 mg via ORAL
  Filled 2016-12-28: qty 1

## 2016-12-28 NOTE — ED Provider Notes (Signed)
Emergency Department Provider Note  By signing my name below, I, Avnee Patel, attest that this documentation has been prepared under the direction and in the presence of Maia Plan, MD  Electronically Signed: Clovis Pu, ED Scribe. 12/28/16. 5:00 PM.  I have reviewed the triage vital signs and the nursing notes.   HISTORY  Chief Complaint Shortness of Breath   HPI David Duke is a 82 y.o. male, with a PMHx of COPD, stroke and anxiety on diazepam, complaining of acute onset SOB beginning today. He also reports intermittent palpitations, bilateral hand numbness, the sensation of being off balance and an occasional cough. He has taken 2 and a half diazepam with temporary relief. No aggravating factors noted. Pt denies chest pain, current palpitations or any other associated symptoms. No other complaints noted. The patient's wife notes intermittent symptoms similar to this in the recent past. They saw the PCP yesterday and have been referred to a Geriatric Psychiatry team.    Past Medical History:  Diagnosis Date  . AKI (acute kidney injury) (HCC)   . Anxiety   . Blind right eye   . BPH (benign prostatic hypertrophy) with urinary retention   . COPD (chronic obstructive pulmonary disease) (HCC)   . Depression   . High cholesterol   . Macular degeneration of both eyes   . Renal disorder   . Shortness of breath dyspnea   . Stroke [facility])     Patient Active Problem List   Diagnosis Date Noted  . Enterocolitis 09/30/2016  . HLD (hyperlipidemia) 09/30/2016  . Tobacco abuse 09/30/2016  . Nausea vomiting and diarrhea 09/30/2016  . History of stroke in prior 3 months 01/22/2016  . Chronic tension-type headache, not intractable   . Acute frontal sinusitis   . 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 Gait disturbance, post-stroke   . Ataxia, late effect of cerebrovascular disease   . BPH (benign prostatic hyperplasia)   . Urinary retention   .  Adjustment disorder with mixed anxiety and depressed mood   . Chronic obstructive pulmonary disease (HCC)   . Cephalalgia   . AKI (acute kidney injury) (HCC)   . Macular degeneration   . Prediabetes   . Hypokalemia   . Absolute anemia   . Aphasia   . Acute encephalopathy 11/25/2015  . Hyponatremia 11/25/2015  . Acute kidney injury (HCC) 11/25/2015  . Depression   . Anxiety   . COPD (chronic obstructive pulmonary disease) (HCC)   . ICH (intracerebral hemorrhage) (HCC) 11/22/2015    Past Surgical History:  Procedure Laterality Date  . NO PAST SURGERIES      Current Outpatient Rx  . Order #: [id number] Class: Print  . Order #: [id number] Class: Normal  . Order #: [id number] Class: Print  . Order #: [id number] Class: Historical Med  . Order #: [id number] Class: No Print  . Order #: [id number] Class: Historical Med  . Order #: [id number] Class: Print    Allergies Penicillins  Family History  Problem Relation Age of Onset  . Colon cancer Mother   . Cerebral aneurysm Father     Social History Social History  Substance Use Topics  . Smoking status: Current Every Day Smoker    Packs/day: 0.50    Years: 60.00    Types: Cigarettes  . Smokeless tobacco: Never Used  . Alcohol use No    Review of Systems  Constitutional: No fever/chills Eyes: No visual changes. ENT: No sore throat. Cardiovascular: Denies chest pain. Respiratory: +shortness of  breath. Gastrointestinal: No abdominal pain.  No nausea, no vomiting.  No diarrhea.  No constipation. Genitourinary: Negative for dysuria. Musculoskeletal: Negative for back pain. Skin: Negative for rash. Neurological: Negative for headaches, focal weakness or numbness. Psychiatric: Worsening anxiety symptoms at home.   10-point ROS otherwise negative.  ____________________________________________   PHYSICAL EXAM:  VITAL SIGNS: ED Triage Vitals  Enc Vitals Group     BP 12/28/16 1639 (!) 155/96     Pulse Rate 12/28/16 1639 95      Resp 12/28/16 1639 (!) 24     Temp 12/28/16 1639 98.3 F (36.8 C)     Temp Source 12/28/16 1639 Oral     SpO2 12/28/16 1639 98 %     Weight 12/28/16 1640 136 lb (61.7 kg)     Height 12/28/16 1640  (1.727 m)   Constitutional: Alert and oriented. Well appearing and in no acute distress. Eyes: Conjunctivae are normal.  Head: Atraumatic. Nose: No congestion/rhinnorhea. Mouth/Throat: Mucous membranes are moist. Neck: No stridor.   Cardiovascular: Normal rate, regular rhythm. Good peripheral circulation. Grossly normal heart sounds.   Respiratory: Normal respiratory effort.  No retractions. Lungs CTAB. Gastrointestinal: Soft and nontender. No distention.  Musculoskeletal: No lower extremity tenderness nor edema. No gross deformities of extremities. Neurologic:  Normal speech and language. No gross focal neurologic deficits are appreciated. Normal CN exam 2-12. Mild bilateral hand tremor worse with use. No pronator drift. Slow gait but otherwise normal.  Skin:  Skin is warm, dry and intact. No rash noted. Psychiatric: Mood and affect are normal. Slight confusion at times. Speech and behavior are normal.  ____________________________________________   LABS (all labs ordered are listed, but only abnormal results are displayed)  Labs Reviewed  BASIC METABOLIC PANEL - Abnormal; Notable for the following:       Result Value   Creatinine, Ser 1.43 (*)    GFR calc non Af Amer 45 (*)    GFR calc Af Amer 52 (*)    All other components within normal limits  TROPONIN I  CBC WITH DIFFERENTIAL/PLATELET  BRAIN NATRIURETIC PEPTIDE   ____________________________________________  EKG   EKG Interpretation  Date/Time:  Sunday December 28 2016 16:42:37 EDT Ventricular Rate:  90 PR Interval:    QRS Duration: 106 QT Interval:  360 QTC Calculation: 441 R Axis:   -71 Text Interpretation:  Sinus rhythm Incomplete RBBB and LAFB Right ventricular hypertrophy No STEMI  Confirmed by Dara Camargo MD, Christophr Calix  [phone]) on 12/28/2016 4:45:25 PM       ____________________________________________  RADIOLOGY  Dg Chest 2 View  Result Date: 12/28/2016 CLINICAL DATA:  Shortness of breath and congestion. EXAM: CHEST  2 VIEW COMPARISON:  09/29/2016 FINDINGS: AP lordotic and lateral views of the chest show hyperexpansion without edema or focal airspace consolidation. No pleural effusion. The cardiopericardial silhouette is within normal limits for size. The visualized bony structures of the thorax are intact. Telemetry leads overlie the chest. IMPRESSION: Emphysema.  No acute findings. Electronically Signed   By: Kennith Center M.D.   On: 12/28/2016 17:34    ____________________________________________   PROCEDURES  Procedure(s) performed:   Procedures  None ____________________________________________   INITIAL IMPRESSION / ASSESSMENT AND PLAN / ED COURSE  Pertinent labs & imaging results that were available during my care of the patient were reviewed by me and considered in my medical decision making (see chart for details).  Patient presents to the ED with intermittent SOB episodes, bilateral hand tingling, and anxious feelings. Has been  prescribed Valium to take 4 x daily for the last year. Symptoms were improved initially but now having worsening symptoms over the last several weeks. No new/worsening symptoms today. No CP. Labs are largely unremarkable. Normal CXR and unchanged EKG. Patient does have emphysematous lung changes and faint wheezing that may be playing a role in his intermittent dyspnea but with no specific triggers or exertional symptoms have lower suspicion that this is the primary cause. Patient having additional SOB symptoms prior to discharge. Discussed case with patient and wife and offered observational admission but the patient would like to return home. He saw his PCP yesterday for the same and has a referral for psychiatry evaluation. Nothing on labs, imaging, or exam to stop  the patient from pursuing outpatient evaluation. Discussed return precautions in detail. Patient and wife are in agreement with plan at discharge.   At this time, I do not feel there is any life-threatening condition present. I have reviewed and discussed all results (EKG, imaging, lab, urine as appropriate), exam findings with patient. I have reviewed nursing notes and appropriate previous records.  I feel the patient is safe to be discharged home without further emergent workup. Discussed usual and customary return precautions. Patient and family (if present) verbalize understanding and are comfortable with this plan.  Patient will follow-up with their primary care provider. If they do not have a primary care provider, information for follow-up has been provided to them. All questions have been answered.  ____________________________________________  FINAL CLINICAL IMPRESSION(S) / ED DIAGNOSES  Final diagnoses:  Shortness of breath  Paresthesia of both hands     MEDICATIONS GIVEN DURING THIS VISIT:  Medications  diazepam (VALIUM) tablet 2 mg (2 mg Oral Given 12/28/16 1743)     NEW OUTPATIENT MEDICATIONS STARTED DURING THIS VISIT:  None   Note:  This document was prepared using Dragon voice recognition software and may include unintentional dictation errors.  Alona Bene, MD Emergency Medicine  I personally performed the services described in this documentation, which was scribed in my presence. The recorded information has been reviewed and is accurate.       Maia Plan, MD 12/29/16 1535

## 2016-12-28 NOTE — ED Notes (Signed)
PT states he feels short of breath with activity while trying to walk out of ED. Dr Jacqulyn Bath notified.

## 2016-12-28 NOTE — ED Notes (Signed)
Pt discharged to home with wife. NAD.  

## 2016-12-28 NOTE — ED Triage Notes (Signed)
Pt presents with multiple complaints. Pt sts feeling "palpitations earlier". Also sts being unable to "inhale and exhale fully." Reports bilateral hand numbness. Pt appears to be hyperventilating during assessment.

## 2016-12-28 NOTE — ED Notes (Signed)
ED Provider at bedside. 

## 2016-12-28 NOTE — ED Notes (Signed)
Wife is calling their ride home.

## 2016-12-28 NOTE — Discharge Instructions (Signed)
You were seen in the ED today with hand numbness and difficulty breathing. We did not find a clear cause of your symptoms but your labs work and chest x-ray were normal. Return to the ED with any numbness/tingling on one side of your body, chest pain, or worsening difficulty breathing.

## 2016-12-28 NOTE — ED Notes (Addendum)
D/c'd by primary RN, as they were leaving, ptand wife found this RN and reported concerns about chest tightness and sob, and coordination for home health. Encouraged to return to room to have RN and MD re-see them, agreeable. CLA, RN and JL, MD notified.

## 2016-12-28 NOTE — ED Notes (Signed)
Patient transported to X-ray 

## 2017-01-07 ENCOUNTER — Telehealth: Payer: Self-pay | Admitting: Medical

## 2017-01-07 NOTE — Telephone Encounter (Signed)
Pt no longer my pt. He was dismissed he had some abnormal ct in past and was to have repeat studies he never got those done. If you would talk to patient wife and notify her that  new pcp needs be aware to order any further test. Please make a copy of his last 2 ct scans. Offer for her to pick up so she can take to David Duke new primary care. Document she was advise please.  I am bringing this up since CT brought to my attention he did not have follow up studies.   So give them copies of 09-29-2016 and 10-12-2016 ct studies.  Regarding David Duke mrn [phone]  I sent Ashlee a separate e mail to call. Epic would not let me forward this to pt since he was discharge. State caution?

## 2017-02-08 ENCOUNTER — Emergency Department (HOSPITAL_BASED_OUTPATIENT_CLINIC_OR_DEPARTMENT_OTHER)
Admission: EM | Admit: 2017-02-08 | Discharge: 2017-02-08 | Disposition: A | Discharge status: Home / Self Care | Payer: Medicare Other | Attending: Emergency Medicine | Admitting: Emergency Medicine

## 2017-02-08 ENCOUNTER — Emergency Department (HOSPITAL_BASED_OUTPATIENT_CLINIC_OR_DEPARTMENT_OTHER): Payer: Medicare Other

## 2017-02-08 ENCOUNTER — Encounter (HOSPITAL_BASED_OUTPATIENT_CLINIC_OR_DEPARTMENT_OTHER): Payer: Self-pay | Admitting: Emergency Medicine

## 2017-02-08 DIAGNOSIS — Z960 Presence of urogenital implants: Secondary | ICD-10-CM | POA: Diagnosis not present

## 2017-02-08 DIAGNOSIS — Z79899 Other long term (current) drug therapy: Secondary | ICD-10-CM | POA: Insufficient documentation

## 2017-02-08 DIAGNOSIS — J449 Chronic obstructive pulmonary disease, unspecified: Secondary | ICD-10-CM | POA: Insufficient documentation

## 2017-02-08 DIAGNOSIS — F17210 Nicotine dependence, cigarettes, uncomplicated: Secondary | ICD-10-CM | POA: Insufficient documentation

## 2017-02-08 DIAGNOSIS — N390 Urinary tract infection, site not specified: Secondary | ICD-10-CM

## 2017-02-08 DIAGNOSIS — T83511A Infection and inflammatory reaction due to indwelling urethral catheter, initial encounter: Secondary | ICD-10-CM | POA: Insufficient documentation

## 2017-02-08 DIAGNOSIS — Y732 Prosthetic and other implants, materials and accessory gastroenterology and urology devices associated with adverse incidents: Secondary | ICD-10-CM | POA: Insufficient documentation

## 2017-02-08 DIAGNOSIS — R4182 Altered mental status, unspecified: Secondary | ICD-10-CM | POA: Diagnosis present

## 2017-02-08 HISTORY — DX: Unspecified dementia, unspecified severity, without behavioral disturbance, psychotic disturbance, mood disturbance, and anxiety: F03.90

## 2017-02-08 LAB — CBC
HEMATOCRIT: 40.8 % (ref 39.0–52.0)
HEMOGLOBIN: 14 g/dL (ref 13.0–17.0)
MCH: 31.5 pg (ref 26.0–34.0)
MCHC: 34.3 g/dL (ref 30.0–36.0)
MCV: 91.7 fL (ref 78.0–100.0)
Platelets: 244 10*3/uL (ref 150–400)
RBC: 4.45 MIL/uL (ref 4.22–5.81)
RDW: 13.7 % (ref 11.5–15.5)
WBC: 9.7 10*3/uL (ref 4.0–10.5)

## 2017-02-08 LAB — BASIC METABOLIC PANEL
Anion gap: 7 (ref 5–15)
BUN: 21 mg/dL — AB (ref 6–20)
CALCIUM: 8.8 mg/dL — AB (ref 8.9–10.3)
CHLORIDE: 103 mmol/L (ref 101–111)
CO2: 26 mmol/L (ref 22–32)
CREATININE: 1.4 mg/dL — AB (ref 0.61–1.24)
GFR calc Af Amer: 53 mL/min — ABNORMAL LOW (ref >60)
GFR calc non Af Amer: 46 mL/min — ABNORMAL LOW (ref >60)
Glucose, Bld: 92 mg/dL (ref 65–99)
Potassium: 3.4 mmol/L — ABNORMAL LOW (ref 3.5–5.1)
Sodium: 136 mmol/L (ref 135–145)

## 2017-02-08 LAB — URINALYSIS, MICROSCOPIC (REFLEX)

## 2017-02-08 LAB — URINALYSIS, ROUTINE W REFLEX MICROSCOPIC
BILIRUBIN URINE: NEGATIVE
Glucose, UA: NEGATIVE mg/dL
KETONES UR: NEGATIVE mg/dL
Nitrite: POSITIVE — AB
PH: 6 (ref 5.0–8.0)
Protein, ur: NEGATIVE mg/dL
SPECIFIC GRAVITY, URINE: 1.003 — AB (ref 1.005–1.030)

## 2017-02-08 MED ORDER — CIPROFLOXACIN HCL 500 MG PO TABS: 500.0000 mg | ORAL_TABLET | Freq: Two times a day (BID) | ORAL | 0 refills | Status: DC

## 2017-02-08 MED ORDER — CIPROFLOXACIN HCL 500 MG PO TABS
500.0000 mg | ORAL_TABLET | Freq: Once | ORAL | Status: AC
  Administered 2017-02-08: 500 mg via ORAL
  Filled 2017-02-08: qty 1

## 2017-02-08 MED ORDER — LIDOCAINE HCL 2 % EX GEL
1.0000 "application " | Freq: Once | CUTANEOUS | Status: AC
  Administered 2017-02-08: 1 via URETHRAL
  Filled 2017-02-08: qty 20

## 2017-02-08 NOTE — Discharge Instructions (Signed)
Return to the ED with any concerns including vomitng and not able to keep down liquids, decreased level of alertness/lethargy, fever/chills, or any other alarming symptoms

## 2017-02-08 NOTE — ED Notes (Signed)
Pt returned from CT °

## 2017-02-08 NOTE — ED Triage Notes (Signed)
Pt to ED via EMS; wife reports pt was confused around 0730 today. Pt A & O, but takes some time to answer orientation questions. Denies pain.

## 2017-02-08 NOTE — ED Notes (Signed)
[facility] called @ 1236.

## 2017-02-08 NOTE — ED Notes (Signed)
Patient transported to X-ray 

## 2017-02-08 NOTE — ED Provider Notes (Signed)
[facility]   CSN: [id number] Arrival date & time: 02/08/17  0930     History   Chief Complaint Chief Complaint  Patient presents with  . Altered Mental Status    HPI David Duke is a 82 y.o. male.  HPI  A LEVEL 5 CAVEAT PERTAINS DUE TO HX OF DEMENTIA.  Pt presenting with wife concern for episode of confusion this morning.  She states he is back to his baseline on arrival, but this morning he was pacing the floor and seemed confused.  He was saying "what do I do?" over and over.  She states he has good and bad days, but seemed more confused this morning.  He does have an indwelling foley catheter.  Hx of UTIs.  No fever.  No vomiting.  Has otherwise been at his baseline.  No focal weakness.  Episode of confusion seems to have resolved.    Past Medical History:  Diagnosis Date  . AKI (acute kidney injury) (HCC)   . Anxiety   . Blind right eye   . BPH (benign prostatic hypertrophy) with urinary retention   . COPD (chronic obstructive pulmonary disease) (HCC)   . Dementia   . Depression   . High cholesterol   . Macular degeneration of both eyes   . Renal disorder   . Shortness of breath dyspnea   . Stroke [facility])     Patient Active Problem List   Diagnosis Date Noted  . Enterocolitis 09/30/2016  . HLD (hyperlipidemia) 09/30/2016  . Tobacco abuse 09/30/2016  . Nausea vomiting and diarrhea 09/30/2016  . History of stroke in prior 3 months 01/22/2016  . Chronic tension-type headache, not intractable   . Acute frontal sinusitis   . 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 Gait disturbance, post-stroke   . Ataxia, late effect of cerebrovascular disease   . BPH (benign prostatic hyperplasia)   . Urinary retention   . Adjustment disorder with mixed anxiety and depressed mood   . Chronic obstructive pulmonary disease (HCC)   . Cephalalgia   . AKI (acute kidney injury) (HCC)   . Macular degeneration   . Prediabetes   .  Hypokalemia   . Absolute anemia   . Aphasia   . Acute encephalopathy 11/25/2015  . Hyponatremia 11/25/2015  . Acute kidney injury (HCC) 11/25/2015  . Depression   . Anxiety   . COPD (chronic obstructive pulmonary disease) (HCC)   . ICH (intracerebral hemorrhage) (HCC) 11/22/2015    Past Surgical History:  Procedure Laterality Date  . NO PAST SURGERIES         Home Medications    Prior to Admission medications   Medication Sig Start Date End Date Taking? Authorizing Provider  albuterol (PROVENTIL HFA;VENTOLIN HFA) 108 (90 Base) MCG/ACT inhaler Inhale 1-2 puffs into the lungs every 6 (six) hours as needed for wheezing or shortness of breath. 12/28/16   Long, Arlyss Repress, MD  atorvastatin (LIPITOR) 20 MG tablet Take 1 tablet (20 mg total) by mouth daily at 6 PM. 09/03/16   Saguier, Ramon Dredge, PA-C  ciprofloxacin (CIPRO) 500 MG tablet Take 1 tablet (500 mg total) by mouth every 12 (twelve) hours. 02/08/17   Jerelyn Scott, MD  diazepam (VALIUM) 5 MG tablet Take 1 tablet (5 mg total) by mouth every 8 (eight) hours as needed for anxiety. Can fill no sooner than October 24, 2016. 10/20/16   Saguier, Ramon Dredge, PA-C  finasteride (PROSCAR) 5 MG tablet Take  5 mg by mouth daily. 09/22/16   [provider]  fluticasone (FLONASE) 50 MCG/ACT nasal spray Place 1 spray into both nostrils daily. Patient taking differently: Place 1 spray into both nostrils daily as needed.  12/11/15   Love, [name], PA-C  Multiple Vitamin (MULTIVITAMIN WITH MINERALS) TABS tablet Take 1 tablet by mouth daily.    [provider]  ondansetron (ZOFRAN-ODT) 4 MG disintegrating tablet Take 1 tablet (4 mg total) by mouth every 8 (eight) hours as needed for nausea or vomiting. 10/12/16   Melene Plan, DO    Family History Family History  Problem Relation Age of Onset  . Colon cancer Mother   . Cerebral aneurysm Father     Social History Social History  Substance Use Topics  . Smoking status: Current Every Day  Smoker    Packs/day: 0.50    Years: 60.00    Types: Cigarettes  . Smokeless tobacco: Never Used  . Alcohol use No     Allergies   Penicillins   Review of Systems Review of Systems  UNABLE TO OBTAIN ROS DUE TO LEVEL 5 CAVEAT   Physical Exam Updated Vital Signs BP 115/72   Pulse 68   Temp 97.8 F (36.6 C) (Oral)   Resp 17   Ht 5\' 8"  (1.727 m)   Wt 62.6 kg (138 lb)   SpO2 93%   BMI 20.98 kg/m  Vitals reviewed Physical Exam Physical Examination: General appearance - alert, disheveled appearing, and in no distress Mental status - alert, oriented to person, place, not to time Eyes - no conjunctival injection, no scleral icterus Chest - clear to auscultation, no wheezes, rales or rhonchi, symmetric air entry Heart - normal rate, regular rhythm, normal S1, S2, no murmurs, rubs, clicks or gallops Abdomen - soft, nontender, nondistended, no masses or organomegaly GU Male - no penile lesions or discharge, no testicular masses or tenderness, foley catheter in place Neurological - alert, oriented x 2, no cranial nerve defects, strength 5/5 in extremities x 4, sensation intact Extremities - peripheral pulses normal, no pedal edema, no clubbing or cyanosis Skin - normal coloration and turgor, no rashes  ED Treatments / Results  Labs (all labs ordered are listed, but only abnormal results are displayed) Labs Reviewed  BASIC METABOLIC PANEL - Abnormal; Notable for the following:       Result Value   Potassium 3.4 (*)    BUN 21 (*)    Creatinine, Ser 1.40 (*)    Calcium 8.8 (*)    GFR calc non Af Amer 46 (*)    GFR calc Af Amer 53 (*)    All other components within normal limits  URINALYSIS, ROUTINE W REFLEX MICROSCOPIC - Abnormal; Notable for the following:    APPearance CLOUDY (*)    Specific Gravity, Urine 1.003 (*)    Hgb urine dipstick TRACE (*)    Nitrite POSITIVE (*)    Leukocytes, UA LARGE (*)    All other components within normal limits  URINALYSIS, MICROSCOPIC  (REFLEX) - Abnormal; Notable for the following:    Bacteria, UA MANY (*)    Squamous Epithelial / LPF 0-5 (*)    All other components within normal limits  URINE CULTURE  CBC    EKG  EKG Interpretation  Date/Time:  Sunday February 08 2017 09:35:34 EDT Ventricular Rate:  74 PR Interval:    QRS Duration: 110 QT Interval:  396 QTC Calculation: 440 R Axis:   -73 Text Interpretation:  Sinus  rhythm Incomplete RBBB and LAFB Right ventricular hypertrophy No significant change since last tracing Confirmed by Jerelyn ScottLinker, Wayden Schwertner [phone]([id number]) on 02/08/2017 9:53:01 AM       Radiology Dg Chest 2 View  Result Date: 02/08/2017 CLINICAL DATA:  Cough, COPD EXAM: CHEST  2 VIEW COMPARISON:  12/28/2016 FINDINGS: There is hyperinflation of the lungs compatible with COPD. Heart and mediastinal contours are within normal limits. No focal opacities or effusions. No acute bony abnormality. IMPRESSION: COPD.  No active disease. Electronically Signed   By: Charlett NoseKevin  Dover M.D.   On: 02/08/2017 11:26   Ct Head Wo Contrast  Result Date: 02/08/2017 CLINICAL DATA:  82 year old male with altered mental status and confusion today. EXAM: CT HEAD WITHOUT CONTRAST TECHNIQUE: Contiguous axial images were obtained from the base of the skull through the vertex without intravenous contrast. COMPARISON:  09/18/2016 and prior CTs FINDINGS: Brain: No evidence of acute infarction, hemorrhage, hydrocephalus, extra-axial collection or mass lesion/mass effect. Atrophy, chronic small-vessel white matter ischemic changes and left occipital encephalomalacia again noted. Vascular: No hyperdense vessel or unexpected calcification. Skull: Normal. Negative for fracture or focal lesion. Sinuses/Orbits: No acute finding. Other: None. IMPRESSION: No evidence of acute intracranial abnormality. Atrophy, chronic small-vessel white matter ischemic changes and left occipital encephalomalacia. Electronically Signed   By: Harmon PierJeffrey  Hu M.D.   On: 02/08/2017 11:35     Procedures Procedures (including critical care time)  Medications Ordered in ED Medications  ciprofloxacin (CIPRO) tablet 500 mg (500 mg Oral Given 02/08/17 1211)  lidocaine (XYLOCAINE) 2 % jelly 1 application (1 application Urethral Provided for home use 02/08/17 1249)     Initial Impression / Assessment and Plan / ED Course  I have reviewed the triage vital signs and the nursing notes.  Pertinent labs & imaging results that were available during my care of the patient were reviewed by me and considered in my medical decision making (see chart for details).     Pt presenting with c/o increased confusion this morning per wife.  Pt has hx of dementia.  Wife was concerned because he was more confused than his baseline which has been associated with UTIs for him in the past.  He does have an indwelling foley catheter.  Workup reassuring in the ED with the exception of urine with nitrite positive as well as leuks and bacteria- dirty UA is associated with foley- however with AMS as well as positive nitrite which is not always positive in this patient per chart review will go ahead and start on abx- urine culture sent.  He is back to his baseline currently.  Wife is comfortable with plan for discharge.  Discharged with strict return precautions. Close outpatient followup  Final Clinical Impressions(s) / ED Diagnoses   Final diagnoses:  Urinary tract infection associated with indwelling urethral catheter, initial encounter [facility](HCC)    New Prescriptions Discharge Medication List as of 02/08/2017 12:29 PM    START taking these medications   Details  ciprofloxacin (CIPRO) 500 MG tablet Take 1 tablet (500 mg total) by mouth every 12 (twelve) hours., Starting Sun 02/08/2017, Print         Jerelyn ScottLinker, Ebonie Westerlund, MD 02/12/17 [phone]

## 2017-02-08 NOTE — ED Notes (Signed)
Family at bedside. 

## 2017-02-10 LAB — URINE CULTURE

## 2017-02-14 ENCOUNTER — Encounter (HOSPITAL_BASED_OUTPATIENT_CLINIC_OR_DEPARTMENT_OTHER): Payer: Self-pay

## 2017-02-14 ENCOUNTER — Emergency Department (HOSPITAL_BASED_OUTPATIENT_CLINIC_OR_DEPARTMENT_OTHER): Payer: Medicare Other

## 2017-02-14 ENCOUNTER — Encounter (HOSPITAL_BASED_OUTPATIENT_CLINIC_OR_DEPARTMENT_OTHER): Payer: Self-pay | Admitting: *Deleted

## 2017-02-14 ENCOUNTER — Emergency Department (HOSPITAL_BASED_OUTPATIENT_CLINIC_OR_DEPARTMENT_OTHER)
Admission: EM | Admit: 2017-02-14 | Discharge: 2017-02-15 | Disposition: A | Discharge status: Home / Self Care | Payer: Medicare Other | Attending: Emergency Medicine | Admitting: Emergency Medicine

## 2017-02-14 ENCOUNTER — Emergency Department (HOSPITAL_BASED_OUTPATIENT_CLINIC_OR_DEPARTMENT_OTHER)
Admission: EM | Admit: 2017-02-14 | Discharge: 2017-02-14 | Disposition: A | Discharge status: Home / Self Care | Payer: Medicare Other | Attending: Emergency Medicine | Admitting: Emergency Medicine

## 2017-02-14 DIAGNOSIS — Y731 Therapeutic (nonsurgical) and rehabilitative gastroenterology and urology devices associated with adverse incidents: Secondary | ICD-10-CM | POA: Diagnosis not present

## 2017-02-14 DIAGNOSIS — J449 Chronic obstructive pulmonary disease, unspecified: Secondary | ICD-10-CM | POA: Diagnosis not present

## 2017-02-14 DIAGNOSIS — F0390 Unspecified dementia without behavioral disturbance: Secondary | ICD-10-CM | POA: Diagnosis not present

## 2017-02-14 DIAGNOSIS — Z79899 Other long term (current) drug therapy: Secondary | ICD-10-CM | POA: Insufficient documentation

## 2017-02-14 DIAGNOSIS — R300 Dysuria: Secondary | ICD-10-CM | POA: Insufficient documentation

## 2017-02-14 DIAGNOSIS — Z7951 Long term (current) use of inhaled steroids: Secondary | ICD-10-CM | POA: Insufficient documentation

## 2017-02-14 DIAGNOSIS — Y732 Prosthetic and other implants, materials and accessory gastroenterology and urology devices associated with adverse incidents: Secondary | ICD-10-CM | POA: Insufficient documentation

## 2017-02-14 DIAGNOSIS — Z8673 Personal history of transient ischemic attack (TIA), and cerebral infarction without residual deficits: Secondary | ICD-10-CM | POA: Insufficient documentation

## 2017-02-14 DIAGNOSIS — T83021D Displacement of indwelling urethral catheter, subsequent encounter: Secondary | ICD-10-CM | POA: Insufficient documentation

## 2017-02-14 DIAGNOSIS — F17210 Nicotine dependence, cigarettes, uncomplicated: Secondary | ICD-10-CM | POA: Insufficient documentation

## 2017-02-14 DIAGNOSIS — N179 Acute kidney failure, unspecified: Secondary | ICD-10-CM | POA: Insufficient documentation

## 2017-02-14 DIAGNOSIS — R1030 Lower abdominal pain, unspecified: Secondary | ICD-10-CM | POA: Diagnosis present

## 2017-02-14 DIAGNOSIS — T839XXA Unspecified complication of genitourinary prosthetic device, implant and graft, initial encounter: Secondary | ICD-10-CM

## 2017-02-14 DIAGNOSIS — T839XXD Unspecified complication of genitourinary prosthetic device, implant and graft, subsequent encounter: Secondary | ICD-10-CM

## 2017-02-14 DIAGNOSIS — R102 Pelvic and perineal pain: Secondary | ICD-10-CM

## 2017-02-14 LAB — CBC WITH DIFFERENTIAL/PLATELET
BASOS ABS: 0 10*3/uL (ref 0.0–0.1)
Basophils Relative: 0 %
EOS PCT: 2 %
Eosinophils Absolute: 0.2 10*3/uL (ref 0.0–0.7)
HEMATOCRIT: 39.3 % (ref 39.0–52.0)
HEMOGLOBIN: 13.6 g/dL (ref 13.0–17.0)
LYMPHS ABS: 2.8 10*3/uL (ref 0.7–4.0)
LYMPHS PCT: 25 %
MCH: 31.6 pg (ref 26.0–34.0)
MCHC: 34.6 g/dL (ref 30.0–36.0)
MCV: 91.2 fL (ref 78.0–100.0)
Monocytes Absolute: 0.8 10*3/uL (ref 0.1–1.0)
Monocytes Relative: 7 %
NEUTROS ABS: 7.4 10*3/uL (ref 1.7–7.7)
NEUTROS PCT: 66 %
PLATELETS: 254 10*3/uL (ref 150–400)
RBC: 4.31 MIL/uL (ref 4.22–5.81)
RDW: 13.6 % (ref 11.5–15.5)
WBC: 11.2 10*3/uL — AB (ref 4.0–10.5)

## 2017-02-14 LAB — URINALYSIS, ROUTINE W REFLEX MICROSCOPIC
Bilirubin Urine: NEGATIVE
Glucose, UA: NEGATIVE mg/dL
Ketones, ur: NEGATIVE mg/dL
Nitrite: POSITIVE — AB
Protein, ur: NEGATIVE mg/dL
Specific Gravity, Urine: 1.006 (ref 1.005–1.030)
pH: 5.5 (ref 5.0–8.0)

## 2017-02-14 LAB — BASIC METABOLIC PANEL
ANION GAP: 10 (ref 5–15)
BUN: 22 mg/dL — ABNORMAL HIGH (ref 6–20)
CHLORIDE: 104 mmol/L (ref 101–111)
CO2: 24 mmol/L (ref 22–32)
Calcium: 8.8 mg/dL — ABNORMAL LOW (ref 8.9–10.3)
Creatinine, Ser: 1.43 mg/dL — ABNORMAL HIGH (ref 0.61–1.24)
GFR, EST AFRICAN AMERICAN: 52 mL/min — AB (ref >60)
GFR, EST NON AFRICAN AMERICAN: 45 mL/min — AB (ref >60)
Glucose, Bld: 95 mg/dL (ref 65–99)
POTASSIUM: 3.5 mmol/L (ref 3.5–5.1)
Sodium: 138 mmol/L (ref 135–145)

## 2017-02-14 LAB — URINALYSIS, MICROSCOPIC (REFLEX)

## 2017-02-14 MED ORDER — SULFAMETHOXAZOLE-TRIMETHOPRIM 800-160 MG PO TABS: 1.0000 | ORAL_TABLET | Freq: Two times a day (BID) | ORAL | 0 refills | Status: AC

## 2017-02-14 NOTE — ED Provider Notes (Signed)
[facility]   CSN: [id number] Arrival date & time: 02/14/17  0609     History   Chief Complaint Chief Complaint  Patient presents with  . Dysuria    HPI David Duke is a 82 y.o. male.  Patient is an 82 year old male with past medical history of dementia, prior CVA with resultant neurogenic bladder with indwelling Foley catheter. He presents today for evaluation of lower abdominal discomfort. When he woke from sleep this morning, he felt lower abdominal pressure which worsens when he stood. He then felt as though something "let loose", then urine drained rapidly into his leg bag. His pain was relieved when the urine drained. He denies any fevers or chills.\  He was seen here earlier this week with similar issues. Had a urinalysis that was suggestive of a UTI and was started on Cipro.   The history is provided by the patient.  Dysuria   This is a new problem. Episode onset: This morning. The problem has not changed since onset.The pain is moderate. There has been no fever. Pertinent negatives include no chills, no nausea, no hematuria and no urgency. He has tried nothing for the symptoms.    Past Medical History:  Diagnosis Date  . AKI (acute kidney injury) (HCC)   . Anxiety   . Blind right eye   . BPH (benign prostatic hypertrophy) with urinary retention   . COPD (chronic obstructive pulmonary disease) (HCC)   . Dementia   . Depression   . High cholesterol   . Macular degeneration of both eyes   . Renal disorder   . Shortness of breath dyspnea   . Stroke [facility](HCC)     Patient Active Problem List   Diagnosis Date Noted  . Enterocolitis 09/30/2016  . HLD (hyperlipidemia) 09/30/2016  . Tobacco abuse 09/30/2016  . Nausea vomiting and diarrhea 09/30/2016  . History of stroke in prior 3 months 01/22/2016  . Chronic tension-type headache, not intractable   . Acute frontal sinusitis   . Cognitive deficits as late effect of cerebrovascular  disease   . Gait disturbance, post-stroke   . Ataxia, late effect of cerebrovascular disease   . BPH (benign prostatic hyperplasia)   . Urinary retention   . Adjustment disorder with mixed anxiety and depressed mood   . Chronic obstructive pulmonary disease (HCC)   . Cephalalgia   . AKI (acute kidney injury) (HCC)   . Macular degeneration   . Prediabetes   . Hypokalemia   . Absolute anemia   . Aphasia   . Acute encephalopathy 11/25/2015  . Hyponatremia 11/25/2015  . Acute kidney injury (HCC) 11/25/2015  . Depression   . Anxiety   . COPD (chronic obstructive pulmonary disease) (HCC)   . ICH (intracerebral hemorrhage) (HCC) 11/22/2015    Past Surgical History:  Procedure Laterality Date  . NO PAST SURGERIES         Home Medications    Prior to Admission medications   Medication Sig Start Date End Date Taking? Authorizing Provider  albuterol (PROVENTIL HFA;VENTOLIN HFA) 108 (90 Base) MCG/ACT inhaler Inhale 1-2 puffs into the lungs every 6 (six) hours as needed for wheezing or shortness of breath. 12/28/16   Long, Arlyss RepressJoshua G, MD  atorvastatin (LIPITOR) 20 MG tablet Take 1 tablet (20 mg total) by mouth daily at 6 PM. 09/03/16   Saguier, Ramon DredgeEdward, PA-C  ciprofloxacin (CIPRO) 500 MG tablet Take 1 tablet (500 mg total) by mouth every 12 (twelve) hours. 02/08/17  Jerelyn Scott, MD  diazepam (VALIUM) 5 MG tablet Take 1 tablet (5 mg total) by mouth every 8 (eight) hours as needed for anxiety. Can fill no sooner than October 24, 2016. 10/20/16   Saguier, Ramon Dredge, PA-C  finasteride (PROSCAR) 5 MG tablet Take 5 mg by mouth daily. 09/22/16   [provider]  fluticasone (FLONASE) 50 MCG/ACT nasal spray Place 1 spray into both nostrils daily. Patient taking differently: Place 1 spray into both nostrils daily as needed.  12/11/15   Love, [name], PA-C  Multiple Vitamin (MULTIVITAMIN WITH MINERALS) TABS tablet Take 1 tablet by mouth daily.    [provider]  ondansetron  (ZOFRAN-ODT) 4 MG disintegrating tablet Take 1 tablet (4 mg total) by mouth every 8 (eight) hours as needed for nausea or vomiting. 10/12/16   Melene Plan, DO    Family History Family History  Problem Relation Age of Onset  . Colon cancer Mother   . Cerebral aneurysm Father     Social History Social History  Substance Use Topics  . Smoking status: Current Every Day Smoker    Packs/day: 0.50    Years: 60.00    Types: Cigarettes  . Smokeless tobacco: Never Used  . Alcohol use No     Allergies   Penicillins   Review of Systems Review of Systems  Constitutional: Negative for chills.  Gastrointestinal: Negative for nausea.  Genitourinary: Positive for dysuria. Negative for hematuria and urgency.  All other systems reviewed and are negative.    Physical Exam Updated Vital Signs BP 124/69 (BP Location: Right Arm)   Pulse 80   Temp 98 F (36.7 C) (Oral)   Resp 20   Ht 5\' 8"  (1.727 m)   Wt 62.6 kg (138 lb)   SpO2 97%   BMI 20.98 kg/m   Physical Exam  Constitutional: He is oriented to person, place, and time. He appears well-developed and well-nourished. No distress.  HENT:  Head: Normocephalic and atraumatic.  Mouth/Throat: Oropharynx is clear and moist.  Neck: Normal range of motion. Neck supple.  Cardiovascular: Normal rate and regular rhythm.  Exam reveals no friction rub.   No murmur heard. Pulmonary/Chest: Effort normal and breath sounds normal. No respiratory distress. He has no wheezes. He has no rales.  Abdominal: Soft. Bowel sounds are normal. He exhibits no distension. There is tenderness. There is no rebound and no guarding.  There is mild suprapubic tenderness to palpation.  Musculoskeletal: Normal range of motion. He exhibits no edema.  Neurological: He is alert and oriented to person, place, and time. Coordination normal.  Skin: Skin is warm and dry. He is not diaphoretic.  Nursing note and vitals reviewed.    ED Treatments / Results  Labs (all  labs ordered are listed, but only abnormal results are displayed) Labs Reviewed  BASIC METABOLIC PANEL - Abnormal; Notable for the following:       Result Value   BUN 22 (*)    Creatinine, Ser 1.43 (*)    Calcium 8.8 (*)    GFR calc non Af Amer 45 (*)    GFR calc Af Amer 52 (*)    All other components within normal limits  CBC WITH DIFFERENTIAL/PLATELET - Abnormal; Notable for the following:    WBC 11.2 (*)    All other components within normal limits  URINALYSIS, ROUTINE W REFLEX MICROSCOPIC - Abnormal; Notable for the following:    Hgb urine dipstick SMALL (*)    Nitrite POSITIVE (*)  Leukocytes, UA SMALL (*)    All other components within normal limits  URINALYSIS, MICROSCOPIC (REFLEX) - Abnormal; Notable for the following:    Bacteria, UA FEW (*)    Squamous Epithelial / LPF 0-5 (*)    All other components within normal limits  URINE CULTURE    EKG  EKG Interpretation None       Radiology No results found.  Procedures Procedures (including critical care time)  Medications Ordered in ED Medications - No data to display   Initial Impression / Assessment and Plan / ED Course  I have reviewed the triage vital signs and the nursing notes.  Pertinent labs & imaging results that were available during my care of the patient were reviewed by me and considered in my medical decision making (see chart for details).  Patient presents here with what sounds like a Foley catheter outlet obstruction. It sounds as though when he stood up the obstruction relieved, and he is now feeling better and is symptom-free. His laboratory studies are reassuring and urinalysis shows significant improvement from his previous visit when he was prescribed Cipro.  At this point, I nothing additional that needs to be done. His bladder was scanned and was empty and he does not appear to be retaining. He will be discharged, to follow-up with his urologist to discuss the issue further.  Final  Clinical Impressions(s) / ED Diagnoses   Final diagnoses:  None    New Prescriptions New Prescriptions   No medications on file     Geoffery Lyons, MD 02/14/17 [phone]

## 2017-02-14 NOTE — Discharge Instructions (Signed)
Stop taking Cipro. Start taking Bactrim today as prescribed.  You are to follow-up with your urologist this week to discuss your catheter and abdominal issues.

## 2017-02-14 NOTE — ED Provider Notes (Signed)
[facility] Provider Note   CSN: [id number] Arrival date & time: 02/14/17  2120     History   Chief Complaint Chief Complaint  Patient presents with  . Abdominal Pain    HPI RUTH TULLY is a 82 y.o. male.  Patient is an 83 year old male with past medical history of anxiety, dementia, prior CVA. He has an indwelling Foley catheter since his stroke secondary to what sounds like a neurogenic bladder. He was seen here this morning with complaints of lower abdominal discomfort and irritation to his pain is from his Foley catheter. Laboratory studies were reassuring and urinalysis was not significantly abnormal. He was already taking Cipro and was discharged with instructions to continue this. He returns tonight stating that his abdominal discomfort has returned. He tells me his discomfort is worse after he takes his Cipro.  This patient is somewhat of a difficult historian secondary to his dementia, prior CVA, and anxiety.   The history is provided by the patient.  Abdominal Pain   This is a new problem. The current episode started yesterday. The problem occurs constantly. The problem has been gradually worsening. The pain is located in the suprapubic region. The pain is moderate. Pertinent negatives include fever, diarrhea, nausea and vomiting. Nothing aggravates the symptoms. Nothing relieves the symptoms.    Past Medical History:  Diagnosis Date  . AKI (acute kidney injury) (HCC)   . Anxiety   . Blind right eye   . BPH (benign prostatic hypertrophy) with urinary retention   . COPD (chronic obstructive pulmonary disease) (HCC)   . Dementia   . Depression   . High cholesterol   . Macular degeneration of both eyes   . Renal disorder   . Shortness of breath dyspnea   . Stroke [facility])    has residual slurred speech    Patient Active Problem List   Diagnosis Date Noted  . Enterocolitis 09/30/2016  . HLD (hyperlipidemia) 09/30/2016  . Tobacco abuse 09/30/2016    . Nausea vomiting and diarrhea 09/30/2016  . History of stroke in prior 3 months 01/22/2016  . Chronic tension-type headache, not intractable   . Acute frontal sinusitis   . 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 Gait disturbance, post-stroke   . Ataxia, late effect of cerebrovascular disease   . BPH (benign prostatic hyperplasia)   . Urinary retention   . Adjustment disorder with mixed anxiety and depressed mood   . Chronic obstructive pulmonary disease (HCC)   . Cephalalgia   . AKI (acute kidney injury) (HCC)   . Macular degeneration   . Prediabetes   . Hypokalemia   . Absolute anemia   . Aphasia   . Acute encephalopathy 11/25/2015  . Hyponatremia 11/25/2015  . Acute kidney injury (HCC) 11/25/2015  . Depression   . Anxiety   . COPD (chronic obstructive pulmonary disease) (HCC)   . ICH (intracerebral hemorrhage) (HCC) 11/22/2015    Past Surgical History:  Procedure Laterality Date  . NO PAST SURGERIES         Home Medications    Prior to Admission medications   Medication Sig Start Date End Date Taking? Authorizing Provider  albuterol (PROVENTIL HFA;VENTOLIN HFA) 108 (90 Base) MCG/ACT inhaler Inhale 1-2 puffs into the lungs every 6 (six) hours as needed for wheezing or shortness of breath. 12/28/16   Long, Arlyss Repress, MD  atorvastatin (LIPITOR) 20 MG tablet Take 1 tablet (20 mg total) by mouth daily at 6  PM. 09/03/16   Saguier, Ramon DredgeEdward, PA-C  ciprofloxacin (CIPRO) 500 MG tablet Take 1 tablet (500 mg total) by mouth every 12 (twelve) hours. 02/08/17   Jerelyn ScottLinker, Martha, MD  diazepam (VALIUM) 5 MG tablet Take 1 tablet (5 mg total) by mouth every 8 (eight) hours as needed for anxiety. Can fill no sooner than October 24, 2016. 10/20/16   Saguier, Ramon DredgeEdward, PA-C  finasteride (PROSCAR) 5 MG tablet Take 5 mg by mouth daily. 09/22/16   [provider]  fluticasone (FLONASE) 50 MCG/ACT nasal spray Place 1 spray into both nostrils daily. Patient taking  differently: Place 1 spray into both nostrils daily as needed.  12/11/15   Love, Evlyn KannerPamela S, PA-C  Multiple Vitamin (MULTIVITAMIN WITH MINERALS) TABS tablet Take 1 tablet by mouth daily.    [provider]  ondansetron (ZOFRAN-ODT) 4 MG disintegrating tablet Take 1 tablet (4 mg total) by mouth every 8 (eight) hours as needed for nausea or vomiting. 10/12/16   Melene PlanFloyd, Dan, DO    Family History Family History  Problem Relation Age of Onset  . Colon cancer Mother   . Cerebral aneurysm Father     Social History Social History  Substance Use Topics  . Smoking status: Current Every Day Smoker    Packs/day: 0.50    Years: 60.00    Types: Cigarettes  . Smokeless tobacco: Never Used  . Alcohol use No     Allergies   Penicillins   Review of Systems Review of Systems  Constitutional: Negative for fever.  Gastrointestinal: Positive for abdominal pain. Negative for diarrhea, nausea and vomiting.  All other systems reviewed and are negative.    Physical Exam Updated Vital Signs BP 127/87 (BP Location: Right Arm)   Pulse 79   Temp 98.8 F (37.1 C) (Oral)   Resp 20   Ht 5\' 8"  (1.727 m)   Wt 63.5 kg (140 lb)   SpO2 97%   BMI 21.29 kg/m   Physical Exam  Constitutional: He is oriented to person, place, and time. He appears well-developed and well-nourished. No distress.  HENT:  Head: Normocephalic and atraumatic.  Mouth/Throat: Oropharynx is clear and moist.  Neck: Normal range of motion. Neck supple.  Cardiovascular: Normal rate and regular rhythm.  Exam reveals no friction rub.   No murmur heard. Pulmonary/Chest: Effort normal and breath sounds normal. No respiratory distress. He has no wheezes. He has no rales.  Abdominal: Soft. Bowel sounds are normal. He exhibits no distension. There is tenderness. There is no rebound and no guarding.  There is mild suprapubic tenderness to palpation. There is no rebound and no guarding. There is an indwelling Foley catheter that  appears normal. There is clear urine in the leg bag.  Musculoskeletal: Normal range of motion. He exhibits no edema.  Neurological: He is alert and oriented to person, place, and time. Coordination normal.  Skin: Skin is warm and dry. He is not diaphoretic.  Nursing note and vitals reviewed.    ED Treatments / Results  Labs (all labs ordered are listed, but only abnormal results are displayed) Labs Reviewed  COMPREHENSIVE METABOLIC PANEL  CBC WITH DIFFERENTIAL/PLATELET    EKG  EKG Interpretation None       Radiology No results found.  Procedures Procedures (including critical care time)  Medications Ordered in ED Medications - No data to display   Initial Impression / Assessment and Plan / ED Course  I have reviewed the triage vital signs and the nursing notes.  Pertinent  labs & imaging results that were available during my care of the patient were reviewed by me and considered in my medical decision making (see chart for details).  Patient presents for second time today by EMS with complaints of lower abdominal pain. He has an indwelling Foley catheter that appears to be draining. This morning, bladder scan revealed no bladder distention or retention.  The patient is somewhat a difficult historian secondary to dementia and prior CVA. It was my intention to perform a CT scan to further evaluate the cause of his lower abdominal discomfort. The patient was initially receptive to this, then when he was given his oral contrast, refused. I return to the room and discussed this with him and his wife again. He then informed me that he only feels discomfort when he takes his Cipro and believes that this is a side effect of this medication. He is requesting a different antibiotic. This will be changed to Bactrim and he will be discharged at his request.  I have advised him to follow-up with his urologist this week to discuss his abdominal and catheter issues.  Final Clinical  Impressions(s) / ED Diagnoses   Final diagnoses:  None    New Prescriptions New Prescriptions   No medications on file     Geoffery Lyons, MD 02/15/17 0041

## 2017-02-14 NOTE — ED Triage Notes (Addendum)
[facility] reports patient c/o lower abd pain, states dx with UTI one week ago has not completed ABT - has chronic foley catheter that is leaking - seen here today for same - pt from home with family. VS: 148/82, 70 HR, 18. Wife present but [facility] with [facility].

## 2017-02-14 NOTE — ED Notes (Signed)
EDP back in to speak with pt/family about plan/orders.

## 2017-02-14 NOTE — ED Notes (Signed)
Pt and wife verbalize understanding of dc instructions and deny any further needs at this time 

## 2017-02-14 NOTE — ED Notes (Signed)
Dr. Judd Lienelo EDP into room, prior to RN assessment, see MD notes, pending orders. Speaking with pt/family.   Alert, NAD, calm, interactive, resps e/u, speaking in clear complete sentences, no dyspnea noted.

## 2017-02-14 NOTE — Discharge Instructions (Signed)
Continue Cipro as previously prescribed.  Follow-up with your urologist in the next 3-4 days, and return to the ER if symptoms significantly worsen or change.

## 2017-02-14 NOTE — ED Triage Notes (Signed)
Pt here with burning and irritation at tip of penis and feels like his bladder is full.

## 2017-02-14 NOTE — ED Notes (Addendum)
[facility]. Pt declining CT at this time. EDP aware.

## 2017-02-15 NOTE — ED Notes (Signed)
Pt and wife given d/c instructions as per chart. Rx x 1. Verbalizes understanding. No questions. 

## 2017-02-15 NOTE — ED Notes (Signed)
Blood/imaging orders cancelled, pt placed for d/c.

## 2017-02-17 LAB — URINE CULTURE: Culture: 50000 — AB

## 2017-02-18 ENCOUNTER — Telehealth: Payer: Self-pay | Admitting: Emergency Medicine

## 2017-02-18 NOTE — Progress Notes (Signed)
ED Antimicrobial Stewardship Positive Culture Follow Up   David Duke is an 82 y.o. male who presented to [facility]/2017 with a chief complaint of  Chief Complaint  Patient presents with  . Abdominal Pain    Recent Results (from the past 720 hour(s))  Urine culture     Status: Abnormal   Collection Time: 02/08/17 10:40 AM  Result Value Ref Range Status   Specimen Description URINE, CLEAN CATCH  Final   Special Requests NONE  Final   Culture MULTIPLE SPECIES PRESENT, SUGGEST RECOLLECTION (A)  Final   Report Status 02/10/2017 FINAL  Final  Urine culture     Status: Abnormal   Collection Time: 02/14/17  6:25 AM  Result Value Ref Range Status   Specimen Description URINE, CATHETERIZED  Final   Special Requests NONE  Final   Culture (A)  Final    50,000 COLONIES/mL STAPHYLOCOCCUS SPECIES (COAGULASE NEGATIVE) 80,000 COLONIES/mL YEAST    Report Status 02/17/2017 FINAL  Final   Organism ID, Bacteria STAPHYLOCOCCUS SPECIES (COAGULASE NEGATIVE) (A)  Final      Susceptibility   Staphylococcus species (coagulase negative) - MIC*    CIPROFLOXACIN >=8 RESISTANT Resistant     GENTAMICIN <=0.5 SENSITIVE Sensitive     NITROFURANTOIN <=16 SENSITIVE Sensitive     OXACILLIN >=4 RESISTANT Resistant     TETRACYCLINE >=16 RESISTANT Resistant     VANCOMYCIN 2 SENSITIVE Sensitive     TRIMETH/SULFA 160 RESISTANT Resistant     CLINDAMYCIN <=0.25 SENSITIVE Sensitive     RIFAMPIN <=0.5 SENSITIVE Sensitive     Inducible Clindamycin NEGATIVE Sensitive     * 50,000 COLONIES/mL STAPHYLOCOCCUS SPECIES (COAGULASE NEGATIVE)    [x]  Patient discharged originally without antimicrobial agent and treatment is now indicated  New antibiotic prescription: Fluconazole 100mg  PO daily x 14 days  ED Provider: Leary RocaMichael Maczis PA-C  Armandina StammerBATCHELDER,[name] 02/18/2017, 9:23 AM Infectious Diseases Pharmacist Phone# [phone]

## 2017-02-18 NOTE — Telephone Encounter (Signed)
Post ED Visit - Positive Culture Follow-up: Successful Patient Follow-Up  Culture assessed and recommendations reviewed by: [x]  Enzo BiNathan Batchelder, Pharm.D. []  Celedonio MiyamotoJeremy Frens, Pharm.D., BCPS AQ-ID []  Garvin FilaMike Maccia, Pharm.D., BCPS []  Georgina PillionElizabeth Martin, [address]Ph[address] BCPS []  Florence-GrahamMinh Pham, [address]Ph[address]D., BCPS, AAHIVP []  Estella HuskMichelle Turner, Pharm.D., BCPS, AAHIVP []  Lysle Pearlachel Rumbarger, PharmD, BCPS []  Casilda Carlsaylor Stone, PharmD, BCPS []  Pollyann SamplesAndy Johnston, PharmD, BCPS  Positive urine culture  [x]  Patient discharged without antimicrobial prescription and treatment is now indicated []  Organism is resistant to prescribed ED discharge antimicrobial []  Patient with positive blood cultures  Changes discussed with ED provider: Leary RocaMichael Maczis PA New antibiotic prescription start fluconazole 100mg  po daily x 14 days  Attempting to contact patient    Berle MullMiller, David Duke 02/18/2017, 12:37 PM

## 2017-02-21 ENCOUNTER — Emergency Department (HOSPITAL_BASED_OUTPATIENT_CLINIC_OR_DEPARTMENT_OTHER)
Admission: EM | Admit: 2017-02-21 | Discharge: 2017-02-21 | Disposition: A | Discharge status: Home / Self Care | Payer: Medicare Other | Attending: Emergency Medicine | Admitting: Emergency Medicine

## 2017-02-21 ENCOUNTER — Encounter (HOSPITAL_BASED_OUTPATIENT_CLINIC_OR_DEPARTMENT_OTHER): Payer: Self-pay | Admitting: *Deleted

## 2017-02-21 DIAGNOSIS — Z79899 Other long term (current) drug therapy: Secondary | ICD-10-CM | POA: Diagnosis not present

## 2017-02-21 DIAGNOSIS — J449 Chronic obstructive pulmonary disease, unspecified: Secondary | ICD-10-CM | POA: Insufficient documentation

## 2017-02-21 DIAGNOSIS — R339 Retention of urine, unspecified: Secondary | ICD-10-CM | POA: Diagnosis not present

## 2017-02-21 DIAGNOSIS — Z8673 Personal history of transient ischemic attack (TIA), and cerebral infarction without residual deficits: Secondary | ICD-10-CM | POA: Insufficient documentation

## 2017-02-21 DIAGNOSIS — R7303 Prediabetes: Secondary | ICD-10-CM | POA: Insufficient documentation

## 2017-02-21 DIAGNOSIS — R109 Unspecified abdominal pain: Secondary | ICD-10-CM | POA: Insufficient documentation

## 2017-02-21 DIAGNOSIS — Z960 Presence of urogenital implants: Secondary | ICD-10-CM | POA: Diagnosis not present

## 2017-02-21 DIAGNOSIS — F17210 Nicotine dependence, cigarettes, uncomplicated: Secondary | ICD-10-CM | POA: Diagnosis not present

## 2017-02-21 DIAGNOSIS — R300 Dysuria: Secondary | ICD-10-CM | POA: Diagnosis present

## 2017-02-21 LAB — URINALYSIS, MICROSCOPIC (REFLEX)

## 2017-02-21 LAB — URINALYSIS, ROUTINE W REFLEX MICROSCOPIC
BILIRUBIN URINE: NEGATIVE
Glucose, UA: NEGATIVE mg/dL
KETONES UR: NEGATIVE mg/dL
Nitrite: POSITIVE — AB
PH: 6.5 (ref 5.0–8.0)
Protein, ur: NEGATIVE mg/dL
SPECIFIC GRAVITY, URINE: 1.005 (ref 1.005–1.030)

## 2017-02-21 MED ORDER — LORAZEPAM 1 MG PO TABS
0.5000 mg | ORAL_TABLET | Freq: Once | ORAL | Status: AC
  Administered 2017-02-21: 0.5 mg via ORAL
  Filled 2017-02-21: qty 1

## 2017-02-21 NOTE — ED Notes (Signed)
Foley cath irrigated with no difficulty.

## 2017-02-21 NOTE — ED Triage Notes (Signed)
Per [facility] -- pt present with dysuria and abd pain. Pt from home with family. Denies fever, n/v/d. Currently on Bactrim for UTI -- reports issues with recurrent UTIs.

## 2017-02-21 NOTE — ED Notes (Signed)
Pt has indwelling catheter in place.

## 2017-02-21 NOTE — ED Provider Notes (Signed)
[facility]   CSN: [id number] Arrival date & time: 02/21/17  1509     History   Chief Complaint Chief Complaint  Patient presents with  . Dysuria    Level V caveat this patient has dementia, and is poor historian.  HPI David Duke is a 82 y.o. male presenting with extensive history of urinary symptoms.  Patient had a stroke 1 year ago, and while in the hospital, developed urinary retention. He was placed on Foley catheter, and has a catheter since then. Originally he was changing his catheter every 30 days, followed by urologist. 2 months ago, he was put on a new type of catheter, and told not to change it. He has had this current catheter for at least 2 months. He was seen recently in our ER for UTI, treated with initially ciprofloxacin, and then changed to Bactrim. He was also found to have yeast in his urine, and was prescribed Diflucan (he has not picked up his rx yet). Patient reports that early this morning, he was having a lot of suprapubic tenderness, and very little drainage into his Foley bag. He states he has to push down on his bladder to get liquid to go into his Foley bag. At this time he is having lots of pain. Currently he feels his bladder is empty, and he is not having pain. He wonders if there is a problem with his Foley catheter. He denies fever, chills, back pain, or upper abdominal pain. He reports itching in pain intermittently around the head of his penis. This is not improved with the lidocaine cream.  HPI  Past Medical History:  Diagnosis Date  . AKI (acute kidney injury) (HCC)   . Anxiety   . Blind right eye   . BPH (benign prostatic hypertrophy) with urinary retention   . COPD (chronic obstructive pulmonary disease) (HCC)   . Dementia   . Depression   . High cholesterol   . Macular degeneration of both eyes   . Renal disorder   . Shortness of breath dyspnea   . Stroke [facility])    has residual slurred speech    Patient  Active Problem List   Diagnosis Date Noted  . Enterocolitis 09/30/2016  . HLD (hyperlipidemia) 09/30/2016  . Tobacco abuse 09/30/2016  . Nausea vomiting and diarrhea 09/30/2016  . History of stroke in prior 3 months 01/22/2016  . Chronic tension-type headache, not intractable   . Acute frontal sinusitis   . 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 Gait disturbance, post-stroke   . Ataxia, late effect of cerebrovascular disease   . BPH (benign prostatic hyperplasia)   . Urinary retention   . Adjustment disorder with mixed anxiety and depressed mood   . Chronic obstructive pulmonary disease (HCC)   . Cephalalgia   . AKI (acute kidney injury) (HCC)   . Macular degeneration   . Prediabetes   . Hypokalemia   . Absolute anemia   . Aphasia   . Acute encephalopathy 11/25/2015  . Hyponatremia 11/25/2015  . Acute kidney injury (HCC) 11/25/2015  . Depression   . Anxiety   . COPD (chronic obstructive pulmonary disease) (HCC)   . ICH (intracerebral hemorrhage) (HCC) 11/22/2015    Past Surgical History:  Procedure Laterality Date  . NO PAST SURGERIES         Home Medications    Prior to Admission medications   Medication Sig Start Date End Date Taking? Authorizing Provider  albuterol (PROVENTIL HFA;VENTOLIN HFA) 108 (90 Base) MCG/ACT inhaler Inhale 1-2 puffs into the lungs every 6 (six) hours as needed for wheezing or shortness of breath. 12/28/16  Yes Long, Arlyss Repress, MD  atorvastatin (LIPITOR) 20 MG tablet Take 1 tablet (20 mg total) by mouth daily at 6 PM. 09/03/16  Yes Saguier, Ramon Dredge, PA-C  diazepam (VALIUM) 5 MG tablet Take 1 tablet (5 mg total) by mouth every 8 (eight) hours as needed for anxiety. Can fill no sooner than October 24, 2016. 10/20/16  Yes Saguier, Ramon Dredge, PA-C  finasteride (PROSCAR) 5 MG tablet Take 5 mg by mouth daily. 09/22/16  Yes [provider]  fluticasone (FLONASE) 50 MCG/ACT nasal spray Place 1 spray into both nostrils  daily. Patient taking differently: Place 1 spray into both nostrils daily as needed.  12/11/15  Yes Love, [name], PA-C  Multiple Vitamin (MULTIVITAMIN WITH MINERALS) TABS tablet Take 1 tablet by mouth daily.   Yes [provider]  ondansetron (ZOFRAN-ODT) 4 MG disintegrating tablet Take 1 tablet (4 mg total) by mouth every 8 (eight) hours as needed for nausea or vomiting. 10/12/16  Yes Melene Plan, DO  sulfamethoxazole-trimethoprim (BACTRIM DS,SEPTRA DS) 800-160 MG tablet Take 1 tablet by mouth 2 (two) times daily. 02/14/17 02/21/17 Yes Delo, Riley Lam, MD  ciprofloxacin (CIPRO) 500 MG tablet Take 1 tablet (500 mg total) by mouth every 12 (twelve) hours. 02/08/17   Jerelyn Scott, MD    Family History Family History  Problem Relation Age of Onset  . Colon cancer Mother   . Cerebral aneurysm Father     Social History Social History  Substance Use Topics  . Smoking status: Current Every Day Smoker    Packs/day: 0.50    Years: 60.00    Types: Cigarettes  . Smokeless tobacco: Never Used  . Alcohol use No     Allergies   Penicillins   Review of Systems Review of Systems   Physical Exam Updated Vital Signs BP 140/86 (BP Location: Left Arm)   Pulse 94   Temp 98.1 F (36.7 C) (Oral)   Resp 20   Ht 5\' 8"  (1.727 m)   Wt 62.6 kg (138 lb)   SpO2 100%   BMI 20.98 kg/m   Physical Exam   ED Treatments / Results  Labs (all labs ordered are listed, but only abnormal results are displayed) Labs Reviewed  URINALYSIS, ROUTINE W REFLEX MICROSCOPIC - Abnormal; Notable for the following:       Result Value   Hgb urine dipstick TRACE (*)    Nitrite POSITIVE (*)    Leukocytes, UA SMALL (*)    All other components within normal limits  URINALYSIS, MICROSCOPIC (REFLEX) - Abnormal; Notable for the following:    Bacteria, UA FEW (*)    Squamous Epithelial / LPF 0-5 (*)    All other components within normal limits    EKG  EKG Interpretation None       Radiology No results  found.  Procedures Procedures (including critical care time)  Medications Ordered in ED Medications  LORazepam (ATIVAN) tablet 0.5 mg (0.5 mg Oral Given 02/21/17 1808)     Initial Impression / Assessment and Plan / ED Course  I have reviewed the triage vital signs and the nursing notes.  Pertinent labs & imaging results that were available during my care of the patient were reviewed by me and considered in my medical decision making (see chart for details).     Currently the patient's abdominal pain  is improved has his Foley bag was drained several times. He has had this catheter for 2 months, and is concerned there might be something blocking the tube. He denies fever, chills, or back pain. He only has suprapubic pain when he is unable to void through the foley. He states that he feels he needs to push down on his bladder, and when he does so, he is able to urinate into the Foley bag.  Patient's urine positive for leuks and nitrates, but only few bacteria. Pt has had a positive UA at every healthcare visit since March. He is currently on Bactrim for this.  Without symptoms such as fever, chills, dysuria, urinary frequency, hesitant to treat his for UTI at this time. Patient instructed to pick up prescription for Diflucan. Foley catheter was irrigated successfully. Bladder scan showed patient with 190 mL. Patient is currently asymptomatic. I suspect his dementia and BPH are interfering with his use of Foley catheter. Strongly encouraged follow-up with urologist. Patient states that he doesn't feel comfortable with his current urologist, so I provided referral to a different urologist. Strict return precautions given. Patient and his wife agree to plan.  Final Clinical Impressions(s) / ED Diagnoses   Final diagnoses:  Urinary retention    New Prescriptions Discharge Medication List as of 02/21/2017  8:09 PM       Alveria ApleyCaccavale, Arva Slaugh, PA-C 02/21/17 2313    Cardama, Amadeo GarnetPedro Eduardo,  MD 02/22/17 0003

## 2017-02-21 NOTE — ED Notes (Signed)
Pt and family member given d/c instructions as per chart. Verbalizes understanding. No questions.

## 2017-02-21 NOTE — Discharge Instructions (Signed)
Continue taking home medicines for BPH. Finish course of antibiotics. Take Diflucan/fluconazole for yeast infection. Follow-up with urology. This is very important, as they can further evaluate your urinary retention and Foley catheter. Return to the ED if he developed fever, chills, worsening abdominal pain, or decreased ability to urinate.

## 2017-02-21 NOTE — ED Notes (Signed)
ED Provider at bedside. 

## 2017-02-21 NOTE — ED Notes (Signed)
Per MD -- will obtain U/A when catheter is changed.

## 2017-02-25 ENCOUNTER — Emergency Department (HOSPITAL_BASED_OUTPATIENT_CLINIC_OR_DEPARTMENT_OTHER)
Admission: EM | Admit: 2017-02-25 | Discharge: 2017-02-25 | Disposition: A | Discharge status: Home / Self Care | Payer: Medicare Other | Attending: Emergency Medicine | Admitting: Emergency Medicine

## 2017-02-25 ENCOUNTER — Encounter (HOSPITAL_BASED_OUTPATIENT_CLINIC_OR_DEPARTMENT_OTHER): Payer: Self-pay | Admitting: Emergency Medicine

## 2017-02-25 DIAGNOSIS — Y9389 Activity, other specified: Secondary | ICD-10-CM | POA: Diagnosis not present

## 2017-02-25 DIAGNOSIS — T8384XA Pain from genitourinary prosthetic devices, implants and grafts, initial encounter: Secondary | ICD-10-CM | POA: Insufficient documentation

## 2017-02-25 DIAGNOSIS — J449 Chronic obstructive pulmonary disease, unspecified: Secondary | ICD-10-CM | POA: Diagnosis not present

## 2017-02-25 DIAGNOSIS — Y929 Unspecified place or not applicable: Secondary | ICD-10-CM | POA: Insufficient documentation

## 2017-02-25 DIAGNOSIS — Y733 Surgical instruments, materials and gastroenterology and urology devices (including sutures) associated with adverse incidents: Secondary | ICD-10-CM | POA: Insufficient documentation

## 2017-02-25 DIAGNOSIS — Z960 Presence of urogenital implants: Secondary | ICD-10-CM

## 2017-02-25 DIAGNOSIS — F17210 Nicotine dependence, cigarettes, uncomplicated: Secondary | ICD-10-CM | POA: Insufficient documentation

## 2017-02-25 DIAGNOSIS — Z9289 Personal history of other medical treatment: Secondary | ICD-10-CM | POA: Insufficient documentation

## 2017-02-25 DIAGNOSIS — Z79899 Other long term (current) drug therapy: Secondary | ICD-10-CM | POA: Diagnosis not present

## 2017-02-25 DIAGNOSIS — Z978 Presence of other specified devices: Secondary | ICD-10-CM

## 2017-02-25 DIAGNOSIS — Y999 Unspecified external cause status: Secondary | ICD-10-CM | POA: Diagnosis not present

## 2017-02-25 NOTE — ED Notes (Signed)
ED Provider at bedside. Dr. Schlossman 

## 2017-02-25 NOTE — ED Triage Notes (Signed)
Patient has catheter in and states that it is pulling too much  - the clamp and strap are not working correctly

## 2017-02-25 NOTE — ED Provider Notes (Signed)
[facility] Provider Note   CSN: [id number] Arrival date & time: 02/25/17  1830  By signing my name below, I, [name], attest that this documentation has been prepared under the direction and in the presence of Alvira MondaySchlossman, Dashonna Chagnon, MD. Electronically Signed: [name], ED Scribe. 02/25/17. 6:55 PM.  History   Chief Complaint No chief complaint on file.  The history is provided by the patient. No language interpreter was used.   HPI Comments: David Duke is a 82 y.o. male with a PMHx of renal disorder, who presents to the Emergency Department complaining of mild pain due to discomfort from his catheter onset today. Pt states the catheter is working correctly. Pt describes tugging of cathter, fear that the catheter will be pulled out.  Pt was seen in the ED 4 days ago complaining of dysuria due to an infection. Pt denies any leaking from the catheter. Pt denies abdominal pain, fever or any other complaints at this time.  Past Medical History:  Diagnosis Date  . AKI (acute kidney injury) (HCC)   . Anxiety   . Blind right eye   . BPH (benign prostatic hypertrophy) with urinary retention   . COPD (chronic obstructive pulmonary disease) (HCC)   . Dementia   . Depression   . High cholesterol   . Macular degeneration of both eyes   . Renal disorder   . Shortness of breath dyspnea   . Stroke [facility](HCC)    has residual slurred speech    Patient Active Problem List   Diagnosis Date Noted  . Enterocolitis 09/30/2016  . HLD (hyperlipidemia) 09/30/2016  . Tobacco abuse 09/30/2016  . Nausea vomiting and diarrhea 09/30/2016  . History of stroke in prior 3 months 01/22/2016  . Chronic tension-type headache, not intractable   . Acute frontal sinusitis   . 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 Gait disturbance, post-stroke   . Ataxia, late effect of cerebrovascular disease   . BPH (benign prostatic hyperplasia)   . Urinary retention   .  Adjustment disorder with mixed anxiety and depressed mood   . Chronic obstructive pulmonary disease (HCC)   . Cephalalgia   . AKI (acute kidney injury) (HCC)   . Macular degeneration   . Prediabetes   . Hypokalemia   . Absolute anemia   . Aphasia   . Acute encephalopathy 11/25/2015  . Hyponatremia 11/25/2015  . Acute kidney injury (HCC) 11/25/2015  . Depression   . Anxiety   . COPD (chronic obstructive pulmonary disease) (HCC)   . ICH (intracerebral hemorrhage) (HCC) 11/22/2015    Past Surgical History:  Procedure Laterality Date  . NO PAST SURGERIES         Home Medications    Prior to Admission medications   Medication Sig Start Date End Date Taking? Authorizing Provider  albuterol (PROVENTIL HFA;VENTOLIN HFA) 108 (90 Base) MCG/ACT inhaler Inhale 1-2 puffs into the lungs every 6 (six) hours as needed for wheezing or shortness of breath. 12/28/16   Long, Arlyss RepressJoshua G, MD  atorvastatin (LIPITOR) 20 MG tablet Take 1 tablet (20 mg total) by mouth daily at 6 PM. 09/03/16   Saguier, Ramon DredgeEdward, PA-C  ciprofloxacin (CIPRO) 500 MG tablet Take 1 tablet (500 mg total) by mouth every 12 (twelve) hours. 02/08/17   Jerelyn ScottLinker, Martha, MD  diazepam (VALIUM) 5 MG tablet Take 1 tablet (5 mg total) by mouth every 8 (eight) hours as needed for anxiety. Can fill no sooner than October 24, 2016. 10/20/16   Saguier, Ramon Dredge, PA-C  finasteride (PROSCAR) 5 MG tablet Take 5 mg by mouth daily. 09/22/16   [provider]  fluticasone (FLONASE) 50 MCG/ACT nasal spray Place 1 spray into both nostrils daily. Patient taking differently: Place 1 spray into both nostrils daily as needed.  12/11/15   Love, [name], PA-C  Multiple Vitamin (MULTIVITAMIN WITH MINERALS) TABS tablet Take 1 tablet by mouth daily.    [provider]  ondansetron (ZOFRAN-ODT) 4 MG disintegrating tablet Take 1 tablet (4 mg total) by mouth every 8 (eight) hours as needed for nausea or vomiting. 10/12/16   Melene Plan, DO    Family  History Family History  Problem Relation Age of Onset  . Colon cancer Mother   . Cerebral aneurysm Father     Social History Social History  Substance Use Topics  . Smoking status: Current Every Day Smoker    Packs/day: 0.50    Years: 60.00    Types: Cigarettes  . Smokeless tobacco: Never Used  . Alcohol use No     Allergies   Penicillins   Review of Systems Review of Systems  Constitutional: Negative for fever.  Gastrointestinal: Negative for abdominal pain.  Genitourinary: Negative for decreased urine volume, dysuria, hematuria and penile pain.     Physical Exam Updated Vital Signs BP (!) 156/4 (BP Location: Right Arm)   Pulse 93   Temp 98.3 F (36.8 C) (Oral)   Resp 18   SpO2 100%   Physical Exam  Constitutional: He appears well-developed and well-nourished. No distress.  HENT:  Head: Normocephalic and atraumatic.  Eyes: Conjunctivae and EOM are normal.  Neck: Normal range of motion.  Cardiovascular: Normal rate, regular rhythm and intact distal pulses.   Pulmonary/Chest: Effort normal. No respiratory distress.  Abdominal: Soft. He exhibits no distension. There is no tenderness. There is no guarding.  Genitourinary: Penis normal.  Musculoskeletal: He exhibits no edema.  Neurological: He is alert.  Skin: Skin is warm and dry. He is not diaphoretic.  Nursing note and vitals reviewed.    ED Treatments / Results  DIAGNOSTIC STUDIES: Oxygen Saturation is 100% on RA, normal by my interpretation.   COORDINATION OF CARE: 6:54 PM-Discussed next steps with pt. Pt verbalized understanding and is agreeable with the plan.   Labs (all labs ordered are listed, but only abnormal results are displayed) Labs Reviewed - No data to display  EKG  EKG Interpretation None       Radiology No results found.  Procedures Procedures (including critical care time)  Medications Ordered in ED Medications - No data to display   Initial Impression / Assessment  and Plan / ED Course  I have reviewed the triage vital signs and the nursing notes.  Pertinent labs & imaging results that were available during my care of the patient were reviewed by me and considered in my medical decision making (see chart for details).     82yo male presents with concern for movement of foley catheter within catheter holder, concern for risk of catheter being pulled out.  Patient with foley tubing through holder however not at y portion causing movement.  Catheter holder adjusted by nursing and catheter secure in place. Pt without other concerns today. Patient discharged in stable condition with understanding of reasons to return.   Final Clinical Impressions(s) / ED Diagnoses   Final diagnoses:  Foley catheter in place    New Prescriptions Discharge Medication List as of 02/25/2017  6:55 PM  I personally performed the services described in this documentation, which was scribed in my presence. The recorded information has been reviewed and is accurate.     Alvira Monday, MD 02/26/17 1301

## 2017-02-25 NOTE — ED Notes (Signed)
Pt c/o catheter pulling. Upon exam noted that the catheter was not lined up properly in the catheter stabilization device and was sliding freely and had pulled down his leg. Showed proper use to pt and wife who verbalzied understanding and demonstrated correct use

## 2017-03-14 ENCOUNTER — Emergency Department (HOSPITAL_BASED_OUTPATIENT_CLINIC_OR_DEPARTMENT_OTHER)
Admission: EM | Admit: 2017-03-14 | Discharge: 2017-03-14 | Disposition: A | Discharge status: Home / Self Care | Payer: Medicare Other | Attending: Emergency Medicine | Admitting: Emergency Medicine

## 2017-03-14 ENCOUNTER — Encounter (HOSPITAL_BASED_OUTPATIENT_CLINIC_OR_DEPARTMENT_OTHER): Payer: Self-pay | Admitting: Emergency Medicine

## 2017-03-14 DIAGNOSIS — Z79899 Other long term (current) drug therapy: Secondary | ICD-10-CM | POA: Diagnosis not present

## 2017-03-14 DIAGNOSIS — R600 Localized edema: Secondary | ICD-10-CM | POA: Insufficient documentation

## 2017-03-14 DIAGNOSIS — Z8673 Personal history of transient ischemic attack (TIA), and cerebral infarction without residual deficits: Secondary | ICD-10-CM | POA: Diagnosis not present

## 2017-03-14 DIAGNOSIS — J449 Chronic obstructive pulmonary disease, unspecified: Secondary | ICD-10-CM | POA: Diagnosis not present

## 2017-03-14 DIAGNOSIS — F17210 Nicotine dependence, cigarettes, uncomplicated: Secondary | ICD-10-CM | POA: Insufficient documentation

## 2017-03-14 LAB — CBC WITH DIFFERENTIAL/PLATELET
BASOS PCT: 1 %
Basophils Absolute: 0 10*3/uL (ref 0.0–0.1)
EOS ABS: 0.3 10*3/uL (ref 0.0–0.7)
EOS PCT: 4 %
HCT: 42.5 % (ref 39.0–52.0)
HEMOGLOBIN: 14.3 g/dL (ref 13.0–17.0)
Lymphocytes Relative: 19 %
Lymphs Abs: 1.7 10*3/uL (ref 0.7–4.0)
MCH: 31.2 pg (ref 26.0–34.0)
MCHC: 33.6 g/dL (ref 30.0–36.0)
MCV: 92.6 fL (ref 78.0–100.0)
MONO ABS: 0.6 10*3/uL (ref 0.1–1.0)
MONOS PCT: 7 %
NEUTROS PCT: 69 %
Neutro Abs: 6.2 10*3/uL (ref 1.7–7.7)
Platelets: 243 10*3/uL (ref 150–400)
RBC: 4.59 MIL/uL (ref 4.22–5.81)
RDW: 13.3 % (ref 11.5–15.5)
WBC: 8.9 10*3/uL (ref 4.0–10.5)

## 2017-03-14 LAB — COMPREHENSIVE METABOLIC PANEL
ALBUMIN: 3.7 g/dL (ref 3.5–5.0)
ALK PHOS: 83 U/L (ref 38–126)
ALT: 23 U/L (ref 17–63)
AST: 26 U/L (ref 15–41)
Anion gap: 10 (ref 5–15)
BUN: 12 mg/dL (ref 6–20)
CALCIUM: 9 mg/dL (ref 8.9–10.3)
CHLORIDE: 101 mmol/L (ref 101–111)
CO2: 29 mmol/L (ref 22–32)
CREATININE: 1.5 mg/dL — AB (ref 0.61–1.24)
GFR calc non Af Amer: 42 mL/min — ABNORMAL LOW (ref >60)
GFR, EST AFRICAN AMERICAN: 49 mL/min — AB (ref >60)
GLUCOSE: 96 mg/dL (ref 65–99)
Potassium: 3.7 mmol/L (ref 3.5–5.1)
SODIUM: 140 mmol/L (ref 135–145)
Total Bilirubin: 0.4 mg/dL (ref 0.3–1.2)
Total Protein: 7.3 g/dL (ref 6.5–8.1)

## 2017-03-14 MED ORDER — FUROSEMIDE 20 MG PO TABS: 20.0000 mg | ORAL_TABLET | Freq: Every day | ORAL | 0 refills | Status: DC

## 2017-03-14 NOTE — ED Triage Notes (Signed)
Bilateral leg swelling x1 week.

## 2017-03-14 NOTE — ED Provider Notes (Signed)
[facility]   CSN: [id number] Arrival date & time: 03/14/17  1444  By signing my name below, I, David Duke, attest that this documentation has been prepared under the direction and in the presence of No att. providers found. Electronically Signed: Thelma BargeNick Duke, Scribe. 03/14/17. 4:07 PM.  History   Chief Complaint Chief Complaint  Patient presents with  . Leg Swelling   The history is provided by the patient and the spouse. No language interpreter was used.    HPI Comments: David Duke is a 82 y.o. male with PMHx of CVA, dementia, and renal disorder who presents to the Emergency Department complaining of constant, gradually worsening bilateral leg swelling that began 1.5 weeks ago. He has associated pain to the area. Seemed to get worse today. He reports he has been sleeping in a recliner with one of his legs down to prevent the urine in his foley catheter from going back up. He denies CP, SOB, fevers, and abdominal swelling. Pt had a recent UTI 2 weeks ago and completed Abx for this. Pt also had a recent yeast infection and finished his antifungal medications 5 days ago. Pt denies PMHx of HTN or prednisone use. Pt has a foley catheter placed.  Past Medical History:  Diagnosis Date  . AKI (acute kidney injury) (HCC)   . Anxiety   . Blind right eye   . BPH (benign prostatic hypertrophy) with urinary retention   . COPD (chronic obstructive pulmonary disease) (HCC)   . Dementia   . Depression   . High cholesterol   . Macular degeneration of both eyes   . Renal disorder   . Shortness of breath dyspnea   . Stroke [facility](HCC)    has residual slurred speech    Patient Active Problem List   Diagnosis Date Noted  . Enterocolitis 09/30/2016  . HLD (hyperlipidemia) 09/30/2016  . Tobacco abuse 09/30/2016  . Nausea vomiting and diarrhea 09/30/2016  . History of stroke in prior 3 months 01/22/2016  . Chronic tension-type headache, not intractable   . Acute  frontal sinusitis   . 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 Gait disturbance, post-stroke   . Ataxia, late effect of cerebrovascular disease   . BPH (benign prostatic hyperplasia)   . Urinary retention   . Adjustment disorder with mixed anxiety and depressed mood   . Chronic obstructive pulmonary disease (HCC)   . Cephalalgia   . AKI (acute kidney injury) (HCC)   . Macular degeneration   . Prediabetes   . Hypokalemia   . Absolute anemia   . Aphasia   . Acute encephalopathy 11/25/2015  . Hyponatremia 11/25/2015  . Acute kidney injury (HCC) 11/25/2015  . Depression   . Anxiety   . COPD (chronic obstructive pulmonary disease) (HCC)   . ICH (intracerebral hemorrhage) (HCC) 11/22/2015    Past Surgical History:  Procedure Laterality Date  . NO PAST SURGERIES         Home Medications    Prior to Admission medications   Medication Sig Start Date End Date Taking? Authorizing Provider  albuterol (PROVENTIL HFA;VENTOLIN HFA) 108 (90 Base) MCG/ACT inhaler Inhale 1-2 puffs into the lungs every 6 (six) hours as needed for wheezing or shortness of breath. 12/28/16   Long, Arlyss RepressJoshua G, MD  atorvastatin (LIPITOR) 20 MG tablet Take 1 tablet (20 mg total) by mouth daily at 6 PM. 09/03/16   Saguier, Ramon DredgeEdward, PA-C  ciprofloxacin (CIPRO) 500 MG tablet Take  1 tablet (500 mg total) by mouth every 12 (twelve) hours. 02/08/17   Jerelyn Jackye Dever, MD  diazepam (VALIUM) 5 MG tablet Take 1 tablet (5 mg total) by mouth every 8 (eight) hours as needed for anxiety. Can fill no sooner than October 24, 2016. 10/20/16   Saguier, Ramon Dredge, PA-C  finasteride (PROSCAR) 5 MG tablet Take 5 mg by mouth daily. 09/22/16   [provider]  fluticasone (FLONASE) 50 MCG/ACT nasal spray Place 1 spray into both nostrils daily. Patient taking differently: Place 1 spray into both nostrils daily as needed.  12/11/15   Love, [name], PA-C  furosemide (LASIX) 20 MG tablet Take 1 tablet (20 mg total) by  mouth daily. 03/14/17   Pricilla Loveless, MD  Multiple Vitamin (MULTIVITAMIN WITH MINERALS) TABS tablet Take 1 tablet by mouth daily.    [provider]  ondansetron (ZOFRAN-ODT) 4 MG disintegrating tablet Take 1 tablet (4 mg total) by mouth every 8 (eight) hours as needed for nausea or vomiting. 10/12/16   Melene Plan, DO    Family History Family History  Problem Relation Age of Onset  . Colon cancer Mother   . Cerebral aneurysm Father     Social History Social History  Substance Use Topics  . Smoking status: Current Every Day Smoker    Packs/day: 0.50    Years: 60.00    Types: Cigarettes  . Smokeless tobacco: Never Used  . Alcohol use No     Allergies   Penicillins   Review of Systems Review of Systems  Constitutional: Negative for fever.  Respiratory: Negative for shortness of breath.   Cardiovascular: Positive for leg swelling. Negative for chest pain.  Gastrointestinal: Negative for abdominal distention.  All other systems reviewed and are negative.    Physical Exam Updated Vital Signs BP 123/73 (BP Location: Left Arm)   Pulse 78   Temp 98.3 F (36.8 C) (Oral)   Resp 20   SpO2 97%   Physical Exam  Constitutional: He is oriented to person, place, and time. He appears well-developed and well-nourished.  HENT:  Head: Normocephalic and atraumatic.  Right Ear: External ear normal.  Left Ear: External ear normal.  Nose: Nose normal.  Eyes: Right eye exhibits no discharge. Left eye exhibits no discharge.  Neck: Neck supple.  Cardiovascular: Normal rate, regular rhythm and normal heart sounds.   2+ dp pulses bilaterally  Pulmonary/Chest: Effort normal and breath sounds normal. He has no rales.  Abdominal: Soft. He exhibits no distension. There is no tenderness.  Musculoskeletal: He exhibits edema.  Bilateral pitting edema from the feet to the proximal lower legs Slight pink skin changes without warmth or streaking  Neurological: He is alert and oriented  to person, place, and time.  Skin: Skin is warm and dry.  Nursing note and vitals reviewed.    ED Treatments / Results  DIAGNOSTIC STUDIES: Oxygen Saturation is 100% on RA, normal by my interpretation.    COORDINATION OF CARE: 4:04 PM Discussed treatment plan with pt at bedside and pt agreed to plan.  Labs (all labs ordered are listed, but only abnormal results are displayed) Labs Reviewed  COMPREHENSIVE METABOLIC PANEL - Abnormal; Notable for the following:       Result Value   Creatinine, Ser 1.50 (*)    GFR calc non Af Amer 42 (*)    GFR calc Af Amer 49 (*)    All other components within normal limits  CBC WITH DIFFERENTIAL/PLATELET    EKG  EKG  Interpretation None       Radiology No results found.  Procedures Procedures (including critical care time)  Medications Ordered in ED Medications - No data to display   Initial Impression / Assessment and Plan / ED Course  I have reviewed the triage vital signs and the nursing notes.  Pertinent labs & imaging results that were available during my care of the patient were reviewed by me and considered in my medical decision making (see chart for details).     Patient's renal function is at baseline. No clear etiology for the acute bilateral, symmetric edema. It is possible this is very mild CHF but he has no chest symptoms, JVD, and has clear lungs. I do not think x-ray or BNP would be helpful today. I will put him on low-dose Lasix, advised him to raise his legs when able, and put on compression stockings which she has at home. Otherwise, I think he is to follow-up with his PCP for further workup and care. He appears stable for discharge home. Discussed strict return precautions. No signs of cellulitis or infectious cause.  Final Clinical Impressions(s) / ED Diagnoses   Final diagnoses:  Bilateral lower extremity edema    New Prescriptions Discharge Medication List as of 03/14/2017  5:49 PM    START taking these  medications   Details  furosemide (LASIX) 20 MG tablet Take 1 tablet (20 mg total) by mouth daily., Starting Sat 03/14/2017, Print       I personally performed the services described in this documentation, which was scribed in my presence. The recorded information has been reviewed and is accurate.     Pricilla Loveless, MD 03/14/17 [phone]

## 2017-03-20 ENCOUNTER — Emergency Department (HOSPITAL_BASED_OUTPATIENT_CLINIC_OR_DEPARTMENT_OTHER)
Admission: EM | Admit: 2017-03-20 | Discharge: 2017-03-20 | Disposition: A | Discharge status: Home / Self Care | Payer: Medicare Other | Attending: Emergency Medicine | Admitting: Emergency Medicine

## 2017-03-20 ENCOUNTER — Emergency Department (HOSPITAL_BASED_OUTPATIENT_CLINIC_OR_DEPARTMENT_OTHER): Payer: Medicare Other

## 2017-03-20 ENCOUNTER — Encounter (HOSPITAL_BASED_OUTPATIENT_CLINIC_OR_DEPARTMENT_OTHER): Payer: Self-pay

## 2017-03-20 DIAGNOSIS — R110 Nausea: Secondary | ICD-10-CM | POA: Diagnosis present

## 2017-03-20 DIAGNOSIS — R338 Other retention of urine: Secondary | ICD-10-CM

## 2017-03-20 DIAGNOSIS — R339 Retention of urine, unspecified: Secondary | ICD-10-CM | POA: Diagnosis not present

## 2017-03-20 DIAGNOSIS — Z79899 Other long term (current) drug therapy: Secondary | ICD-10-CM | POA: Insufficient documentation

## 2017-03-20 DIAGNOSIS — J449 Chronic obstructive pulmonary disease, unspecified: Secondary | ICD-10-CM | POA: Insufficient documentation

## 2017-03-20 DIAGNOSIS — R109 Unspecified abdominal pain: Secondary | ICD-10-CM

## 2017-03-20 DIAGNOSIS — F0390 Unspecified dementia without behavioral disturbance: Secondary | ICD-10-CM | POA: Insufficient documentation

## 2017-03-20 DIAGNOSIS — F17210 Nicotine dependence, cigarettes, uncomplicated: Secondary | ICD-10-CM | POA: Insufficient documentation

## 2017-03-20 DIAGNOSIS — T839XXA Unspecified complication of genitourinary prosthetic device, implant and graft, initial encounter: Secondary | ICD-10-CM

## 2017-03-20 LAB — URINALYSIS, MICROSCOPIC (REFLEX)

## 2017-03-20 LAB — CBC
HEMATOCRIT: 39.5 % (ref 39.0–52.0)
Hemoglobin: 13.2 g/dL (ref 13.0–17.0)
MCH: 30.9 pg (ref 26.0–34.0)
MCHC: 33.4 g/dL (ref 30.0–36.0)
MCV: 92.5 fL (ref 78.0–100.0)
PLATELETS: 216 10*3/uL (ref 150–400)
RBC: 4.27 MIL/uL (ref 4.22–5.81)
RDW: 13 % (ref 11.5–15.5)
WBC: 9.3 10*3/uL (ref 4.0–10.5)

## 2017-03-20 LAB — BASIC METABOLIC PANEL
ANION GAP: 10 (ref 5–15)
BUN: 13 mg/dL (ref 6–20)
CO2: 29 mmol/L (ref 22–32)
Calcium: 8.6 mg/dL — ABNORMAL LOW (ref 8.9–10.3)
Chloride: 101 mmol/L (ref 101–111)
Creatinine, Ser: 1.57 mg/dL — ABNORMAL HIGH (ref 0.61–1.24)
GFR, EST AFRICAN AMERICAN: 46 mL/min — AB (ref >60)
GFR, EST NON AFRICAN AMERICAN: 40 mL/min — AB (ref >60)
GLUCOSE: 99 mg/dL (ref 65–99)
POTASSIUM: 3.9 mmol/L (ref 3.5–5.1)
Sodium: 140 mmol/L (ref 135–145)

## 2017-03-20 LAB — URINALYSIS, ROUTINE W REFLEX MICROSCOPIC
Bilirubin Urine: NEGATIVE
Glucose, UA: NEGATIVE mg/dL
Ketones, ur: NEGATIVE mg/dL
NITRITE: POSITIVE — AB
Protein, ur: NEGATIVE mg/dL
SPECIFIC GRAVITY, URINE: 1.006 (ref 1.005–1.030)
pH: 6.5 (ref 5.0–8.0)

## 2017-03-20 MED ORDER — ALBUTEROL SULFATE HFA 108 (90 BASE) MCG/ACT IN AERS
2.0000 | INHALATION_SPRAY | Freq: Once | RESPIRATORY_TRACT | Status: AC
  Administered 2017-03-20: 2 via RESPIRATORY_TRACT
  Filled 2017-03-20: qty 6.7

## 2017-03-20 MED ORDER — LIDOCAINE HCL 2 % EX GEL
1.0000 "application " | Freq: Once | CUTANEOUS | Status: AC
  Administered 2017-03-20: 1 via URETHRAL

## 2017-03-20 MED ORDER — LIDOCAINE HCL 2 % EX GEL
CUTANEOUS | Status: AC
  Filled 2017-03-20: qty 20

## 2017-03-20 NOTE — Discharge Instructions (Signed)
Please call your [facility]rolog[facility] for follow-up

## 2017-03-20 NOTE — ED Triage Notes (Signed)
Pt reports dizziness, nausea, abd pain, back pain. Pt has chronic foley catheter and he feels like it is too short.

## 2017-03-20 NOTE — ED Provider Notes (Signed)
[facility] Provider Note   CSN: [id number] Arrival date & time: 03/20/17  0308     History   Chief Complaint Chief Complaint  Patient presents with  . Nausea   Level V caveat: Dementia  HPI [place] is a 82 y.o. male.  HPI Patient is brought to the emergency department by his spouse after reporting abdominal pain and nausea tonight.  The patient does have a history of dementia and therefore cannot provide a reliable history.  He does have a chronic indwelling catheter.  He is currently following with urology in [place].  No recent diarrhea or constipation.  There is no report of vomiting or high fever.  No upper respiratory symptoms.  Eating and drinking normally over the past several days per wife   Past Medical History:  Diagnosis Date  . AKI (acute kidney injury) (HCC)   . Anxiety   . Blind right eye   . BPH (benign prostatic hypertrophy) with urinary retention   . COPD (chronic obstructive pulmonary disease) (HCC)   . Dementia   . Depression   . High cholesterol   . Macular degeneration of both eyes   . Renal disorder   . Shortness of breath dyspnea   . Stroke [facility])    has residual slurred speech    Patient Active Problem List   Diagnosis Date Noted  . Enterocolitis 09/30/2016  . HLD (hyperlipidemia) 09/30/2016  . Tobacco abuse 09/30/2016  . Nausea vomiting and diarrhea 09/30/2016  . History of stroke in prior 3 months 01/22/2016  . Chronic tension-type headache, not intractable   . Acute frontal sinusitis   . 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 Gait disturbance, post-stroke   . Ataxia, late effect of cerebrovascular disease   . BPH (benign prostatic hyperplasia)   . Urinary retention   . Adjustment disorder with mixed anxiety and depressed mood   . Chronic obstructive pulmonary disease (HCC)   . Cephalalgia   . AKI (acute kidney injury) (HCC)   . Macular degeneration   . Prediabetes   . Hypokalemia    . Absolute anemia   . Aphasia   . Acute encephalopathy 11/25/2015  . Hyponatremia 11/25/2015  . Acute kidney injury (HCC) 11/25/2015  . Depression   . Anxiety   . COPD (chronic obstructive pulmonary disease) (HCC)   . ICH (intracerebral hemorrhage) (HCC) 11/22/2015    Past Surgical History:  Procedure Laterality Date  . NO PAST SURGERIES         Home Medications    Prior to Admission medications   Medication Sig Start Date End Date Taking? Authorizing Provider  albuterol (PROVENTIL HFA;VENTOLIN HFA) 108 (90 Base) MCG/ACT inhaler Inhale 1-2 puffs into the lungs every 6 (six) hours as needed for wheezing or shortness of breath. 12/28/16  Yes Long, Arlyss Repress, MD  atorvastatin (LIPITOR) 20 MG tablet Take 1 tablet (20 mg total) by mouth daily at 6 PM. 09/03/16  Yes Saguier, Ramon Dredge, PA-C  ciprofloxacin (CIPRO) 500 MG tablet Take 1 tablet (500 mg total) by mouth every 12 (twelve) hours. 02/08/17  Yes Jerelyn Scott, MD  diazepam (VALIUM) 5 MG tablet Take 1 tablet (5 mg total) by mouth every 8 (eight) hours as needed for anxiety. Can fill no sooner than October 24, 2016. 10/20/16  Yes Saguier, Ramon Dredge, PA-C  finasteride (PROSCAR) 5 MG tablet Take 5 mg by mouth daily. 09/22/16  Yes [provider]  fluticasone (FLONASE) 50 MCG/ACT nasal  spray Place 1 spray into both nostrils daily. Patient taking differently: Place 1 spray into both nostrils daily as needed.  12/11/15  Yes Love, Evlyn KannerPamela S, PA-C  Multiple Vitamin (MULTIVITAMIN WITH MINERALS) TABS tablet Take 1 tablet by mouth daily.   Yes [provider]  ondansetron (ZOFRAN-ODT) 4 MG disintegrating tablet Take 1 tablet (4 mg total) by mouth every 8 (eight) hours as needed for nausea or vomiting. 10/12/16  Yes Melene PlanFloyd, Dan, DO  furosemide (LASIX) 20 MG tablet Take 1 tablet (20 mg total) by mouth daily. 03/14/17   Pricilla LovelessGoldston, Scott, MD    Family History Family History  Problem Relation Age of Onset  . Colon cancer Mother   . Cerebral  aneurysm Father     Social History Social History  Substance Use Topics  . Smoking status: Current Every Day Smoker    Packs/day: 0.50    Years: 60.00    Types: Cigarettes  . Smokeless tobacco: Never Used  . Alcohol use No     Allergies   Penicillins   Review of Systems Review of Systems  Unable to perform ROS: Dementia     Physical Exam Updated Vital Signs BP 113/60 (BP Location: Left Arm)   Pulse 74   Temp 98.1 F (36.7 C) (Oral)   Resp 18   Ht 5\' 8"  (1.727 m)   Wt 59 kg (130 lb)   SpO2 97%   BMI 19.77 kg/m   Physical Exam  Constitutional: He appears well-developed and well-nourished.  HENT:  Head: Normocephalic and atraumatic.  Eyes: EOM are normal.  Neck: Normal range of motion.  Cardiovascular: Normal rate and regular rhythm.   Pulmonary/Chest: Effort normal and breath sounds normal. No respiratory distress.  Abdominal: Soft. He exhibits no distension. There is no tenderness.  Genitourinary:  Genitourinary Comments: Foley catheter in place  Musculoskeletal: Normal range of motion.  Neurological: He is alert.  Skin: Skin is warm and dry.  Psychiatric: He has a normal mood and affect. Judgment normal.  Nursing note and vitals reviewed.    ED Treatments / Results  Labs (all labs ordered are listed, but only abnormal results are displayed) Labs Reviewed  URINALYSIS, ROUTINE W REFLEX MICROSCOPIC - Abnormal; Notable for the following:       Result Value   APPearance CLOUDY (*)    Hgb urine dipstick MODERATE (*)    Nitrite POSITIVE (*)    Leukocytes, UA LARGE (*)    All other components within normal limits  BASIC METABOLIC PANEL - Abnormal; Notable for the following:    Creatinine, Ser 1.57 (*)    Calcium 8.6 (*)    GFR calc non Af Amer 40 (*)    GFR calc Af Amer 46 (*)    All other components within normal limits  URINALYSIS, MICROSCOPIC (REFLEX) - Abnormal; Notable for the following:    Bacteria, UA MANY (*)    Squamous Epithelial / LPF 0-5  (*)    All other components within normal limits  URINE CULTURE  CBC    EKG  EKG Interpretation None       Radiology Ct Renal Stone Study  Result Date: 03/20/2017 CLINICAL DATA:  82 year old male with left flank pain. Chronic indwelling catheter. EXAM: CT ABDOMEN AND PELVIS WITHOUT CONTRAST TECHNIQUE: Multidetector CT imaging of the abdomen and pelvis was performed following the standard protocol without IV contrast. COMPARISON:  CT dated 10/12/2016 FINDINGS: Evaluation of this exam is limited in the absence of intravenous contrast. Lower chest: Bibasilar  linear scarring. The visualized lung bases are otherwise clear. No intra-abdominal free air or free fluid. Hepatobiliary: A 1 cm right hepatic hypodense lesion is not well characterized but appears similar to prior CT and likely represents a cyst or hemangioma. No intrahepatic biliary ductal dilatation. The gallbladder is unremarkable. Pancreas: Unremarkable. No pancreatic ductal dilatation or surrounding inflammatory changes. Spleen: Normal in size without focal abnormality. Adrenals/Urinary Tract: The adrenal glands are unremarkable. There is a 2.2 cm high attenuating left renal lesion which is not characterized on this noncontrast CT but appears similar to prior CT and may represent a complex/hemorrhagic cyst. A solid lesion is not excluded. Further evaluation with ultrasound or MRI recommended. There is no hydronephrosis or nephrolithiasis on either side. The visualized ureters appear unremarkable. The urinary bladder is mildly distended despite presence of a Foley catheter. There is a focal area of thickening along the posterior bladder wall abutting the distal tip of the Foley catheter (best seen on the sagittal series 5, image 50) and may represent scarring. Underlying infiltrative process is not excluded. Correlation with clinical exam, urinalysis recommended. Cystoscopy may provide better evaluation. Stomach/Bowel: There is extensive  sigmoid diverticulosis without active inflammatory changes. Moderate amount of stool noted throughout the colon. There is no evidence of bowel obstruction or active inflammation. Normal appendix. Vascular/Lymphatic: There is moderate aortoiliac atherosclerotic disease. Fusiform 4.1 cm infrarenal abdominal aortic aneurysm similar to the prior CT. Evaluation of the vasculature is limited in the absence of intravenous contrast. There is no periaortic stranding or fluid. No portal venous gas identified. There is no adenopathy. Reproductive: The prostate gland is mildly enlarged with median lobe hypertrophy indenting the base of the bladder. Other: None Musculoskeletal: Degenerative changes of the spine. No acute fracture. IMPRESSION: 1. No hydronephrosis or nephrolithiasis. High attenuating left adrenal lesion, incompletely characterized but may represent a hemorrhagic cyst and causing patient's symptoms. A solid lesion is not excluded. Further characterisation with ultrasound or MRI is recommended. 2. Sigmoid diverticulosis without active inflammatory changes. 3. Moderate colonic stool burden. No bowel obstruction or active inflammation. Normal appendix. 4. A 4.1 cm infrarenal abdominal aortic aneurysm as seen on the prior CT. Follow-up as previously recommended. 5.  Aortic Atherosclerosis (ICD10-I70.0). 6. Distended urinary bladder despite presence of a Foley. Focal area of thickening of the posterior bladder wall abutting the distal tip of the Foley catheter may represent scarring and adhesion. An infiltrative or neoplastic process is not excluded. Further evaluation with cystoscopy is recommended. Electronically Signed   By: Elgie Collard M.D.   On: 03/20/2017 05:09    Procedures Procedures (including critical care time)  Medications Ordered in ED Medications  albuterol (PROVENTIL HFA;VENTOLIN HFA) 108 (90 Base) MCG/ACT inhaler 2 puff (2 puffs Inhalation Given 03/20/17 0525)  lidocaine (XYLOCAINE) 2 %  jelly 1 application (1 application Urethral Given 03/20/17 0536)     Initial Impression / Assessment and Plan / ED Course  I have reviewed the triage vital signs and the nursing notes.  Pertinent labs & imaging results that were available during my care of the patient were reviewed by me and considered in my medical decision making (see chart for details).     Acute urinary retention noted on CT imaging.  No other intra-abdominal pathology.  No signs of bowel obstruction.  Foley catheter was exchanged in the emergency department with improvement in his symptoms at approximately 350 cc of urine output.  He feels much better.  He understands the importance of close urology follow-up.  He  has a team in [place].  He will continue to follow with them.  He understands to return to the ER for new or worsening symptoms.  These discharge instructions were given to the patient and the patient's spouse  Final Clinical Impressions(s) / ED Diagnoses   Final diagnoses:  Acute abdominal pain  Acute urinary retention  Complication of Foley catheter, initial encounter [facility])    New Prescriptions New Prescriptions   No medications on file     Azalia Bilis, MD 03/20/17 [phone]

## 2017-03-22 LAB — URINE CULTURE

## 2017-03-23 ENCOUNTER — Telehealth: Payer: Self-pay | Admitting: *Deleted

## 2017-03-23 NOTE — Telephone Encounter (Signed)
Post ED Visit - Positive Culture Follow-up  Culture report reviewed by antimicrobial stewardship pharmacist:  []  [place], Pharm.D. []  [place], Pharm.D., BCPS AQ-ID []  [place], Pharm.D., BCPS [x]  [place], Pharm.D., BCPS []  [place], [address]Pharm[address]D., BCPS, AAHIVP []  [place], Pharm.D., BCPS, AAHIVP []  [place] [facility], PharmD, BCPS []  [place], PharmD, BCPS []  [place], PharmD, BCPS  Positive urine culture No treatment, likely colonized and no further patient follow-up is required at this time.  [name], Arin Peral [facility] 03/23/2017, 9:49 AM

## 2017-03-24 ENCOUNTER — Emergency Department (HOSPITAL_BASED_OUTPATIENT_CLINIC_OR_DEPARTMENT_OTHER)
Admission: EM | Admit: 2017-03-24 | Discharge: 2017-03-24 | Disposition: A | Discharge status: Home / Self Care | Payer: Medicare Other | Attending: Emergency Medicine | Admitting: Emergency Medicine

## 2017-03-24 ENCOUNTER — Encounter (HOSPITAL_BASED_OUTPATIENT_CLINIC_OR_DEPARTMENT_OTHER): Payer: Self-pay

## 2017-03-24 DIAGNOSIS — F17210 Nicotine dependence, cigarettes, uncomplicated: Secondary | ICD-10-CM | POA: Diagnosis not present

## 2017-03-24 DIAGNOSIS — Y658 Other specified misadventures during surgical and medical care: Secondary | ICD-10-CM | POA: Diagnosis not present

## 2017-03-24 DIAGNOSIS — T83511A Infection and inflammatory reaction due to indwelling urethral catheter, initial encounter: Secondary | ICD-10-CM | POA: Insufficient documentation

## 2017-03-24 DIAGNOSIS — J449 Chronic obstructive pulmonary disease, unspecified: Secondary | ICD-10-CM | POA: Insufficient documentation

## 2017-03-24 DIAGNOSIS — R1030 Lower abdominal pain, unspecified: Secondary | ICD-10-CM | POA: Diagnosis present

## 2017-03-24 DIAGNOSIS — N390 Urinary tract infection, site not specified: Secondary | ICD-10-CM

## 2017-03-24 DIAGNOSIS — Z79899 Other long term (current) drug therapy: Secondary | ICD-10-CM | POA: Diagnosis not present

## 2017-03-24 LAB — URINALYSIS, MICROSCOPIC (REFLEX): RBC / HPF: NONE SEEN RBC/hpf (ref 0–5)

## 2017-03-24 LAB — BASIC METABOLIC PANEL
Anion gap: 8 (ref 5–15)
BUN: 8 mg/dL (ref 6–20)
CALCIUM: 8.8 mg/dL — AB (ref 8.9–10.3)
CO2: 28 mmol/L (ref 22–32)
CREATININE: 1.47 mg/dL — AB (ref 0.61–1.24)
Chloride: 105 mmol/L (ref 101–111)
GFR calc non Af Amer: 43 mL/min — ABNORMAL LOW (ref >60)
GFR, EST AFRICAN AMERICAN: 50 mL/min — AB (ref >60)
GLUCOSE: 88 mg/dL (ref 65–99)
Potassium: 3.9 mmol/L (ref 3.5–5.1)
Sodium: 141 mmol/L (ref 135–145)

## 2017-03-24 LAB — CBC WITH DIFFERENTIAL/PLATELET
BASOS PCT: 1 %
Basophils Absolute: 0.1 10*3/uL (ref 0.0–0.1)
EOS ABS: 0.4 10*3/uL (ref 0.0–0.7)
Eosinophils Relative: 4 %
HCT: 38.7 % — ABNORMAL LOW (ref 39.0–52.0)
Hemoglobin: 13.2 g/dL (ref 13.0–17.0)
Lymphocytes Relative: 21 %
Lymphs Abs: 1.9 10*3/uL (ref 0.7–4.0)
MCH: 31 pg (ref 26.0–34.0)
MCHC: 34.1 g/dL (ref 30.0–36.0)
MCV: 90.8 fL (ref 78.0–100.0)
MONO ABS: 0.7 10*3/uL (ref 0.1–1.0)
MONOS PCT: 8 %
Neutro Abs: 6.3 10*3/uL (ref 1.7–7.7)
Neutrophils Relative %: 66 %
PLATELETS: 220 10*3/uL (ref 150–400)
RBC: 4.26 MIL/uL (ref 4.22–5.81)
RDW: 13.2 % (ref 11.5–15.5)
WBC: 9.3 10*3/uL (ref 4.0–10.5)

## 2017-03-24 LAB — URINALYSIS, ROUTINE W REFLEX MICROSCOPIC
BILIRUBIN URINE: NEGATIVE
GLUCOSE, UA: NEGATIVE mg/dL
Hgb urine dipstick: NEGATIVE
KETONES UR: NEGATIVE mg/dL
NITRITE: POSITIVE — AB
PROTEIN: NEGATIVE mg/dL
Specific Gravity, Urine: 1.007 (ref 1.005–1.030)
pH: 7 (ref 5.0–8.0)

## 2017-03-24 MED ORDER — SULFAMETHOXAZOLE-TRIMETHOPRIM 800-160 MG PO TABS
1.0000 | ORAL_TABLET | Freq: Once | ORAL | Status: AC
  Administered 2017-03-24: 1 via ORAL
  Filled 2017-03-24: qty 1

## 2017-03-24 MED ORDER — SULFAMETHOXAZOLE-TRIMETHOPRIM 800-160 MG PO TABS: 1.0000 | ORAL_TABLET | Freq: Two times a day (BID) | ORAL | 0 refills | Status: DC

## 2017-03-24 NOTE — ED Provider Notes (Signed)
[facility] Provider Note   CSN: [id number] Arrival date & time: 03/24/17  1651     History   Chief Complaint Chief Complaint  Patient presents with  . Follow-up    HPI [place] is a 82 y.o. male.  HPI  82 year old male with a history of dementia, BPH with chronic Foley catheter presents with anxiety and fully catheter problems. Patient was seen on 7/13 and had his Foley catheter replaced for obstruction. Today he has noticed some suprapubic abdominal discomfort as well as low but of nausea without vomiting. No fevers or back pain. He has also noticed increased urinary output. He is changing his bag more often. This is concerning to him. He has a history of some anxiety and dementia and he seemed to be quite anxious and "agitated" according to the wife. She describes this as mostly anxiety. However he was not delirious, altered, or angry. This is happened to him before. He has chronic lower extremity edema that is unchanged. When EMS was bringing him up here the connection between the leg bag and the Foley catheter tubing came out and urine spilled out. However this has been reconnected and is working again.  Past Medical History:  Diagnosis Date  . AKI (acute kidney injury) (HCC)   . Anxiety   . Blind right eye   . BPH (benign prostatic hypertrophy) with urinary retention   . COPD (chronic obstructive pulmonary disease) (HCC)   . Dementia   . Depression   . High cholesterol   . Macular degeneration of both eyes   . Renal disorder   . Shortness of breath dyspnea   . Stroke [facility])    has residual slurred speech    Patient Active Problem List   Diagnosis Date Noted  . Enterocolitis 09/30/2016  . HLD (hyperlipidemia) 09/30/2016  . Tobacco abuse 09/30/2016  . Nausea vomiting and diarrhea 09/30/2016  . History of stroke in prior 3 months 01/22/2016  . Chronic tension-type headache, not intractable   . Acute frontal sinusitis   . Cognitive deficits as  late effect of cerebrovascular disease   . Gait disturbance, post-stroke   . Ataxia, late effect of cerebrovascular disease   . BPH (benign prostatic hyperplasia)   . Urinary retention   . Adjustment disorder with mixed anxiety and depressed mood   . Chronic obstructive pulmonary disease (HCC)   . Cephalalgia   . AKI (acute kidney injury) (HCC)   . Macular degeneration   . Prediabetes   . Hypokalemia   . Absolute anemia   . Aphasia   . Acute encephalopathy 11/25/2015  . Hyponatremia 11/25/2015  . Acute kidney injury (HCC) 11/25/2015  . Depression   . Anxiety   . COPD (chronic obstructive pulmonary disease) (HCC)   . ICH (intracerebral hemorrhage) (HCC) 11/22/2015    Past Surgical History:  Procedure Laterality Date  . NO PAST SURGERIES         Home Medications    Prior to Admission medications   Medication Sig Start Date End Date Taking? Authorizing Provider  albuterol (PROVENTIL HFA;VENTOLIN HFA) 108 (90 Base) MCG/ACT inhaler Inhale 1-2 puffs into the lungs every 6 (six) hours as needed for wheezing or shortness of breath. 12/28/16   Long, Arlyss Repress, MD  atorvastatin (LIPITOR) 20 MG tablet Take 1 tablet (20 mg total) by mouth daily at 6 PM. 09/03/16   Saguier, Ramon Dredge, PA-C  ciprofloxacin (CIPRO) 500 MG tablet Take 1 tablet (500 mg total) by mouth every  12 (twelve) hours. 02/08/17   Jerelyn ScottLinker, Martha, MD  diazepam (VALIUM) 5 MG tablet Take 1 tablet (5 mg total) by mouth every 8 (eight) hours as needed for anxiety. Can fill no sooner than October 24, 2016. 10/20/16   Saguier, Ramon DredgeEdward, PA-C  finasteride (PROSCAR) 5 MG tablet Take 5 mg by mouth daily. 09/22/16   [provider]  fluticasone (FLONASE) 50 MCG/ACT nasal spray Place 1 spray into both nostrils daily. Patient taking differently: Place 1 spray into both nostrils daily as needed.  12/11/15   Love, Evlyn KannerPamela S, PA-C  furosemide (LASIX) 20 MG tablet Take 1 tablet (20 mg total) by mouth daily. 03/14/17   Pricilla LovelessGoldston, Shakita Keir, MD    Multiple Vitamin (MULTIVITAMIN WITH MINERALS) TABS tablet Take 1 tablet by mouth daily.    [provider]  ondansetron (ZOFRAN-ODT) 4 MG disintegrating tablet Take 1 tablet (4 mg total) by mouth every 8 (eight) hours as needed for nausea or vomiting. 10/12/16   Melene PlanFloyd, Dan, DO  sulfamethoxazole-trimethoprim (BACTRIM DS,SEPTRA DS) 800-160 MG tablet Take 1 tablet by mouth 2 (two) times daily. 03/24/17 03/29/17  Pricilla LovelessGoldston, Valentine Barney, MD    Family History Family History  Problem Relation Age of Onset  . Colon cancer Mother   . Cerebral aneurysm Father     Social History Social History  Substance Use Topics  . Smoking status: Current Every Day Smoker    Packs/day: 0.50    Years: 60.00    Types: Cigarettes  . Smokeless tobacco: Never Used  . Alcohol use No     Allergies   Penicillins   Review of Systems Review of Systems  Constitutional: Negative for fever.  Cardiovascular: Positive for leg swelling (chronic).  Gastrointestinal: Positive for abdominal pain and nausea. Negative for vomiting.  Genitourinary: Positive for frequency.  Musculoskeletal: Negative for back pain.  Psychiatric/Behavioral: Negative for confusion (no confusion/ams from baseline).  All other systems reviewed and are negative.    Physical Exam Updated Vital Signs BP (!) 144/89 (BP Location: Right Arm)   Pulse 72   Temp 98.3 F (36.8 C) (Oral)   Resp 16   Ht 5\' 8"  (1.727 m)   Wt 59 kg (130 lb)   SpO2 98%   BMI 19.77 kg/m   Physical Exam  Constitutional: He appears well-developed and well-nourished.  HENT:  Head: Normocephalic and atraumatic.  Right Ear: External ear normal.  Left Ear: External ear normal.  Nose: Nose normal.  Eyes: Right eye exhibits no discharge. Left eye exhibits no discharge.  Neck: Neck supple.  Cardiovascular: Normal rate, regular rhythm and normal heart sounds.   Pulmonary/Chest: Effort normal and breath sounds normal.  Abdominal: Soft. He exhibits no distension.  There is tenderness in the suprapubic area. There is no CVA tenderness.  Genitourinary:  Genitourinary Comments: Foley catheter in place. No obvious penile lesions, testicular pain or swelling or penile tenderness. No redness.  Musculoskeletal: He exhibits edema (BLE edema, lower legs, ankles, feet).  Neurological: He is alert.  Awake, alert, no acute distress  Skin: Skin is warm and dry.  Psychiatric: His mood appears anxious.  Nursing note and vitals reviewed.    ED Treatments / Results  Labs (all labs ordered are listed, but only abnormal results are displayed) Labs Reviewed  URINALYSIS, ROUTINE W REFLEX MICROSCOPIC - Abnormal; Notable for the following:       Result Value   Nitrite POSITIVE (*)    Leukocytes, UA MODERATE (*)    All other components within normal limits  BASIC METABOLIC PANEL - Abnormal; Notable for the following:    Creatinine, Ser 1.47 (*)    Calcium 8.8 (*)    GFR calc non Af Amer 43 (*)    GFR calc Af Amer 50 (*)    All other components within normal limits  CBC WITH DIFFERENTIAL/PLATELET - Abnormal; Notable for the following:    HCT 38.7 (*)    All other components within normal limits  URINALYSIS, MICROSCOPIC (REFLEX) - Abnormal; Notable for the following:    Bacteria, UA RARE (*)    Squamous Epithelial / LPF 0-5 (*)    All other components within normal limits  URINE CULTURE    EKG  EKG Interpretation None       Radiology No results found.  Procedures Procedures (including critical care time)  Medications Ordered in ED Medications  sulfamethoxazole-trimethoprim (BACTRIM DS,SEPTRA DS) 800-160 MG per tablet 1 tablet (1 tablet Oral Given 03/24/17 1834)     Initial Impression / Assessment and Plan / ED Course  I have reviewed the triage vital signs and the nursing notes.  Pertinent labs & imaging results that were available during my care of the patient were reviewed by me and considered in my medical decision making (see chart for  details).     Given his abdominal pain, nausea, and perceived increased urine output or frequency, I will treat him as a urinary tract infection. His urine culture from 3 days ago was reviewed and thus he will be placed on Bactrim. Otherwise his blood work is unremarkable including normal white blood cell count and stable creatinine. No vomiting. He is afebrile. Discharge home with antibiotics and follow-up with PCP and urology. Discussed return precautions.  Final Clinical Impressions(s) / ED Diagnoses   Final diagnoses:  Urinary tract infection associated with indwelling urethral catheter, initial encounter [facility])    New Prescriptions Discharge Medication List as of 03/24/2017  6:24 PM    START taking these medications   Details  sulfamethoxazole-trimethoprim (BACTRIM DS,SEPTRA DS) 800-160 MG tablet Take 1 tablet by mouth 2 (two) times daily., Starting Tue 03/24/2017, Until Sun 03/29/2017, Print         Pricilla Loveless, MD 03/25/17 [phone]

## 2017-03-24 NOTE — ED Triage Notes (Signed)
Pt arrived via [facility]. Pt has a hx of dementia. Pt in room with spouse. Pt's spouse states pt's f/c leg bag broke today, and she had to replace it. She states pt became very agitated, and concerned for catheter which is the reason for the visit. Pt reports some suprapubic tenderness.

## 2017-03-26 LAB — URINE CULTURE

## 2017-03-27 ENCOUNTER — Emergency Department (HOSPITAL_COMMUNITY): Payer: Medicare Other

## 2017-03-27 ENCOUNTER — Observation Stay (HOSPITAL_COMMUNITY)
Admission: EM | Admit: 2017-03-27 | Discharge: 2017-03-28 | Disposition: A | Discharge status: Home / Self Care | Payer: Medicare Other | Attending: Internal Medicine | Admitting: Internal Medicine

## 2017-03-27 ENCOUNTER — Encounter (HOSPITAL_COMMUNITY): Payer: Self-pay

## 2017-03-27 ENCOUNTER — Telehealth: Payer: Self-pay

## 2017-03-27 DIAGNOSIS — J449 Chronic obstructive pulmonary disease, unspecified: Secondary | ICD-10-CM | POA: Insufficient documentation

## 2017-03-27 DIAGNOSIS — F17210 Nicotine dependence, cigarettes, uncomplicated: Secondary | ICD-10-CM | POA: Insufficient documentation

## 2017-03-27 DIAGNOSIS — Z880 Allergy status to penicillin: Secondary | ICD-10-CM | POA: Diagnosis not present

## 2017-03-27 DIAGNOSIS — Z8673 Personal history of transient ischemic attack (TIA), and cerebral infarction without residual deficits: Secondary | ICD-10-CM | POA: Diagnosis not present

## 2017-03-27 DIAGNOSIS — R778 Other specified abnormalities of plasma proteins: Secondary | ICD-10-CM | POA: Diagnosis present

## 2017-03-27 DIAGNOSIS — R600 Localized edema: Secondary | ICD-10-CM | POA: Diagnosis not present

## 2017-03-27 DIAGNOSIS — R531 Weakness: Secondary | ICD-10-CM | POA: Diagnosis present

## 2017-03-27 DIAGNOSIS — Z79899 Other long term (current) drug therapy: Secondary | ICD-10-CM | POA: Insufficient documentation

## 2017-03-27 DIAGNOSIS — H532 Diplopia: Secondary | ICD-10-CM | POA: Diagnosis not present

## 2017-03-27 DIAGNOSIS — R748 Abnormal levels of other serum enzymes: Secondary | ICD-10-CM | POA: Insufficient documentation

## 2017-03-27 DIAGNOSIS — N179 Acute kidney failure, unspecified: Secondary | ICD-10-CM | POA: Insufficient documentation

## 2017-03-27 DIAGNOSIS — R7989 Other specified abnormal findings of blood chemistry: Secondary | ICD-10-CM

## 2017-03-27 DIAGNOSIS — F0390 Unspecified dementia without behavioral disturbance: Secondary | ICD-10-CM | POA: Diagnosis not present

## 2017-03-27 LAB — COMPREHENSIVE METABOLIC PANEL
ALK PHOS: 67 U/L (ref 38–126)
ALT: 27 U/L (ref 17–63)
AST: 33 U/L (ref 15–41)
Albumin: 3.4 g/dL — ABNORMAL LOW (ref 3.5–5.0)
Anion gap: 9 (ref 5–15)
BUN: 14 mg/dL (ref 6–20)
CALCIUM: 8.9 mg/dL (ref 8.9–10.3)
CO2: 25 mmol/L (ref 22–32)
CREATININE: 1.99 mg/dL — AB (ref 0.61–1.24)
Chloride: 102 mmol/L (ref 101–111)
GFR, EST AFRICAN AMERICAN: 35 mL/min — AB (ref >60)
GFR, EST NON AFRICAN AMERICAN: 30 mL/min — AB (ref >60)
Glucose, Bld: 97 mg/dL (ref 65–99)
Potassium: 4.2 mmol/L (ref 3.5–5.1)
Sodium: 136 mmol/L (ref 135–145)
Total Bilirubin: 1.4 mg/dL — ABNORMAL HIGH (ref 0.3–1.2)
Total Protein: 6.5 g/dL (ref 6.5–8.1)

## 2017-03-27 LAB — URINALYSIS, ROUTINE W REFLEX MICROSCOPIC
Bilirubin Urine: NEGATIVE
GLUCOSE, UA: NEGATIVE mg/dL
Ketones, ur: NEGATIVE mg/dL
Leukocytes, UA: NEGATIVE
NITRITE: POSITIVE — AB
Protein, ur: NEGATIVE mg/dL
SPECIFIC GRAVITY, URINE: 1.003 — AB (ref 1.005–1.030)
Squamous Epithelial / LPF: NONE SEEN
pH: 7 (ref 5.0–8.0)

## 2017-03-27 LAB — CBC WITH DIFFERENTIAL/PLATELET
Basophils Absolute: 0 10*3/uL (ref 0.0–0.1)
Basophils Relative: 0 %
Eosinophils Absolute: 0.3 10*3/uL (ref 0.0–0.7)
Eosinophils Relative: 2 %
HEMATOCRIT: 40.1 % (ref 39.0–52.0)
HEMOGLOBIN: 13.4 g/dL (ref 13.0–17.0)
LYMPHS ABS: 1.3 10*3/uL (ref 0.7–4.0)
Lymphocytes Relative: 11 %
MCH: 30.5 pg (ref 26.0–34.0)
MCHC: 33.4 g/dL (ref 30.0–36.0)
MCV: 91.1 fL (ref 78.0–100.0)
MONOS PCT: 10 %
Monocytes Absolute: 1.2 10*3/uL — ABNORMAL HIGH (ref 0.1–1.0)
NEUTROS ABS: 9.1 10*3/uL — AB (ref 1.7–7.7)
NEUTROS PCT: 77 %
Platelets: 184 10*3/uL (ref 150–400)
RBC: 4.4 MIL/uL (ref 4.22–5.81)
RDW: 13.6 % (ref 11.5–15.5)
WBC: 11.9 10*3/uL — AB (ref 4.0–10.5)

## 2017-03-27 LAB — I-STAT TROPONIN, ED: TROPONIN I, POC: 0.11 ng/mL — AB (ref 0.00–0.08)

## 2017-03-27 LAB — TROPONIN I
TROPONIN I: 0.08 ng/mL — AB (ref <0.03)
Troponin I: 0.09 ng/mL (ref <0.03)

## 2017-03-27 MED ORDER — ADULT MULTIVITAMIN W/MINERALS CH
1.0000 | ORAL_TABLET | Freq: Every day | ORAL | Status: DC
  Administered 2017-03-28: 1 via ORAL
  Filled 2017-03-27: qty 1

## 2017-03-27 MED ORDER — FINASTERIDE 5 MG PO TABS
5.0000 mg | ORAL_TABLET | Freq: Every day | ORAL | Status: DC
  Administered 2017-03-28: 5 mg via ORAL
  Filled 2017-03-27: qty 1

## 2017-03-27 MED ORDER — ACETAMINOPHEN 325 MG PO TABS: 650.0000 mg | ORAL_TABLET | Freq: Four times a day (QID) | ORAL | Status: DC | PRN

## 2017-03-27 MED ORDER — SODIUM CHLORIDE 0.9 % IV SOLN
INTRAVENOUS | Status: DC
  Administered 2017-03-27: 20:00:00 via INTRAVENOUS

## 2017-03-27 MED ORDER — SODIUM CHLORIDE 0.9 % IV BOLUS (SEPSIS)
1000.0000 mL | Freq: Once | INTRAVENOUS | Status: AC
  Administered 2017-03-27: 1000 mL via INTRAVENOUS

## 2017-03-27 MED ORDER — ALBUTEROL SULFATE (2.5 MG/3ML) 0.083% IN NEBU: 2.5000 mg | INHALATION_SOLUTION | Freq: Four times a day (QID) | RESPIRATORY_TRACT | Status: DC | PRN

## 2017-03-27 MED ORDER — CEFTRIAXONE SODIUM 1 G IJ SOLR
1.0000 g | INTRAMUSCULAR | Status: DC
  Administered 2017-03-28: 1 g via INTRAVENOUS
  Filled 2017-03-27 (×2): qty 10

## 2017-03-27 MED ORDER — DIAZEPAM 5 MG PO TABS
5.0000 mg | ORAL_TABLET | Freq: Three times a day (TID) | ORAL | Status: DC | PRN
  Administered 2017-03-27 – 2017-03-28 (×2): 5 mg via ORAL
  Filled 2017-03-27 (×3): qty 1

## 2017-03-27 MED ORDER — ACETAMINOPHEN 650 MG RE SUPP: 650.0000 mg | Freq: Four times a day (QID) | RECTAL | Status: DC | PRN

## 2017-03-27 MED ORDER — VANCOMYCIN HCL IN DEXTROSE 1-5 GM/200ML-% IV SOLN
1000.0000 mg | INTRAVENOUS | Status: DC
  Administered 2017-03-28: 1000 mg via INTRAVENOUS
  Filled 2017-03-27: qty 200

## 2017-03-27 MED ORDER — LORAZEPAM 2 MG/ML IJ SOLN
1.0000 mg | Freq: Once | INTRAMUSCULAR | Status: AC
  Administered 2017-03-27: 1 mg via INTRAVENOUS
  Filled 2017-03-27: qty 1

## 2017-03-27 MED ORDER — ENOXAPARIN SODIUM 30 MG/0.3ML ~~LOC~~ SOLN
30.0000 mg | SUBCUTANEOUS | Status: DC
  Filled 2017-03-27: qty 0.3

## 2017-03-27 MED ORDER — ATORVASTATIN CALCIUM 20 MG PO TABS
20.0000 mg | ORAL_TABLET | Freq: Every day | ORAL | Status: DC
  Administered 2017-03-27: 20 mg via ORAL
  Filled 2017-03-27: qty 1

## 2017-03-27 MED ORDER — FLUTICASONE PROPIONATE 50 MCG/ACT NA SUSP: 1.0000 | Freq: Every day | NASAL | Status: DC | PRN

## 2017-03-27 NOTE — ED Notes (Signed)
Patient transported to CT 

## 2017-03-27 NOTE — Telephone Encounter (Signed)
Post ED Visit - Positive Culture Follow-up: Unsuccessful Patient Follow-up  Culture assessed and recommendations reviewed by:  []  Enzo BiNathan Batchelder, Pharm.D. []  Celedonio MiyamotoJeremy Frens, Pharm.D., BCPS AQ-ID []  Garvin FilaMike Maccia, Pharm.D., BCPS []  Georgina PillionElizabeth Martin, [address]Pharm[address]D., BCPS []  [place] Pham, [place]arm[place]D., BCPS, AAHIVP []  Estella HuskMichelle Turner, Pharm.D., BCPS, AAHIVP []  Lysle Pearlachel Rumbarger, PharmD, BCPS []  Casilda Carlsaylor Stone, PharmD, BCPS []  Pollyann SamplesAndy Johnston, PharmD, BCPS  Positive urineculture  []  Patient discharged without antimicrobial prescription and treatment is now indicated [x]  Organism is resistant to prescribed ED discharge antimicrobial []  Patient with positive blood cultures   Unable to contact patient after 3 attempts, letter will be sent to address on file  Jerry CarasCullom, Natividad Schlosser Burnett 03/27/2017, 12:19 PM

## 2017-03-27 NOTE — Consult Note (Signed)
CONSULTATION NOTE   Patient Name: David Duke Date of Encounter: 03/27/2017 Cardiologist: None (NEW)  Chief Complaint   Elevated troponin  Hospital Problem List   Active Problems:   Elevated troponin   Diplopia   Bilateral leg edema    HPI   David Duke is a 82 y.o. male who is being seen today for the evaluation of elevated troponin at the request of Dr. Darl Householder. [name] is an 82 year old male patient who has a history of numerous emergency room visits for urinary retention, bilateral lower extremity edema, acute abdominal pain and recurrent urinary tract infection with an indwelling catheter is been present since a stroke in 2017. He apparently presented for confusion and visual changes. He reports diplopia. He vehemently denies any chest pain or worsening shortness of breath. He says recently he's had some lower extremity edema which is mild and was put on Lasix for this. Past medical history is also significant for anxiety, BPH with urinary retention, COPD, dementia, depression, macular degeneration, dyslipidemia and chronic kidney disease. He had a previous echocardiogram in March 2017 at the time of his stroke which showed an LVEF of 65-70%, normal wall motion and impaired relaxation, there was mild left atrial enlargement. Initial troponin is elevated 0.11. EKG shows sinus rhythm at 93, RBBB and possible inferior infarct which is old. MRI of the brain was performed today which shows no acute intracranial abnormality. There is been even solution of the 2017 MRI findings of left occipital lobe hemorrhage with encephalomalacia. Chest x-ray shows aortic atherosclerosis without edema or consolidation.  PMHx   Past Medical History:  Diagnosis Date  . AKI (acute kidney injury) ([place])   . Anxiety   . Blind right eye   . BPH (benign prostatic hypertrophy) with urinary retention   . COPD (chronic obstructive pulmonary disease) ([place])   . Dementia   . Depression   . High  cholesterol   . Macular degeneration of both eyes   . Renal disorder   . Shortness of breath dyspnea   . Stroke [facility])    has residual slurred speech    Past Surgical History:  Procedure Laterality Date  . NO PAST SURGERIES      FAMHx   Family History  Problem Relation Age of Onset  . Colon cancer Mother   . Cerebral aneurysm Father     SOCHx    reports that he has been smoking Cigarettes.  He has a 15.00 pack-year smoking history. He has never used smokeless tobacco. He reports that he does not drink alcohol or use drugs.  Outpatient Medications   No current facility-administered medications on file prior to encounter.    Current Outpatient Prescriptions on File Prior to Encounter  Medication Sig Dispense Refill  . albuterol (PROVENTIL HFA;VENTOLIN HFA) 108 (90 Base) MCG/ACT inhaler Inhale 1-2 puffs into the lungs every 6 (six) hours as needed for wheezing or shortness of breath. 1 Inhaler 0  . atorvastatin (LIPITOR) 20 MG tablet Take 1 tablet (20 mg total) by mouth daily at 6 PM. 30 tablet 2  . ciprofloxacin (CIPRO) 500 MG tablet Take 1 tablet (500 mg total) by mouth every 12 (twelve) hours. 14 tablet 0  . diazepam (VALIUM) 5 MG tablet Take 1 tablet (5 mg total) by mouth every 8 (eight) hours as needed for anxiety. Can fill no sooner than October 24, 2016. 21 tablet 0  . finasteride (PROSCAR) 5 MG tablet Take 5 mg by mouth daily.    [name]  fluticasone (FLONASE) 50 MCG/ACT nasal spray Place 1 spray into both nostrils daily. (Patient taking differently: Place 1 spray into both nostrils daily as needed. )  2  . furosemide (LASIX) 20 MG tablet Take 1 tablet (20 mg total) by mouth daily. 3 tablet 0  . Multiple Vitamin (MULTIVITAMIN WITH MINERALS) TABS tablet Take 1 tablet by mouth daily.    . ondansetron (ZOFRAN-ODT) 4 MG disintegrating tablet Take 1 tablet (4 mg total) by mouth every 8 (eight) hours as needed for nausea or vomiting. 20 tablet 0  . sulfamethoxazole-trimethoprim  (BACTRIM DS,SEPTRA DS) 800-160 MG tablet Take 1 tablet by mouth 2 (two) times daily. 10 tablet 0    Inpatient Medications    Scheduled Meds:   Continuous Infusions:   PRN Meds:    ALLERGIES   Allergies  Allergen Reactions  . Penicillins Hives, Diarrhea and Nausea And Vomiting    ROS   Pertinent items noted in HPI and remainder of comprehensive ROS otherwise negative.  Vitals   Vitals:   03/27/17 1330 03/27/17 1345 03/27/17 1400 03/27/17 1511  BP: 109/73 (!) 115/54 110/71 111/70  Pulse: 88 92 90 91  Resp: (!) 27 (!) 24 14 (!) 24  Temp:      TempSrc:      SpO2: 100% 100% 100% 92%  Weight:      Height:        Intake/Output Summary (Last 24 hours) at 03/27/17 1546 Last data filed at 03/27/17 1519  Gross per 24 hour  Intake                0 ml  Output              825 ml  Net             -825 ml   Filed Weights   03/27/17 1235  Weight: 130 lb (59 kg)    Physical Exam   General appearance: alert and no distress Neck: no carotid bruit, no JVD and thyroid not enlarged, symmetric, no tenderness/mass/nodules Lungs: clear to auscultation bilaterally Heart: regular rate and rhythm Abdomen: soft, non-tender; bowel sounds normal; no masses,  no organomegaly Extremities: edema Trace sock line Pulses: 2+ and symmetric Skin: Pale, warm, dry Neurologic: Mental status: Alert, responds to questions, but repeated several stories Psych: Pleasant  Labs   Results for orders placed or performed during the hospital encounter of 03/27/17 (from the past 48 hour(s))  Comprehensive metabolic panel     Status: Abnormal   Collection Time: 03/27/17  1:15 PM  Result Value Ref Range   Sodium 136 135 - 145 mmol/L   Potassium 4.2 3.5 - 5.1 mmol/L   Chloride 102 101 - 111 mmol/L   CO2 25 22 - 32 mmol/L   Glucose, Bld 97 65 - 99 mg/dL   BUN 14 6 - 20 mg/dL   Creatinine, Ser 1.99 (H) 0.61 - 1.24 mg/dL   Calcium 8.9 8.9 - 10.3 mg/dL   Total Protein 6.5 6.5 - 8.1 g/dL   Albumin  3.4 (L) 3.5 - 5.0 g/dL   AST 33 15 - 41 U/L   ALT 27 17 - 63 U/L   Alkaline Phosphatase 67 38 - 126 U/L   Total Bilirubin 1.4 (H) 0.3 - 1.2 mg/dL   GFR calc non Af Amer 30 (L) >60 mL/min   GFR calc Af Amer 35 (L) >60 mL/min    Comment: (NOTE) The eGFR has been calculated using the CKD EPI equation. This  calculation has not been validated in all clinical situations. eGFR's persistently <60 mL/min signify possible Chronic Kidney Disease.    Anion gap 9 5 - 15  CBC with Differential/Platelet     Status: Abnormal   Collection Time: 03/27/17  1:15 PM  Result Value Ref Range   WBC 11.9 (H) 4.0 - 10.5 K/uL   RBC 4.40 4.22 - 5.81 MIL/uL   Hemoglobin 13.4 13.0 - 17.0 g/dL   HCT 40.1 39.0 - 52.0 %   MCV 91.1 78.0 - 100.0 fL   MCH 30.5 26.0 - 34.0 pg   MCHC 33.4 30.0 - 36.0 g/dL   RDW 13.6 11.5 - 15.5 %   Platelets 184 150 - 400 K/uL   Neutrophils Relative % 77 %   Neutro Abs 9.1 (H) 1.7 - 7.7 K/uL   Lymphocytes Relative 11 %   Lymphs Abs 1.3 0.7 - 4.0 K/uL   Monocytes Relative 10 %   Monocytes Absolute 1.2 (H) 0.1 - 1.0 K/uL   Eosinophils Relative 2 %   Eosinophils Absolute 0.3 0.0 - 0.7 K/uL   Basophils Relative 0 %   Basophils Absolute 0.0 0.0 - 0.1 K/uL  Urinalysis, Routine w reflex microscopic     Status: Abnormal   Collection Time: 03/27/17  1:29 PM  Result Value Ref Range   Color, Urine YELLOW YELLOW   APPearance CLEAR CLEAR   Specific Gravity, Urine 1.003 (L) 1.005 - 1.030   pH 7.0 5.0 - 8.0   Glucose, UA NEGATIVE NEGATIVE mg/dL   Hgb urine dipstick SMALL (A) NEGATIVE   Bilirubin Urine NEGATIVE NEGATIVE   Ketones, ur NEGATIVE NEGATIVE mg/dL   Protein, ur NEGATIVE NEGATIVE mg/dL   Nitrite POSITIVE (A) NEGATIVE   Leukocytes, UA NEGATIVE NEGATIVE   RBC / HPF 0-5 0 - 5 RBC/hpf   WBC, UA 0-5 0 - 5 WBC/hpf   Bacteria, UA RARE (A) NONE SEEN   Squamous Epithelial / LPF NONE SEEN NONE SEEN   Mucous PRESENT   I-stat troponin, ED     Status: Abnormal   Collection Time:  03/27/17  1:40 PM  Result Value Ref Range   Troponin i, poc 0.11 (HH) 0.00 - 0.08 ng/mL   Comment NOTIFIED PHYSICIAN    Comment 3            Comment: Due to the release kinetics of cTnI, a negative result within the first hours of the onset of symptoms does not rule out myocardial infarction with certainty. If myocardial infarction is still suspected, repeat the test at appropriate intervals.     ECG   Sinus with right bundle branch block and inferior Q waves - Personally Reviewed  Telemetry   Sinus rhythm - Personally Reviewed  Radiology   Dg Chest 2 View  Result Date: 03/27/2017 CLINICAL DATA:  Altered mental status EXAM: CHEST  2 VIEW COMPARISON:  February 08, 2017 FINDINGS: There is no edema or consolidation. Heart size and pulmonary vascularity are normal. No adenopathy. There is aortic atherosclerosis. There is degenerative change in the thoracic spine. There is slight anterior wedging of a midthoracic vertebral body. IMPRESSION: Aortic atherosclerosis.  No edema or consolidation. Aortic Atherosclerosis (ICD10-I70.0). Electronically Signed   By: Lowella Grip III M.D.   On: 03/27/2017 13:55   Mr Brain Wo Contrast  Result Date: 03/27/2017 CLINICAL DATA:  82 year old male with confusion and gait changes beginning yesterday. Recently treated for UTI. Altered mental status. EXAM: MRI HEAD WITHOUT CONTRAST TECHNIQUE: Multiplanar, multiecho pulse sequences of  the brain and surrounding structures were obtained without intravenous contrast. COMPARISON:  Head CT without contrast 1341 hours today and earlier. Brain MRI 01/11/2016. FINDINGS: Brain: Left occipital lobe encephalomalacia and hemosiderin related to the March 2017 intra-axial hemorrhage. Superimposed nearby a left parietal lobe scattered hemosiderin. There is also a chronic microhemorrhage in the contralateral right parietal lobe (series 8, image 16), and there is mild superficial siderosis at the right superior frontal gyrus,  precentral gyrus (image 19). Heterogeneous diffusion signal in the brainstem but no convincing restricted diffusion to suggest acute infarction. No midline shift, mass effect, evidence of mass lesion, ventriculomegaly, extra-axial collection or acute intracranial hemorrhage. Cervicomedullary junction and pituitary are within normal limits. Patchy and confluent bilateral cerebral white matter T2 and FLAIR hyperintensity in a nonspecific configuration. Similar patchy T2 hyperintensity in the pons. The deep gray matter nuclei and cerebellum remain within normal limits for age. Vascular: Major intracranial vascular flow voids are stable since 2017. Skull and upper cervical spine: Negative. Visualized bone marrow signal is within normal limits. Sinuses/Orbits: Stable an negative. Other: Mastoid air cells remain clear. Visible internal auditory structures appear normal. Trace retained secretions in the nasopharynx. Negative scalp soft tissues. IMPRESSION: 1.  No acute intracranial abnormality. 2. Evolution since the 2017 MRI of the left occipital lobe hemorrhage with encephalomalacia. There is also scattered hemosiderin in both parietal lobes, and a small area of superficial siderosis at the right superior precentral gyrus. 3. Superimposed moderately advanced but nonspecific signal changes in the cerebral white matter and pons, favor small vessel disease related. Electronically Signed   By: Genevie Ann M.D.   On: 03/27/2017 15:21    Cardiac Studies   N/A  Impression   Active Problems:   Elevated troponin   Diplopia   Bilateral leg edema   Recommendation   1. Mr. Claiborne Rigg presents with diplopia, visual changes, confusion and agitation. His wife says this is different than his baseline level of dementia. He also has macular degeneration. For some reason a troponin was assessed as he flatly denies any chest pain. Troponin is elevated 0.11. He is noted to have some mild lower extremity edema. His last echo in  March 2017 demonstrated normal LV EF with mild diastolic dysfunction. The etiology of his elevated troponins unclear however I doubt acute coronary syndrome. I would recommend trending troponins and would consider repeating his echocardiogram. Other possibilities could include intracranial hemorrhage however his MRI has just resulted without any evidence of new bleeding.   Thanks for the consultation. Cardiology will follow with you if he is admitted.  Time Spent Directly with Patient:  I have spent a total of 35 minutes with the patient reviewing hospital notes, telemetry, EKGs, labs and examining the patient as well as establishing an assessment and plan that was discussed personally with the patient. > 50% of time was spent in direct patient care.  Length of Stay:  LOS: 0 days   Pixie Casino, MD, [place]  Attending Cardiologist  Direct Dial: [phone]  Fax: [phone]  Website: [email or web address] 03/27/2017, 3:46 PM

## 2017-03-27 NOTE — H&P (Signed)
Date: 03/27/2017               Patient Name:  David Duke MRN: [id number]  DOB: [birth year] Age / Sex: 82 y.o., male   PCP: Maye Hides, [place]         Medical Service: Internal Medicine Teaching Service         Attending Physician: Dr. Silverio Lay Gonzella Lex, MD    First Contact: [name] Pager: [phone]  Second Contact: Dr. John Giovanni Pager: [phone]       After Hours (After 5p/  First Contact Pager: [phone]  weekends / holidays): Second Contact Pager: [phone]   Chief Complaint: blurry vision, altered gait, altered mental status since this morning   History of Present Illness: The patient is an 82 year old male with history of CVA, BPH with chronic Foley catheter, dementia, and anxiety presenting with blurry vision per his report and, per his wife, altered mental status and altered gait since this morning. The patient was recently seen on the 17th in the ED when he was discharged with bactrim for a UTI due to prior cultures that grew Enterobacter sensitive to bactrim. However repeat cultures on the 17th grew out Enterococcus faecalis for which bactrim sensitivity was not noted. The patient is typically quite mobile at baseline. He also endorses recently taking three pills of low-dose lasix and apparently having over 5 full bags of 1L of urine this morning. Per wife his urine has not appeared out of the ordinary color. Finally the patient notes new-onset blurry vision since this morning. He has never had blurry vision before even with a prior stroke. He is seen regularly by Dr. Luciana Axe for eye injections for macular degenerations and has had very good vision at baseline. With regards to his mental status, the wife reports that he is more confused than usual and apparently also noted some facial droop this morning which is what caused her to bring him in.The patient does not have any other complaints or systemic symptoms on ROS.    Meds:  No outpatient prescriptions have  been marked as taking for the 03/27/17 encounter [facility] Encounter).   Allergies: Allergies as of 03/27/2017 - Review Complete 03/27/2017  Allergen Reaction Noted  . Penicillins Hives, Diarrhea, and Nausea And Vomiting 04/07/2016   Past Medical History:  Diagnosis Date  . AKI (acute kidney injury) ([facility])   . Anxiety   . Blind right eye   . BPH (benign prostatic hypertrophy) with urinary retention   . COPD (chronic obstructive pulmonary disease) ([facility])   . Dementia   . Depression   . High cholesterol   . Macular degeneration of both eyes   . Renal disorder   . Shortness of breath dyspnea   . Stroke [facility])    has residual slurred speech   Family History: No pertinent FH  Social History: Lives with his wife, ongoing tobacco use.    Review of Systems: A complete ROS was negative except as per HPI.   Physical Exam: Blood pressure 116/75, pulse 87, temperature 98 F (36.7 C), temperature source Oral, resp. rate (!) 24, height 5\' 8"  (1.727 m), weight 59 kg (130 lb), SpO2 100 %. Physical Exam  Constitutional: He appears well-developed and well-nourished. No distress.  HENT:  Head: Normocephalic and atraumatic.  Right Ear: External ear normal.  Left Ear: External ear normal.  Mouth/Throat: Oropharynx is clear and moist. No oropharyngeal exudate.  Eyes: Pupils are equal, round, and reactive to light. Conjunctivae  and EOM are normal. Right eye exhibits no discharge. Left eye exhibits no discharge. No scleral icterus.  Cardiovascular: Normal rate, regular rhythm, normal heart sounds and intact distal pulses.  Exam reveals no gallop and no friction rub.   No murmur heard. Pulmonary/Chest: Effort normal and breath sounds normal. No respiratory distress. He has no wheezes. He has no rales. He exhibits no tenderness.  Abdominal: Soft. Bowel sounds are normal.   EKG: personally reviewed my interpretation is sinus rhythm with evidence of old RBBB without new Q waves or ST changes  CXR:  not obtained   Assessment & Plan by Problem: Active Problems:   Elevated troponin   Diplopia   Bilateral leg edema  1. Complicated UTI: WBC slightly elevated to 11.9 with neutrophilia. No fevers or tachycardia, slight tachypnea possibly in setting of anxiety and missed dose of diazepam this morning. Ongoing UTI likely explains AMS and possible gait disturbance. U/A with +nitrites and rare bacteria. Cx from 13th grew >100K CFU Enterobacter sensitive to ceftriaxone/cipro/gent/imipenem/zosyn/bactrim, and cx from 17th grew >100K CFU Enterococcus sensitive to amp/levo/bactrim/vanc. With regard to Enterobacter, want to avoid fluoroquinolones at his age given risk of C diff, prefer to avoid zosyn given his AKI on CKD, was previously on bactrim and may not have responded, leaving ceftriaxone as the best choice. With regard to Enterococcus, patient has an penicillin allergy, vanc is the best option. Want to cover for both Enterobacter and Enterococcus, therefore will start patient on vanc/ceftriaxone.  - Remove foley cath, get mid-stream urine sample for repeat cx, and replace with new foley cath  - Start Vanc dosing per pharm  - Start Ceftriaxone per pharmacy - Narrow pending cx results   2. Monocular Diplopia of L eye: No intracranial hemorrhage, head CT and MRI negative for acute changes. Spoke with neurology, Dr. Amada Jupiter, who thinks posterior circulation stroke would have been detected on MRI and thus is an unlikely caused of this acute mono-ocular diplopia. Spoke with Dr. 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 Nothing to do inpatient per ophtho. - Patient to follow up with outpatient ophthalmology upon discharge, with either Dr. Luciana Axe or at the [facility]   3. AKI on CKD: Likely secondary to overdiuresis from lasix and possibly from bactrim. Baseline is CKD III with GFR around 50 and Cr around 1.5, now at 1.9.  - S/p NS 1L bolus  - Start mIVF NS at 75  mL/hr   4. Elevated Troponin: Presented with stat trop of 0.11, downtrended to 0.09 on Trop I x 1. Likely slight elevation in setting of AKI on CKD. No cardiac history. EKG not suggestive of any ST changes or new BBBs. Cards has been consulted and seen patient, recommended echo, low suspicion of cardiac etiology.  - F/u echo per cards  - Stop trending trops   5. Anxiety: Takes valium 7.5 mg q6hr at home. Spoke with patient about importance of switching to another medication class that is less sedating at his age and also more effective for anxiety.  - S/p ativan 1 mg in the ED  - Will continue at 5 mg q6hr.  - Will encourage taper as outpatient   6. History of CVA: Head CT/MRI negative as above. No concern for posterior circulation stroke per neurology. No persistent neuro deficits on exam. Does not take aspirin or any anticoagulation at home.   7. CAD: Continue home lipitor 20 mg qd.    8. COPD: Continue albuterol 2.5 mg  q6h prn.   9. BPH: Continue home finasteride 5 mg po qd.   Diet: HH DVT ppx: Lovenox Dispo: Admit patient to Observation with expected length of stay less than 2 midnights.  Signed: Zeb ComfortSaripalli, Srinivas, Medical Student 03/27/2017, 4:29 PM  Pager: [phone]   Attestation for Student Documentation:  I personally was present and performed or re-performed the history, physical exam and medical decision-making activities of this service and have verified that the service and findings are accurately documented in the student's note.  [name], Kaydie Petsch, MD 03/28/2017, 11:49 AM

## 2017-03-27 NOTE — ED Notes (Signed)
Pt returned to room and placed back on monitor.  

## 2017-03-27 NOTE — ED Notes (Signed)
Patient transported to MRI 

## 2017-03-27 NOTE — Progress Notes (Addendum)
Pharmacy Antibiotic Note  David BarlowDavid R Duke is a 82 y.o. male admitted on 03/27/2017 with UTI.  Pharmacy has been consulted for Rocephin / Vancomycin  Dosing.  Has taken Keflex in the past  With enterococcus and enterobacter in urine culture, foley catheter in place  Plan: Rocephin 1 gram iv Q 24 hours Vancomycin 1 gram iv Q 48   Height: 5\' 8"  (172.7 cm) Weight: 130 lb (59 kg) IBW/kg (Calculated) : 68.4  Temp (24hrs), Avg:98 F (36.7 C), Min:98 F (36.7 C), Max:98 F (36.7 C)   Recent Labs Lab 03/24/17 1721 03/27/17 1315  WBC 9.3 11.9*  CREATININE 1.47* 1.99*    Estimated Creatinine Clearance: 24.7 mL/min (A) (by C-G formula based on SCr of 1.99 mg/dL (H)).    Allergies  Allergen Reactions  . Penicillins Hives, Diarrhea and Nausea And Vomiting    Has patient had a PCN reaction causing immediate rash, facial/tongue/throat swelling, SOB or lightheadedness with hypotension: Yes Has patient had a PCN reaction causing severe rash involving mucus membranes or skin necrosis: No Has patient had a PCN reaction that required hospitalization: No Has patient had a PCN reaction occurring within the last 10 years: No If all of the above answers are "NO", then may proceed with Cephalosporin use.   [name]. Amoxicillin Hives, Diarrhea and Nausea And Vomiting  . Sulfa Antibiotics Nausea Only     Thank you for allowing pharmacy to be a part of this patient's care. Okey RegalLisa Joshuah Minella, PharmD [phone] 03/27/2017 6:55 PM

## 2017-03-27 NOTE — ED Triage Notes (Signed)
Pt BIB [facility] from home where pt reports he [facility]. At that time, catheter was also replace. Pt has catheter from previous stroke in 2017. Pt. Wife reports gait changes since yesterday and some confusion. Pt reports some visual changes/floaters. Pt has hx of macular degeneration.  A&O at arrival.

## 2017-03-27 NOTE — ED Provider Notes (Signed)
[facility]-EMERGENCY DEPT Provider Note   CSN: [id number] Arrival date & time: 03/27/17  1231     History   Chief Complaint Chief Complaint  Patient presents with  . Weakness    HPI David Duke is a 82 y.o. male.  HPI Patient, with a past medical history of dementia, macular degeneration, prior stroke, COPD, BPH, presents to ED for 1 day history of "seeing floaters" increase anxiety and agitation, per wife. She also reports gait abnormalities and slurred speech. Appears that patient has residual slurred speech from stroke. She reports last time he appeared "normal" was a few hours ago. He denies any head injury or loss of consciousness. Stroke last year was due to head bleeds the patient is not on any blood thinners. He had a UTI and was seen for 3 days ago and has been taking his Bactrim as prescribed. He also reports urinary frequency. Patient has a Foley catheter and that was changed 3 days ago. He denies chest pain, shortness of breath, hemoptysis, nausea, vomiting, abdominal pain.  Past Medical History:  Diagnosis Date  . AKI (acute kidney injury) (HCC)   . Anxiety   . Blind right eye   . BPH (benign prostatic hypertrophy) with urinary retention   . COPD (chronic obstructive pulmonary disease) (HCC)   . Dementia   . Depression   . High cholesterol   . Macular degeneration of both eyes   . Renal disorder   . Shortness of breath dyspnea   . Stroke [facility])    has residual slurred speech    Patient Active Problem List   Diagnosis Date Noted  . Enterocolitis 09/30/2016  . HLD (hyperlipidemia) 09/30/2016  . Tobacco abuse 09/30/2016  . Nausea vomiting and diarrhea 09/30/2016  . History of stroke in prior 3 months 01/22/2016  . Chronic tension-type headache, not intractable   . Acute frontal sinusitis   . 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 Gait disturbance, post-stroke   . Ataxia, late effect of cerebrovascular disease   . BPH (benign  prostatic hyperplasia)   . Urinary retention   . Adjustment disorder with mixed anxiety and depressed mood   . Chronic obstructive pulmonary disease (HCC)   . Cephalalgia   . AKI (acute kidney injury) (HCC)   . Macular degeneration   . Prediabetes   . Hypokalemia   . Absolute anemia   . Aphasia   . Acute encephalopathy 11/25/2015  . Hyponatremia 11/25/2015  . Acute kidney injury (HCC) 11/25/2015  . Depression   . Anxiety   . COPD (chronic obstructive pulmonary disease) (HCC)   . ICH (intracerebral hemorrhage) (HCC) 11/22/2015    Past Surgical History:  Procedure Laterality Date  . NO PAST SURGERIES         Home Medications    Prior to Admission medications   Medication Sig Start Date End Date Taking? Authorizing Provider  albuterol (PROVENTIL HFA;VENTOLIN HFA) 108 (90 Base) MCG/ACT inhaler Inhale 1-2 puffs into the lungs every 6 (six) hours as needed for wheezing or shortness of breath. 12/28/16   Long, Arlyss Repress, MD  atorvastatin (LIPITOR) 20 MG tablet Take 1 tablet (20 mg total) by mouth daily at 6 PM. 09/03/16   Saguier, Ramon Dredge, PA-C  ciprofloxacin (CIPRO) 500 MG tablet Take 1 tablet (500 mg total) by mouth every 12 (twelve) hours. 02/08/17   Jerelyn Scott, MD  diazepam (VALIUM) 5 MG tablet Take 1 tablet (5 mg total) by mouth every 8 (eight) hours  as needed for anxiety. Can fill no sooner than October 24, 2016. 10/20/16   Saguier, Ramon DredgeEdward, PA-C  finasteride (PROSCAR) 5 MG tablet Take 5 mg by mouth daily. 09/22/16   [provider]  fluticasone (FLONASE) 50 MCG/ACT nasal spray Place 1 spray into both nostrils daily. Patient taking differently: Place 1 spray into both nostrils daily as needed.  12/11/15   Love, Evlyn KannerPamela S, PA-C  furosemide (LASIX) 20 MG tablet Take 1 tablet (20 mg total) by mouth daily. 03/14/17   Pricilla LovelessGoldston, Scott, MD  Multiple Vitamin (MULTIVITAMIN WITH MINERALS) TABS tablet Take 1 tablet by mouth daily.    [provider]  ondansetron (ZOFRAN-ODT) 4  MG disintegrating tablet Take 1 tablet (4 mg total) by mouth every 8 (eight) hours as needed for nausea or vomiting. 10/12/16   Melene PlanFloyd, Dan, DO  sulfamethoxazole-trimethoprim (BACTRIM DS,SEPTRA DS) 800-160 MG tablet Take 1 tablet by mouth 2 (two) times daily. 03/24/17 03/29/17  Pricilla LovelessGoldston, Scott, MD    Family History Family History  Problem Relation Age of Onset  . Colon cancer Mother   . Cerebral aneurysm Father     Social History Social History  Substance Use Topics  . Smoking status: Current Every Day Smoker    Packs/day: 0.25    Years: 60.00    Types: Cigarettes  . Smokeless tobacco: Never Used  . Alcohol use No     Allergies   Penicillins   Review of Systems Review of Systems  Unable to perform ROS: Dementia  Constitutional: Positive for appetite change.  Cardiovascular: Negative for chest pain and palpitations.  Neurological: Positive for weakness.  Psychiatric/Behavioral: Positive for agitation and confusion.     Physical Exam Updated Vital Signs BP 105/71 (BP Location: Right Arm)   Pulse 92   Temp 98 F (36.7 C) (Oral)   Resp 14   Ht 5\' 8"  (1.727 m)   Wt 59 kg (130 lb)   SpO2 92%   BMI 19.77 kg/m   Physical Exam  Constitutional: He appears well-developed and well-nourished. No distress.  HENT:  Head: Normocephalic and atraumatic.  Nose: Nose normal.  Eyes: Conjunctivae and EOM are normal. Left eye exhibits no discharge. No scleral icterus.  Neck: Normal range of motion. Neck supple.  Cardiovascular: Normal rate, regular rhythm, normal heart sounds and intact distal pulses.  Exam reveals no gallop and no friction rub.   No murmur heard. Pulmonary/Chest: Effort normal and breath sounds normal. No respiratory distress.  Abdominal: Soft. Bowel sounds are normal. He exhibits no distension. There is no tenderness. There is no guarding.  Musculoskeletal: Normal range of motion. He exhibits edema (Bilateral lower extremity edema.).  Neurological: He is alert. He  exhibits normal muscle tone. Coordination normal.  Skin: Skin is warm and dry. No rash noted.  Psychiatric: He has a normal mood and affect.  Nursing note and vitals reviewed.    ED Treatments / Results  Labs (all labs ordered are listed, but only abnormal results are displayed) Labs Reviewed  CBC WITH DIFFERENTIAL/PLATELET  BASIC METABOLIC PANEL    EKG  EKG Interpretation  Date/Time:  Friday March 27 2017 12:36:52 EDT Ventricular Rate:  93 PR Interval:    QRS Duration: 97 QT Interval:  359 QTC Calculation: 447 R Axis:   -94 Text Interpretation:  Sinus rhythm Right ventricular hypertrophy Inferior infarct, old No significant change since last tracing Confirmed by Richardean CanalYao, Rontae H [phone]([id number]) on 03/27/2017 1:00:41 PM       Radiology No results found.  Procedures Procedures (including critical care time)  Medications Ordered in ED Medications - No data to display   Initial Impression / Assessment and Plan / ED Course  I have reviewed the triage vital signs and the nursing notes.  Pertinent labs & imaging results that were available during my care of the patient were reviewed by me and considered in my medical decision making (see chart for details).    Patient with past medical history of dementia presents to ED with wife for evaluation of generalized weakness, gait changes, increased anxiety and vision changes since yesterday. Patient was seen 3 days ago at [facility] and was diagnosed with UTI and placed on Bactrim. He has been taking Bactrim as directed. He reports urinary frequency as well. Due to the dementia it is difficult to obtain review of systems from this patient. Wife also reports decreased appetite since yesterday. On physical exam he is alert to person, place and current events. He has no sensory deficit. Strength normal in bilateral upper and lower extremities. He is afebrile with no history of fever. Abdomen soft, nontender not distended. Due to  patient's history of dementia and gait changes, obtain CT and MRI which both returned as negative for acute process. Troponin was elevated at 0.11. ABC showed leukocytosis at 11.9. CMP showed elevation in creatinine to 1.99 which is elevated from baseline. It appears that patient could be suffering from deck-induced nephropathy/AKI due to his Bactrim use. Nurse called, should switch to Levaquin from Bactrim. Cardiology was contacted who will obtain serial troponins. Spoke to hospitalist team who will admit the patient. Appreciate her help with this patient.  Final Clinical Impressions(s) / ED Diagnoses   Final diagnoses:  None    New Prescriptions New Prescriptions   No medications on file     Dietrich Pates, Cordelia Poche 03/27/17 2031    Charlynne Pander, MD 03/28/17 [phone]

## 2017-03-27 NOTE — ED Notes (Signed)
ED Provider at bedside. 

## 2017-03-27 NOTE — ED Notes (Signed)
Attempted report 

## 2017-03-27 NOTE — Progress Notes (Signed)
ED Antimicrobial Stewardship Positive Culture Follow Up   David BarlowDavid R Duke is an 82 y.o. male who [facility]2017 with a chief complaint of  Chief Complaint  Patient presents with  . Follow-up    Recent Results (from the past 720 hour(s))  Urine culture     Status: Abnormal   Collection Time: 03/20/17  3:50 AM  Result Value Ref Range Status   Specimen Description URINE, RANDOM  Final   Special Requests NONE  Final   Culture >=100,000 COLONIES/mL ENTEROBACTER SPECIES (A)  Final   Report Status 03/22/2017 FINAL  Final   Organism ID, Bacteria ENTEROBACTER SPECIES (A)  Final      Susceptibility   Enterobacter species - MIC*    CEFAZOLIN >=64 RESISTANT Resistant     CEFTRIAXONE <=1 SENSITIVE Sensitive     CIPROFLOXACIN <=0.25 SENSITIVE Sensitive     GENTAMICIN <=1 SENSITIVE Sensitive     IMIPENEM <=0.25 SENSITIVE Sensitive     NITROFURANTOIN 64 INTERMEDIATE Intermediate     TRIMETH/SULFA <=20 SENSITIVE Sensitive     PIP/TAZO <=4 SENSITIVE Sensitive     * >=100,000 COLONIES/mL ENTEROBACTER SPECIES  Urine culture     Status: Abnormal   Collection Time: 03/24/17  5:21 PM  Result Value Ref Range Status   Specimen Description URINE, RANDOM  Final   Special Requests NONE  Final   Culture >=100,000 COLONIES/mL ENTEROCOCCUS FAECALIS (A)  Final   Report Status 03/26/2017 FINAL  Final   Organism ID, Bacteria ENTEROCOCCUS FAECALIS (A)  Final      Susceptibility   Enterococcus faecalis - MIC*    AMPICILLIN <=2 SENSITIVE Sensitive     LEVOFLOXACIN 1 SENSITIVE Sensitive     NITROFURANTOIN <=16 SENSITIVE Sensitive     VANCOMYCIN 2 SENSITIVE Sensitive     * >=100,000 COLONIES/mL ENTEROCOCCUS FAECALIS    [x]  Treated with Septra, organism resistant to prescribed antimicrobial []  Patient discharged originally without antimicrobial agent and treatment is now indicated  Note he had a urine culture on 7/13 with Enterobacter that is also susceptible to quinolones.  New  antibiotic prescription:  Stop Septra Levaquin 250mg  PO qday x 5 days  ED Provider: Audry Piliyler Mohr, PA-C   Sallee Provencalurner, Cressida Milford S 03/27/2017, 7:57 AM Infectious Diseases Pharmacist Phone# [phone]

## 2017-03-27 NOTE — Telephone Encounter (Signed)
Pt's family did not return call from today. Pt needs change in Abx from Bactrim to Levaquin 250mg  PO q day x 5 days. Pt came to ED today 03/27/17 and this nurse notified Dietrich PatesHina Khatri PA of this need. Letter will not be sent to home.

## 2017-03-27 NOTE — ED Notes (Signed)
Pt transported to MRI 

## 2017-03-28 ENCOUNTER — Observation Stay (HOSPITAL_BASED_OUTPATIENT_CLINIC_OR_DEPARTMENT_OTHER): Payer: Medicare Other

## 2017-03-28 DIAGNOSIS — R7989 Other specified abnormal findings of blood chemistry: Secondary | ICD-10-CM | POA: Diagnosis not present

## 2017-03-28 DIAGNOSIS — R531 Weakness: Secondary | ICD-10-CM

## 2017-03-28 LAB — CBC WITH DIFFERENTIAL/PLATELET
Basophils Absolute: 0 10*3/uL (ref 0.0–0.1)
Basophils Relative: 0 %
Eosinophils Absolute: 0.6 10*3/uL (ref 0.0–0.7)
Eosinophils Relative: 6 %
HCT: 36.1 % — ABNORMAL LOW (ref 39.0–52.0)
HEMOGLOBIN: 11.8 g/dL — AB (ref 13.0–17.0)
LYMPHS ABS: 1.5 10*3/uL (ref 0.7–4.0)
LYMPHS PCT: 16 %
MCH: 29.9 pg (ref 26.0–34.0)
MCHC: 32.7 g/dL (ref 30.0–36.0)
MCV: 91.4 fL (ref 78.0–100.0)
MONOS PCT: 10 %
Monocytes Absolute: 1 10*3/uL (ref 0.1–1.0)
NEUTROS PCT: 68 %
Neutro Abs: 6.6 10*3/uL (ref 1.7–7.7)
Platelets: 178 10*3/uL (ref 150–400)
RBC: 3.95 MIL/uL — AB (ref 4.22–5.81)
RDW: 13.7 % (ref 11.5–15.5)
WBC: 9.8 10*3/uL (ref 4.0–10.5)

## 2017-03-28 LAB — ECHOCARDIOGRAM COMPLETE
EERAT: 8.28
EWDT: 229 ms
FS: 46 % — AB (ref 28–44)
Height: 68 in
IVS/LV PW RATIO, ED: 0.82
LA diam end sys: 32 mm
LA diam index: 1.88 cm/m2
LA vol A4C: 27.4 ml
LASIZE: 32 mm
LAVOL: 36.7 mL
LAVOLIN: 21.6 mL/m2
LV TDI E'LATERAL: 10.6
LV TDI E'MEDIAL: 8.59
LV e' LATERAL: 10.6 cm/s
LVEEAVG: 8.28
LVEEMED: 8.28
LVOT area: 3.14 cm2
LVOT diameter: 20 mm
Lateral S' vel: 15.7 cm/s
MV Dec: 229
MVPG: 3 mmHg
MVPKAVEL: 74.6 m/s
MVPKEVEL: 87.8 m/s
PW: 11.5 mm — AB (ref 0.6–1.1)
RV TAPSE: 18.4 mm
WEIGHTICAEL: 2077.62 [oz_av]

## 2017-03-28 LAB — BASIC METABOLIC PANEL
ANION GAP: 9 (ref 5–15)
BUN: 14 mg/dL (ref 6–20)
CALCIUM: 8 mg/dL — AB (ref 8.9–10.3)
CO2: 23 mmol/L (ref 22–32)
Chloride: 106 mmol/L (ref 101–111)
Creatinine, Ser: 1.61 mg/dL — ABNORMAL HIGH (ref 0.61–1.24)
GFR, EST AFRICAN AMERICAN: 45 mL/min — AB (ref >60)
GFR, EST NON AFRICAN AMERICAN: 39 mL/min — AB (ref >60)
GLUCOSE: 110 mg/dL — AB (ref 65–99)
POTASSIUM: 3.8 mmol/L (ref 3.5–5.1)
Sodium: 138 mmol/L (ref 135–145)

## 2017-03-28 LAB — TROPONIN I: TROPONIN I: 0.06 ng/mL — AB (ref <0.03)

## 2017-03-28 MED ORDER — LEVOFLOXACIN 750 MG PO TABS: 750.0000 mg | ORAL_TABLET | ORAL | 0 refills | Status: AC

## 2017-03-28 MED ORDER — HYDROCODONE-ACETAMINOPHEN 5-325 MG PO TABS: 1.0000 | ORAL_TABLET | Freq: Once | ORAL | Status: DC

## 2017-03-28 MED ORDER — HYDROMORPHONE HCL 1 MG/ML IJ SOLN
0.5000 mg | Freq: Once | INTRAMUSCULAR | Status: AC
  Administered 2017-03-28: 0.5 mg via INTRAVENOUS
  Filled 2017-03-28: qty 0.5

## 2017-03-28 NOTE — Progress Notes (Signed)
  Date: 03/28/2017  Patient name: David Duke  Medical record number: [id number]  Date of birth: 10/30/1934   I have seen and evaluated David Duke and discussed their care with the Residency Team. In brief, patient is an 82 year old male with past medical history of CVA, BPH with chronic Foley catheter, dementia and anxiety who presented with altered mental status 1 day.  Patient was recently seen in the ED on 03/24/2017 for UTI and was discharged with Bactrim due to prior culture showing Enterobacter (03/20/2017) sensitive to Bactrim. Patient had another urine culture drawn on that day which grew enterococcus faecalis which was sensitive to ampicillin and vancomycin. Patient states that since his last ED visit he really hasn't been himself and became more confused on the day of admission. Patient also noted new onset blurry vision yesterday. No fevers or chills, no chest pain, no shortness of breath, no lightheadedness, no syncope, no focal weakness, no nausea or vomiting, no abdominal pain, no diarrhea, no palpitations, no diaphoresis.  Patient feels well today and would like to go home. He is awake alert and oriented 3 currently.  PMHx, Fam Hx, and/or Soc Hx : As per resident admit note  Vitals:   03/28/17 0500 03/28/17 1401  BP: 107/63 111/63  Pulse: 90 91  Resp: 17 (!) 22  Temp: 98.3 F (36.8 C) 98.3 F (36.8 C)   Gen.: Awake alert and oriented 3, NAD CVS: Regular rate and rhythm, normal heart sounds Lungs: CTA bilaterally Abdomen: Soft, nontender, nondistended, normoactive bowel sounds Extremities: No edema noted  Assessment and Plan: I have seen and evaluated the patient as outlined above. I agree with the formulated Assessment and Plan as detailed in the residents' note, with the following changes:   1. Complicated UTI: - Patient presented to the ED with altered mental status in the setting of chronic Foley and recurrent UTIs for which he was seen in the ED twice  over the last month. Patient initially had a mild leukocytosis which is now normalized and his mental status appears stable at baseline. His altered mental status is likely secondary to underlying urinary tract infection and this appears to be improving. - Continue vancomycin and ceftriaxone for now - We'll discuss with ID regarding transition to an oral agent today to complete a seven-day course - Patient had 2 urine cultures this month growing different organisms (E faecalis and Enterobacter). We'll follow-up urine culture drawn on this admission - We'll consider ampicillin or nitrofurantoin to cover enterococcus faecalis as an outpatient - Patient will need close follow-up with urology and PCP once discharged  Earl LagosNarendra, Meridee Branum, MD 7/21/20182:38 PM

## 2017-03-28 NOTE — Progress Notes (Signed)
18Fr. Coude catheter inserted without difficulty.  Immediate return of David Duke colored urine received.  Patient tolerated procedure well and with minimal complaint.    Kelli HopeBarber, Faraaz Wolin M

## 2017-03-28 NOTE — Progress Notes (Signed)
*  PRELIMINARY RESULTS* Echocardiogram 2D Echocardiogram has been performed.  Jeryl Columbialliott, Lygia Olaes 03/28/2017, 3:50 PM

## 2017-03-28 NOTE — Discharge Instructions (Signed)
Please follow-up with your primary care physician in 1 week.  Please call Dr. Ephriam Knucklesankin's office (opthalmology) as soon as possible to schedule a visit on 03/30/2017. You may also call [facility] if needed - [phone].  Please call Dr. Puschinsky's office (urology) to schedule a visit within 1 week.

## 2017-03-28 NOTE — Clinical Social Work Note (Signed)
CSW consult for taxi voucher. CSW reviewed pt's chart and met with pt's wife @ bedside to discuss transportation. CSW questioned why pt/wife haven't followed through with getting transportation resources through [facility], chart indicates [facility] has made several attempts to help the family with transportation needs. Wife reports that they have relied on friends and neighbors for transportation. CSW suggested contacting friends/neighbors for a ride home, wife reports that they are unable to reach anyone. Wife reports that she is unable to provide funds towards taxi ride to [place].  CSW advised wife that taxi ride would be a one time courtesy.  Pt to DC home today, no other identified DC needs at this time.  Icela Glymph B. Joline Maxcy Clinical Social Work Dept Weekend Social Worker [phone] 3:53 PM

## 2017-03-28 NOTE — Discharge Summary (Signed)
Name: David Duke MRN: [id number] DOB: 1934/11/16 82 y.o. PCP: Maye Hides, PA  Date of Admission: 03/27/2017 12:32 PM Date of Discharge: 03/28/2017 Attending Physician: Earl Lagos, MD  Discharge Diagnosis: 1. Complicated UTI 2. Wet Macular Degeneration  3. AKI on CKD 4. Elevated Troponin  5. Chronic Indwelling Urinary Catheter  6. Anxiety  7. History of CVA 8. CAD 9. COPD 10. BPH   Active Problems:   Elevated troponin   Diplopia   Bilateral leg edema  Discharge Medications: Allergies as of 03/28/2017      Reactions   Penicillins Hives, Diarrhea, Nausea And Vomiting   Has patient had a PCN reaction causing immediate rash, facial/tongue/throat swelling, SOB or lightheadedness with hypotension: Yes Has patient had a PCN reaction causing severe rash involving mucus membranes or skin necrosis: No Has patient had a PCN reaction that required hospitalization: No Has patient had a PCN reaction occurring within the last 10 years: No If all of the above answers are "NO", then may proceed with Cephalosporin use.   Amoxicillin Hives, Diarrhea, Nausea And Vomiting   Sulfa Antibiotics Nausea Only      Medication List    STOP taking these medications   ciprofloxacin 500 MG tablet Commonly known as:  CIPRO   furosemide 20 MG tablet Commonly known as:  LASIX   ondansetron 4 MG disintegrating tablet Commonly known as:  ZOFRAN-ODT   sulfamethoxazole-trimethoprim 800-160 MG tablet Commonly known as:  BACTRIM DS,SEPTRA DS     TAKE these medications   albuterol 108 (90 Base) MCG/ACT inhaler Commonly known as:  PROVENTIL HFA;VENTOLIN HFA Inhale 1-2 puffs into the lungs every 6 (six) hours as needed for wheezing or shortness of breath.   atorvastatin 20 MG tablet Commonly known as:  LIPITOR Take 1 tablet (20 mg total) by mouth daily at 6 PM.   diazepam 5 MG tablet Commonly known as:  VALIUM Take 1 tablet (5 mg total) by mouth every 8 (eight) hours as needed  for anxiety. Can fill no sooner than October 24, 2016. What changed:  when to take this  additional instructions   finasteride 5 MG tablet Commonly known as:  PROSCAR Take 5 mg by mouth daily.   fluticasone 50 MCG/ACT nasal spray Commonly known as:  FLONASE Place 1 spray into both nostrils daily. What changed:  when to take this  reasons to take this   levofloxacin 750 MG tablet Commonly known as:  LEVAQUIN Take 1 tablet (750 mg total) by mouth every other day.   multivitamin with minerals Tabs tablet Take 1 tablet by mouth daily.      Disposition and follow-up:   Mr.Arish R Voris was discharged from [facility] in Good condition.  At the hospital follow up visit please address:  1.  Tapering from diazepam and consider switching to an antidepressant for his ongoing feelings of sadness after his stroke as well as his anxiety.   2.  Labs / imaging needed at time of follow-up: None   3.  Pending labs/ test needing follow-up: None   Follow-up Appointments: Follow-up Information    Maye Hides, PA Follow up.   Specialty:  Physician Assistant Why:  Please call and make an appointment for a follow-up visit in 1 week.  Contact information: [address] [place] [address] [phone]        Luciana Axe Alford Highland, MD Follow up.   Specialty:  Ophthalmology Why:  Please call the office as soon  as possible to schedule a visit for 03/30/2017.  Contact information: [address] [place] [address] [phone]        Puschinsky, Adelfa Koh., MD Follow up.   Specialty:  General Surgery Why:  Please call and make an appointment for a follow-up visit within 1 week.  Contact information: [address] [place] [address] [phone]           Hospital Course by problem list: Active Problems:   Elevated troponin   Diplopia   Bilateral leg edema   1. Complicated UTI:  Patient was brought in by his wife for new  onset altered mental status and gait abnormalities. He had been recently seen in the ED for a UTI and was discharged with bactrim. He had grown Enterobacter on a prior admission, which was sensitive to bactrim; however at that admission he ended up growing Enterococcus faecalis which was sensitive to other drugs. Therefore when he presented to the hospital with AMS, a persistent UTI was highest on our differential. He had +nitrites and bacteria as well as a mild leukocytosis to 11.9 with neutrophilic predominance, and ongoing tachypnea. He did not have a significant leukocytosis or high fevers to explain AMS. To treat any persistent UTI, particularly in the context of his indwelling catheter, he was started on vancomycin and ceftriaxone in order to cover both E faecalis species and Enterobacter species. By the day after admission, his leukocytosis resolved and the patient endorsed feeling much better. We were not able to obtain urine cultures prior to initiation of antibiotics due to having difficulty finding personnel comfortable with replacing his Coude urinary catheter for a mid-stream sample. Therefore he was maintained on coverage for both possible organisms. We spoke with ID who recommended that he be changed to a fluoroquinolone for an oral home regimen. He was discharged with levaquin 750 mg PO q48hrs x 3 doses.  2. Wet Macular Degeneration: Upon presentation he was considered a code stroke due to wife reporting sudden onset facial droop. 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 Persistent facial droop or other focal neurologic deficits were not noted by the ED or during our neurological examination. However, the patient did complain of persistent diplopia which was very concerning to him as he had never experienced diplopia with his prior stroke. He had been regularly followed by ophthalmology who took care of macular  degeneration of his eyes for a long time, but patient endorses never having diplopia with his history of macular degeneration. We consulted with the on-call ophthalmologist who thought that the patient's presentation of monocular left-sided diplopia was very consisitent with wet macular degeneration. The ophthalmologist recommended that the patient follow up with them as an outpatient for further treatment of wet macular degeneration.    3. AKI on CKD: Patient presented with a Cr of 1.9 with a known baseline of around 1.5 with CKD III and a GFR of around 50. He endorsed a recent history of taking lasix for diuresis in the three days prior to discharge, and endorsed diuresing up to 5L on the day of admission prior to presenting to the ED. Additionally, he was discharged on bactrim at his previous ED visit three days ago, as noted above. The etiology of his AKI was determined to be likely overdiuresis from lasix and possibly some renal injury from bactrim. Dehydration was also a likely contributor to his AMS.  The patient's creatinine returned to baseline after a 1L bolus of normal saline and gentle IV fluids. Per wife report he had significant improvement in mentation by the day of discharge. Upon discharge we decided to hold his home lasix because of concern that lasix precipitated his AKI.   4. Elevated Troponin: The patient presented with a slight elevation of his troponin to 0.11 that then quickly downtrended to 0.09. This was determined likely a slight elevation in the setting of AKI on CKD. The patient was seen by cardiology who did not believe that he had a cardiac etiology of troponinemia. His serial EKGs did not show ST segment changes or new bundle branch blocks. He did undergo a repeat echocardiogram per cardiology recommendation which showed stable heart function when compared to a prior echo performed in March 2017 that showed normal LV function 65-70%.   5. Anxiety: The patient chronically took  valium 7.5 mg q6hr at home. During this admission he expressed significant anxiety with [place] about his ongoing medical problems and his feeling that he has deteriorated after his stroke. Spoke with patient about importance of switching to another medication class that is less sedating at his age and also more effective for anxiety. He was encouraged to follow up with his PCP, Laurence Aly, about other medications for his anxiety.   6. History of CVA: Head CT/MRI was negative as above. There was no concern for posterior circulation stroke per neurology. The patient did not have any persistent neuro deficits on exam. The patient does not take aspirin or any anticoagulation at home and was not started on either during this hospitalization.   7. CAD: He was maintained on his home lipitor 20 mg qd.    8. COPD: He did not need to use his home albuterol 2.5 mg q6h prn. He had no respiratory complaints.   9. BPH: He was maintained on his home finasteride 5 mg po qd.   Discharge Vitals:   BP 111/63 (BP Location: Left Arm)   Pulse 91   Temp 98.3 F (36.8 C) (Oral)   Resp (!) 22   Ht 5\' 8"  (1.727 m)   Wt 58.9 kg (129 lb 13.6 oz)   SpO2 95%   BMI 19.74 kg/m   Pertinent Labs, Studies, and Procedures:  BMP Latest Ref Rng & Units 03/28/2017 03/27/2017 03/24/2017  Glucose 65 - 99 mg/dL 086(V) 97 88  BUN 6 - 20 mg/dL 14 14 8   Creatinine 0.61 - 1.24 mg/dL 7.84(O) 9.62(X) 5.28(U)  Sodium 135 - 145 mmol/L 138 136 141  Potassium 3.5 - 5.1 mmol/L 3.8 4.2 3.9  Chloride 101 - 111 mmol/L 106 102 105  CO2 22 - 32 mmol/L 23 25 28   Calcium 8.9 - 10.3 mg/dL 8.0(L) 8.9 8.8(L)   CBC Latest Ref Rng & Units 03/28/2017 03/27/2017 03/24/2017  WBC 4.0 - 10.5 K/uL 9.8 11.9(H) 9.3  Hemoglobin 13.0 - 17.0 g/dL 11.8(L) 13.4 13.2  Hematocrit 39.0 - 52.0 % 36.1(L) 40.1 38.7(L)  Platelets 150 - 400 K/uL 178 184 220   Urinalysis    Component Value Date/Time   COLORURINE YELLOW 03/27/2017 1329   APPEARANCEUR CLEAR  03/27/2017 1329   LABSPEC 1.003 (L) 03/27/2017 1329   PHURINE 7.0 03/27/2017 1329   GLUCOSEU NEGATIVE 03/27/2017 1329   HGBUR SMALL (A) 03/27/2017 1329   BILIRUBINUR NEGATIVE 03/27/2017 1329   KETONESUR NEGATIVE 03/27/2017 1329   PROTEINUR NEGATIVE 03/27/2017 1329   UROBILINOGEN 0.2 03/31/2015 1850   NITRITE POSITIVE (A) 03/27/2017  1329   LEUKOCYTESUR NEGATIVE 03/27/2017 1329    Discharge Instructions:   Please follow-up with your primary care physician in 1 week.  Please call Dr. Ephriam Knucklesankin's office (opthalmology) as soon as possible to schedule a visit on 03/30/2017. You may also call [facility] if needed - [phone].  Please call Dr. Puschinsky's office (urology) to schedule a visit within 1 week.    Signed: Zeb ComfortSaripalli, Srinivas, Medical Student 03/28/2017, 4:30 PM   Pager: [phone]  Attestation for Student Documentation:  I personally was present and performed or re-performed the history, physical exam and medical decision-making activities of this service and have verified that the service and findings are accurately documented in the student's note.  [name], Fayola Meckes, MD 03/30/2017, 11:04 AM

## 2017-03-28 NOTE — Progress Notes (Signed)
Subjective:  Patient with marked expressive aphasia.  No complaints of chest pain or shortness of breath.  Objective:  Vital Signs in the last 24 hours: BP 107/63 (BP Location: Left Arm)   Pulse 90   Temp 98.3 F (36.8 C) (Oral)   Resp 17   Ht 5\' 8"  (1.727 m)   Wt 58.9 kg (129 lb 13.6 oz)   SpO2 100%   BMI 19.74 kg/m   Physical Exam: Elderly male alert with significant expressive aphasia Lungs:  Clear Cardiac:  Regular rhythm, normal S1 and S2, no S3, occasional irregular beats noted. Extremities:  No edema present  Intake/Output from previous day: 07/20 0701 - 07/21 0700 In: 1103.8 [P.O.:125; I.V.:728.8; IV Piggyback:250] Out: 1350 [Urine:1350]  Weight Filed Weights   03/27/17 1235 03/28/17 0500  Weight: 59 kg (130 lb) 58.9 kg (129 lb 13.6 oz)    Lab Results: Basic Metabolic Panel:  Recent Labs  16/06/9606/20/18 1315 03/28/17 0358  NA 136 138  K 4.2 3.8  CL 102 106  CO2 25 23  GLUCOSE 97 110*  BUN 14 14  CREATININE 1.99* 1.61*   CBC:  Recent Labs  03/27/17 1315 03/28/17 0358  WBC 11.9* 9.8  NEUTROABS 9.1* 6.6  HGB 13.4 11.8*  HCT 40.1 36.1*  MCV 91.1 91.4  PLT 184 178   Cardiac Enzymes: Troponin (Point of Care Test)  Recent Labs  03/27/17 1340  TROPIPOC 0.11*   Cardiac Panel (last 3 results)  Recent Labs  03/27/17 1604 03/27/17 2127 03/28/17 0358  TROPONINI 0.09* 0.08* 0.06*    Telemetry: Reviewed  Sinus rhythm with PACs  Assessment/Plan:  1.  Mild trivial elevation of troponin which is flattened with no evidence of an acute coronary syndrome or infarction and no clinical data to suggest as such.  Recommendations:  No need for further cardiovascular investigation at this time.  Please call if recurrent problems.  Continue to treat other nonmedical issues.      Darden PalmerW. Spencer Bobi Daudelin, Jr.  MD [facility]  03/28/2017, 9:19 AM

## 2017-03-28 NOTE — Progress Notes (Signed)
Original plan for evening was to remove old foley and get urine midstream for repeat cx and place new foley. RN removed old foley and patient did not urinate for a few hours. Paged on call provider and gave verbal order to place new foley. Two RNs attempted to place foley and were unsuccessful. Paged on call provider and gave verbal order to administer antibiotics without foley being placed and no urine specimen and wait for am to get foley placed. Few hours later patient c/o needing to urinate and not being able to. New orders to give 0.5mg  of dilaudid and insert a coude foley catheter.  Certified RN came to place the foley and was successful.

## 2017-03-28 NOTE — Evaluation (Signed)
Physical Therapy Evaluation Patient Details Name: David Duke MRN: [id number] DOB: [birth year] Today's Date: 03/28/2017   History of Present Illness  82 year old male with past medical history of CVA, BPH with chronic Foley catheter, dementia and anxiety who presented with altered mental status 1 day.Has wet macular degeneration, COPD, CAD.  Clinical Impression  Pt admitted with above diagnosis. Pt currently with functional limitations due to the deficits listed below (see PT Problem List). Pt was able to ambulate with min to min guard assist with some unsteady gait noted but wife states this is pt's baseline.  Pt somewhat anxious about going home today and MD trying to work on this. Wife feels comfortable taking care of pt and declines equipment stating she has been holding on to pt and assisting PTA.  Wife does agree to receive [facility] services as below.  Updated the MD.  Will follow acutely.  Pt will benefit from skilled PT to increase their independence and safety with mobility to allow discharge to the venue listed below.    Follow Up Recommendations Home health PT;Supervision/Assistance - 24 hour (HHOT, Aide and RN)    Equipment Recommendations  None recommended by PT    Recommendations for Other Services       Precautions / Restrictions Precautions Precautions: Fall Restrictions Weight Bearing Restrictions: No      Mobility  Bed Mobility Overal bed mobility: Independent             General bed mobility comments: Impulsive but independent to EOB  Transfers Overall transfer level: Needs assistance Equipment used: None Transfers: Sit to/from Stand Sit to Stand: Min guard         General transfer comment: min guard for safety only as pt moves impulsively and is unsteady generally. Wife states this is baseline  Ambulation/Gait Ambulation/Gait assistance: Min assist;Min guard Ambulation Distance (Feet): 225 Feet Assistive device: None Gait Pattern/deviations:  Step-through pattern;Decreased stride length;Trunk flexed;Wide base of support;Drifts right/left   Gait velocity interpretation: Below normal speed for age/gender General Gait Details: Pt ambulated in hallway with min guard to min assist at times as he gets unsteady and self corrects but is at fall risk. Wife states she stays with him all the time and guards him and this is pts baseline.    Stairs            Wheelchair Mobility    Modified Rankin (Stroke Patients Only)       Balance Overall balance assessment: Needs assistance Sitting-balance support: No upper extremity supported;Feet supported Sitting balance-Leahy Scale: Good     Standing balance support: No upper extremity supported;During functional activity Standing balance-Leahy Scale: Poor Standing balance comment: relies on single UE support for balance, cannot withstand challenges to balance.              High level balance activites: Direction changes;Sudden stops;Turns High Level Balance Comments: min to min guard assist for safety             Pertinent Vitals/Pain Pain Assessment: Faces Faces Pain Scale: Hurts even more Pain Location: penis where foley catheter was inserted  Pain Descriptors / Indicators: Aching;Grimacing;Guarding Pain Intervention(s): Limited activity within patient's tolerance;Monitored during session;Premedicated before session;Repositioned    Home Living Family/patient expects to be discharged to:: Private residence Living Arrangements: Spouse/significant other Available Help at Discharge: Family;Available 24 hours/day Type of Home: House Home Access: Stairs to enter Entrance Stairs-Rails: Right;Left;Can reach both Entrance Stairs-Number of Steps: 3 Home Layout: One level Home Equipment: Information systems manager  Prior Function Level of Independence: Independent               Hand Dominance   Dominant Hand: Right    Extremity/Trunk Assessment   Upper Extremity  Assessment Upper Extremity Assessment: Defer to OT evaluation    Lower Extremity Assessment Lower Extremity Assessment: Generalized weakness    Cervical / Trunk Assessment Cervical / Trunk Assessment: Kyphotic  Communication   Communication: No difficulties  Cognition Arousal/Alertness: Awake/alert Behavior During Therapy: Anxious;Restless Overall Cognitive Status: History of cognitive impairments - at baseline                                 General Comments: Alzheimers      General Comments General comments (skin integrity, edema, etc.): 91 bpm, 95% on RA, 28RR, 11/63    Exercises     Assessment/Plan    PT Assessment Patient needs continued PT services  PT Problem List Decreased activity tolerance;Decreased balance;Decreased strength;Decreased mobility;Decreased knowledge of use of DME;Decreased safety awareness;Decreased knowledge of precautions;Pain       PT Treatment Interventions DME instruction;Gait training;Functional mobility training;Therapeutic activities;Therapeutic exercise;Balance training;Patient/family education;Stair training    PT Goals (Current goals can be found in the Care Plan section)  Acute Rehab PT Goals Patient Stated Goal: to get better PT Goal Formulation: With patient Time For Goal Achievement: 04/11/17 Potential to Achieve Goals: Good    Frequency Min 3X/week   Barriers to discharge        Co-evaluation               AM-PAC PT "6 Clicks" Daily Activity  Outcome Measure Difficulty turning over in bed (including adjusting bedclothes, sheets and blankets)?: None Difficulty moving from lying on back to sitting on the side of the bed? : None Difficulty sitting down on and standing up from a chair with arms (e.g., wheelchair, bedside commode, etc,.)?: A Little Help needed moving to and from a bed to chair (including a wheelchair)?: A Little Help needed walking in hospital room?: A Little Help needed climbing 3-5  steps with a railing? : A Lot 6 Click Score: 19    End of Session Equipment Utilized During Treatment: Gait belt Activity Tolerance: Patient limited by fatigue;Patient limited by pain Patient left: in chair;with call bell/phone within reach;with chair alarm set;with family/visitor present Nurse Communication: Mobility status (nurse to get strap for catheter) PT Visit Diagnosis: Unsteadiness on feet (R26.81);Muscle weakness (generalized) (M62.81);Pain Pain - part of body:  (foley catheter site)    Time: [id number] PT Time Calculation (min) (ACUTE ONLY): 28 min   Charges:   PT Evaluation $PT Eval Moderate Complexity: 1 Procedure PT Treatments $Gait Training: 8-22 mins   PT G Codes:   PT G-Codes **NOT FOR INPATIENT CLASS** Functional Assessment Tool Used: AM-PAC 6 Clicks Basic Mobility Functional Limitation: Mobility: Walking and moving around Mobility: Walking and Moving Around Current Status ([id number](G8978): At least 20 percent but less than 40 percent impaired, limited or restricted Mobility: Walking and Moving Around Goal Status [phone](G8979): At least 1 percent but less than 20 percent impaired, limited or restricted    [facility] HospitalDawn Dinero Chavira,PT Acute Rehabilitation [phone])   Berline Lopesawn F Manasa Spease 03/28/2017, 3:59 PM

## 2017-03-28 NOTE — Progress Notes (Signed)
Subjective: Patient feels much better this morning. Looking forward to seeing his wife today. Spoke with him extensively about his plan for the next week. He understood and agreed.   Objective:  Vital signs in last 24 hours: Vitals:   03/27/17 1800 03/27/17 1815 03/27/17 2005 03/28/17 0500  BP: 107/63 112/85 (!) 108/55 107/63  Pulse: 93 93 (!) 101 90  Resp: (!) 25 (!) 26 (!) 24 17  Temp:   99.5 F (37.5 C) 98.3 F (36.8 C)  TempSrc:   Oral Oral  SpO2: 100% 100% 96% 100%  Weight:    58.9 kg (129 lb 13.6 oz)  Height:       Physical Exam  Constitutional: He appears well-developed. No distress.  HENT:  Head: Normocephalic and atraumatic.  Eyes: Pupils are equal, round, and reactive to light. EOM are normal.  Cardiovascular: Normal rate, regular rhythm and normal heart sounds.   No murmur heard. Pulmonary/Chest: Effort normal and breath sounds normal. No respiratory distress.  Abdominal: Soft. Bowel sounds are normal. He exhibits no distension. There is no tenderness.  Neurological: He is alert.  Skin: Skin is warm and dry.   Assessment/Plan:  Active Problems:   Elevated troponin   Diplopia   Bilateral leg edema  1. Complicated UTI: U/A with +nitrites and rare bacteria, repeat cx from this admission not obtained before starting antibiotics. On vanc/ceftriaxone. Had foley replaced overnight. WBC downtrended and patient appears less confused than prior. Clinically improving. Need to speak with ID regarding best oral agent for covering both E faecalis and Enterobacter spp, possibly cipro.  - Continue Vanc per pharm  - Continue Ceftriaxone per pharm - Will speak to ID about choice of oral agent  2. Wet Macular Degeneration: Spoke with ophthalmologist Dr. 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 Nothing to do inpatient per ophtho. - Patient to follow up with outpatient ophthalmology upon discharge, with either Dr. Luciana Axe or at the [facility]   3. Anxiety: Takes valium 7.5 mg q6hr at home. Spoke with patient yesterday and today about importance of switching to another medication class that is less sedating at his age and also more effective for anxiety.  - S/p ativan 1 mg in the ED  - Will continue at Valium 5 mg q8hr prn - Will encourage taper as outpatient   4. Elevated Troponins, resolved: Downtrended, cards has seen and signed off, NTD. Likely slight elevation in setting of AKI on CKD. No cardiac history. EKG not suggestive of ACS.   - F/u echo today - Stopped trending trops   AKI on CKD, resolved: Close to baseline of 1.6. Etiology was likely secondary to overdiuresis from lasix and possibly from bactrim and decreased PO intake.    - Will d/c IVF and encourage PO intake    History of CVA: Head CT/MRI negative as above. No concern for posterior circulation stroke per neurology. No persistent neuro deficits on exam. Does not take aspirin or any anticoagulation at home.   CAD: Continue home lipitor 20 mg qd.    COPD: Continue albuterol 2.5 mg q6h prn.   BPH: Continue home finasteride 5 mg po qd.   Diet: HH DVT ppx: Lovenox  Dispo: Anticipated discharge today.   David Duke, Medical Student 03/28/2017, 12:31 PM Pager: [phone]  Attestation for Student Documentation:  I personally was present and performed or re-performed the history, physical exam and medical decision-making activities of this service and have verified that the service  and findings are accurately documented in the student's note.  David Duke, David Skalla, MD 03/28/2017, 2:00 PM

## 2017-03-29 ENCOUNTER — Encounter (HOSPITAL_BASED_OUTPATIENT_CLINIC_OR_DEPARTMENT_OTHER): Payer: Self-pay | Admitting: Emergency Medicine

## 2017-03-29 ENCOUNTER — Emergency Department (HOSPITAL_BASED_OUTPATIENT_CLINIC_OR_DEPARTMENT_OTHER)
Admission: EM | Admit: 2017-03-29 | Discharge: 2017-03-29 | Disposition: A | Discharge status: Home / Self Care | Payer: Medicare Other | Attending: Emergency Medicine | Admitting: Emergency Medicine

## 2017-03-29 DIAGNOSIS — F17210 Nicotine dependence, cigarettes, uncomplicated: Secondary | ICD-10-CM | POA: Insufficient documentation

## 2017-03-29 DIAGNOSIS — Y829 Unspecified medical devices associated with adverse incidents: Secondary | ICD-10-CM | POA: Insufficient documentation

## 2017-03-29 DIAGNOSIS — J449 Chronic obstructive pulmonary disease, unspecified: Secondary | ICD-10-CM | POA: Insufficient documentation

## 2017-03-29 DIAGNOSIS — T83038A Leakage of other indwelling urethral catheter, initial encounter: Secondary | ICD-10-CM | POA: Diagnosis not present

## 2017-03-29 DIAGNOSIS — T839XXA Unspecified complication of genitourinary prosthetic device, implant and graft, initial encounter: Secondary | ICD-10-CM

## 2017-03-29 LAB — URINE CULTURE

## 2017-03-29 NOTE — ED Notes (Signed)
Pt helped  Into paper scrub pants.

## 2017-03-29 NOTE — ED Notes (Signed)
Pt has leg bags with him and requests assistance changing the foley bag to a leg bag. Leg bag applied for patient. Pt has no complaints at this time.

## 2017-03-29 NOTE — ED Provider Notes (Signed)
[facility] Provider Note   CSN: [id number] Arrival date & time: 03/29/17  1256     History   Chief Complaint No chief complaint on file.   HPI David Duke is a 82 y.o. male with history of BPH, dementia who presents with Foley catheter bag problem. Patient was recently discharged from the hospital with an indwelling Foley catheter for BPH and UTI. Patient's UTI has been treated and symptoms have resolved and he has had no pain or other symptoms, however he has had leaking from the urinary catheter bag. Patient denies any fevers, chest pain, shortness of breath, abdominal pain, nausea, vomiting.  HPI  Past Medical History:  Diagnosis Date  . AKI (acute kidney injury) (HCC)   . Anxiety   . Blind right eye   . BPH (benign prostatic hypertrophy) with urinary retention   . COPD (chronic obstructive pulmonary disease) (HCC)   . Dementia   . Depression   . High cholesterol   . Macular degeneration of both eyes   . Renal disorder   . Shortness of breath dyspnea   . Stroke [facility])    has residual slurred speech    Patient Active Problem List   Diagnosis Date Noted  . Elevated troponin 03/27/2017  . Diplopia 03/27/2017  . Bilateral leg edema 03/27/2017  . Enterocolitis 09/30/2016  . HLD (hyperlipidemia) 09/30/2016  . Tobacco abuse 09/30/2016  . Nausea vomiting and diarrhea 09/30/2016  . History of stroke in prior 3 months 01/22/2016  . Chronic tension-type headache, not intractable   . Acute frontal sinusitis   . 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 Gait disturbance, post-stroke   . Ataxia, late effect of cerebrovascular disease   . BPH (benign prostatic hyperplasia)   . Urinary retention   . Adjustment disorder with mixed anxiety and depressed mood   . Chronic obstructive pulmonary disease (HCC)   . Cephalalgia   . AKI (acute kidney injury) (HCC)   . Macular degeneration   . Prediabetes   . Hypokalemia   . Absolute anemia    . Aphasia   . Acute encephalopathy 11/25/2015  . Hyponatremia 11/25/2015  . Acute kidney injury (HCC) 11/25/2015  . Depression   . Anxiety   . COPD (chronic obstructive pulmonary disease) (HCC)   . ICH (intracerebral hemorrhage) (HCC) 11/22/2015    Past Surgical History:  Procedure Laterality Date  . NO PAST SURGERIES         Home Medications    Prior to Admission medications   Medication Sig Start Date End Date Taking? Authorizing Provider  albuterol (PROVENTIL HFA;VENTOLIN HFA) 108 (90 Base) MCG/ACT inhaler Inhale 1-2 puffs into the lungs every 6 (six) hours as needed for wheezing or shortness of breath. 12/28/16   Long, Arlyss Repress, MD  atorvastatin (LIPITOR) 20 MG tablet Take 1 tablet (20 mg total) by mouth daily at 6 PM. Patient not taking: Reported on 03/27/2017 09/03/16   Saguier, Ramon Dredge, PA-C  diazepam (VALIUM) 5 MG tablet Take 1 tablet (5 mg total) by mouth every 8 (eight) hours as needed for anxiety. Can fill no sooner than October 24, 2016. Patient taking differently: Take 5 mg by mouth 4 (four) times daily.  10/20/16   Saguier, Ramon Dredge, PA-C  finasteride (PROSCAR) 5 MG tablet Take 5 mg by mouth daily. 09/22/16   [provider]  fluticasone (FLONASE) 50 MCG/ACT nasal spray Place 1 spray into both nostrils daily. Patient taking differently: Place 1 spray into  both nostrils daily as needed for allergies or rhinitis.  12/11/15   Love, Evlyn KannerPamela S, PA-C  levofloxacin (LEVAQUIN) 750 MG tablet Take 1 tablet (750 mg total) by mouth every other day. 03/29/17 04/03/17  [name], Vasundhra, MD  Multiple Vitamin (MULTIVITAMIN WITH MINERALS) TABS tablet Take 1 tablet by mouth daily.    [provider]    Family History Family History  Problem Relation Age of Onset  . Colon cancer Mother   . Cerebral aneurysm Father     Social History Social History  Substance Use Topics  . Smoking status: Current Every Day Smoker    Packs/day: 0.25    Years: 60.00    Types:  Cigarettes  . Smokeless tobacco: Never Used  . Alcohol use No     Allergies   Penicillins; Amoxicillin; and Sulfa antibiotics   Review of Systems Review of Systems  Constitutional: Negative for fever.  Respiratory: Negative for shortness of breath.   Cardiovascular: Negative for chest pain.  Gastrointestinal: Negative for abdominal pain, nausea and vomiting.  Genitourinary: Negative for dysuria.     Physical Exam Updated Vital Signs BP 119/75 (BP Location: Right Arm)   Pulse 93   Temp 98.3 F (36.8 C) (Oral)   Resp 19   SpO2 97%   Physical Exam  Constitutional: He appears well-developed and well-nourished. No distress.  HENT:  Head: Normocephalic and atraumatic.  Mouth/Throat: Oropharynx is clear and moist. No oropharyngeal exudate.  Eyes: Pupils are equal, round, and reactive to light. Conjunctivae are normal. Right eye exhibits no discharge. Left eye exhibits no discharge. No scleral icterus.  Neck: Normal range of motion. Neck supple. No thyromegaly present.  Cardiovascular: Normal rate, regular rhythm, normal heart sounds and intact distal pulses.  Exam reveals no gallop and no friction rub.   No murmur heard. Pulmonary/Chest: Effort normal and breath sounds normal. No stridor. No respiratory distress. He has no wheezes. He has no rales.  Abdominal: Soft. Bowel sounds are normal. He exhibits no distension. There is no tenderness. There is no rebound and no guarding.  Genitourinary:  Genitourinary Comments: Foley catheter in place no leaking around the catheter, however urine leaking from the bag placed here today; a new, different bag was placed and leaking was resolved  Musculoskeletal: He exhibits no edema.  Lymphadenopathy:    He has no cervical adenopathy.  Neurological: He is alert. Coordination normal.  Skin: Skin is warm and dry. No rash noted. He is not diaphoretic. No pallor.  Psychiatric: He has a normal mood and affect.  Nursing note and vitals  reviewed.    ED Treatments / Results  Labs (all labs ordered are listed, but only abnormal results are displayed) Labs Reviewed - No data to display  EKG  EKG Interpretation None       Radiology No results found.  Procedures Procedures (including critical care time)  Medications Ordered in ED Medications - No data to display   Initial Impression / Assessment and Plan / ED Course  I have reviewed the triage vital signs and the nursing notes.  Pertinent labs & imaging results that were available during my care of the patient were reviewed by me and considered in my medical decision making (see chart for details).     Patient with urinary leakage from catheter bag. Bag was changed in the ED and problem was resolved. Physical exam unremarkable. Urine is clear without significant odor. Patient is afebrile. No other complaints. Patient safe to discharge home. Patient vitals  stable throughout ED course and discharged in satisfactory condition.  Final Clinical Impressions(s) / ED Diagnoses   Final diagnoses:  Problem with Foley catheter, initial encounter [facility])    New Prescriptions New Prescriptions   No medications on file     Verdis Prime 03/29/17 1658    Benjiman Core, MD 03/29/17 [phone]

## 2017-03-29 NOTE — ED Notes (Signed)
Assumed care of patient from [place], [place]. PT resting quietly. Awaiting eval from EDP. No distress. Concerned about catheter bag leakage.

## 2017-03-29 NOTE — ED Triage Notes (Signed)
Patient [facility] that [facility]. Patient states that he asked them to put on a leg bag and they did not. Patient reports that he is unable to switch it over to a leg bag and the family with him states that "it scares me to death" to change the bag

## 2017-04-03 ENCOUNTER — Emergency Department (HOSPITAL_BASED_OUTPATIENT_CLINIC_OR_DEPARTMENT_OTHER): Payer: Medicare Other

## 2017-04-03 ENCOUNTER — Emergency Department (HOSPITAL_BASED_OUTPATIENT_CLINIC_OR_DEPARTMENT_OTHER)
Admission: EM | Admit: 2017-04-03 | Discharge: 2017-04-04 | Disposition: A | Discharge status: Home / Self Care | Payer: Medicare Other | Attending: Emergency Medicine | Admitting: Emergency Medicine

## 2017-04-03 ENCOUNTER — Encounter (HOSPITAL_BASED_OUTPATIENT_CLINIC_OR_DEPARTMENT_OTHER): Payer: Self-pay

## 2017-04-03 DIAGNOSIS — Z8673 Personal history of transient ischemic attack (TIA), and cerebral infarction without residual deficits: Secondary | ICD-10-CM | POA: Diagnosis not present

## 2017-04-03 DIAGNOSIS — Z79899 Other long term (current) drug therapy: Secondary | ICD-10-CM | POA: Insufficient documentation

## 2017-04-03 DIAGNOSIS — J449 Chronic obstructive pulmonary disease, unspecified: Secondary | ICD-10-CM | POA: Diagnosis not present

## 2017-04-03 DIAGNOSIS — F17210 Nicotine dependence, cigarettes, uncomplicated: Secondary | ICD-10-CM | POA: Insufficient documentation

## 2017-04-03 DIAGNOSIS — F0390 Unspecified dementia without behavioral disturbance: Secondary | ICD-10-CM | POA: Insufficient documentation

## 2017-04-03 DIAGNOSIS — F410 Panic disorder [episodic paroxysmal anxiety] without agoraphobia: Secondary | ICD-10-CM

## 2017-04-03 DIAGNOSIS — N289 Disorder of kidney and ureter, unspecified: Secondary | ICD-10-CM | POA: Diagnosis not present

## 2017-04-03 DIAGNOSIS — F419 Anxiety disorder, unspecified: Secondary | ICD-10-CM | POA: Diagnosis not present

## 2017-04-03 DIAGNOSIS — R42 Dizziness and giddiness: Secondary | ICD-10-CM | POA: Diagnosis present

## 2017-04-03 LAB — CBC WITH DIFFERENTIAL/PLATELET
Basophils Absolute: 0.1 10*3/uL (ref 0.0–0.1)
Basophils Relative: 0 %
EOS PCT: 3 %
Eosinophils Absolute: 0.3 10*3/uL (ref 0.0–0.7)
HEMATOCRIT: 40.9 % (ref 39.0–52.0)
Hemoglobin: 13.8 g/dL (ref 13.0–17.0)
LYMPHS PCT: 18 %
Lymphs Abs: 2.1 10*3/uL (ref 0.7–4.0)
MCH: 30.7 pg (ref 26.0–34.0)
MCHC: 33.7 g/dL (ref 30.0–36.0)
MCV: 90.9 fL (ref 78.0–100.0)
MONO ABS: 0.8 10*3/uL (ref 0.1–1.0)
MONOS PCT: 7 %
NEUTROS ABS: 8.5 10*3/uL — AB (ref 1.7–7.7)
Neutrophils Relative %: 72 %
PLATELETS: 375 10*3/uL (ref 150–400)
RBC: 4.5 MIL/uL (ref 4.22–5.81)
RDW: 13.5 % (ref 11.5–15.5)
WBC: 11.8 10*3/uL — ABNORMAL HIGH (ref 4.0–10.5)

## 2017-04-03 NOTE — ED Provider Notes (Signed)
[facility] Provider Note   CSN: [id number] Arrival date & time: 04/03/17  2153     History   Chief Complaint Chief Complaint  Patient presents with  . Anxiety    HPI David BarlowDavid R Blanchett is a 82 y.o. male.  The history is provided by the patient and a relative. The history is limited by the condition of the patient (Dementia).  He had an episode this evening that was felt to be an anxiety attack. He felt shaky and his hands went numb and his mouth was dry. He felt lightheaded. He denied any circumoral numbness. Symptoms have improved, but he is still somewhat confused compared with his baseline. Caretaker says that confusion waxes and wanes, and current degree of confusion is within arranged that she has sometimes seen at home. He was recently admitted to the hospital with a urinary tract infection. He does have a chronic indwelling Foley catheter. There has been no fever, chills, sweats. He denies chest pain, dyspnea, nausea.  Past Medical History:  Diagnosis Date  . AKI (acute kidney injury) (HCC)   . Anxiety   . Blind right eye   . BPH (benign prostatic hypertrophy) with urinary retention   . COPD (chronic obstructive pulmonary disease) (HCC)   . Dementia   . Depression   . High cholesterol   . Macular degeneration of both eyes   . Renal disorder   . Shortness of breath dyspnea   . Stroke [facility](HCC)    has residual slurred speech    Patient Active Problem List   Diagnosis Date Noted  . Elevated troponin 03/27/2017  . Diplopia 03/27/2017  . Bilateral leg edema 03/27/2017  . Enterocolitis 09/30/2016  . HLD (hyperlipidemia) 09/30/2016  . Tobacco abuse 09/30/2016  . Nausea vomiting and diarrhea 09/30/2016  . History of stroke in prior 3 months 01/22/2016  . Chronic tension-type headache, not intractable   . Acute frontal sinusitis   . 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 Gait disturbance, post-stroke   . Ataxia, late effect of  cerebrovascular disease   . BPH (benign prostatic hyperplasia)   . Urinary retention   . Adjustment disorder with mixed anxiety and depressed mood   . Chronic obstructive pulmonary disease (HCC)   . Cephalalgia   . AKI (acute kidney injury) (HCC)   . Macular degeneration   . Prediabetes   . Hypokalemia   . Absolute anemia   . Aphasia   . Acute encephalopathy 11/25/2015  . Hyponatremia 11/25/2015  . Acute kidney injury (HCC) 11/25/2015  . Depression   . Anxiety   . COPD (chronic obstructive pulmonary disease) (HCC)   . ICH (intracerebral hemorrhage) (HCC) 11/22/2015    Past Surgical History:  Procedure Laterality Date  . NO PAST SURGERIES         Home Medications    Prior to Admission medications   Medication Sig Start Date End Date Taking? Authorizing Provider  albuterol (PROVENTIL HFA;VENTOLIN HFA) 108 (90 Base) MCG/ACT inhaler Inhale 1-2 puffs into the lungs every 6 (six) hours as needed for wheezing or shortness of breath. 12/28/16   Long, Arlyss RepressJoshua G, MD  atorvastatin (LIPITOR) 20 MG tablet Take 1 tablet (20 mg total) by mouth daily at 6 PM. Patient not taking: Reported on 03/27/2017 09/03/16   Saguier, Ramon DredgeEdward, PA-C  diazepam (VALIUM) 5 MG tablet Take 1 tablet (5 mg total) by mouth every 8 (eight) hours as needed for anxiety. Can fill no sooner than October 24, 2016. Patient taking differently: Take 5 mg by mouth 4 (four) times daily.  10/20/16   Saguier, Ramon DredgeEdward, PA-C  finasteride (PROSCAR) 5 MG tablet Take 5 mg by mouth daily. 09/22/16   [provider]  fluticasone (FLONASE) 50 MCG/ACT nasal spray Place 1 spray into both nostrils daily. Patient taking differently: Place 1 spray into both nostrils daily as needed for allergies or rhinitis.  12/11/15   Love, Evlyn KannerPamela S, PA-C  levofloxacin (LEVAQUIN) 750 MG tablet Take 1 tablet (750 mg total) by mouth every other day. 03/29/17 04/03/17  [name], Vasundhra, MD  Multiple Vitamin (MULTIVITAMIN WITH MINERALS) TABS tablet Take 1  tablet by mouth daily.    [provider]    Family History Family History  Problem Relation Age of Onset  . Colon cancer Mother   . Cerebral aneurysm Father     Social History Social History  Substance Use Topics  . Smoking status: Current Every Day Smoker    Packs/day: 0.25    Years: 60.00    Types: Cigarettes  . Smokeless tobacco: Never Used  . Alcohol use No     Allergies   Penicillins; Amoxicillin; and Sulfa antibiotics   Review of Systems Review of Systems  Unable to perform ROS: Dementia     Physical Exam Updated Vital Signs BP 134/87 (BP Location: Left Arm)   Pulse 90   Temp 98.8 F (37.1 C) (Oral)   Resp 18   SpO2 97%   Physical Exam  Nursing note and vitals reviewed.  82 year old male, resting comfortably and in no acute distress. Vital signs are normal. Oxygen saturation is 97%, which is normal. Head is normocephalic and atraumatic. PERRLA, EOMI. Oropharynx is clear. Neck is nontender and supple without adenopathy or JVD. Back is nontender and there is no CVA tenderness. Lungs are clear without rales, wheezes, or rhonchi. Chest is nontender. Heart has regular rate and rhythm without murmur. Abdomen is soft, flat, nontender without masses or hepatosplenomegaly and peristalsis is normoactive. Extremities have 2+ pretibial edema, full range of motion is present. Skin is warm and dry without rash. Neurologic: Awake, alert, oriented to person and place, cranial nerves are intact, there are no motor or sensory deficits. Moderate cogwheel rigidity present.  ED Treatments / Results  Labs (all labs ordered are listed, but only abnormal results are displayed) Labs Reviewed  URINALYSIS, ROUTINE W REFLEX MICROSCOPIC - Abnormal; Notable for the following:       Result Value   Leukocytes, UA MODERATE (*)    All other components within normal limits  COMPREHENSIVE METABOLIC PANEL - Abnormal; Notable for the following:    Glucose, Bld 107 (*)     Creatinine, Ser 1.44 (*)    GFR calc non Af Amer 44 (*)    GFR calc Af Amer 51 (*)    All other components within normal limits  CBC WITH DIFFERENTIAL/PLATELET - Abnormal; Notable for the following:    WBC 11.8 (*)    Neutro Abs 8.5 (*)    All other components within normal limits  URINALYSIS, MICROSCOPIC (REFLEX) - Abnormal; Notable for the following:    Bacteria, UA RARE (*)    Squamous Epithelial / LPF 0-5 (*)    All other components within normal limits  TROPONIN I   Radiology Dg Chest 2 View  Result Date: 04/04/2017 CLINICAL DATA:  Altered mental status.  Severe panic attack today. EXAM: CHEST  2 VIEW COMPARISON:  03/27/2017 FINDINGS: Emphysematous changes in the lungs.  Slight fibrosis with bronchial wall thickening suggesting chronic bronchitis. No airspace disease or consolidation. No blunting of costophrenic angles. No pneumothorax. Mediastinal contours appear intact. Calcification of the aorta. Degenerative changes in the spine. IMPRESSION: Emphysematous and chronic bronchitic changes in the chest. No evidence of active pulmonary disease. Aortic atherosclerosis. Electronically Signed   By: Burman Nieves M.D.   On: 04/04/2017 00:08    Procedures Procedures (including critical care time)  Medications Ordered in ED Medications - No data to display   Initial Impression / Assessment and Plan / ED Course  I have reviewed the triage vital signs and the nursing notes.  Pertinent labs & imaging results that were available during my care of the patient were reviewed by me and considered in my medical decision making (see chart for details).  Episode of what sounds like hyperventilation which has resolved. Mild persistent confusion, but within his normal range. Old records are reviewed, and he had been admitted from July 20 through July 21 with a complicated UTI, acute kidney injury, elevated troponin. Will check screening labs. It is noted that he is on a moderate dose of diazepam.  If having difficulty with confusion, should consider tapering off of diazepam and going to a non-benzodiazepine medication.  Laboratory workup shows mild renal insufficiency which is actually improved over her most recent value. Troponin was rechecked and was normal. Urinalysis is unremarkable. His mental status is been stable in the ED. He is discharged with injections to work with his PCP regarding coming off of benzodiazepines. Return precautions discussed.  Final Clinical Impressions(s) / ED Diagnoses   Final diagnoses:  Anxiety attack  Renal insufficiency    New Prescriptions New Prescriptions   No medications on file     Dione Booze, MD 04/04/17 0102

## 2017-04-03 NOTE — ED Notes (Signed)
Per EMS pt c/o anxiety, states feels hot and fast heart rate

## 2017-04-03 NOTE — ED Notes (Signed)
ED Provider at bedside. 

## 2017-04-04 LAB — URINALYSIS, ROUTINE W REFLEX MICROSCOPIC
BILIRUBIN URINE: NEGATIVE
GLUCOSE, UA: NEGATIVE mg/dL
Hgb urine dipstick: NEGATIVE
KETONES UR: NEGATIVE mg/dL
NITRITE: NEGATIVE
PROTEIN: NEGATIVE mg/dL
Specific Gravity, Urine: 1.007 (ref 1.005–1.030)
pH: 8 (ref 5.0–8.0)

## 2017-04-04 LAB — TROPONIN I: Troponin I: <0.03 ng/mL (ref <0.03)

## 2017-04-04 LAB — COMPREHENSIVE METABOLIC PANEL
ALT: 25 U/L (ref 17–63)
ANION GAP: 10 (ref 5–15)
AST: 24 U/L (ref 15–41)
Albumin: 3.6 g/dL (ref 3.5–5.0)
Alkaline Phosphatase: 77 U/L (ref 38–126)
BUN: 11 mg/dL (ref 6–20)
CALCIUM: 8.9 mg/dL (ref 8.9–10.3)
CO2: 27 mmol/L (ref 22–32)
Chloride: 101 mmol/L (ref 101–111)
Creatinine, Ser: 1.44 mg/dL — ABNORMAL HIGH (ref 0.61–1.24)
GFR, EST AFRICAN AMERICAN: 51 mL/min — AB (ref >60)
GFR, EST NON AFRICAN AMERICAN: 44 mL/min — AB (ref >60)
GLUCOSE: 107 mg/dL — AB (ref 65–99)
Potassium: 3.6 mmol/L (ref 3.5–5.1)
Sodium: 138 mmol/L (ref 135–145)
TOTAL PROTEIN: 7.4 g/dL (ref 6.5–8.1)
Total Bilirubin: 0.6 mg/dL (ref 0.3–1.2)

## 2017-04-04 LAB — URINALYSIS, MICROSCOPIC (REFLEX): RBC / HPF: NONE SEEN RBC/hpf (ref 0–5)

## 2017-04-04 NOTE — Discharge Instructions (Signed)
Talk with your doctor about discontinuing diazepam. There are other medications that can help with fewer side effects. However, do not stop taking it immediately - the dose has to be slowly decreased over several weeks.

## 2017-04-04 NOTE — ED Notes (Signed)
HOH, Alert, NAD, calm, interactive, resps e/u, speaking in clear complete sentences, no dyspnea noted, skin W&D, VSS, (denies: pain, sob, nausea, dizziness or visual changes). [facility]. Steady gait. Denies questions or needs.

## 2017-04-12 ENCOUNTER — Encounter (HOSPITAL_BASED_OUTPATIENT_CLINIC_OR_DEPARTMENT_OTHER): Payer: Self-pay

## 2017-04-12 DIAGNOSIS — F0390 Unspecified dementia without behavioral disturbance: Secondary | ICD-10-CM | POA: Diagnosis not present

## 2017-04-12 DIAGNOSIS — Z8673 Personal history of transient ischemic attack (TIA), and cerebral infarction without residual deficits: Secondary | ICD-10-CM | POA: Insufficient documentation

## 2017-04-12 DIAGNOSIS — N179 Acute kidney failure, unspecified: Secondary | ICD-10-CM | POA: Insufficient documentation

## 2017-04-12 DIAGNOSIS — F17210 Nicotine dependence, cigarettes, uncomplicated: Secondary | ICD-10-CM | POA: Diagnosis not present

## 2017-04-12 DIAGNOSIS — J449 Chronic obstructive pulmonary disease, unspecified: Secondary | ICD-10-CM | POA: Diagnosis not present

## 2017-04-12 DIAGNOSIS — F410 Panic disorder [episodic paroxysmal anxiety] without agoraphobia: Secondary | ICD-10-CM | POA: Insufficient documentation

## 2017-04-12 DIAGNOSIS — Z79899 Other long term (current) drug therapy: Secondary | ICD-10-CM | POA: Insufficient documentation

## 2017-04-12 DIAGNOSIS — F419 Anxiety disorder, unspecified: Secondary | ICD-10-CM | POA: Diagnosis present

## 2017-04-12 NOTE — ED Triage Notes (Signed)
Reports panic attack and meds at home are not working.  Patient has dementia and sundowners.  Patient shaky in triage.

## 2017-04-13 ENCOUNTER — Emergency Department (HOSPITAL_BASED_OUTPATIENT_CLINIC_OR_DEPARTMENT_OTHER)
Admission: EM | Admit: 2017-04-13 | Discharge: 2017-04-13 | Disposition: A | Discharge status: Home / Self Care | Payer: Medicare Other | Attending: Emergency Medicine | Admitting: Emergency Medicine

## 2017-04-13 DIAGNOSIS — F410 Panic disorder [episodic paroxysmal anxiety] without agoraphobia: Secondary | ICD-10-CM

## 2017-04-13 MED ORDER — LORAZEPAM 1 MG PO TABS: 1.0000 mg | ORAL_TABLET | Freq: Three times a day (TID) | ORAL | 0 refills | Status: DC | PRN

## 2017-04-13 NOTE — Discharge Instructions (Signed)
I am concerned that diazepam is not an appropriate medication for you now. Please make an appointment with either the psychiatrist, or the geriatric specialist to see about alternative medications to help with your anxiety. Be aware, diazepam cannot be stopped abruptly, but needs to be slowly tapered off over several weeks.

## 2017-04-13 NOTE — ED Provider Notes (Signed)
[facility] Provider Note   CSN: [id number] Arrival date & time: 04/12/17  2112     History   Chief Complaint Chief Complaint  Patient presents with  . Anxiety    HPI David Duke is a 82 y.o. male.  The history is provided by the patient and a relative. The history is limited by the condition of the patient (Dementia).  Anxiety   He has a history of dementia and anxiety attacks. This evening, he developed a hot feeling in his head, and a sense that his heart was racing. His daughter called 911, and checked his heart rate, and it was normal. He feels much better now. He is currently taking diazepam 5 mg 4 times a day for anxiety, and it does not seem to be helping as much as it did originally.  Past Medical History:  Diagnosis Date  . AKI (acute kidney injury) (HCC)   . Anxiety   . Blind right eye   . BPH (benign prostatic hypertrophy) with urinary retention   . COPD (chronic obstructive pulmonary disease) (HCC)   . Dementia   . Depression   . High cholesterol   . Macular degeneration of both eyes   . Renal disorder   . Shortness of breath dyspnea   . Stroke [facility])    has residual slurred speech    Patient Active Problem List   Diagnosis Date Noted  . Elevated troponin 03/27/2017  . Diplopia 03/27/2017  . Bilateral leg edema 03/27/2017  . Enterocolitis 09/30/2016  . HLD (hyperlipidemia) 09/30/2016  . Tobacco abuse 09/30/2016  . Nausea vomiting and diarrhea 09/30/2016  . History of stroke in prior 3 months 01/22/2016  . Chronic tension-type headache, not intractable   . Acute frontal sinusitis   . 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 Gait disturbance, post-stroke   . Ataxia, late effect of cerebrovascular disease   . BPH (benign prostatic hyperplasia)   . Urinary retention   . Adjustment disorder with mixed anxiety and depressed mood   . Chronic obstructive pulmonary disease (HCC)   . Cephalalgia   . AKI (acute  kidney injury) (HCC)   . Macular degeneration   . Prediabetes   . Hypokalemia   . Absolute anemia   . Aphasia   . Acute encephalopathy 11/25/2015  . Hyponatremia 11/25/2015  . Acute kidney injury (HCC) 11/25/2015  . Depression   . Anxiety   . COPD (chronic obstructive pulmonary disease) (HCC)   . ICH (intracerebral hemorrhage) (HCC) 11/22/2015    Past Surgical History:  Procedure Laterality Date  . NO PAST SURGERIES         Home Medications    Prior to Admission medications   Medication Sig Start Date End Date Taking? Authorizing Provider  levofloxacin (LEVAQUIN) 500 MG tablet Take 500 mg by mouth daily.   Yes [provider]  albuterol (PROVENTIL HFA;VENTOLIN HFA) 108 (90 Base) MCG/ACT inhaler Inhale 1-2 puffs into the lungs every 6 (six) hours as needed for wheezing or shortness of breath. 12/28/16   Long, Arlyss Repress, MD  atorvastatin (LIPITOR) 20 MG tablet Take 1 tablet (20 mg total) by mouth daily at 6 PM. Patient not taking: Reported on 03/27/2017 09/03/16   Saguier, Ramon Dredge, PA-C  diazepam (VALIUM) 5 MG tablet Take 1 tablet (5 mg total) by mouth every 8 (eight) hours as needed for anxiety. Can fill no sooner than October 24, 2016. Patient taking differently: Take 5 mg by mouth  4 (four) times daily.  10/20/16   Saguier, Ramon DredgeEdward, PA-C  finasteride (PROSCAR) 5 MG tablet Take 5 mg by mouth daily. 09/22/16   [provider]  fluticasone (FLONASE) 50 MCG/ACT nasal spray Place 1 spray into both nostrils daily. Patient taking differently: Place 1 spray into both nostrils daily as needed for allergies or rhinitis.  12/11/15   Love, Evlyn KannerPamela S, PA-C  Multiple Vitamin (MULTIVITAMIN WITH MINERALS) TABS tablet Take 1 tablet by mouth daily.    [provider]    Family History Family History  Problem Relation Age of Onset  . Colon cancer Mother   . Cerebral aneurysm Father     Social History Social History  Substance Use Topics  . Smoking status: Current Every  Day Smoker    Packs/day: 0.25    Years: 60.00    Types: Cigarettes  . Smokeless tobacco: Never Used  . Alcohol use No     Allergies   Penicillins; Amoxicillin; and Sulfa antibiotics   Review of Systems Review of Systems  Unable to perform ROS: Dementia     Physical Exam Updated Vital Signs BP 131/73 (BP Location: Right Arm)   Pulse 74   Temp 98.5 F (36.9 C) (Oral)   Resp (!) 26   SpO2 97%   Physical Exam  Nursing note and vitals reviewed.  82 year old male, resting comfortably and in no acute distress. Vital signs are significant for tachypnea. Oxygen saturation is 97%, which is normal. Head is normocephalic and atraumatic. PERRLA, EOMI. Oropharynx is clear. Neck is nontender and supple without adenopathy or JVD. Back is nontender and there is no CVA tenderness. Lungs are clear without rales, wheezes, or rhonchi. Chest is nontender. Heart has regular rate and rhythm without murmur. Abdomen is soft, flat, nontender without masses or hepatosplenomegaly and peristalsis is normoactive. Extremities have no cyanosis or edema, full range of motion is present. Skin is warm and dry without rash. Neurologic: He is oriented to person and place but not time, cranial nerves are intact, there are no motor or sensory deficits.  ED Treatments / Results   EKG  EKG Interpretation  Date/Time:  Sunday April 12 2017 21:31:37 EDT Ventricular Rate:  85 PR Interval:  150 QRS Duration: 96 QT Interval:  392 QTC Calculation: 466 R Axis:   -73 Text Interpretation:  Normal sinus rhythm Pulmonary disease pattern Incomplete right bundle branch block Left anterior fascicular block Cannot rule out Inferior infarct , age undetermined Abnormal ECG Artifact Otherwise no significant change Confirmed by Drema Pryardama, Pedro [phone]([id number]) on 04/12/2017 9:39:07 PM        Procedures Procedures (including critical care time)  Medications Ordered in ED Medications - No data to display   Initial  Impression / Assessment and Plan / ED Course  I have reviewed the triage vital signs and the nursing notes.  Anxiety attack which has resolved. Old records are reviewed, and he had been seen in the emergency department 9 days ago with similar symptoms. I am concerned that diazepam is not an appropriate anti-exciting medication for this patient. There is no need for medication for the episode which precipitated the emergency department visit, since it has resolved. He is given a prescription for lorazepam to use at home to try to avoid additional ED visits. I've discussed with family member possibility of psychiatry evaluation and evaluation by geriatric specialist to try to find more appropriate antianxiety medication. Need for slow taper of benzodiazepine was stressed. [facility], and to [facility].  Final Clinical Impressions(s) / ED Diagnoses   Final diagnoses:  Panic attack    New Prescriptions New Prescriptions   LORAZEPAM (ATIVAN) 1 MG TABLET    Take 1 tablet (1 mg total) by mouth 3 (three) times daily as needed for anxiety.     Dione Booze, MD 04/13/17 [phone]

## 2017-05-15 ENCOUNTER — Encounter (HOSPITAL_BASED_OUTPATIENT_CLINIC_OR_DEPARTMENT_OTHER): Payer: Self-pay | Admitting: *Deleted

## 2017-05-15 ENCOUNTER — Emergency Department (HOSPITAL_BASED_OUTPATIENT_CLINIC_OR_DEPARTMENT_OTHER): Payer: Medicare Other

## 2017-05-15 ENCOUNTER — Emergency Department (HOSPITAL_BASED_OUTPATIENT_CLINIC_OR_DEPARTMENT_OTHER)
Admission: EM | Admit: 2017-05-15 | Discharge: 2017-05-15 | Disposition: A | Discharge status: Home / Self Care | Payer: Medicare Other | Attending: Emergency Medicine | Admitting: Emergency Medicine

## 2017-05-15 DIAGNOSIS — R7303 Prediabetes: Secondary | ICD-10-CM | POA: Diagnosis not present

## 2017-05-15 DIAGNOSIS — J449 Chronic obstructive pulmonary disease, unspecified: Secondary | ICD-10-CM | POA: Diagnosis not present

## 2017-05-15 DIAGNOSIS — F17210 Nicotine dependence, cigarettes, uncomplicated: Secondary | ICD-10-CM | POA: Diagnosis not present

## 2017-05-15 DIAGNOSIS — Z79899 Other long term (current) drug therapy: Secondary | ICD-10-CM | POA: Insufficient documentation

## 2017-05-15 DIAGNOSIS — R51 Headache: Secondary | ICD-10-CM | POA: Diagnosis not present

## 2017-05-15 DIAGNOSIS — Z8673 Personal history of transient ischemic attack (TIA), and cerebral infarction without residual deficits: Secondary | ICD-10-CM | POA: Diagnosis not present

## 2017-05-15 DIAGNOSIS — R519 Headache, unspecified: Secondary | ICD-10-CM

## 2017-05-15 NOTE — ED Notes (Signed)
Pt in CT.

## 2017-05-15 NOTE — ED Provider Notes (Signed)
[facility]   CSN: [id number] Arrival date & time: 05/15/17  1626     History   Chief Complaint Chief Complaint  Patient presents with  . Headache    HPI David Duke is a 82 y.o. male.  HPI Patient history of hemorrhagic stroke on the left side when year ago presents with gradual onset left-sided headache. States the headache started this afternoon and started left-sided his neck and radiated around to the left temporal area. He states the headache completely resolved one hour ago. He's had no new weakness or numbness. He has ongoing visual difficulties and the left I but no new changes since onset of headache. Patient does have a history of dementia and confusion. Per daughter he is at his baseline. No voice changes. Past Medical History:  Diagnosis Date  . AKI (acute kidney injury) (HCC)   . Anxiety   . Blind right eye   . BPH (benign prostatic hypertrophy) with urinary retention   . COPD (chronic obstructive pulmonary disease) (HCC)   . Dementia   . Depression   . High cholesterol   . Macular degeneration of both eyes   . Renal disorder   . Shortness of breath dyspnea   . Stroke [facility](HCC)    has residual slurred speech    Patient Active Problem List   Diagnosis Date Noted  . Elevated troponin 03/27/2017  . Diplopia 03/27/2017  . Bilateral leg edema 03/27/2017  . Enterocolitis 09/30/2016  . HLD (hyperlipidemia) 09/30/2016  . Tobacco abuse 09/30/2016  . Nausea vomiting and diarrhea 09/30/2016  . History of stroke in prior 3 months 01/22/2016  . Chronic tension-type headache, not intractable   . Acute frontal sinusitis   . 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 Gait disturbance, post-stroke   . Ataxia, late effect of cerebrovascular disease   . BPH (benign prostatic hyperplasia)   . Urinary retention   . Adjustment disorder with mixed anxiety and depressed mood   . Chronic obstructive pulmonary disease (HCC)    . Cephalalgia   . AKI (acute kidney injury) (HCC)   . Macular degeneration   . Prediabetes   . Hypokalemia   . Absolute anemia   . Aphasia   . Acute encephalopathy 11/25/2015  . Hyponatremia 11/25/2015  . Acute kidney injury (HCC) 11/25/2015  . Depression   . Anxiety   . COPD (chronic obstructive pulmonary disease) (HCC)   . ICH (intracerebral hemorrhage) (HCC) 11/22/2015    Past Surgical History:  Procedure Laterality Date  . NO PAST SURGERIES         Home Medications    Prior to Admission medications   Medication Sig Start Date End Date Taking? Authorizing Provider  albuterol (PROVENTIL HFA;VENTOLIN HFA) 108 (90 Base) MCG/ACT inhaler Inhale 1-2 puffs into the lungs every 6 (six) hours as needed for wheezing or shortness of breath. 12/28/16   Long, Arlyss RepressJoshua G, MD  atorvastatin (LIPITOR) 20 MG tablet Take 1 tablet (20 mg total) by mouth daily at 6 PM. Patient not taking: Reported on 03/27/2017 09/03/16   Saguier, Ramon DredgeEdward, PA-C  diazepam (VALIUM) 5 MG tablet Take 1 tablet (5 mg total) by mouth every 8 (eight) hours as needed for anxiety. Can fill no sooner than October 24, 2016. Patient taking differently: Take 5 mg by mouth 4 (four) times daily.  10/20/16   Saguier, Ramon DredgeEdward, PA-C  finasteride (PROSCAR) 5 MG tablet Take 5 mg by mouth daily. 09/22/16  [provider]  fluticasone (FLONASE) 50 MCG/ACT nasal spray Place 1 spray into both nostrils daily. Patient taking differently: Place 1 spray into both nostrils daily as needed for allergies or rhinitis.  12/11/15   Love, [name], PA-C  LORazepam (ATIVAN) 1 MG tablet Take 1 tablet (1 mg total) by mouth 3 (three) times daily as needed for anxiety. 04/13/17   Dione Booze, MD  Multiple Vitamin (MULTIVITAMIN WITH MINERALS) TABS tablet Take 1 tablet by mouth daily.    [provider]    Family History Family History  Problem Relation Age of Onset  . Colon cancer Mother   . Cerebral aneurysm Father     Social  History Social History  Substance Use Topics  . Smoking status: Current Every Day Smoker    Packs/day: 0.25    Years: 60.00    Types: Cigarettes  . Smokeless tobacco: Never Used  . Alcohol use No     Allergies   Penicillins; Amoxicillin; and Sulfa antibiotics   Review of Systems Review of Systems  Constitutional: Negative for chills and fever.  HENT: Negative for congestion, sinus pressure and sore throat.   Eyes: Positive for visual disturbance. Negative for photophobia.  Respiratory: Negative for cough, chest tightness and shortness of breath.   Cardiovascular: Negative for chest pain, palpitations and leg swelling.  Gastrointestinal: Negative for abdominal pain, diarrhea, nausea and vomiting.  Genitourinary: Negative for dysuria and flank pain.  Musculoskeletal: Positive for myalgias and neck pain. Negative for back pain and neck stiffness.  Skin: Negative for rash and wound.  Neurological: Positive for headaches. Negative for dizziness, syncope, weakness, light-headedness and numbness.  Psychiatric/Behavioral: Positive for confusion. The patient is nervous/anxious.   All other systems reviewed and are negative.    Physical Exam Updated Vital Signs BP (!) 142/97 (BP Location: Right Arm)   Pulse 87   Temp 98.3 F (36.8 C) (Oral)   Resp 16   Ht  (1.727 m)   Wt 63.6 kg (140 lb 5 oz)   SpO2 97%   BMI 21.33 kg/m   Physical Exam  Constitutional: He is oriented to person, place, and time. He appears well-developed and well-nourished. No distress.  HENT:  Head: Normocephalic and atraumatic.  Mouth/Throat: Oropharynx is clear and moist. No oropharyngeal exudate.  No temporal artery tenderness to palpation.  Eyes: Pupils are equal, round, and reactive to light. EOM are normal.  Difficult to fully visualize the retina. No obvious abnormality.  Neck: Normal range of motion. Neck supple.  No meningismus. No tenderness to palpation.  Cardiovascular: Normal rate and  regular rhythm.  Exam reveals no gallop and no friction rub.   No murmur heard. Pulmonary/Chest: Effort normal and breath sounds normal. No respiratory distress. He has no wheezes. He has no rales. He exhibits no tenderness.  Abdominal: Soft. Bowel sounds are normal. There is no tenderness. There is no rebound and no guarding.  Musculoskeletal: Normal range of motion. He exhibits no edema or tenderness.  Neurological: He is alert and oriented to person, place, and time.  Patient is alert and oriented x3 with clear, goal oriented speech. Patient has 5/5 motor in all extremities. Sensation is intact to light touch. Cranial nerves II through XII grossly intact.  Skin: Skin is warm and dry. Capillary refill takes less than 2 seconds. No rash noted. No erythema.  Psychiatric:  Anxious appearing  Nursing note and vitals reviewed.    ED Treatments / Results  Labs (all labs ordered are  listed, but only abnormal results are displayed) Labs Reviewed - No data to display  EKG  EKG Interpretation None       Radiology Ct Head Wo Contrast  Result Date: 05/15/2017 CLINICAL DATA:  Left-sided headache and visual disturbance. Previous stroke. EXAM: CT HEAD WITHOUT CONTRAST TECHNIQUE: Contiguous axial images were obtained from the base of the skull through the vertex without intravenous contrast. COMPARISON:  Brain MR dated 03/27/2017 and head CT dated 03/27/2017. FINDINGS: Brain: Diffusely enlarged ventricles and subarachnoid spaces. Patchy white matter low density in both cerebral hemispheres. Stable left occipital lobe encephalomalacia. No intracranial hemorrhage, mass lesion or CT evidence of acute infarction. Vascular: No hyperdense vessel or unexpected calcification. Skull: Normal. Negative for fracture or focal lesion. Sinuses/Orbits: Status post left cataract extraction. Unremarkable included paranasal sinuses. Other: None. IMPRESSION: 1. No acute abnormality. 2. Stable atrophy, chronic small vessel  white matter ischemic changes and left occipital lobe encephalomalacia. Electronically Signed   By: Beckie Salts M.D.   On: 05/15/2017 19:05    Procedures Procedures (including critical care time)  Medications Ordered in ED Medications - No data to display   Initial Impression / Assessment and Plan / ED Course  I have reviewed the triage vital signs and the nursing notes.  Pertinent labs & imaging results that were available during my care of the patient were reviewed by me and considered in my medical decision making (see chart for details).    CT head without acute findings. Baseline mental status and neurologic exam. Advised to follow-up with his ophthalmologist and neurologist as needed. Given outpatient resources for adult counseling for anxiety.   Final Clinical Impressions(s) / ED Diagnoses   Final diagnoses:  Nonintractable headache, unspecified chronicity pattern, unspecified headache type    New Prescriptions New Prescriptions   No medications on file     Loren Racer, MD 05/15/17 [phone]

## 2017-05-15 NOTE — ED Triage Notes (Signed)
Pain in his head and neck for an hour. Hx of dementia.

## 2017-05-23 ENCOUNTER — Emergency Department (HOSPITAL_BASED_OUTPATIENT_CLINIC_OR_DEPARTMENT_OTHER)
Admission: EM | Admit: 2017-05-23 | Discharge: 2017-05-24 | Disposition: A | Discharge status: Home / Self Care | Payer: Medicare Other | Attending: Emergency Medicine | Admitting: Emergency Medicine

## 2017-05-23 ENCOUNTER — Encounter (HOSPITAL_BASED_OUTPATIENT_CLINIC_OR_DEPARTMENT_OTHER): Payer: Self-pay | Admitting: Emergency Medicine

## 2017-05-23 DIAGNOSIS — Z79899 Other long term (current) drug therapy: Secondary | ICD-10-CM | POA: Insufficient documentation

## 2017-05-23 DIAGNOSIS — N390 Urinary tract infection, site not specified: Secondary | ICD-10-CM | POA: Diagnosis not present

## 2017-05-23 DIAGNOSIS — F17210 Nicotine dependence, cigarettes, uncomplicated: Secondary | ICD-10-CM | POA: Insufficient documentation

## 2017-05-23 DIAGNOSIS — J449 Chronic obstructive pulmonary disease, unspecified: Secondary | ICD-10-CM | POA: Insufficient documentation

## 2017-05-23 DIAGNOSIS — I69119 Unspecified symptoms and signs involving cognitive functions following nontraumatic intracerebral hemorrhage: Secondary | ICD-10-CM | POA: Insufficient documentation

## 2017-05-23 DIAGNOSIS — I69193 Ataxia following nontraumatic intracerebral hemorrhage: Secondary | ICD-10-CM | POA: Insufficient documentation

## 2017-05-23 DIAGNOSIS — R109 Unspecified abdominal pain: Secondary | ICD-10-CM | POA: Diagnosis present

## 2017-05-23 LAB — URINALYSIS, ROUTINE W REFLEX MICROSCOPIC
Bilirubin Urine: NEGATIVE
GLUCOSE, UA: NEGATIVE mg/dL
Ketones, ur: NEGATIVE mg/dL
Nitrite: POSITIVE — AB
PROTEIN: NEGATIVE mg/dL
SPECIFIC GRAVITY, URINE: 1.01 (ref 1.005–1.030)
pH: 7 (ref 5.0–8.0)

## 2017-05-23 LAB — URINALYSIS, MICROSCOPIC (REFLEX)

## 2017-05-23 MED ORDER — LIDOCAINE HCL 2 % EX GEL
1.0000 "application " | Freq: Once | CUTANEOUS | Status: AC
  Administered 2017-05-23: 1 via URETHRAL
  Filled 2017-05-23: qty 20

## 2017-05-23 NOTE — ED Notes (Signed)
Attempted to inset 18FR catheter. Upon insertion catheter would not advance and was met with resistance. Catheter was then pulled out once it would not advance further.

## 2017-05-23 NOTE — ED Notes (Signed)
Alert, NAD, calm, interactive, resps e/u, speaking in clear complete sentences, no dyspnea noted, skin W&D, VSS, pt anxious, easily distracted and redirected, wife at [facility], (denies: fever, pain, sob, NVD, dizziness or visual changes).

## 2017-05-23 NOTE — ED Notes (Signed)
EDP into room, prior to RN assessment, see MD notes, pending orders.   

## 2017-05-23 NOTE — ED Provider Notes (Signed)
[facility] Provider Note   CSN: [id number] Arrival date & time: 05/23/17  2046     History   Chief Complaint Chief Complaint  Patient presents with  . Abdominal Pain    HPI David Duke is a 82 y.o. male.  HPI   82 year old male with a history of COPD, dementia, BPH, hypercholesterolemia, CVA, chronic indwelling Foley presents with concern for suprapubic pressure, concern for urine leaking around the Foley catheter. Reports the suprapubic pain began today. Denies any flank pain, vomiting, fevers. Reports he felt like he needed to urinate, and attempted to do so with urine going around the Foley catheter. Wife reports that his catheter was last placed 3 months ago. Denies fevers. Reports even doing well until today.  Past Medical History:  Diagnosis Date  . AKI (acute kidney injury) (HCC)   . Anxiety   . Blind right eye   . BPH (benign prostatic hypertrophy) with urinary retention   . COPD (chronic obstructive pulmonary disease) (HCC)   . Dementia   . Depression   . High cholesterol   . Macular degeneration of both eyes   . Renal disorder   . Shortness of breath dyspnea   . Stroke [facility])    has residual slurred speech    Patient Active Problem List   Diagnosis Date Noted  . Elevated troponin 03/27/2017  . Diplopia 03/27/2017  . Bilateral leg edema 03/27/2017  . Enterocolitis 09/30/2016  . HLD (hyperlipidemia) 09/30/2016  . Tobacco abuse 09/30/2016  . Nausea vomiting and diarrhea 09/30/2016  . History of stroke in prior 3 months 01/22/2016  . Chronic tension-type headache, not intractable   . Acute frontal sinusitis   . 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 Gait disturbance, post-stroke   . Ataxia, late effect of cerebrovascular disease   . BPH (benign prostatic hyperplasia)   . Urinary retention   . Adjustment disorder with mixed anxiety and depressed mood   . Chronic obstructive pulmonary disease (HCC)   .  Cephalalgia   . AKI (acute kidney injury) (HCC)   . Macular degeneration   . Prediabetes   . Hypokalemia   . Absolute anemia   . Aphasia   . Acute encephalopathy 11/25/2015  . Hyponatremia 11/25/2015  . Acute kidney injury (HCC) 11/25/2015  . Depression   . Anxiety   . COPD (chronic obstructive pulmonary disease) (HCC)   . ICH (intracerebral hemorrhage) (HCC) 11/22/2015    Past Surgical History:  Procedure Laterality Date  . NO PAST SURGERIES         Home Medications    Prior to Admission medications   Medication Sig Start Date End Date Taking? Authorizing Provider  albuterol (PROVENTIL HFA;VENTOLIN HFA) 108 (90 Base) MCG/ACT inhaler Inhale 1-2 puffs into the lungs every 6 (six) hours as needed for wheezing or shortness of breath. 12/28/16   Long, Arlyss Repress, MD  atorvastatin (LIPITOR) 20 MG tablet Take 1 tablet (20 mg total) by mouth daily at 6 PM. Patient not taking: Reported on 03/27/2017 09/03/16   Saguier, Ramon Dredge, PA-C  diazepam (VALIUM) 5 MG tablet Take 1 tablet (5 mg total) by mouth every 8 (eight) hours as needed for anxiety. Can fill no sooner than October 24, 2016. Patient taking differently: Take 5 mg by mouth 4 (four) times daily.  10/20/16   Saguier, Ramon Dredge, PA-C  finasteride (PROSCAR) 5 MG tablet Take 5 mg by mouth daily. 09/22/16   [provider]  fluticasone Aleda Grana)  50 MCG/ACT nasal spray Place 1 spray into both nostrils daily. Patient taking differently: Place 1 spray into both nostrils daily as needed for allergies or rhinitis.  12/11/15   Love, [name], PA-C  levofloxacin (LEVAQUIN) 500 MG tablet Take 1 tablet (500 mg total) by mouth daily. 05/24/17 05/31/17  Alvira Monday, MD  LORazepam (ATIVAN) 1 MG tablet Take 1 tablet (1 mg total) by mouth 3 (three) times daily as needed for anxiety. 04/13/17   Dione Booze, MD  Multiple Vitamin (MULTIVITAMIN WITH MINERALS) TABS tablet Take 1 tablet by mouth daily.    [provider]    Family  History Family History  Problem Relation Age of Onset  . Colon cancer Mother   . Cerebral aneurysm Father     Social History Social History  Substance Use Topics  . Smoking status: Current Every Day Smoker    Packs/day: 0.25    Years: 60.00    Types: Cigarettes  . Smokeless tobacco: Never Used  . Alcohol use No     Allergies   Penicillins; Amoxicillin; and Sulfa antibiotics   Review of Systems Review of Systems  Constitutional: Negative for fever.  HENT: Negative for sore throat.   Eyes: Negative for visual disturbance.  Respiratory: Negative for shortness of breath.   Cardiovascular: Negative for chest pain.  Gastrointestinal: Positive for abdominal pain. Negative for nausea and vomiting.  Genitourinary: Positive for difficulty urinating and dysuria. Negative for flank pain.  Musculoskeletal: Negative for back pain and neck stiffness.  Skin: Negative for rash.  Neurological: Negative for syncope and headaches.     Physical Exam Updated Vital Signs BP 135/77   Pulse 71   Temp 98.5 F (36.9 C) (Oral)   Resp 18   Ht  (1.727 m)   Wt 63.5 kg (140 lb)   SpO2 93%   BMI 21.29 kg/m   Physical Exam  Constitutional: He is oriented to person, place, and time. He appears well-developed and well-nourished. No distress.  HENT:  Head: Normocephalic and atraumatic.  Eyes: Conjunctivae and EOM are normal.  Neck: Normal range of motion.  Cardiovascular: Normal rate, regular rhythm, normal heart sounds and intact distal pulses.  Exam reveals no gallop and no friction rub.   No murmur heard. Pulmonary/Chest: Effort normal and breath sounds normal. No respiratory distress. He has no wheezes. He has no rales.  Abdominal: Soft. He exhibits no distension. There is tenderness (mild suprapubic). There is no guarding.  No CVA tenderness  Musculoskeletal: He exhibits no edema.  Neurological: He is alert and oriented to person, place, and time.  Skin: Skin is warm and dry. He  is not diaphoretic.  Nursing note and vitals reviewed.    ED Treatments / Results  Labs (all labs ordered are listed, but only abnormal results are displayed) Labs Reviewed  URINALYSIS, ROUTINE W REFLEX MICROSCOPIC - Abnormal; Notable for the following:       Result Value   APPearance HAZY (*)    Hgb urine dipstick MODERATE (*)    Nitrite POSITIVE (*)    Leukocytes, UA LARGE (*)    All other components within normal limits  URINALYSIS, MICROSCOPIC (REFLEX) - Abnormal; Notable for the following:    Bacteria, UA MANY (*)    Squamous Epithelial / LPF 0-5 (*)    All other components within normal limits  URINE CULTURE    EKG  EKG Interpretation None       Radiology No results found.  Procedures Procedures (including  critical care time)  Medications Ordered in ED Medications  lidocaine (XYLOCAINE) 2 % jelly 1 application (1 application Urethral Given 05/23/17 2319)     Initial Impression / Assessment and Plan / ED Course  I have reviewed the triage vital signs and the nursing notes.  Pertinent labs & imaging results that were available during my care of the patient were reviewed by me and considered in my medical decision making (see chart for details).     82 year old male with a history of COPD, dementia, BPH, hypercholesterolemia, CVA, chronic indwelling Foley presents with concern for suprapubic pressure, concern for urine leaking around the Foley catheter. Doubt appendicitis,AAA, cholecystitis. Patient describing symptoms which may be consistent with his urinary tract infections. Removed Foley catheter and replaced. Urinalysis concerning for possible urinary tract infection. Discussed with patient and wife that it might be related to prior colonization, however given his symptoms feel it is appropriate to culture and treat. Given rx for levofloxacin.   Final Clinical Impressions(s) / ED Diagnoses   Final diagnoses:  Urinary tract infection without hematuria, site  unspecified      Alvira Monday, MD 05/25/17 [phone]

## 2017-05-23 NOTE — ED Triage Notes (Signed)
Patients wife states that the pateint was complaining of abdominal pain earlier today around the catheter. The patient denies any pain at this time. But reports that "there is something wrong with his bag"

## 2017-05-24 MED ORDER — LEVOFLOXACIN 500 MG PO TABS: 500.0000 mg | ORAL_TABLET | Freq: Every day | ORAL | 0 refills | Status: AC

## 2017-05-24 MED ORDER — CEPHALEXIN 500 MG PO CAPS
500.0000 mg | ORAL_CAPSULE | Freq: Three times a day (TID) | ORAL | 0 refills | Status: DC
Start: 2017-05-24 — End: 2017-05-24

## 2017-05-24 NOTE — ED Notes (Signed)
Signature pad not working in pt's room. Pt verbalized understanding of d/c instructions.

## 2017-05-26 LAB — URINE CULTURE

## 2017-05-27 ENCOUNTER — Telehealth: Payer: Self-pay | Admitting: *Deleted

## 2017-05-27 NOTE — Telephone Encounter (Signed)
Post ED Visit - Positive Culture Follow-up  Culture report reviewed by antimicrobial stewardship pharmacist:   Enzo Bi, Pharm.D.  Celedonio Miyamoto, Pharm.D., BCPS AQ-ID  Garvin Fila, Pharm.D., BCPS  Georgina Pillion, Pharm.D., BCPS  [place], [address].D., BCPS, AAHIVP  Estella Husk, Pharm.D., BCPS, AAHIVP  Lysle Pearl, PharmD, BCPS  Casilda Carls, PharmD, BCPS  Pollyann Samples, PharmD, BCPS  Positive urine culture Treated with Levofloxacin and Cephalexin, organism sensitive to the same and no further patient follow-up is required at this time.  Virl Axe [facility] 05/27/2017, 11:56 AM

## 2017-05-31 ENCOUNTER — Emergency Department (HOSPITAL_BASED_OUTPATIENT_CLINIC_OR_DEPARTMENT_OTHER)
Admission: EM | Admit: 2017-05-31 | Discharge: 2017-05-31 | Disposition: A | Discharge status: Home / Self Care | Payer: Medicare Other | Attending: Emergency Medicine | Admitting: Emergency Medicine

## 2017-05-31 DIAGNOSIS — Z978 Presence of other specified devices: Secondary | ICD-10-CM

## 2017-05-31 DIAGNOSIS — Z79899 Other long term (current) drug therapy: Secondary | ICD-10-CM | POA: Diagnosis not present

## 2017-05-31 DIAGNOSIS — F17210 Nicotine dependence, cigarettes, uncomplicated: Secondary | ICD-10-CM | POA: Insufficient documentation

## 2017-05-31 DIAGNOSIS — F0390 Unspecified dementia without behavioral disturbance: Secondary | ICD-10-CM | POA: Diagnosis not present

## 2017-05-31 DIAGNOSIS — R339 Retention of urine, unspecified: Secondary | ICD-10-CM | POA: Diagnosis present

## 2017-05-31 DIAGNOSIS — J449 Chronic obstructive pulmonary disease, unspecified: Secondary | ICD-10-CM | POA: Insufficient documentation

## 2017-05-31 DIAGNOSIS — T83098A Other mechanical complication of other indwelling urethral catheter, initial encounter: Secondary | ICD-10-CM | POA: Diagnosis not present

## 2017-05-31 DIAGNOSIS — Y828 Other medical devices associated with adverse incidents: Secondary | ICD-10-CM | POA: Insufficient documentation

## 2017-05-31 DIAGNOSIS — Z960 Presence of urogenital implants: Secondary | ICD-10-CM

## 2017-05-31 LAB — URINALYSIS, ROUTINE W REFLEX MICROSCOPIC
BILIRUBIN URINE: NEGATIVE
Glucose, UA: NEGATIVE mg/dL
Ketones, ur: NEGATIVE mg/dL
Nitrite: POSITIVE — AB
PH: 6.5 (ref 5.0–8.0)
Protein, ur: NEGATIVE mg/dL

## 2017-05-31 LAB — URINALYSIS, MICROSCOPIC (REFLEX)
RBC / HPF: NONE SEEN RBC/hpf (ref 0–5)
Squamous Epithelial / LPF: NONE SEEN

## 2017-05-31 NOTE — ED Provider Notes (Signed)
[facility]   CSN: [id number] Arrival date & time: 05/31/17  1529     History   Chief Complaint Chief Complaint  Patient presents with  . Dysuria    HPI David Duke is a 82 y.o. male history of dementia, BPH with urinary retention with chronic Foley, here presenting with pain at the Foley site. She didn't actually was seen in the ED several days ago and had the Foley that was replaced. He was prescribed levaquin but hasn't been taking it. He states that occasionally he has some pain at the tip of his penis when he urinates. Patient is demented and can't give much history. But the way, he occasionally has some pain at the site but the urine appears very clear. Denies any abdominal pain or vomiting or fevers.  The history is provided by the patient and the spouse.   Level V caveat- dementia   Past Medical History:  Diagnosis Date  . AKI (acute kidney injury) (HCC)   . Anxiety   . Blind right eye   . BPH (benign prostatic hypertrophy) with urinary retention   . COPD (chronic obstructive pulmonary disease) (HCC)   . Dementia   . Depression   . High cholesterol   . Macular degeneration of both eyes   . Renal disorder   . Shortness of breath dyspnea   . Stroke [facility])    has residual slurred speech    Patient Active Problem List   Diagnosis Date Noted  . Elevated troponin 03/27/2017  . Diplopia 03/27/2017  . Bilateral leg edema 03/27/2017  . Enterocolitis 09/30/2016  . HLD (hyperlipidemia) 09/30/2016  . Tobacco abuse 09/30/2016  . Nausea vomiting and diarrhea 09/30/2016  . History of stroke in prior 3 months 01/22/2016  . Chronic tension-type headache, not intractable   . Acute frontal sinusitis   . 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 Gait disturbance, post-stroke   . Ataxia, late effect of cerebrovascular disease   . BPH (benign prostatic hyperplasia)   . Urinary retention   . Adjustment disorder with mixed  anxiety and depressed mood   . Chronic obstructive pulmonary disease (HCC)   . Cephalalgia   . AKI (acute kidney injury) (HCC)   . Macular degeneration   . Prediabetes   . Hypokalemia   . Absolute anemia   . Aphasia   . Acute encephalopathy 11/25/2015  . Hyponatremia 11/25/2015  . Acute kidney injury (HCC) 11/25/2015  . Depression   . Anxiety   . COPD (chronic obstructive pulmonary disease) (HCC)   . ICH (intracerebral hemorrhage) (HCC) 11/22/2015    Past Surgical History:  Procedure Laterality Date  . NO PAST SURGERIES         Home Medications    Prior to Admission medications   Medication Sig Start Date End Date Taking? Authorizing Provider  albuterol (PROVENTIL HFA;VENTOLIN HFA) 108 (90 Base) MCG/ACT inhaler Inhale 1-2 puffs into the lungs every 6 (six) hours as needed for wheezing or shortness of breath. 12/28/16   Long, Arlyss Repress, MD  atorvastatin (LIPITOR) 20 MG tablet Take 1 tablet (20 mg total) by mouth daily at 6 PM. Patient not taking: Reported on 03/27/2017 09/03/16   Saguier, Ramon Dredge, PA-C  diazepam (VALIUM) 5 MG tablet Take 1 tablet (5 mg total) by mouth every 8 (eight) hours as needed for anxiety. Can fill no sooner than October 24, 2016. Patient taking differently: Take 5 mg by mouth 4 (four) times  daily.  10/20/16   Saguier, Ramon Dredge, PA-C  finasteride (PROSCAR) 5 MG tablet Take 5 mg by mouth daily. 09/22/16   [provider]  fluticasone (FLONASE) 50 MCG/ACT nasal spray Place 1 spray into both nostrils daily. Patient taking differently: Place 1 spray into both nostrils daily as needed for allergies or rhinitis.  12/11/15   Love, [name], PA-C  levofloxacin (LEVAQUIN) 500 MG tablet Take 1 tablet (500 mg total) by mouth daily. 05/24/17 05/31/17  Alvira Monday, MD  LORazepam (ATIVAN) 1 MG tablet Take 1 tablet (1 mg total) by mouth 3 (three) times daily as needed for anxiety. 04/13/17   Dione Booze, MD  Multiple Vitamin (MULTIVITAMIN WITH MINERALS) TABS tablet  Take 1 tablet by mouth daily.    [provider]    Family History Family History  Problem Relation Age of Onset  . Colon cancer Mother   . Cerebral aneurysm Father     Social History Social History  Substance Use Topics  . Smoking status: Current Every Day Smoker    Packs/day: 0.25    Years: 60.00    Types: Cigarettes  . Smokeless tobacco: Never Used  . Alcohol use No     Allergies   Penicillins; Amoxicillin; and Sulfa antibiotics   Review of Systems Review of Systems  Genitourinary: Positive for dysuria.  All other systems reviewed and are negative.    Physical Exam Updated Vital Signs BP (!) 131/91 (BP Location: Left Arm)   Pulse 93   Temp 98.5 F (36.9 C) (Oral)   Resp 20   SpO2 98%   Physical Exam  Constitutional:  Chronically ill, demented   HENT:  Head: Normocephalic.  Mouth/Throat: Oropharynx is clear and moist.  Eyes: Pupils are equal, round, and reactive to light. Conjunctivae and EOM are normal.  Neck: Normal range of motion. Neck supple.  Cardiovascular: Normal rate, regular rhythm and normal heart sounds.   Pulmonary/Chest: Effort normal and breath sounds normal. No respiratory distress. He has no wheezes.  Abdominal: Soft. Bowel sounds are normal. There is no tenderness.  Genitourinary:  Genitourinary Comments: Foley in place with clear urine   Musculoskeletal: Normal range of motion.  Neurological: He is alert.  Skin: Skin is warm.  Psychiatric: He has a normal mood and affect.  Nursing note and vitals reviewed.    ED Treatments / Results  Labs (all labs ordered are listed, but only abnormal results are displayed) Labs Reviewed  URINALYSIS, ROUTINE W REFLEX MICROSCOPIC - Abnormal; Notable for the following:       Result Value   Specific Gravity, Urine <1.005 (*)    Hgb urine dipstick TRACE (*)    Nitrite POSITIVE (*)    Leukocytes, UA LARGE (*)    All other components within normal limits  URINALYSIS, MICROSCOPIC  (REFLEX) - Abnormal; Notable for the following:    Bacteria, UA FEW (*)    All other components within normal limits  URINE CULTURE    EKG  EKG Interpretation None       Radiology No results found.  Procedures Procedures (including critical care time)  EMERGENCY DEPARTMENT ULTRASOUND  Study: Limited Ultrasound of Bladder  INDICATIONS: to assess for urinary retention and/or bladder volume prior to urinary catheter Multiple views of the bladder were obtained in real-time in the transverse and longitudinal planes with a multi-frequency probe.  PERFORMED BY: Myself IMAGES ARCHIVED?: No LIMITATIONS:  Emergent procedure INTERPRETATION: foley in bladder         Medications Ordered  in ED Medications - No data to display   Initial Impression / Assessment and Plan / ED Course  I have reviewed the triage vital signs and the nursing notes.  Pertinent labs & imaging results that were available during my care of the patient were reviewed by me and considered in my medical decision making (see chart for details).     David Duke is a 82 y.o. male here with pain with catheter. No hematuria. [place] confirmed placement in the bladder. Recent urine culture showed no obvious UTI, just colonization. I repositioned the foley and he felt better. Will check UA, urine culture. Suspect just discomfort from foley.   5:47 PM UA consistent with previous, minimal WBC in UA. Likely positioning of foley. Will dc home.   Final Clinical Impressions(s) / ED Diagnoses   Final diagnoses:  None    New Prescriptions New Prescriptions   No medications on file     Charlynne Pander, MD 05/31/17 [phone]

## 2017-05-31 NOTE — Discharge Instructions (Signed)
You don't need to take the antibiotic.   See your urologist in a week for follow up  Return to ER if you have worse penile pain, blood in foley pain, foley not draining

## 2017-05-31 NOTE — ED Triage Notes (Signed)
PResents with urinary catheter. Pt had one replaced here last week. He is currently complaining of urethra burning at the site where the catheter is inserted and pain. Denies fevers.

## 2017-06-02 LAB — URINE CULTURE

## 2017-06-05 ENCOUNTER — Other Ambulatory Visit: Payer: Self-pay | Admitting: Cardiology

## 2017-06-05 ENCOUNTER — Emergency Department (HOSPITAL_BASED_OUTPATIENT_CLINIC_OR_DEPARTMENT_OTHER)
Admission: EM | Admit: 2017-06-05 | Discharge: 2017-06-05 | Disposition: A | Discharge status: Home / Self Care | Payer: Medicare Other | Attending: Emergency Medicine | Admitting: Emergency Medicine

## 2017-06-05 ENCOUNTER — Encounter (HOSPITAL_BASED_OUTPATIENT_CLINIC_OR_DEPARTMENT_OTHER): Payer: Self-pay | Admitting: Emergency Medicine

## 2017-06-05 DIAGNOSIS — F0390 Unspecified dementia without behavioral disturbance: Secondary | ICD-10-CM | POA: Diagnosis not present

## 2017-06-05 DIAGNOSIS — R531 Weakness: Secondary | ICD-10-CM | POA: Diagnosis not present

## 2017-06-05 DIAGNOSIS — R7303 Prediabetes: Secondary | ICD-10-CM | POA: Diagnosis not present

## 2017-06-05 DIAGNOSIS — Z8673 Personal history of transient ischemic attack (TIA), and cerebral infarction without residual deficits: Secondary | ICD-10-CM | POA: Diagnosis not present

## 2017-06-05 DIAGNOSIS — Z79899 Other long term (current) drug therapy: Secondary | ICD-10-CM | POA: Insufficient documentation

## 2017-06-05 DIAGNOSIS — J449 Chronic obstructive pulmonary disease, unspecified: Secondary | ICD-10-CM | POA: Diagnosis not present

## 2017-06-05 DIAGNOSIS — F410 Panic disorder [episodic paroxysmal anxiety] without agoraphobia: Secondary | ICD-10-CM | POA: Diagnosis not present

## 2017-06-05 DIAGNOSIS — F17210 Nicotine dependence, cigarettes, uncomplicated: Secondary | ICD-10-CM | POA: Insufficient documentation

## 2017-06-05 DIAGNOSIS — R079 Chest pain, unspecified: Secondary | ICD-10-CM

## 2017-06-05 DIAGNOSIS — F419 Anxiety disorder, unspecified: Secondary | ICD-10-CM | POA: Insufficient documentation

## 2017-06-05 LAB — BASIC METABOLIC PANEL
Anion gap: 7 (ref 5–15)
BUN: 16 mg/dL (ref 6–20)
CHLORIDE: 106 mmol/L (ref 101–111)
CO2: 26 mmol/L (ref 22–32)
Calcium: 8.8 mg/dL — ABNORMAL LOW (ref 8.9–10.3)
Creatinine, Ser: 1.42 mg/dL — ABNORMAL HIGH (ref 0.61–1.24)
GFR calc Af Amer: 52 mL/min — ABNORMAL LOW (ref >60)
GFR calc non Af Amer: 45 mL/min — ABNORMAL LOW (ref >60)
GLUCOSE: 99 mg/dL (ref 65–99)
POTASSIUM: 3.9 mmol/L (ref 3.5–5.1)
Sodium: 139 mmol/L (ref 135–145)

## 2017-06-05 LAB — CBC
HEMATOCRIT: 42.7 % (ref 39.0–52.0)
Hemoglobin: 14.3 g/dL (ref 13.0–17.0)
MCH: 30.6 pg (ref 26.0–34.0)
MCHC: 33.5 g/dL (ref 30.0–36.0)
MCV: 91.2 fL (ref 78.0–100.0)
Platelets: 214 10*3/uL (ref 150–400)
RBC: 4.68 MIL/uL (ref 4.22–5.81)
RDW: 13.7 % (ref 11.5–15.5)
WBC: 9.9 10*3/uL (ref 4.0–10.5)

## 2017-06-05 NOTE — Discharge Instructions (Addendum)
You likely had a panic attack. We strongly recommend you tapering off valium under the direction of your PCP. I would discuss starting an ant-anxiety medication (Remeron) to help control these attacks. You could also consider seeing a counselor.

## 2017-06-05 NOTE — ED Provider Notes (Signed)
[facility] Provider Note   CSN: [id number] Arrival date & time: 06/05/17  1538     History   Chief Complaint Chief Complaint  Patient presents with  . Weakness  . Anxiety    HPI David Duke is a 82 y.o. male.  HPI   Patient with past medical history significant for dementia, stroke, anxiety, chronic indwelling catheter presenting with a panic attack. States that around 4:30 today he had a sudden onset of a panic attack. Patient states he was shaking all over. Wife called EMS and brought here to be evaluated. Patient denied any chest pain or shortness of breath associated with this panic attack. States it is similar to his previous panic attacks. Patient states that about one year ago he had a CVA and then developed panic attacks. Patient has been on benzodiazepines for this issue per PCP. Initially patient improved on benzodiazepines, however more recently he states they have not helped as much. He has discussed this issue with his primary care physician, planning on discussing more. Patient denies any other symptoms. Previously diagnosed with a UTI, however his last urinare culture was negative for signs of UTI, therefore patient stopped antibiotics. Patient denies any fevers chills, nausea, vomiting, or abdominal pain. States that he has had decreased appetite. Denies any weakness in his limbs, notes some bilateral numbness in his fingertips this is old. Per wife patient has been declining over the past several months in terms of cognition.  Past Medical History:  Diagnosis Date  . AKI (acute kidney injury) (HCC)   . Anxiety   . Blind right eye   . BPH (benign prostatic hypertrophy) with urinary retention   . COPD (chronic obstructive pulmonary disease) (HCC)   . Dementia   . Depression   . High cholesterol   . Macular degeneration of both eyes   . Renal disorder   . Shortness of breath dyspnea   . Stroke [facility])    has residual slurred speech    Patient  Active Problem List   Diagnosis Date Noted  . Elevated troponin 03/27/2017  . Diplopia 03/27/2017  . Bilateral leg edema 03/27/2017  . Enterocolitis 09/30/2016  . HLD (hyperlipidemia) 09/30/2016  . Tobacco abuse 09/30/2016  . Nausea vomiting and diarrhea 09/30/2016  . History of stroke in prior 3 months 01/22/2016  . Chronic tension-type headache, not intractable   . Acute frontal sinusitis   . 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 Gait disturbance, post-stroke   . Ataxia, late effect of cerebrovascular disease   . BPH (benign prostatic hyperplasia)   . Urinary retention   . Adjustment disorder with mixed anxiety and depressed mood   . Chronic obstructive pulmonary disease (HCC)   . Cephalalgia   . AKI (acute kidney injury) (HCC)   . Macular degeneration   . Prediabetes   . Hypokalemia   . Absolute anemia   . Aphasia   . Acute encephalopathy 11/25/2015  . Hyponatremia 11/25/2015  . Acute kidney injury (HCC) 11/25/2015  . Depression   . Anxiety   . COPD (chronic obstructive pulmonary disease) (HCC)   . ICH (intracerebral hemorrhage) (HCC) 11/22/2015    Past Surgical History:  Procedure Laterality Date  . NO PAST SURGERIES         Home Medications    Prior to Admission medications   Medication Sig Start Date End Date Taking? Authorizing Provider  albuterol (PROVENTIL HFA;VENTOLIN HFA) 108 (90 Base) MCG/ACT inhaler Inhale 1-2  puffs into the lungs every 6 (six) hours as needed for wheezing or shortness of breath. 12/28/16   Long, Arlyss Repress, MD  atorvastatin (LIPITOR) 20 MG tablet Take 1 tablet (20 mg total) by mouth daily at 6 PM. Patient not taking: Reported on 03/27/2017 09/03/16   Saguier, Ramon Dredge, PA-C  diazepam (VALIUM) 5 MG tablet Take 1 tablet (5 mg total) by mouth every 8 (eight) hours as needed for anxiety. Can fill no sooner than October 24, 2016. Patient taking differently: Take 5 mg by mouth 4 (four) times daily.  10/20/16   Saguier,  Ramon Dredge, PA-C  finasteride (PROSCAR) 5 MG tablet Take 5 mg by mouth daily. 09/22/16   [provider]  fluticasone (FLONASE) 50 MCG/ACT nasal spray Place 1 spray into both nostrils daily. Patient taking differently: Place 1 spray into both nostrils daily as needed for allergies or rhinitis.  12/11/15   Love, [name], PA-C  LORazepam (ATIVAN) 1 MG tablet Take 1 tablet (1 mg total) by mouth 3 (three) times daily as needed for anxiety. 04/13/17   Dione Booze, MD  Multiple Vitamin (MULTIVITAMIN WITH MINERALS) TABS tablet Take 1 tablet by mouth daily.    [provider]    Family History Family History  Problem Relation Age of Onset  . Colon cancer Mother   . Cerebral aneurysm Father     Social History Social History  Substance Use Topics  . Smoking status: Current Every Day Smoker    Packs/day: 0.25    Years: 60.00    Types: Cigarettes  . Smokeless tobacco: Never Used  . Alcohol use No     Allergies   Penicillins; Amoxicillin; and Sulfa antibiotics   Review of Systems Review of Systems   Physical Exam Updated Vital Signs BP 120/71 (BP Location: Right Arm)   Pulse 88   Resp 16   SpO2 98%   Physical Exam  Constitutional: He appears well-developed and well-nourished.  HENT:  Head: Normocephalic and atraumatic.  Eyes: Conjunctivae are normal.  Neck: Neck supple.  Cardiovascular: Normal rate and regular rhythm.   No murmur heard. Pulmonary/Chest: Effort normal and breath sounds normal. No respiratory distress.  Abdominal: Soft. There is no tenderness.  Musculoskeletal:  Trace edema in ankles  Neurological: He is alert. No cranial nerve deficit or sensory deficit. He exhibits normal muscle tone. Coordination normal.  Skin: Skin is warm and dry.  Psychiatric: He has a normal mood and affect.  Nursing note and vitals reviewed.    ED Treatments / Results  Labs (all labs ordered are listed, but only abnormal results are displayed) Labs Reviewed  BASIC  METABOLIC PANEL - Abnormal; Notable for the following:       Result Value   Creatinine, Ser 1.42 (*)    Calcium 8.8 (*)    GFR calc non Af Amer 45 (*)    GFR calc Af Amer 52 (*)    All other components within normal limits  CBC    EKG  EKG Interpretation  Date/Time:  Friday June 05 2017 15:51:23 EDT Ventricular Rate:  80 PR Interval:  152 QRS Duration: 104 QT Interval:  370 QTC Calculation: 426 R Axis:   -76 Text Interpretation:  Normal sinus rhythm Left axis deviation Incomplete right bundle branch block Cannot rule out Inferior infarct , age undetermined Abnormal ECG No significant change since last tracing Confirmed by Frederick Peers [phone]) on 06/05/2017 6:29:42 PM       Radiology No results found.  Procedures Procedures (  including critical care time)  Medications Ordered in ED Medications - No data to display   Initial Impression / Assessment and Plan / ED Course  I have reviewed the triage vital signs and the nursing notes.  Pertinent labs & imaging results that were available during my care of the patient were reviewed by me and considered in my medical decision making (see chart for details).   Patient with a anxiety attack at home. Following up with PCP in the near future for better control of his anxiety. Vitals are within normal limits. No concern for ACS given lack of chest pain or shortness of breath, EKG similar to previous No concern for infection given no history of fevers, nausea, vomiting or cough. Labs all within normal limits. Follow-up with PCP in in the next couple of days to discuss further treatment of patient's anxiety.   Final Clinical Impressions(s) / ED Diagnoses   Final diagnoses:  None    New Prescriptions New Prescriptions   No medications on file     Berton Bon, MD 06/05/17 1911    Little, Ambrose Finland, MD 06/07/17 [phone]

## 2017-06-05 NOTE — ED Notes (Signed)
Pt up walking in the hallway without difficulty.

## 2017-06-05 NOTE — ED Triage Notes (Addendum)
Pt arrived via [facility]. Per wife pt had scheduled dr appt at 345 this afternoon for congestion. He started c/o "feeling weird" just before they were supposed to leave and told her to call 911. Pt states he still feels "out of sorts". Pt denies pain. Pt endorses nausea. Pt also reports feeling anxious.

## 2017-06-08 ENCOUNTER — Telehealth (HOSPITAL_COMMUNITY): Payer: Self-pay | Admitting: Cardiology

## 2017-06-11 NOTE — Telephone Encounter (Signed)
Called and spoke with patient and he stated that his doctor told him that this test was not needed right now. Sent message to Suzzette Righter to further advise.

## 2017-06-21 ENCOUNTER — Emergency Department (HOSPITAL_BASED_OUTPATIENT_CLINIC_OR_DEPARTMENT_OTHER): Payer: Medicare Other

## 2017-06-21 ENCOUNTER — Encounter (HOSPITAL_BASED_OUTPATIENT_CLINIC_OR_DEPARTMENT_OTHER): Payer: Self-pay | Admitting: Emergency Medicine

## 2017-06-21 ENCOUNTER — Emergency Department (HOSPITAL_BASED_OUTPATIENT_CLINIC_OR_DEPARTMENT_OTHER)
Admission: EM | Admit: 2017-06-21 | Discharge: 2017-06-21 | Disposition: A | Discharge status: Home / Self Care | Payer: Medicare Other | Attending: Emergency Medicine | Admitting: Emergency Medicine

## 2017-06-21 DIAGNOSIS — R0602 Shortness of breath: Secondary | ICD-10-CM | POA: Insufficient documentation

## 2017-06-21 DIAGNOSIS — J441 Chronic obstructive pulmonary disease with (acute) exacerbation: Secondary | ICD-10-CM

## 2017-06-21 DIAGNOSIS — Z8673 Personal history of transient ischemic attack (TIA), and cerebral infarction without residual deficits: Secondary | ICD-10-CM | POA: Insufficient documentation

## 2017-06-21 DIAGNOSIS — Z79899 Other long term (current) drug therapy: Secondary | ICD-10-CM | POA: Diagnosis not present

## 2017-06-21 DIAGNOSIS — J069 Acute upper respiratory infection, unspecified: Secondary | ICD-10-CM | POA: Diagnosis not present

## 2017-06-21 DIAGNOSIS — F17210 Nicotine dependence, cigarettes, uncomplicated: Secondary | ICD-10-CM | POA: Diagnosis not present

## 2017-06-21 DIAGNOSIS — F0390 Unspecified dementia without behavioral disturbance: Secondary | ICD-10-CM | POA: Insufficient documentation

## 2017-06-21 DIAGNOSIS — R05 Cough: Secondary | ICD-10-CM | POA: Diagnosis present

## 2017-06-21 LAB — BASIC METABOLIC PANEL
ANION GAP: 13 (ref 5–15)
BUN: 12 mg/dL (ref 6–20)
CALCIUM: 8.6 mg/dL — AB (ref 8.9–10.3)
CO2: 20 mmol/L — ABNORMAL LOW (ref 22–32)
CREATININE: 1.57 mg/dL — AB (ref 0.61–1.24)
Chloride: 104 mmol/L (ref 101–111)
GFR, EST AFRICAN AMERICAN: 46 mL/min — AB (ref >60)
GFR, EST NON AFRICAN AMERICAN: 40 mL/min — AB (ref >60)
GLUCOSE: 229 mg/dL — AB (ref 65–99)
Potassium: 3.7 mmol/L (ref 3.5–5.1)
Sodium: 137 mmol/L (ref 135–145)

## 2017-06-21 LAB — CBC WITH DIFFERENTIAL/PLATELET
BASOS ABS: 0 10*3/uL (ref 0.0–0.1)
Basophils Relative: 0 %
Eosinophils Absolute: 0 10*3/uL (ref 0.0–0.7)
Eosinophils Relative: 0 %
HEMATOCRIT: 41.6 % (ref 39.0–52.0)
Hemoglobin: 13.9 g/dL (ref 13.0–17.0)
Lymphocytes Relative: 5 %
Lymphs Abs: 0.4 10*3/uL — ABNORMAL LOW (ref 0.7–4.0)
MCH: 30.5 pg (ref 26.0–34.0)
MCHC: 33.4 g/dL (ref 30.0–36.0)
MCV: 91.2 fL (ref 78.0–100.0)
MONO ABS: 0.1 10*3/uL (ref 0.1–1.0)
Monocytes Relative: 1 %
NEUTROS ABS: 6.2 10*3/uL (ref 1.7–7.7)
Neutrophils Relative %: 94 %
Platelets: 235 10*3/uL (ref 150–400)
RBC: 4.56 MIL/uL (ref 4.22–5.81)
RDW: 13.5 % (ref 11.5–15.5)
WBC: 6.6 10*3/uL (ref 4.0–10.5)

## 2017-06-21 LAB — BRAIN NATRIURETIC PEPTIDE: B Natriuretic Peptide: 40.6 pg/mL (ref 0.0–100.0)

## 2017-06-21 LAB — TROPONIN I: Troponin I: <0.03 ng/mL (ref <0.03)

## 2017-06-21 MED ORDER — IPRATROPIUM-ALBUTEROL 0.5-2.5 (3) MG/3ML IN SOLN
RESPIRATORY_TRACT | Status: AC
  Administered 2017-06-21: 3 mL
  Filled 2017-06-21: qty 3

## 2017-06-21 MED ORDER — GUAIFENESIN ER 600 MG PO TB12: 1200.0000 mg | ORAL_TABLET | Freq: Two times a day (BID) | ORAL | 0 refills | Status: DC

## 2017-06-21 MED ORDER — AEROCHAMBER PLUS FLO-VU MEDIUM MISC
1.0000 | Freq: Once | Status: AC
  Administered 2017-06-21: 1
  Filled 2017-06-21: qty 1

## 2017-06-21 MED ORDER — ALBUTEROL SULFATE (2.5 MG/3ML) 0.083% IN NEBU
INHALATION_SOLUTION | RESPIRATORY_TRACT | Status: AC
  Administered 2017-06-21: 2.5 mg
  Filled 2017-06-21: qty 3

## 2017-06-21 MED ORDER — PREDNISONE 20 MG PO TABS: 40.0000 mg | ORAL_TABLET | Freq: Every day | ORAL | 0 refills | Status: DC

## 2017-06-21 MED ORDER — AZITHROMYCIN 250 MG PO TABS: ORAL_TABLET | ORAL | 0 refills | Status: DC

## 2017-06-21 MED ORDER — IPRATROPIUM BROMIDE 0.02 % IN SOLN
0.5000 mg | Freq: Once | RESPIRATORY_TRACT | Status: AC
  Administered 2017-06-21: 0.5 mg via RESPIRATORY_TRACT
  Filled 2017-06-21: qty 2.5

## 2017-06-21 MED ORDER — ALBUTEROL SULFATE (2.5 MG/3ML) 0.083% IN NEBU
5.0000 mg | INHALATION_SOLUTION | Freq: Once | RESPIRATORY_TRACT | Status: AC
  Administered 2017-06-21: 5 mg via RESPIRATORY_TRACT
  Filled 2017-06-21: qty 6

## 2017-06-21 NOTE — ED Notes (Signed)
Pt ambulated here in the dept about 100 steps. HR 111, RR 20, SpO2 92%. Pt states "I just want something for this congestion." PA made aware.

## 2017-06-21 NOTE — ED Provider Notes (Signed)
[facility]   CSN: [id number] Arrival date & time: 06/21/17  1242     History   Chief Complaint Chief Complaint  Patient presents with  . Cough    HPI David Duke is a 82 y.o. David Duke presents emergency Department with chief complaint of shortness of breath.he has had a productive cough for the past several days. He complains of right lower thoracic rib pain and pain with coughing. He denies fevers or chills. He has a history of COPD. He has been using his inhaler at home without relief. He has been using it isn't increasingly over the past few days. He denies fevers or chills.  HPI  Past Medical History:  Diagnosis Date  . AKI (acute kidney injury) (HCC)   . Anxiety   . Blind right eye   . BPH (benign prostatic hypertrophy) with urinary retention   . COPD (chronic obstructive pulmonary disease) (HCC)   . Dementia   . Depression   . High cholesterol   . Macular degeneration of both eyes   . Renal disorder   . Shortness of breath dyspnea   . Stroke [facility])    has residual slurred speech    Patient Active Problem List   Diagnosis Date Noted  . Elevated troponin 03/27/2017  . Diplopia 03/27/2017  . Bilateral leg edema 03/27/2017  . Enterocolitis 09/30/2016  . HLD (hyperlipidemia) 09/30/2016  . Tobacco abuse 09/30/2016  . Nausea vomiting and diarrhea 09/30/2016  . History of stroke in prior 3 months 01/22/2016  . Chronic tension-type headache, not intractable   . Acute frontal sinusitis   . 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 Gait disturbance, post-stroke   . Ataxia, late effect of cerebrovascular disease   . BPH (benign prostatic hyperplasia)   . Urinary retention   . Adjustment disorder with mixed anxiety and depressed mood   . Chronic obstructive pulmonary disease (HCC)   . Cephalalgia   . AKI (acute kidney injury) (HCC)   . Macular degeneration   . Prediabetes   . Hypokalemia   . Absolute anemia     . Aphasia   . Acute encephalopathy 11/25/2015  . Hyponatremia 11/25/2015  . Acute kidney injury (HCC) 11/25/2015  . Depression   . Anxiety   . COPD (chronic obstructive pulmonary disease) (HCC)   . ICH (intracerebral hemorrhage) (HCC) 11/22/2015    Past Surgical History:  Procedure Laterality Date  . NO PAST SURGERIES         Home Medications    Prior to Admission medications   Medication Sig Start Date End Date Taking? Authorizing Provider  albuterol (PROVENTIL HFA;VENTOLIN HFA) 108 (90 Base) MCG/ACT inhaler Inhale 1-2 puffs into the lungs every 6 (six) hours as needed for wheezing or shortness of breath. 12/28/16   Long, Arlyss Repress, MD  atorvastatin (LIPITOR) 20 MG tablet Take 1 tablet (20 mg total) by mouth daily at 6 PM. Patient not taking: Reported on 03/27/2017 09/03/16   Saguier, Ramon Dredge, PA-C  diazepam (VALIUM) 5 MG tablet Take 1 tablet (5 mg total) by mouth every 8 (eight) hours as needed for anxiety. Can fill no sooner than October 24, 2016. Patient taking differently: Take 5 mg by mouth 4 (four) times daily.  10/20/16   Saguier, Ramon Dredge, PA-C  finasteride (PROSCAR) 5 MG tablet Take 5 mg by mouth daily. 09/22/16   [provider]  fluticasone (FLONASE) 50 MCG/ACT nasal spray Place 1 spray into both nostrils daily.  Patient taking differently: Place 1 spray into both nostrils daily as needed for allergies or rhinitis.  12/11/15   Love, [name], PA-C  LORazepam (ATIVAN) 1 MG tablet Take 1 tablet (1 mg total) by mouth 3 (three) times daily as needed for anxiety. 04/13/17   Dione Booze, MD  Multiple Vitamin (MULTIVITAMIN WITH MINERALS) TABS tablet Take 1 tablet by mouth daily.    [provider]    Family History Family History  Problem Relation Age of Onset  . Colon cancer Mother   . Cerebral aneurysm Father     Social History Social History  Substance Use Topics  . Smoking status: Current Every Day Smoker    Packs/day: 0.25    Years: 60.00    Types:  Cigarettes  . Smokeless tobacco: Never Used  . Alcohol use No     Allergies   Penicillins; Amoxicillin; and Sulfa antibiotics   Review of Systems Review of Systems  Ten systems reviewed and are negative for acute change, except as noted in the HPI.   Physical Exam Updated Vital Signs BP 121/68   Pulse (!) 108   Temp 99 F (37.2 C) (Oral)   Resp (!) 24   Ht  (1.727 m)   Wt 63.5 kg (140 lb)   SpO2 96%   BMI 21.29 kg/m   Physical Exam  Constitutional: He appears well-developed and well-nourished. No distress.  HENT:  Head: Normocephalic and atraumatic.  Eyes: Conjunctivae are normal. No scleral icterus.  Neck: Normal range of motion. Neck supple.  Cardiovascular: Normal rate, regular rhythm and normal heart sounds.   Pulmonary/Chest: Effort normal. No respiratory distress. He has wheezes in the right upper field, the right middle field, the right lower field, the left upper field, the left middle field and the left lower field. He has rhonchi in the right lower field.  Abdominal: Soft. There is no tenderness.  Musculoskeletal: He exhibits no edema.  Neurological: He is alert.  Skin: Skin is warm and dry. He is not diaphoretic.  Psychiatric: His behavior is normal.  Nursing note and vitals reviewed.    ED Treatments / Results  Labs (all labs ordered are listed, but only abnormal results are displayed) Labs Reviewed  BASIC METABOLIC PANEL - Abnormal; Notable for the following:       Result Value   CO2 20 (*)    Glucose, Bld 229 (*)    Creatinine, Ser 1.57 (*)    Calcium 8.6 (*)    GFR calc non Af Amer 40 (*)    GFR calc Af Amer 46 (*)    All other components within normal limits  CBC WITH DIFFERENTIAL/PLATELET - Abnormal; Notable for the following:    Lymphs Abs 0.4 (*)    All other components within normal limits  BRAIN NATRIURETIC PEPTIDE  TROPONIN I    EKG  EKG Interpretation  Date/Time:  Sunday June 21 2017 13:37:30 EDT Ventricular Rate:    112 PR Interval:    QRS Duration: 105 QT Interval:  360 QTC Calculation: 492 R Axis:   -95 Text Interpretation:  Sinus tachycardia Right ventricular hypertrophy Inferior infarct, old Lateral leads are also involved no acute changes  Confirmed by Crista Curb [id number]) on 06/21/2017 2:38:58 PM       Radiology Dg Chest 2 View  Result Date: 06/21/2017 CLINICAL DATA:  Shortness of breath and cough. Shortness of breath beginning last night. History of COPD. EXAM: CHEST  2 VIEW COMPARISON:  PA and lateral  chest 04/03/2017 and 04/17/2015. FINDINGS: The chest is hyperexpanded. Lungs are clear. Heart size is normal. No pneumothorax or pleural effusion. No acute bony abnormality. IMPRESSION: No acute disease. Pulmonary hyperexpansion compatible with COPD. Electronically Signed   By: Drusilla Kanner M.D.   On: 06/21/2017 13:33    Procedures Procedures (including critical care time)  Medications Ordered in ED Medications  albuterol (PROVENTIL) (2.5 MG/3ML) 0.083% nebulizer solution (2.5 mg  Given 06/21/17 1303)  ipratropium-albuterol (DUONEB) 0.5-2.5 (3) MG/3ML nebulizer solution (3 mLs  Given 06/21/17 1303)  albuterol (PROVENTIL) (2.5 MG/3ML) 0.083% nebulizer solution 5 mg (5 mg Nebulization Given 06/21/17 1424)  ipratropium (ATROVENT) nebulizer solution 0.5 mg (0.5 mg Nebulization Given 06/21/17 1425)     Initial Impression / Assessment and Plan / ED Course  I have reviewed the triage vital signs and the nursing notes.  Pertinent labs & imaging results that were available during my care of the patient were reviewed by me and considered in my medical decision making (see chart for details).    Patient with URI and likely COPD exacerbation. He did call EMS last night who evaluated him at his him and gave him Solu-Medrol and a breathing treatment with near complete resolution of his symptoms. The patient given 2 DuoNeb here in the emergency department with significant improvement in his  breathing. He is able to ambulate in the emergency department with oxygen saturations above 90%. He feels improved. There is no evidence of consolidation or pneumonia. I doubt other significant cardiopulmonary process as cause. He denies chest pain at this time. Patient be discharged with mucolytic, azithromycin and a prednisone burst. I have advised him that this will make his blood sugars elevated. He must follow-up with his PCP in the next 1-2 days. Discussed return precautions with him and his daughter. He appears safe for discharge at this time.Patient seen in shared visit with attending physician. Who agrees with assessment, work up , treatment, and plan for discharge    Final Clinical Impressions(s) / ED Diagnoses   Final diagnoses:  None    New Prescriptions New Prescriptions   No medications on file     Arthor Captain, PA-C 06/21/17 1644    Lavera Guise, MD 06/21/17 1710

## 2017-06-21 NOTE — ED Triage Notes (Signed)
Patient states that he is having SOB and a course cough. He reports that last night he started to become SOB - the patient is having dysnea at rest and unable to complete sentences while talking

## 2017-06-21 NOTE — Discharge Instructions (Signed)
I'm discharging you with prednisone and azithromycin. Continue to use your inhaler as needed. Please understand that the prednisone to decrease the inflammatory process in your lung field make your blood sugars run high. Make sure to check them frequently and taking her medications as directed. Follow very closely with her primary care physician in the next 1-2 days. Return to the emergency department for the reasons listed below.    Get help right away if: You have severe or persistent: Headache. Ear pain. Sinus pain. Chest pain. You have chronic lung disease and any of the following: Wheezing. Prolonged cough. Coughing up blood. A change in your usual mucus. You have a stiff neck. You have changes in your: Vision. Hearing. Thinking. Mood.

## 2017-06-21 NOTE — ED Notes (Signed)
ED Provider at bedside. 

## 2017-06-21 NOTE — ED Notes (Signed)
Delay in xray, Pt receiving breathing tx

## 2017-06-22 ENCOUNTER — Emergency Department (HOSPITAL_BASED_OUTPATIENT_CLINIC_OR_DEPARTMENT_OTHER): Payer: Medicare Other

## 2017-06-22 ENCOUNTER — Encounter (HOSPITAL_BASED_OUTPATIENT_CLINIC_OR_DEPARTMENT_OTHER): Payer: Self-pay | Admitting: *Deleted

## 2017-06-22 ENCOUNTER — Emergency Department (HOSPITAL_BASED_OUTPATIENT_CLINIC_OR_DEPARTMENT_OTHER)
Admission: EM | Admit: 2017-06-22 | Discharge: 2017-06-22 | Disposition: A | Discharge status: Home / Self Care | Payer: Medicare Other | Attending: Emergency Medicine | Admitting: Emergency Medicine

## 2017-06-22 DIAGNOSIS — Z8673 Personal history of transient ischemic attack (TIA), and cerebral infarction without residual deficits: Secondary | ICD-10-CM | POA: Insufficient documentation

## 2017-06-22 DIAGNOSIS — R339 Retention of urine, unspecified: Secondary | ICD-10-CM | POA: Diagnosis not present

## 2017-06-22 DIAGNOSIS — Z79899 Other long term (current) drug therapy: Secondary | ICD-10-CM | POA: Diagnosis not present

## 2017-06-22 DIAGNOSIS — R110 Nausea: Secondary | ICD-10-CM | POA: Insufficient documentation

## 2017-06-22 DIAGNOSIS — R109 Unspecified abdominal pain: Secondary | ICD-10-CM | POA: Insufficient documentation

## 2017-06-22 DIAGNOSIS — J449 Chronic obstructive pulmonary disease, unspecified: Secondary | ICD-10-CM | POA: Insufficient documentation

## 2017-06-22 DIAGNOSIS — Y731 Therapeutic (nonsurgical) and rehabilitative gastroenterology and urology devices associated with adverse incidents: Secondary | ICD-10-CM | POA: Insufficient documentation

## 2017-06-22 DIAGNOSIS — T839XXA Unspecified complication of genitourinary prosthetic device, implant and graft, initial encounter: Secondary | ICD-10-CM | POA: Insufficient documentation

## 2017-06-22 DIAGNOSIS — R338 Other retention of urine: Secondary | ICD-10-CM

## 2017-06-22 DIAGNOSIS — F0390 Unspecified dementia without behavioral disturbance: Secondary | ICD-10-CM | POA: Insufficient documentation

## 2017-06-22 DIAGNOSIS — F17210 Nicotine dependence, cigarettes, uncomplicated: Secondary | ICD-10-CM | POA: Diagnosis not present

## 2017-06-22 LAB — LIPASE, BLOOD: LIPASE: 27 U/L (ref 11–51)

## 2017-06-22 LAB — CBC
HEMATOCRIT: 40.5 % (ref 39.0–52.0)
Hemoglobin: 13.5 g/dL (ref 13.0–17.0)
MCH: 30.3 pg (ref 26.0–34.0)
MCHC: 33.3 g/dL (ref 30.0–36.0)
MCV: 91 fL (ref 78.0–100.0)
Platelets: 268 10*3/uL (ref 150–400)
RBC: 4.45 MIL/uL (ref 4.22–5.81)
RDW: 13.6 % (ref 11.5–15.5)
WBC: 20.3 10*3/uL — AB (ref 4.0–10.5)

## 2017-06-22 LAB — COMPREHENSIVE METABOLIC PANEL
ALK PHOS: 72 U/L (ref 38–126)
ALT: 23 U/L (ref 17–63)
AST: 41 U/L (ref 15–41)
Albumin: 3.5 g/dL (ref 3.5–5.0)
Anion gap: 10 (ref 5–15)
BILIRUBIN TOTAL: 0.5 mg/dL (ref 0.3–1.2)
BUN: 20 mg/dL (ref 6–20)
CALCIUM: 8.7 mg/dL — AB (ref 8.9–10.3)
CO2: 23 mmol/L (ref 22–32)
CREATININE: 1.51 mg/dL — AB (ref 0.61–1.24)
Chloride: 101 mmol/L (ref 101–111)
GFR, EST AFRICAN AMERICAN: 49 mL/min — AB (ref >60)
GFR, EST NON AFRICAN AMERICAN: 42 mL/min — AB (ref >60)
Glucose, Bld: 124 mg/dL — ABNORMAL HIGH (ref 65–99)
Potassium: 4.2 mmol/L (ref 3.5–5.1)
Sodium: 134 mmol/L — ABNORMAL LOW (ref 135–145)
TOTAL PROTEIN: 6.9 g/dL (ref 6.5–8.1)

## 2017-06-22 MED ORDER — LIDOCAINE HCL 2 % EX GEL
CUTANEOUS | Status: AC
  Filled 2017-06-22: qty 20

## 2017-06-22 MED ORDER — LIDOCAINE HCL 2 % EX GEL
1.0000 | Freq: Once | CUTANEOUS | Status: AC
Start: 2017-06-22 — End: 2017-06-22
  Administered 2017-06-22: 1 via URETHRAL

## 2017-06-22 MED ORDER — ONDANSETRON 8 MG PO TBDP
8.0000 mg | ORAL_TABLET | Freq: Once | ORAL | Status: AC
  Administered 2017-06-22: 8 mg via ORAL
  Filled 2017-06-22: qty 1

## 2017-06-22 NOTE — ED Triage Notes (Signed)
Pt seen here yesterday for same , c/o SOB and abd pain , pt is requesting albuterol inhaler

## 2017-06-22 NOTE — ED Provider Notes (Addendum)
[facility] EMERGENCY DEPARTMENT Provider Note   CSN: [id number] Arrival date & time: 06/22/17  1329     History   Chief Complaint Chief Complaint  Patient presents with  . Abdominal Pain   level V caveat: Dementia HPI David Duke is a 82 y.o. male.  HPI Patient presents with complaints of nausea and abdominal discomfort.  He has a chronic indwelling Foley catheter.  Bladder scan demonstrates over 200 cc of urine.  He is unsure if the catheter has drained recently. he has dementia and therefore can provide some history but not a complete history. No vomiting No fever.  No diarrhea No CP No SOB  Past Medical History:  Diagnosis Date  . AKI (acute kidney injury) (HCC)   . Anxiety   . Blind right eye   . BPH (benign prostatic hypertrophy) with urinary retention   . COPD (chronic obstructive pulmonary disease) (HCC)   . Dementia   . Depression   . High cholesterol   . Macular degeneration of both eyes   . Renal disorder   . Shortness of breath dyspnea   . Stroke [facility])    has residual slurred speech    Patient Active Problem List   Diagnosis Date Noted  . Elevated troponin 03/27/2017  . Diplopia 03/27/2017  . Bilateral leg edema 03/27/2017  . Enterocolitis 09/30/2016  . HLD (hyperlipidemia) 09/30/2016  . Tobacco abuse 09/30/2016  . Nausea vomiting and diarrhea 09/30/2016  . History of stroke in prior 3 months 01/22/2016  . Chronic tension-type headache, not intractable   . Acute frontal sinusitis   . 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 Gait disturbance, post-stroke   . Ataxia, late effect of cerebrovascular disease   . BPH (benign prostatic hyperplasia)   . Urinary retention   . Adjustment disorder with mixed anxiety and depressed mood   . Chronic obstructive pulmonary disease (HCC)   . Cephalalgia   . AKI (acute kidney injury) (HCC)   . Macular degeneration   . Prediabetes   . Hypokalemia   . Absolute anemia    . Aphasia   . Acute encephalopathy 11/25/2015  . Hyponatremia 11/25/2015  . Acute kidney injury (HCC) 11/25/2015  . Depression   . Anxiety   . COPD (chronic obstructive pulmonary disease) (HCC)   . ICH (intracerebral hemorrhage) (HCC) 11/22/2015    Past Surgical History:  Procedure Laterality Date  . NO PAST SURGERIES         Home Medications    Prior to Admission medications   Medication Sig Start Date End Date Taking? Authorizing Provider  albuterol (PROVENTIL HFA;VENTOLIN HFA) 108 (90 Base) MCG/ACT inhaler Inhale 1-2 puffs into the lungs every 6 (six) hours as needed for wheezing or shortness of breath. 12/28/16   Long, Arlyss Repress, MD  atorvastatin (LIPITOR) 20 MG tablet Take 1 tablet (20 mg total) by mouth daily at 6 PM. Patient not taking: Reported on 03/27/2017 09/03/16   Saguier, Ramon Dredge, PA-C  azithromycin (ZITHROMAX Z-PAK) 250 MG tablet 2 po day one, then 1 daily x 4 days 06/21/17   Arthor Captain, PA-C  diazepam (VALIUM) 5 MG tablet Take 1 tablet (5 mg total) by mouth every 8 (eight) hours as needed for anxiety. Can fill no sooner than October 24, 2016. Patient taking differently: Take 5 mg by mouth 4 (four) times daily.  10/20/16   Saguier, Ramon Dredge, PA-C  finasteride (PROSCAR) 5 MG tablet Take 5 mg by mouth daily. 09/22/16  [provider]  fluticasone (FLONASE) 50 MCG/ACT nasal spray Place 1 spray into both nostrils daily. Patient taking differently: Place 1 spray into both nostrils daily as needed for allergies or rhinitis.  12/11/15   Love, [name], PA-C  guaiFENesin (MUCINEX) 600 MG 12 hr tablet Take 2 tablets (1,200 mg total) by mouth 2 (two) times daily. 06/21/17   Harris, Abigail, PA-C  LORazepam (ATIVAN) 1 MG tablet Take 1 tablet (1 mg total) by mouth 3 (three) times daily as needed for anxiety. 04/13/17   Dione Booze, MD  Multiple Vitamin (MULTIVITAMIN WITH MINERALS) TABS tablet Take 1 tablet by mouth daily.    [provider]  predniSONE  (DELTASONE) 20 MG tablet Take 2 tablets (40 mg total) by mouth daily. 06/21/17   Arthor Captain, PA-C    Family History Family History  Problem Relation Age of Onset  . Colon cancer Mother   . Cerebral aneurysm Father     Social History Social History  Substance Use Topics  . Smoking status: Current Every Day Smoker    Packs/day: 0.25    Years: 60.00    Types: Cigarettes  . Smokeless tobacco: Never Used  . Alcohol use No     Allergies   Penicillins; Amoxicillin; and Sulfa antibiotics   Review of Systems Review of Systems  Unable to perform ROS: Dementia     Physical Exam Updated Vital Signs BP 108/69 (BP Location: Right Arm)   Pulse 87   Temp 97.8 F (36.6 C) (Oral)   Resp 18   SpO2 97%   Physical Exam  Constitutional: He is oriented to person, place, and time. He appears well-developed and well-nourished.  HENT:  Head: Normocephalic and atraumatic.  Eyes: EOM are normal.  Neck: Normal range of motion.  Cardiovascular: Normal rate, regular rhythm, normal heart sounds and intact distal pulses.   Pulmonary/Chest: Effort normal and breath sounds normal. No respiratory distress.  Abdominal: Soft. He exhibits no distension. There is no tenderness.  Genitourinary:  Genitourinary Comments: Foley cath in place. Flushes but does not drain  Musculoskeletal: Normal range of motion.  Neurological: He is alert and oriented to person, place, and time.  Skin: Skin is warm and dry.  Psychiatric: He has a normal mood and affect. Judgment normal.  Nursing note and vitals reviewed.    ED Treatments / Results  Labs (all labs ordered are listed, but only abnormal results are displayed) Labs Reviewed  CBC - Abnormal; Notable for the following:       Result Value   WBC 20.3 (*)    All other components within normal limits  COMPREHENSIVE METABOLIC PANEL - Abnormal; Notable for the following:    Sodium 134 (*)    Glucose, Bld 124 (*)    Creatinine, Ser 1.51 (*)     Calcium 8.7 (*)    GFR calc non Af Amer 42 (*)    GFR calc Af Amer 49 (*)    All other components within normal limits  URINE CULTURE  LIPASE, BLOOD    EKG  EKG Interpretation None       Radiology  Dg Abd 2 Views  Result Date: 06/22/2017 CLINICAL DATA:  Nausea EXAM: ABDOMEN - 2 VIEW COMPARISON:  CT abdomen pelvis 03/20/2017 FINDINGS: Mildly distended small bowel loop in the right abdomen. No other dilated large or small bowel loops. Negative for urinary tract calculi. Lumbar scoliosis. No acute skeletal abnormality. IMPRESSION: Mildly distended small bowel loop right mid abdomen. Possible gastroenteritis or  ileus. If there is concern of small bowel obstruction, consider CT abdomen pelvis. Electronically Signed   By: Marlan Palau M.D.   On: 06/22/2017 14:47    Procedures Procedures (including critical care time)  Medications Ordered in ED Medications  lidocaine (XYLOCAINE) 2 % jelly (not administered)  ondansetron (ZOFRAN-ODT) disintegrating tablet 8 mg (8 mg Oral Given 06/22/17 1426)  lidocaine (XYLOCAINE) 2 % jelly 1 application (1 application Urethral Given 06/22/17 1549)     Initial Impression / Assessment and Plan / ED Course  I have reviewed the triage vital signs and the nursing notes.  Pertinent labs & imaging results that were available during my care of the patient were reviewed by me and considered in my medical decision making (see chart for details).     Feels much better aftercatheter replaced.  Nearly 200 cc of urine returned. White blood cell count noted. Started on steroids yesterday. Repeat abdominal exam improved. Ileus possible, doubt SBO. Home with instructions to return to the ER for new or worsening symptoms  Final Clinical Impressions(s) / ED Diagnoses   Final diagnoses:  Nausea  Acute abdominal pain  Acute urinary retention  Complication of Foley catheter, initial encounter [facility])    New Prescriptions New Prescriptions   No medications on  file     Azalia Bilis, MD 06/22/17 1610    Azalia Bilis, MD 06/22/17 1614

## 2017-06-22 NOTE — ED Notes (Signed)
Dr. Campos at bedside   

## 2017-06-22 NOTE — ED Notes (Signed)
Patient transported to X-ray 

## 2017-06-23 LAB — URINE CULTURE

## 2017-06-24 ENCOUNTER — Telehealth (HOSPITAL_COMMUNITY): Payer: Self-pay | Admitting: Cardiology

## 2017-06-24 NOTE — Telephone Encounter (Signed)
User: Trina AoGriffin, Vedika Dumlao A Date/time: 06/11/2017 3:32 PM  Comment: Called pt and spoke with him to sch myoview and he voiced that his doctor said that this test is not needed right now.   Context: [place] Schedule Orders/Appt Requests Outcome: Completed  Phone number: [phone] Phone Type: Home Phone  Comm. type: Telephone Call type: Outgoing  Contact: Marcell BarlowSchumaker, Ottis R Relation to patient: Self  Letter:

## 2017-06-28 ENCOUNTER — Emergency Department (HOSPITAL_BASED_OUTPATIENT_CLINIC_OR_DEPARTMENT_OTHER): Payer: Medicare Other

## 2017-06-28 ENCOUNTER — Encounter (HOSPITAL_BASED_OUTPATIENT_CLINIC_OR_DEPARTMENT_OTHER): Payer: Self-pay | Admitting: Emergency Medicine

## 2017-06-28 ENCOUNTER — Emergency Department (HOSPITAL_BASED_OUTPATIENT_CLINIC_OR_DEPARTMENT_OTHER)
Admission: EM | Admit: 2017-06-28 | Discharge: 2017-06-28 | Disposition: A | Discharge status: Home / Self Care | Payer: Medicare Other | Attending: Emergency Medicine | Admitting: Emergency Medicine

## 2017-06-28 DIAGNOSIS — R059 Cough, unspecified: Secondary | ICD-10-CM

## 2017-06-28 DIAGNOSIS — Z79899 Other long term (current) drug therapy: Secondary | ICD-10-CM | POA: Insufficient documentation

## 2017-06-28 DIAGNOSIS — I714 Abdominal aortic aneurysm, without rupture, unspecified: Secondary | ICD-10-CM

## 2017-06-28 DIAGNOSIS — F17210 Nicotine dependence, cigarettes, uncomplicated: Secondary | ICD-10-CM | POA: Diagnosis not present

## 2017-06-28 DIAGNOSIS — R109 Unspecified abdominal pain: Secondary | ICD-10-CM | POA: Insufficient documentation

## 2017-06-28 DIAGNOSIS — J449 Chronic obstructive pulmonary disease, unspecified: Secondary | ICD-10-CM | POA: Diagnosis not present

## 2017-06-28 DIAGNOSIS — R102 Pelvic and perineal pain: Secondary | ICD-10-CM | POA: Diagnosis present

## 2017-06-28 DIAGNOSIS — I723 Aneurysm of iliac artery: Secondary | ICD-10-CM | POA: Insufficient documentation

## 2017-06-28 DIAGNOSIS — R05 Cough: Secondary | ICD-10-CM | POA: Diagnosis not present

## 2017-06-28 LAB — CBC WITH DIFFERENTIAL/PLATELET
BASOS ABS: 0 10*3/uL (ref 0.0–0.1)
Basophils Relative: 0 %
Eosinophils Absolute: 0.2 10*3/uL (ref 0.0–0.7)
Eosinophils Relative: 2 %
HEMATOCRIT: 42.3 % (ref 39.0–52.0)
Hemoglobin: 14.3 g/dL (ref 13.0–17.0)
LYMPHS PCT: 16 %
Lymphs Abs: 1.7 10*3/uL (ref 0.7–4.0)
MCH: 30.5 pg (ref 26.0–34.0)
MCHC: 33.8 g/dL (ref 30.0–36.0)
MCV: 90.2 fL (ref 78.0–100.0)
MONO ABS: 0.9 10*3/uL (ref 0.1–1.0)
MONOS PCT: 9 %
NEUTROS ABS: 7.8 10*3/uL — AB (ref 1.7–7.7)
Neutrophils Relative %: 74 %
Platelets: 253 10*3/uL (ref 150–400)
RBC: 4.69 MIL/uL (ref 4.22–5.81)
RDW: 13.4 % (ref 11.5–15.5)
WBC: 10.6 10*3/uL — ABNORMAL HIGH (ref 4.0–10.5)

## 2017-06-28 LAB — COMPREHENSIVE METABOLIC PANEL
ALT: 24 U/L (ref 17–63)
AST: 17 U/L (ref 15–41)
Albumin: 3.5 g/dL (ref 3.5–5.0)
Alkaline Phosphatase: 70 U/L (ref 38–126)
Anion gap: 6 (ref 5–15)
BILIRUBIN TOTAL: 1 mg/dL (ref 0.3–1.2)
BUN: 16 mg/dL (ref 6–20)
CALCIUM: 8.6 mg/dL — AB (ref 8.9–10.3)
CO2: 27 mmol/L (ref 22–32)
CREATININE: 1.46 mg/dL — AB (ref 0.61–1.24)
Chloride: 104 mmol/L (ref 101–111)
GFR calc Af Amer: 51 mL/min — ABNORMAL LOW (ref >60)
GFR, EST NON AFRICAN AMERICAN: 44 mL/min — AB (ref >60)
Glucose, Bld: 94 mg/dL (ref 65–99)
POTASSIUM: 3.8 mmol/L (ref 3.5–5.1)
Sodium: 137 mmol/L (ref 135–145)
TOTAL PROTEIN: 6.4 g/dL — AB (ref 6.5–8.1)

## 2017-06-28 LAB — URINALYSIS, MICROSCOPIC (REFLEX): SQUAMOUS EPITHELIAL / LPF: NONE SEEN

## 2017-06-28 LAB — URINALYSIS, ROUTINE W REFLEX MICROSCOPIC
Bilirubin Urine: NEGATIVE
GLUCOSE, UA: NEGATIVE mg/dL
Ketones, ur: NEGATIVE mg/dL
Nitrite: POSITIVE — AB
PH: 6 (ref 5.0–8.0)
Protein, ur: NEGATIVE mg/dL
Specific Gravity, Urine: <1.005 — ABNORMAL LOW (ref 1.005–1.030)

## 2017-06-28 LAB — LIPASE, BLOOD: LIPASE: 32 U/L (ref 11–51)

## 2017-06-28 MED ORDER — IOPAMIDOL (ISOVUE-370) INJECTION 76%
100.0000 mL | Freq: Once | INTRAVENOUS | Status: AC | PRN
  Administered 2017-06-28: 80 mL via INTRAVENOUS

## 2017-06-28 MED ORDER — SODIUM CHLORIDE 0.9 % IV BOLUS (SEPSIS)
1000.0000 mL | Freq: Once | INTRAVENOUS | Status: AC
  Administered 2017-06-28: 1000 mL via INTRAVENOUS

## 2017-06-28 MED ORDER — ONDANSETRON HCL 4 MG/2ML IJ SOLN
4.0000 mg | Freq: Once | INTRAMUSCULAR | Status: AC
  Administered 2017-06-28: 4 mg via INTRAVENOUS
  Filled 2017-06-28: qty 2

## 2017-06-28 NOTE — ED Provider Notes (Signed)
[facility] EMERGENCY DEPARTMENT Provider Note  CSN: [id number] Arrival date & time: 06/28/17 1522  Chief Complaint(s) Pelvic Pain  HPI David Duke is a 82 y.o. male with an extensive past medical history listed below including BPH requiring indwelling Foley, stroke with residual slurred speech who presents to the emergency department for 1 day of initially intermittent bilateral flank pain that began earlier this morning upon awakening, described as soreness.  Initially he thought that he had slept wrong.  He reports that this lasted anywhere from 30 minutes to an hour and then resolved.  However several hours later the pain returned and has been constant for the past several hours.  Pain is exacerbated with movement.  He denies any trauma, fevers, chills, nausea, vomiting, diarrhea.  He still having bowel movements and passing gas.  He is endorsing clear urine output from the Foley but reports that his output has increased over the past couple days.  He is also endorsing external urethral meatus irritation from the Foley but no bleeding or discharge.  Patient also with a cough that was recently treated for COPD exacerbation with steroids and azithromycin which has improved his cough.  He denies any other physical complaints.  Denies any other alleviating or aggravating factors.  HPI  Past Medical History Past Medical History:  Diagnosis Date  . AKI (acute kidney injury) (HCC)   . Anxiety   . Blind right eye   . BPH (benign prostatic hypertrophy) with urinary retention   . COPD (chronic obstructive pulmonary disease) (HCC)   . Dementia   . Depression   . High cholesterol   . Macular degeneration of both eyes   . Renal disorder   . Shortness of breath dyspnea   . Stroke [facility])    has residual slurred speech   Patient Active Problem List   Diagnosis Date Noted  . Elevated troponin 03/27/2017  . Diplopia 03/27/2017  . Bilateral leg edema 03/27/2017  . Enterocolitis  09/30/2016  . HLD (hyperlipidemia) 09/30/2016  . Tobacco abuse 09/30/2016  . Nausea vomiting and diarrhea 09/30/2016  . History of stroke in prior 3 months 01/22/2016  . Chronic tension-type headache, not intractable   . Acute frontal sinusitis   . 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 Gait disturbance, post-stroke   . Ataxia, late effect of cerebrovascular disease   . BPH (benign prostatic hyperplasia)   . Urinary retention   . Adjustment disorder with mixed anxiety and depressed mood   . Chronic obstructive pulmonary disease (HCC)   . Cephalalgia   . AKI (acute kidney injury) (HCC)   . Macular degeneration   . Prediabetes   . Hypokalemia   . Absolute anemia   . Aphasia   . Acute encephalopathy 11/25/2015  . Hyponatremia 11/25/2015  . Acute kidney injury (HCC) 11/25/2015  . Depression   . Anxiety   . COPD (chronic obstructive pulmonary disease) (HCC)   . ICH (intracerebral hemorrhage) (HCC) 11/22/2015   Home Medication(s) Prior to Admission medications   Medication Sig Start Date End Date Taking? Authorizing Provider  albuterol (PROVENTIL HFA;VENTOLIN HFA) 108 (90 Base) MCG/ACT inhaler Inhale 1-2 puffs into the lungs every 6 (six) hours as needed for wheezing or shortness of breath. 12/28/16   Long, Arlyss Repress, MD  atorvastatin (LIPITOR) 20 MG tablet Take 1 tablet (20 mg total) by mouth daily at 6 PM. Patient not taking: Reported on 03/27/2017 09/03/16   Saguier, Ramon Dredge, PA-C  azithromycin [facility]  Z-PAK) 250 MG tablet 2 po day one, then 1 daily x 4 days 06/21/17   Arthor Captain, PA-C  diazepam (VALIUM) 5 MG tablet Take 1 tablet (5 mg total) by mouth every 8 (eight) hours as needed for anxiety. Can fill no sooner than October 24, 2016. Patient taking differently: Take 5 mg by mouth 4 (four) times daily.  10/20/16   Saguier, Ramon Dredge, PA-C  finasteride (PROSCAR) 5 MG tablet Take 5 mg by mouth daily. 09/22/16   [provider]  fluticasone  (FLONASE) 50 MCG/ACT nasal spray Place 1 spray into both nostrils daily. Patient taking differently: Place 1 spray into both nostrils daily as needed for allergies or rhinitis.  12/11/15   Love, [name], PA-C  guaiFENesin (MUCINEX) 600 MG 12 hr tablet Take 2 tablets (1,200 mg total) by mouth 2 (two) times daily. 06/21/17   Harris, Abigail, PA-C  LORazepam (ATIVAN) 1 MG tablet Take 1 tablet (1 mg total) by mouth 3 (three) times daily as needed for anxiety. 04/13/17   Dione Booze, MD  Multiple Vitamin (MULTIVITAMIN WITH MINERALS) TABS tablet Take 1 tablet by mouth daily.    [provider]  predniSONE (DELTASONE) 20 MG tablet Take 2 tablets (40 mg total) by mouth daily. 06/21/17   Arthor Captain, PA-C                                                                                                                                    Past Surgical History Past Surgical History:  Procedure Laterality Date  . NO PAST SURGERIES     Family History Family History  Problem Relation Age of Onset  . Colon cancer Mother   . Cerebral aneurysm Father     Social History Social History  Substance Use Topics  . Smoking status: Current Every Day Smoker    Packs/day: 0.25    Years: 60.00    Types: Cigarettes  . Smokeless tobacco: Never Used  . Alcohol use No   Allergies Penicillins; Amoxicillin; and Sulfa antibiotics  Review of Systems Review of Systems All other systems are reviewed and are negative for acute change except as noted in the HPI  Physical Exam Vital Signs  I have reviewed the triage vital signs BP (!) 150/79 (BP Location: Right Arm)   Pulse 85   Temp 99.2 F (37.3 C) (Oral)   Resp 18   Ht 5\' 8"  (1.727 m)   Wt 63.5 kg (140 lb)   BMI 21.29 kg/m   Physical Exam  Constitutional: He is oriented to person, place, and time. He appears well-developed and well-nourished. No distress.  HENT:  Head: Normocephalic and atraumatic.  Nose: Nose normal.  Eyes: Pupils are equal,  round, and reactive to light. Conjunctivae and EOM are normal. Right eye exhibits no discharge. Left eye exhibits no discharge. No scleral icterus.  Neck: Normal range of motion. Neck supple.  Cardiovascular: Normal rate and regular  rhythm.  Exam reveals no gallop and no friction rub.   No murmur heard. Pulmonary/Chest: Effort normal and breath sounds normal. No stridor. No respiratory distress. He has no rales.  Abdominal: Soft. He exhibits no distension. There is tenderness in the suprapubic area and left lower quadrant. There is guarding. There is no rigidity, no rebound and no CVA tenderness. No hernia.  Genitourinary:  Genitourinary Comments: Indwelling Foley.  External urethral meatus with mild tear but no active bleeding, erythema, or discharge.  No testicular swelling or pain.  Urine appears to be clear in the leg bag.  Musculoskeletal: He exhibits no edema or tenderness.  Neurological: He is alert and oriented to person, place, and time.  Skin: Skin is warm and dry. No rash noted. He is not diaphoretic. No erythema.  Psychiatric: He has a normal mood and affect.  Vitals reviewed.   ED Results and Treatments Labs (all labs ordered are listed, but only abnormal results are displayed) Labs Reviewed  CBC WITH DIFFERENTIAL/PLATELET - Abnormal; Notable for the following:       Result Value   WBC 10.6 (*)    Neutro Abs 7.8 (*)    All other components within normal limits  COMPREHENSIVE METABOLIC PANEL - Abnormal; Notable for the following:    Creatinine, Ser 1.46 (*)    Calcium 8.6 (*)    Total Protein 6.4 (*)    GFR calc non Af Amer 44 (*)    GFR calc Af Amer 51 (*)    All other components within normal limits  URINALYSIS, ROUTINE W REFLEX MICROSCOPIC - Abnormal; Notable for the following:    Color, Urine STRAW (*)    Specific Gravity, Urine <1.005 (*)    Hgb urine dipstick MODERATE (*)    Nitrite POSITIVE (*)    Leukocytes, UA MODERATE (*)    All other components within  normal limits  URINALYSIS, MICROSCOPIC (REFLEX) - Abnormal; Notable for the following:    Bacteria, UA MANY (*)    All other components within normal limits  URINE CULTURE  LIPASE, BLOOD                                                                                                                         EKG  EKG Interpretation  Date/Time:    Ventricular Rate:    PR Interval:    QRS Duration:   QT Interval:    QTC Calculation:   R Axis:     Text Interpretation:        Radiology Ct Angio Chest Aorta W And/or Wo Contrast  Result Date: 06/28/2017 CLINICAL DATA:  Productive cough. Diagnosed with urinary tract infection over the past several weeks. Abdominal aortic aneurysm. EXAM: CT ANGIOGRAPHY CHEST, ABDOMEN AND PELVIS TECHNIQUE: Multidetector CT imaging through the chest, abdomen and pelvis was performed using the standard protocol during bolus administration of intravenous contrast. Multiplanar reconstructed images and MIPs were obtained and reviewed to evaluate the vascular anatomy. CONTRAST:  80  cc Isovue 370 IV COMPARISON:  06/21/2017 CXR, CT abdomen and pelvis 03/20/2017 and 07/01/2017. FINDINGS: CTA CHEST FINDINGS Cardiovascular: There is good opacification of the thoracic aorta and pulmonary arteries. No evidence of thoracic aortic aneurysm or dissection. Mild atherosclerosis of the thoracic aorta with uncoiling. No acute pulmonary embolus to the segmental level. Normal heart size. No pericardial effusion. Minimal coronary arteriosclerosis along the LAD. Mediastinum/Nodes: No enlarged mediastinal, hilar, or axillary lymph nodes. Thyroid gland, trachea, and esophagus demonstrate no significant findings. Lungs/Pleura: A noncalcified 9 mm posterior segment right upper lobe pulmonary nodule is noted. Streaky linear atelectasis or scarring is noted in both lower lobes. Musculoskeletal: No chest wall abnormality. No acute or significant osseous findings. Degenerative changes are seen along  the thoracic spine. Review of the MIP images confirms the above findings. CTA ABDOMEN AND PELVIS FINDINGS VASCULAR Aorta: Infrarenal 4.2 cm fusiform abdominal aortic aneurysm with circumferential mural plaquing and ulcerated soft plaque anteriorly. No significant change dating back to 10/12/2016 comparison. No evidence of dissection or leak. No significant stenosis. Celiac: The celiac axis is patent with slight spinal less than 50% luminal narrowing proximally, series 5, image 91. No aneurysm, dissection or significant stenosis. SMA: Patent without evidence of aneurysm, dissection, vasculitis or significant stenosis. Renals: Single renal artery to the right kidney and tubal left-sided renal arteries without aneurysm or significant stenosis. IMA: Patent without evidence of aneurysm, dissection, vasculitis or significant stenosis. Inflow: 2 cm distal left common iliac artery aneurysm just proximal to the bifurcation of the internal and external iliacs with chronic fenestrated dissection within. No significant stenosis. Veins: No obvious venous abnormality within the limitations of this arterial phase study. Review of the MIP images confirms the above findings. NON-VASCULAR Hepatobiliary: Stable subcentimeter hypodensities within the liver, statistically more likely to represent cysts or hemangiomata. No biliary dilatation. The gallbladder is physiologically distended without calculi. Pancreas: Scattered calcifications of the pancreatic gland may reflect stigmata of chronic pancreatitis. There is slight nonspecific enlargement of the pancreatic head without discrete mass, similar in appearance to prior. No acute inflammation. Spleen: Normal in size without focal abnormality. Adrenals/Urinary Tract: Normal bilateral adrenal glands. Thinning of the renal cortices bilaterally, chronic in appearance with stable subcentimeter hypodensities consistent with simple cysts of the left kidney. The largest cyst however is  interpolar measuring 2.1 cm and is not a simple cyst. No significant change in its appearance however is noted. A complex cysts might account for this. Close attention on follow-up is recommended or nonemergent ultrasound for better characterization. No mural nodularity or thick septations associated with this lesion are noted. Stomach/Bowel: Stomach is within normal limits. Fluid-filled mild dilatation of the proximal duodenum as before. Appendix appears normal. No evidence of bowel wall thickening, distention, or inflammatory changes. There is colonic diverticulosis along the descending sigmoid colon with circular muscle hypertrophy. Increased colonic stool burden is noted. Lymphatic: No lymphadenopathy. Reproductive: Enlarged lobular appearance of the prostate. Other: Fat within both inguinal canals. No free air, free fluid or intra-abdominal fluid collection. Musculoskeletal: No acute nor suspicious osseous abnormalities. Review of the MIP images confirms the above findings. IMPRESSION: 1. Minimal coronary arteriosclerosis along the LAD. 2. Mild thoracic aortic atherosclerosis without aneurysm or dissection. 3. No acute pulmonary embolus. 4. Chronic stable infrarenal fusiform abdominal aortic aneurysm without leak or interval enlargement, currently estimated at 4.2 cm in diameter. 5. 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 No leak. 6. Patent  branch vessels off the aorta as above. Nonvascular: 1. Colonic diverticulosis without acute diverticulitis. 2. Complex left interpolar renal cyst measuring 2.1 cm in maximum dimension, unchanged from prior. Further characterization could be performed with ultrasound or close attention on follow-up. Nonemergent CT or MRI without and with contrast using renal protocol may help for further correlation as well. 3. Enlarged lobular appearing prostate. 4. Nonspecific mild  enlargement of the pancreatic head without discrete enhancing mass nor ductal dilatation. Electronically Signed   By: Tollie Eth M.D.   On: 06/28/2017 19:17   Ct Angio Abd/pel W And/or Wo Contrast  Result Date: 06/28/2017 CLINICAL DATA:  Productive cough. Diagnosed with urinary tract infection over the past several weeks. Abdominal aortic aneurysm. EXAM: CT ANGIOGRAPHY CHEST, ABDOMEN AND PELVIS TECHNIQUE: Multidetector CT imaging through the chest, abdomen and pelvis was performed using the standard protocol during bolus administration of intravenous contrast. Multiplanar reconstructed images and MIPs were obtained and reviewed to evaluate the vascular anatomy. CONTRAST:  80 cc Isovue 370 IV COMPARISON:  06/21/2017 CXR, CT abdomen and pelvis 03/20/2017 and 07/01/2017. FINDINGS: CTA CHEST FINDINGS Cardiovascular: There is good opacification of the thoracic aorta and pulmonary arteries. No evidence of thoracic aortic aneurysm or dissection. Mild atherosclerosis of the thoracic aorta with uncoiling. No acute pulmonary embolus to the segmental level. Normal heart size. No pericardial effusion. Minimal coronary arteriosclerosis along the LAD. Mediastinum/Nodes: No enlarged mediastinal, hilar, or axillary lymph nodes. Thyroid gland, trachea, and esophagus demonstrate no significant findings. Lungs/Pleura: A noncalcified 9 mm posterior segment right upper lobe pulmonary nodule is noted. Streaky linear atelectasis or scarring is noted in both lower lobes. Musculoskeletal: No chest wall abnormality. No acute or significant osseous findings. Degenerative changes are seen along the thoracic spine. Review of the MIP images confirms the above findings. CTA ABDOMEN AND PELVIS FINDINGS VASCULAR Aorta: Infrarenal 4.2 cm fusiform abdominal aortic aneurysm with circumferential mural plaquing and ulcerated soft plaque anteriorly. No significant change dating back to 10/12/2016 comparison. No evidence of dissection or leak. No  significant stenosis. Celiac: The celiac axis is patent with slight spinal less than 50% luminal narrowing proximally, series 5, image 91. No aneurysm, dissection or significant stenosis. SMA: Patent without evidence of aneurysm, dissection, vasculitis or significant stenosis. Renals: Single renal artery to the right kidney and tubal left-sided renal arteries without aneurysm or significant stenosis. IMA: Patent without evidence of aneurysm, dissection, vasculitis or significant stenosis. Inflow: 2 cm distal left common iliac artery aneurysm just proximal to the bifurcation of the internal and external iliacs with chronic fenestrated dissection within. No significant stenosis. Veins: No obvious venous abnormality within the limitations of this arterial phase study. Review of the MIP images confirms the above findings. NON-VASCULAR Hepatobiliary: Stable subcentimeter hypodensities within the liver, statistically more likely to represent cysts or hemangiomata. No biliary dilatation. The gallbladder is physiologically distended without calculi. Pancreas: Scattered calcifications of the pancreatic gland may reflect stigmata of chronic pancreatitis. There is slight nonspecific enlargement of the pancreatic head without discrete mass, similar in appearance to prior. No acute inflammation. Spleen: Normal in size without focal abnormality. Adrenals/Urinary Tract: Normal bilateral adrenal glands. Thinning of the renal cortices bilaterally, chronic in appearance with stable subcentimeter hypodensities consistent with simple cysts of the left kidney. The largest cyst however is interpolar measuring 2.1 cm and is not a simple cyst. No significant change in its appearance however is noted. A complex cysts might account for this. Close attention on follow-up is recommended or nonemergent ultrasound for better  characterization. No mural nodularity or thick septations associated with this lesion are noted. Stomach/Bowel: Stomach is  within normal limits. Fluid-filled mild dilatation of the proximal duodenum as before. Appendix appears normal. No evidence of bowel wall thickening, distention, or inflammatory changes. There is colonic diverticulosis along the descending sigmoid colon with circular muscle hypertrophy. Increased colonic stool burden is noted. Lymphatic: No lymphadenopathy. Reproductive: Enlarged lobular appearance of the prostate. Other: Fat within both inguinal canals. No free air, free fluid or intra-abdominal fluid collection. Musculoskeletal: No acute nor suspicious osseous abnormalities. Review of the MIP images confirms the above findings. IMPRESSION: 1. Minimal coronary arteriosclerosis along the LAD. 2. Mild thoracic aortic atherosclerosis without aneurysm or dissection. 3. No acute pulmonary embolus. 4. Chronic stable infrarenal fusiform abdominal aortic aneurysm without leak or interval enlargement, currently estimated at 4.2 cm in diameter. 5. 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 No leak. 6. Patent branch vessels off the aorta as above. Nonvascular: 1. Colonic diverticulosis without acute diverticulitis. 2. Complex left interpolar renal cyst measuring 2.1 cm in maximum dimension, unchanged from prior. Further characterization could be performed with ultrasound or close attention on follow-up. Nonemergent CT or MRI without and with contrast using renal protocol may help for further correlation as well. 3. Enlarged lobular appearing prostate. 4. Nonspecific mild enlargement of the pancreatic head without discrete enhancing mass nor ductal dilatation. Electronically Signed   By: Tollie Eth M.D.   On: 06/28/2017 19:17   Pertinent labs & imaging results that were available during my care of the patient were reviewed by me and considered in my medical decision making (see chart for details).  Medications Ordered in  ED Medications  sodium chloride 0.9 % bolus 1,000 mL (0 mLs Intravenous Stopped 06/28/17 1822)  ondansetron (ZOFRAN) injection 4 mg (4 mg Intravenous Given 06/28/17 1649)  iopamidol (ISOVUE-370) 76 % injection 100 mL (80 mLs Intravenous Contrast Given 06/28/17 1748)                                                                                                                                    Procedures Procedures EMERGENCY DEPARTMENT US RENAL EXAM  "Study: Limited Retroperitoneal Ultrasound of Kidneys"  INDICATIONS: Flank pain and Back pain Long and short axis of both kidneys were obtained.   PERFORMED BY: Myself IMAGES ARCHIVED?: Yes LIMITATIONS: None VIEWS USED: Long axis, Short axis and Bladder  INTERPRETATION: No Hydronephrosis, Left Renal cyst, No Kidney stone   EMERGENCY DEPARTMENT ULTRASOUND  Study: Limited Retroperitoneal Ultrasound of the Abdominal Aorta.  INDICATIONS:Abdominal pain, Back pain and Age>55 Multiple views of the abdominal aorta were obtained in real-time from the diaphragmatic hiatus to the aortic bifurcation in transverse planes with a multi-frequency probe.  PERFORMED BY: Myself IMAGES ARCHIVED?: Yes LIMITATIONS:  None INTERPRETATION:  Abdominal aortic aneurysm present - diameter dimensions 3.95 x 4.13cm     (  including critical care time)  Medical Decision Making / ED Course I have reviewed the nursing notes for this encounter and the patient's prior records (if available in EHR or on provided paperwork).    On review of the patient's records, a CT scan from July of this year noted a 4.1 cm AAA.  Patient and family were not aware of this.  I informed him of the incidental finding and reported that I did not see any change in the ultrasound today.  No evidence of hydronephrosis on exam.  Foley is draining appropriately.  UA consistent with prior urinalysis.  Will send for culture for speciation.   Labs with mild leukocytosis which is improved  from 2 days ago.  Renal function at patient's baseline.  CT angiogram was obtained to better assess the patient's aortic aneurysm, also obtain to rule out diverticulitis given the left lower quadrant abdominal tenderness to palpation.  CT of the abdomen without any acute findings.  Patient was made aware of all the incidental findings.  The patient was highly concerned about his AAA and his left iliac artery aneurysm.  He inquired about possible surgeries.  He was informed that given the size of the aneurysm, emergent surgery was not indicated at this time.  Patient still would like to discuss this with vascular surgeon.  He was given the information for vascular surgery and instructed to contact them for further discussions.  He reports that his abdominal discomfort has now completely resolved after passing gas.  His abdomen is currently benign on exam.  I will not treat the patient for urinary tract infection at this time as there is no evidence of fevers or worsening leukocytosis, and his abdominal discomfort has now resolved.    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 Discharge instructions discussed at great length. The patient and wife were given strict return precautions who verbalized understanding of the instructions. No further questions at time of discharge.    Discharge Medication List as of 06/28/2017  8:27 PM      Follow Up: Urology     Nada Libman, MD [address] [place] [address] [phone]  Call  To discuss surgical options for your abdominal aortic aneurysm in your left common iliac artery aneurysm if needed.       This chart  was dictated using voice recognition software.  Despite best efforts to proofread,  errors can occur which can change the documentation meaning.   Nira Conn, MD 06/28/17 2039

## 2017-06-28 NOTE — ED Notes (Signed)
Pt and wife states that he was here last weekend and dx'd with URI. Rx'd antibiotics and prednisone which helped. Now c/o low back pain and pain around foley insertion site. Urine appears clear in leg bag.

## 2017-06-28 NOTE — ED Triage Notes (Signed)
Patient states that he is here today for lower pelvic pain and bilateral lower "kideny pain" - patient has a catheter and reports " I am peeing all the time"  - patient reports that he has pressure in his lower pelvic region

## 2017-06-28 NOTE — Discharge Instructions (Signed)
Here are the findings from our diagnostic imaging today.   1. Minimal coronary arteriosclerosis along the LAD. 2. Mild thoracic aortic atherosclerosis without aneurysm or dissection. 3. No acute pulmonary embolus. 4. Chronic stable infrarenal fusiform abdominal aortic aneurysm without leak or interval enlargement, currently estimated at 4.2 cm in diameter. 5. 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 No leak. 6. Patent branch vessels off the aorta as above. Nonvascular:  1. Colonic diverticulosis without acute diverticulitis. 2. Complex left interpolar renal cyst measuring 2.1 cm in maximum dimension, unchanged from prior. Further characterization could be performed with ultrasound or close attention on follow-up. Nonemergent CT or MRI without and with contrast using renal protocol may help for further correlation as well. 3. Enlarged lobular appearing prostate. 4. Nonspecific mild enlargement of the pancreatic head without discrete enhancing mass nor ductal dilatation.

## 2017-06-30 LAB — URINE CULTURE

## 2017-08-11 ENCOUNTER — Encounter (HOSPITAL_BASED_OUTPATIENT_CLINIC_OR_DEPARTMENT_OTHER): Payer: Self-pay | Admitting: *Deleted

## 2017-08-11 ENCOUNTER — Emergency Department (HOSPITAL_BASED_OUTPATIENT_CLINIC_OR_DEPARTMENT_OTHER)
Admission: EM | Admit: 2017-08-11 | Discharge: 2017-08-11 | Disposition: A | Discharge status: Home / Self Care | Payer: Medicare Other | Attending: Emergency Medicine | Admitting: Emergency Medicine

## 2017-08-11 ENCOUNTER — Other Ambulatory Visit: Payer: Self-pay

## 2017-08-11 ENCOUNTER — Emergency Department (HOSPITAL_BASED_OUTPATIENT_CLINIC_OR_DEPARTMENT_OTHER): Payer: Medicare Other

## 2017-08-11 DIAGNOSIS — Z79899 Other long term (current) drug therapy: Secondary | ICD-10-CM | POA: Diagnosis not present

## 2017-08-11 DIAGNOSIS — R0602 Shortness of breath: Secondary | ICD-10-CM | POA: Diagnosis present

## 2017-08-11 DIAGNOSIS — F17210 Nicotine dependence, cigarettes, uncomplicated: Secondary | ICD-10-CM | POA: Insufficient documentation

## 2017-08-11 DIAGNOSIS — J441 Chronic obstructive pulmonary disease with (acute) exacerbation: Secondary | ICD-10-CM | POA: Diagnosis not present

## 2017-08-11 DIAGNOSIS — F0390 Unspecified dementia without behavioral disturbance: Secondary | ICD-10-CM | POA: Insufficient documentation

## 2017-08-11 LAB — BASIC METABOLIC PANEL
ANION GAP: 7 (ref 5–15)
BUN: 11 mg/dL (ref 6–20)
CALCIUM: 9.1 mg/dL (ref 8.9–10.3)
CO2: 27 mmol/L (ref 22–32)
CREATININE: 1.41 mg/dL — AB (ref 0.61–1.24)
Chloride: 104 mmol/L (ref 101–111)
GFR calc non Af Amer: 45 mL/min — ABNORMAL LOW (ref >60)
GFR, EST AFRICAN AMERICAN: 53 mL/min — AB (ref >60)
Glucose, Bld: 103 mg/dL — ABNORMAL HIGH (ref 65–99)
Potassium: 3.7 mmol/L (ref 3.5–5.1)
Sodium: 138 mmol/L (ref 135–145)

## 2017-08-11 LAB — CBC WITH DIFFERENTIAL/PLATELET
BASOS ABS: 0.1 10*3/uL (ref 0.0–0.1)
BASOS PCT: 1 %
EOS ABS: 0.5 10*3/uL (ref 0.0–0.7)
Eosinophils Relative: 6 %
HEMATOCRIT: 42.7 % (ref 39.0–52.0)
HEMOGLOBIN: 14.4 g/dL (ref 13.0–17.0)
Lymphocytes Relative: 19 %
Lymphs Abs: 1.6 10*3/uL (ref 0.7–4.0)
MCH: 30.4 pg (ref 26.0–34.0)
MCHC: 33.7 g/dL (ref 30.0–36.0)
MCV: 90.1 fL (ref 78.0–100.0)
MONO ABS: 0.6 10*3/uL (ref 0.1–1.0)
MONOS PCT: 7 %
NEUTROS ABS: 5.6 10*3/uL (ref 1.7–7.7)
NEUTROS PCT: 67 %
Platelets: 238 10*3/uL (ref 150–400)
RBC: 4.74 MIL/uL (ref 4.22–5.81)
RDW: 13.5 % (ref 11.5–15.5)
WBC: 8.3 10*3/uL (ref 4.0–10.5)

## 2017-08-11 LAB — TROPONIN I

## 2017-08-11 MED ORDER — PREDNISONE 50 MG PO TABS
60.0000 mg | ORAL_TABLET | Freq: Once | ORAL | Status: AC
  Administered 2017-08-11: 14:00:00 60 mg via ORAL
  Filled 2017-08-11: qty 1

## 2017-08-11 MED ORDER — PREDNISONE 20 MG PO TABS: ORAL_TABLET | ORAL | 0 refills | Status: DC

## 2017-08-11 MED ORDER — ALBUTEROL SULFATE (2.5 MG/3ML) 0.083% IN NEBU
2.5000 mg | INHALATION_SOLUTION | Freq: Once | RESPIRATORY_TRACT | Status: AC
  Administered 2017-08-11: 2.5 mg via RESPIRATORY_TRACT
  Filled 2017-08-11: qty 3

## 2017-08-11 MED ORDER — IPRATROPIUM-ALBUTEROL 0.5-2.5 (3) MG/3ML IN SOLN
3.0000 mL | Freq: Once | RESPIRATORY_TRACT | Status: AC
  Administered 2017-08-11: 3 mL via RESPIRATORY_TRACT
  Filled 2017-08-11: qty 3

## 2017-08-11 MED FILL — predniSONE 20 MG TABS: 20 | 4 days supply | Qty: 8 | Fill #0

## 2017-08-11 NOTE — ED Triage Notes (Signed)
States he is having a COPD flare up. His medications were delivered yesterday but no nebulizer was sent so he has missed his dose. He has used his rescue inhaler. While he is here he wants his foley catheter changed as it is past due.

## 2017-08-11 NOTE — ED Provider Notes (Signed)
[facility] EMERGENCY DEPARTMENT Provider Note   CSN: [id number] Arrival date & time: 08/11/17  1239     History   Chief Complaint Chief Complaint  Patient presents with  . Shortness of Breath    HPI David Duke is a 82 y.o. male.  82 yo M with a chief complaint of shortness of breath.  This been going on for the past week or so.  The patient has been having increased cough and sputum production.  There is no change in his sputum.  The patient has a history of COPD.  He lost his nebulizer machine.  He thinks maybe this is why it is worse.  He also would like his Foley catheter routinely changed.  He states that he is having trouble getting into see the urologist because he is having trouble with transportation at home.  He states he normally has transportation on the weekend but usually the office is not open at that time.  He has been sleeping in a chair because he does not like how it feels to have the catheter dangling off the bed.  Since then he has had some worsening lower extremity edema.   The history is provided by the patient.  Shortness of Breath  This is a chronic problem. The average episode lasts 2 weeks. The problem occurs continuously.The current episode started more than 1 week ago. The problem has been rapidly worsening. Associated symptoms include cough. Pertinent negatives include no fever, no headaches, no chest pain, no vomiting, no abdominal pain and no rash. He has tried nothing for the symptoms. The treatment provided no relief. He has had prior hospitalizations. He has had prior ED visits. Associated medical issues include COPD. Associated medical issues do not include PE.    Past Medical History:  Diagnosis Date  . AKI (acute kidney injury) (HCC)   . Anxiety   . Blind right eye   . BPH (benign prostatic hypertrophy) with urinary retention   . COPD (chronic obstructive pulmonary disease) (HCC)   . Dementia   . Depression   . High  cholesterol   . Macular degeneration of both eyes   . Renal disorder   . Shortness of breath dyspnea   . Stroke [facility](HCC)    has residual slurred speech    Patient Active Problem List   Diagnosis Date Noted  . Elevated troponin 03/27/2017  . Diplopia 03/27/2017  . Bilateral leg edema 03/27/2017  . Enterocolitis 09/30/2016  . HLD (hyperlipidemia) 09/30/2016  . Tobacco abuse 09/30/2016  . Nausea vomiting and diarrhea 09/30/2016  . History of stroke in prior 3 months 01/22/2016  . Chronic tension-type headache, not intractable   . Acute frontal sinusitis   . 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 Gait disturbance, post-stroke   . Ataxia, late effect of cerebrovascular disease   . BPH (benign prostatic hyperplasia)   . Urinary retention   . Adjustment disorder with mixed anxiety and depressed mood   . Chronic obstructive pulmonary disease (HCC)   . Cephalalgia   . AKI (acute kidney injury) (HCC)   . Macular degeneration   . Prediabetes   . Hypokalemia   . Absolute anemia   . Aphasia   . Acute encephalopathy 11/25/2015  . Hyponatremia 11/25/2015  . Acute kidney injury (HCC) 11/25/2015  . Depression   . Anxiety   . COPD (chronic obstructive pulmonary disease) (HCC)   . ICH (intracerebral hemorrhage) (HCC) 11/22/2015  Past Surgical History:  Procedure Laterality Date  . NO PAST SURGERIES         Home Medications    Prior to Admission medications   Medication Sig Start Date End Date Taking? Authorizing Provider  albuterol (PROVENTIL HFA;VENTOLIN HFA) 108 (90 Base) MCG/ACT inhaler Inhale 1-2 puffs into the lungs every 6 (six) hours as needed for wheezing or shortness of breath. 12/28/16   Long, Arlyss Repress, MD  atorvastatin (LIPITOR) 20 MG tablet Take 1 tablet (20 mg total) by mouth daily at 6 PM. Patient not taking: Reported on 03/27/2017 09/03/16   Saguier, Ramon Dredge, PA-C  azithromycin (ZITHROMAX Z-PAK) 250 MG tablet 2 po day one, then 1 daily x 4  days 06/21/17   Arthor Captain, PA-C  diazepam (VALIUM) 5 MG tablet Take 1 tablet (5 mg total) by mouth every 8 (eight) hours as needed for anxiety. Can fill no sooner than October 24, 2016. Patient taking differently: Take 5 mg by mouth 4 (four) times daily.  10/20/16   Saguier, Ramon Dredge, PA-C  finasteride (PROSCAR) 5 MG tablet Take 5 mg by mouth daily. 09/22/16   [provider]  fluticasone (FLONASE) 50 MCG/ACT nasal spray Place 1 spray into both nostrils daily. Patient taking differently: Place 1 spray into both nostrils daily as needed for allergies or rhinitis.  12/11/15   Love, [name], PA-C  guaiFENesin (MUCINEX) 600 MG 12 hr tablet Take 2 tablets (1,200 mg total) by mouth 2 (two) times daily. 06/21/17   Harris, Abigail, PA-C  LORazepam (ATIVAN) 1 MG tablet Take 1 tablet (1 mg total) by mouth 3 (three) times daily as needed for anxiety. 04/13/17   Dione Booze, MD  Multiple Vitamin (MULTIVITAMIN WITH MINERALS) TABS tablet Take 1 tablet by mouth daily.    [provider]  predniSONE (DELTASONE) 20 MG tablet 2 tabs po daily x 4 days 08/11/17   Melene Plan, DO    Family History Family History  Problem Relation Age of Onset  . Colon cancer Mother   . Cerebral aneurysm Father     Social History Social History   Tobacco Use  . Smoking status: Current Every Day Smoker    Packs/day: 0.25    Years: 60.00    Pack years: 15.00    Types: Cigarettes  . Smokeless tobacco: Never Used  Substance Use Topics  . Alcohol use: No  . Drug use: No     Allergies   Penicillins; Amoxicillin; and Sulfa antibiotics   Review of Systems Review of Systems  Constitutional: Negative for chills and fever.  HENT: Positive for congestion. Negative for facial swelling.   Eyes: Negative for discharge and visual disturbance.  Respiratory: Positive for cough and shortness of breath.   Cardiovascular: Negative for chest pain and palpitations.  Gastrointestinal: Negative for abdominal pain,  diarrhea and vomiting.  Musculoskeletal: Negative for arthralgias and myalgias.  Skin: Negative for color change and rash.  Neurological: Negative for tremors, syncope and headaches.  Psychiatric/Behavioral: Negative for confusion and dysphoric mood.     Physical Exam Updated Vital Signs BP (!) 154/95 (BP Location: Right Arm)   Pulse 96   Temp 98 F (36.7 C) (Oral)   Resp (!) 26   Wt 63.5 kg (140 lb)   SpO2 95%   BMI 21.29 kg/m   Physical Exam  Constitutional: He is oriented to person, place, and time. He appears well-developed and well-nourished.  HENT:  Head: Normocephalic and atraumatic.  Eyes: EOM are normal. Pupils are equal, round,  and reactive to light.  Neck: Normal range of motion. Neck supple. No JVD present.  Cardiovascular: Normal rate and regular rhythm. Exam reveals no gallop and no friction rub.  No murmur heard. Pulmonary/Chest: No respiratory distress. He has no wheezes.  Abdominal: He exhibits no distension. There is no rebound and no guarding.  Musculoskeletal: Normal range of motion.       Right lower leg: He exhibits edema (2+ bilaterally).       Left lower leg: He exhibits edema (2+).  Neurological: He is alert and oriented to person, place, and time.  Skin: No rash noted. No pallor.  Psychiatric: He has a normal mood and affect. His behavior is normal.  Nursing note and vitals reviewed.    ED Treatments / Results  Labs (all labs ordered are listed, but only abnormal results are displayed) Labs Reviewed  BASIC METABOLIC PANEL - Abnormal; Notable for the following components:      Result Value   Glucose, Bld 103 (*)    Creatinine, Ser 1.41 (*)    GFR calc non Af Amer 45 (*)    GFR calc Af Amer 53 (*)    All other components within normal limits  CBC WITH DIFFERENTIAL/PLATELET  TROPONIN I    EKG  EKG Interpretation None       Radiology Dg Chest 2 View  Result Date: 08/11/2017 CLINICAL DATA:  Cough and wheezing for the past 2 weeks.  Current smoker. EXAM: CHEST  2 VIEW COMPARISON:  Chest x-ray of June 21, 2017 and chest CT scan of June 28, 2017. FINDINGS: The lungs are are well-expanded with hemidiaphragm flattening. There is no alveolar infiltrate or pleural effusion. The heart and pulmonary vascularity are normal. There is calcification in the wall of the thoracic aorta and tortuosity of its course. There is no pleural effusion. There is moderate levocurvature centered in the lower thoracic spine. IMPRESSION: Chronic bronchitic changes, stable.  No acute pneumonia nor CHF. Thoracic aortic atherosclerosis. Electronically Signed   By: Gorge  [place] M.D.   On: 08/11/2017 13:43    Procedures Procedures (including critical care time)  Medications Ordered in ED Medications  albuterol (PROVENTIL) (2.5 MG/3ML) 0.083% nebulizer solution 2.5 mg (2.5 mg Nebulization Given 08/11/17 1307)  ipratropium-albuterol (DUONEB) 0.5-2.5 (3) MG/3ML nebulizer solution 3 mL (3 mLs Nebulization Given 08/11/17 1307)  predniSONE (DELTASONE) tablet 60 mg (60 mg Oral Given 08/11/17 1346)     Initial Impression / Assessment and Plan / ED Course  I have reviewed the triage vital signs and the nursing notes.  Pertinent labs & imaging results that were available during my care of the patient were reviewed by me and considered in my medical decision making (see chart for details).     82 yo M with a chief complaint of shortness of breath.  Patient has diffuse wheezes and prolonged expiration.  Improved with breathing treatments and steroids.  Started on burst of steroids.  Likely a COPD exacerbation by history.  He does have some dependent edema that is likely due to him sleeping in a recliner.  We will have him follow-up with his PCP and urologist.  4:01 PM:  I have discussed the diagnosis/risks/treatment options with the patient and family and believe the pt to be eligible for discharge home to follow-up with PCP, urology. We also discussed returning  to the ED immediately if new or worsening sx occur. We discussed the sx which are most concerning (e.g., sudden worsening sob, fever, inability to tolerate  by mouth) that necessitate immediate return. Medications administered to the patient during their visit and any new prescriptions provided to the patient are listed below.  Medications given during this visit Medications  albuterol (PROVENTIL) (2.5 MG/3ML) 0.083% nebulizer solution 2.5 mg (2.5 mg Nebulization Given 08/11/17 1307)  ipratropium-albuterol (DUONEB) 0.5-2.5 (3) MG/3ML nebulizer solution 3 mL (3 mLs Nebulization Given 08/11/17 1307)  predniSONE (DELTASONE) tablet 60 mg (60 mg Oral Given 08/11/17 1346)     The patient appears reasonably screen and/or stabilized for discharge and I doubt any other medical condition or other [facility] requiring further screening, evaluation, or treatment in the ED at this time prior to discharge.    Final Clinical Impressions(s) / ED Diagnoses   Final diagnoses:  COPD exacerbation [facility](HCC)    ED Discharge Orders        Ordered    predniSONE (DELTASONE) 20 MG tablet     08/11/17 1357       Melene PlanFloyd, Enmanuel Zufall, DO 08/11/17 1601

## 2017-08-23 ENCOUNTER — Other Ambulatory Visit: Payer: Self-pay

## 2017-08-23 ENCOUNTER — Emergency Department (HOSPITAL_BASED_OUTPATIENT_CLINIC_OR_DEPARTMENT_OTHER): Payer: Medicare Other

## 2017-08-23 ENCOUNTER — Emergency Department (HOSPITAL_BASED_OUTPATIENT_CLINIC_OR_DEPARTMENT_OTHER)
Admission: EM | Admit: 2017-08-23 | Discharge: 2017-08-23 | Disposition: A | Discharge status: Home / Self Care | Payer: Medicare Other | Attending: Emergency Medicine | Admitting: Emergency Medicine

## 2017-08-23 ENCOUNTER — Encounter (HOSPITAL_BASED_OUTPATIENT_CLINIC_OR_DEPARTMENT_OTHER): Payer: Self-pay | Admitting: *Deleted

## 2017-08-23 DIAGNOSIS — Z79899 Other long term (current) drug therapy: Secondary | ICD-10-CM | POA: Diagnosis not present

## 2017-08-23 DIAGNOSIS — F17210 Nicotine dependence, cigarettes, uncomplicated: Secondary | ICD-10-CM | POA: Diagnosis not present

## 2017-08-23 DIAGNOSIS — R1031 Right lower quadrant pain: Secondary | ICD-10-CM | POA: Insufficient documentation

## 2017-08-23 DIAGNOSIS — M25511 Pain in right shoulder: Secondary | ICD-10-CM | POA: Insufficient documentation

## 2017-08-23 DIAGNOSIS — Z466 Encounter for fitting and adjustment of urinary device: Secondary | ICD-10-CM | POA: Insufficient documentation

## 2017-08-23 DIAGNOSIS — J449 Chronic obstructive pulmonary disease, unspecified: Secondary | ICD-10-CM | POA: Insufficient documentation

## 2017-08-23 DIAGNOSIS — Y929 Unspecified place or not applicable: Secondary | ICD-10-CM | POA: Diagnosis not present

## 2017-08-23 DIAGNOSIS — W19XXXA Unspecified fall, initial encounter: Secondary | ICD-10-CM

## 2017-08-23 DIAGNOSIS — Y9389 Activity, other specified: Secondary | ICD-10-CM | POA: Diagnosis not present

## 2017-08-23 DIAGNOSIS — R0789 Other chest pain: Secondary | ICD-10-CM | POA: Insufficient documentation

## 2017-08-23 DIAGNOSIS — F0390 Unspecified dementia without behavioral disturbance: Secondary | ICD-10-CM | POA: Insufficient documentation

## 2017-08-23 DIAGNOSIS — W000XXA Fall on same level due to ice and snow, initial encounter: Secondary | ICD-10-CM | POA: Insufficient documentation

## 2017-08-23 DIAGNOSIS — Y998 Other external cause status: Secondary | ICD-10-CM | POA: Diagnosis not present

## 2017-08-23 LAB — COMPREHENSIVE METABOLIC PANEL
ALBUMIN: 3.4 g/dL — AB (ref 3.5–5.0)
ALT: 19 U/L (ref 17–63)
ANION GAP: 7 (ref 5–15)
AST: 22 U/L (ref 15–41)
Alkaline Phosphatase: 79 U/L (ref 38–126)
BUN: 11 mg/dL (ref 6–20)
CHLORIDE: 106 mmol/L (ref 101–111)
CO2: 26 mmol/L (ref 22–32)
Calcium: 8.8 mg/dL — ABNORMAL LOW (ref 8.9–10.3)
Creatinine, Ser: 1.38 mg/dL — ABNORMAL HIGH (ref 0.61–1.24)
GFR calc non Af Amer: 47 mL/min — ABNORMAL LOW (ref >60)
GFR, EST AFRICAN AMERICAN: 54 mL/min — AB (ref >60)
GLUCOSE: 99 mg/dL (ref 65–99)
Potassium: 3.8 mmol/L (ref 3.5–5.1)
SODIUM: 139 mmol/L (ref 135–145)
Total Bilirubin: 0.6 mg/dL (ref 0.3–1.2)
Total Protein: 6.7 g/dL (ref 6.5–8.1)

## 2017-08-23 LAB — CBC WITH DIFFERENTIAL/PLATELET
BASOS PCT: 1 %
Basophils Absolute: 0.1 10*3/uL (ref 0.0–0.1)
EOS ABS: 0.4 10*3/uL (ref 0.0–0.7)
EOS PCT: 4 %
HCT: 40.5 % (ref 39.0–52.0)
HEMOGLOBIN: 13.5 g/dL (ref 13.0–17.0)
LYMPHS ABS: 1.3 10*3/uL (ref 0.7–4.0)
Lymphocytes Relative: 13 %
MCH: 30.5 pg (ref 26.0–34.0)
MCHC: 33.3 g/dL (ref 30.0–36.0)
MCV: 91.4 fL (ref 78.0–100.0)
Monocytes Absolute: 0.6 10*3/uL (ref 0.1–1.0)
Monocytes Relative: 6 %
NEUTROS PCT: 76 %
Neutro Abs: 7.3 10*3/uL (ref 1.7–7.7)
PLATELETS: 233 10*3/uL (ref 150–400)
RBC: 4.43 MIL/uL (ref 4.22–5.81)
RDW: 13.8 % (ref 11.5–15.5)
WBC: 9.6 10*3/uL (ref 4.0–10.5)

## 2017-08-23 LAB — I-STAT CG4 LACTIC ACID, ED: LACTIC ACID, VENOUS: 1.32 mmol/L (ref 0.5–1.9)

## 2017-08-23 LAB — URINALYSIS, ROUTINE W REFLEX MICROSCOPIC
Bilirubin Urine: NEGATIVE
Glucose, UA: NEGATIVE mg/dL
KETONES UR: NEGATIVE mg/dL
NITRITE: NEGATIVE
PROTEIN: NEGATIVE mg/dL
Specific Gravity, Urine: <1.005 — ABNORMAL LOW (ref 1.005–1.030)
pH: 7.5 (ref 5.0–8.0)

## 2017-08-23 LAB — LIPASE, BLOOD: Lipase: 33 U/L (ref 11–51)

## 2017-08-23 LAB — URINALYSIS, MICROSCOPIC (REFLEX)

## 2017-08-23 LAB — TROPONIN I

## 2017-08-23 MED ORDER — MORPHINE SULFATE (PF) 4 MG/ML IV SOLN
4.0000 mg | Freq: Once | INTRAVENOUS | Status: AC
  Administered 2017-08-23: 4 mg via INTRAVENOUS
  Filled 2017-08-23: qty 1

## 2017-08-23 MED ORDER — ONDANSETRON HCL 4 MG/2ML IJ SOLN
4.0000 mg | Freq: Once | INTRAMUSCULAR | Status: AC
  Administered 2017-08-23: 4 mg via INTRAVENOUS
  Filled 2017-08-23: qty 2

## 2017-08-23 MED ORDER — IOPAMIDOL (ISOVUE-300) INJECTION 61%
100.0000 mL | Freq: Once | INTRAVENOUS | Status: AC | PRN
  Administered 2017-08-23: 80 mL via INTRAVENOUS

## 2017-08-23 NOTE — ED Triage Notes (Signed)
Pt presents with urinary meatus irritation -- states d/t catheter pulling; states catheter in place and draining. Also reports slipping on ice on Tuesday and reports R shoulder and R hip/back pain. Pt in wheelchair in triage -- wife states pt can ambulate without difficulty.

## 2017-08-23 NOTE — ED Notes (Signed)
Foley cath changed (Coude) as ordered with clear urin output.  Wife requested to used their leg bag provided by their Home Health Provider.  Foley cath inserted without difficulty.  Assisted by Margreta Journeyarissa Woods, EMT.

## 2017-08-23 NOTE — ED Provider Notes (Signed)
[facility] EMERGENCY DEPARTMENT Provider Note   CSN: [id number] Arrival date & time: 08/23/17  1301     History   Chief Complaint Chief Complaint  Patient presents with  . Fall  . irritation from foley    HPI David Duke is a 82 y.o. male.  Patient is an 82 year old gentleman with a history of stroke, COPD, dementia, BPH with chronic Foley, intracranial hemorrhage, acute who is presenting today with multiple complaints.  Approximately 6 days ago patient fell because he slipped on the ice.  He fell on his right side and since that time has had soreness in his right posterior shoulder and right sided ribs.  It is worse with certain movements, deep breathing and twisting.  Patient states that yesterday he started to develop lower abdominal pain in the suprapubic area and the right side which is new.  He has had no further falls the pain is worse with movement and palpation.  However he has not had any change in his appetite his Foley catheter is draining urine it is not been bloody.  He denies any diarrhea or fever but states that he feels more shaky than normal.  No new cough or shortness of breath.   The history is provided by the patient and the spouse.  Fall  This is a new problem. Episode onset: 6 days ago when he slipped in the ice and fell on the right side. The problem occurs constantly. The problem has been gradually improving. Associated symptoms include chest pain and abdominal pain. Pertinent negatives include no shortness of breath. The symptoms are aggravated by walking, bending and twisting. Nothing relieves the symptoms. He has tried nothing for the symptoms.    Past Medical History:  Diagnosis Date  . AKI (acute kidney injury) (HCC)   . Anxiety   . Blind right eye   . BPH (benign prostatic hypertrophy) with urinary retention   . COPD (chronic obstructive pulmonary disease) (HCC)   . Dementia   . Depression   . High cholesterol   . Macular  degeneration of both eyes   . Renal disorder   . Shortness of breath dyspnea   . Stroke [facility](HCC)    has residual slurred speech    Patient Active Problem List   Diagnosis Date Noted  . Elevated troponin 03/27/2017  . Diplopia 03/27/2017  . Bilateral leg edema 03/27/2017  . Enterocolitis 09/30/2016  . HLD (hyperlipidemia) 09/30/2016  . Tobacco abuse 09/30/2016  . Nausea vomiting and diarrhea 09/30/2016  . History of stroke in prior 3 months 01/22/2016  . Chronic tension-type headache, not intractable   . Acute frontal sinusitis   . 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 Gait disturbance, post-stroke   . Ataxia, late effect of cerebrovascular disease   . BPH (benign prostatic hyperplasia)   . Urinary retention   . Adjustment disorder with mixed anxiety and depressed mood   . Chronic obstructive pulmonary disease (HCC)   . Cephalalgia   . AKI (acute kidney injury) (HCC)   . Macular degeneration   . Prediabetes   . Hypokalemia   . Absolute anemia   . Aphasia   . Acute encephalopathy 11/25/2015  . Hyponatremia 11/25/2015  . Acute kidney injury (HCC) 11/25/2015  . Depression   . Anxiety   . COPD (chronic obstructive pulmonary disease) (HCC)   . ICH (intracerebral hemorrhage) (HCC) 11/22/2015    Past Surgical History:  Procedure Laterality Date  . NO  PAST SURGERIES         Home Medications    Prior to Admission medications   Medication Sig Start Date End Date Taking? Authorizing Provider  albuterol (ACCUNEB) 0.63 MG/3ML nebulizer solution Take 1 ampule by nebulization every 6 (six) hours as needed for wheezing.   Yes [provider]  busPIRone (BUSPAR) 5 MG tablet Take 5 mg by mouth 2 (two) times daily.   Yes [provider]  albuterol (PROVENTIL HFA;VENTOLIN HFA) 108 (90 Base) MCG/ACT inhaler Inhale 1-2 puffs into the lungs every 6 (six) hours as needed for wheezing or shortness of breath. 12/28/16   Long, Arlyss Repress, MD    atorvastatin (LIPITOR) 20 MG tablet Take 1 tablet (20 mg total) by mouth daily at 6 PM. Patient not taking: Reported on 03/27/2017 09/03/16   Saguier, Ramon Dredge, PA-C  azithromycin (ZITHROMAX Z-PAK) 250 MG tablet 2 po day one, then 1 daily x 4 days 06/21/17   Arthor Captain, PA-C  diazepam (VALIUM) 5 MG tablet Take 1 tablet (5 mg total) by mouth every 8 (eight) hours as needed for anxiety. Can fill no sooner than October 24, 2016. Patient taking differently: Take 5 mg by mouth 4 (four) times daily.  10/20/16   Saguier, Ramon Dredge, PA-C  finasteride (PROSCAR) 5 MG tablet Take 5 mg by mouth daily. 09/22/16   [provider]  fluticasone (FLONASE) 50 MCG/ACT nasal spray Place 1 spray into both nostrils daily. Patient taking differently: Place 1 spray into both nostrils daily as needed for allergies or rhinitis.  12/11/15   Love, [name], PA-C  guaiFENesin (MUCINEX) 600 MG 12 hr tablet Take 2 tablets (1,200 mg total) by mouth 2 (two) times daily. 06/21/17   Harris, Abigail, PA-C  LORazepam (ATIVAN) 1 MG tablet Take 1 tablet (1 mg total) by mouth 3 (three) times daily as needed for anxiety. 04/13/17   Dione Booze, MD  Multiple Vitamin (MULTIVITAMIN WITH MINERALS) TABS tablet Take 1 tablet by mouth daily.    [provider]  predniSONE (DELTASONE) 20 MG tablet 2 tabs po daily x 4 days 08/11/17   Melene Plan, DO    Family History Family History  Problem Relation Age of Onset  . Colon cancer Mother   . Cerebral aneurysm Father     Social History Social History   Tobacco Use  . Smoking status: Current Every Day Smoker    Packs/day: 0.25    Years: 60.00    Pack years: 15.00    Types: Cigarettes  . Smokeless tobacco: Never Used  Substance Use Topics  . Alcohol use: No  . Drug use: No     Allergies   Penicillins; Amoxicillin; and Sulfa antibiotics   Review of Systems Review of Systems  Respiratory: Negative for shortness of breath.   Cardiovascular: Positive for chest pain.   Gastrointestinal: Positive for abdominal pain.  All other systems reviewed and are negative.    Physical Exam Updated Vital Signs BP 126/80 (BP Location: Right Arm)   Pulse 94   Temp 98.3 F (36.8 C) (Oral)   Resp 18   Ht 5\' 8"  (1.727 m)   Wt 63.5 kg (140 lb)   SpO2 97%   BMI 21.29 kg/m   Physical Exam  Constitutional: He is oriented to person, place, and time. He appears well-developed and well-nourished. No distress.  HENT:  Head: Normocephalic and atraumatic.  Mouth/Throat: Oropharynx is clear and moist.  Eyes: Conjunctivae and EOM are normal. Pupils are equal, round, and reactive  to light.  Neck: Normal range of motion. Neck supple.  Cardiovascular: Normal rate, regular rhythm and intact distal pulses.  No murmur heard. Pulmonary/Chest: Effort normal and breath sounds normal. No tachypnea. No respiratory distress. He has no wheezes. He has no rales.     He exhibits tenderness and bony tenderness. He exhibits no crepitus.  Abdominal: Soft. He exhibits no distension. There is tenderness in the right lower quadrant and suprapubic area. There is guarding. There is no rebound and no CVA tenderness.  Musculoskeletal: Normal range of motion. He exhibits no edema or tenderness.  Neurological: He is alert and oriented to person, place, and time.  Skin: Skin is warm and dry. No rash noted. No erythema.  Psychiatric: He has a normal mood and affect. His behavior is normal.  Nursing note and vitals reviewed.    ED Treatments / Results  Labs (all labs ordered are listed, but only abnormal results are displayed) Labs Reviewed  CBC WITH DIFFERENTIAL/PLATELET  COMPREHENSIVE METABOLIC PANEL  LIPASE, BLOOD  TROPONIN I  URINALYSIS, ROUTINE W REFLEX MICROSCOPIC  I-STAT CG4 LACTIC ACID, ED    EKG  EKG Interpretation  Date/Time:  Sunday August 23 2017 14:18:32 EST Ventricular Rate:  77 PR Interval:    QRS Duration: 109 QT Interval:  374 QTC Calculation: 424 R  Axis:   -84 Text Interpretation:  Sinus rhythm Consider right ventricular hypertrophy Inferior infarct, old Lateral leads are also involved No significant change since last tracing Confirmed by Gwyneth SproutPlunkett, Genesia Caslin ([id number]) on 08/23/2017 2:39:03 PM       Radiology No results found.  Procedures Procedures (including critical care time)  Medications Ordered in ED Medications  morphine 4 MG/ML injection 4 mg (4 mg Intravenous Given 08/23/17 1411)  ondansetron (ZOFRAN) injection 4 mg (4 mg Intravenous Given 08/23/17 1411)     Initial Impression / Assessment and Plan / ED Course  I have reviewed the triage vital signs and the nursing notes.  Pertinent labs & imaging results that were available during my care of the patient were reviewed by me and considered in my medical decision making (see chart for details).     Patient presenting with several complaints.  Initial complaint was from pain after a fall on the ice.  He does have some right trapezius and right rib cage tenderness but equal breath sounds.  Low suspicion for pneumothorax or pneumonia the patient could have the potential rib fracture.  May just be muscle strain and contusion.  Patient will get rib films and a chest x-ray.  Secondly patient states as of yesterday he developed abdominal pain.  Wife also states he was confused last night and potentially hallucinating.  Patient has been felt more shaky in the last day than normal.  However there is been no fever, productive cough, Foley catheter is draining.  Patient's last Foley was changed several weeks ago.  He has no hematuria.  However he does have moderate tenderness in the suprapubic and right lower quadrant.  There is no evidence of hernia or testicular irregularity.  CBC, CMP, UA, lactate, troponin, CT of the abdomen and pelvis pending.  Final Clinical Impressions(s) / ED Diagnoses   Final diagnoses:  None    ED Discharge Orders    None       Gwyneth SproutPlunkett, Anea Fodera,  MD 08/24/17 [phone]

## 2017-08-23 NOTE — ED Notes (Signed)
Ct will wait for bun/creat to result given pt's most recent creat was elevated and pt has hx renal insufficiency, also pt age 82>60 yrs

## 2017-08-23 NOTE — Discharge Instructions (Signed)
Please follow-up with your primary care physician for any concerns.  Were happy to report that your labs and imaging has all been s reassuring today.  Please follow-up with your primary care physician for repeat imaging of your aorta as planned., .

## 2017-08-23 NOTE — ED Notes (Signed)
Pt given coffee and graham crackers per MD approval.

## 2017-08-26 ENCOUNTER — Emergency Department (HOSPITAL_BASED_OUTPATIENT_CLINIC_OR_DEPARTMENT_OTHER)
Admission: EM | Admit: 2017-08-26 | Discharge: 2017-08-26 | Disposition: A | Discharge status: Home / Self Care | Payer: Medicare Other | Attending: Emergency Medicine | Admitting: Emergency Medicine

## 2017-08-26 ENCOUNTER — Encounter (HOSPITAL_BASED_OUTPATIENT_CLINIC_OR_DEPARTMENT_OTHER): Payer: Self-pay

## 2017-08-26 ENCOUNTER — Other Ambulatory Visit: Payer: Self-pay

## 2017-08-26 DIAGNOSIS — Z79899 Other long term (current) drug therapy: Secondary | ICD-10-CM | POA: Diagnosis not present

## 2017-08-26 DIAGNOSIS — F0390 Unspecified dementia without behavioral disturbance: Secondary | ICD-10-CM | POA: Insufficient documentation

## 2017-08-26 DIAGNOSIS — Z436 Encounter for attention to other artificial openings of urinary tract: Secondary | ICD-10-CM | POA: Diagnosis not present

## 2017-08-26 DIAGNOSIS — T83511A Infection and inflammatory reaction due to indwelling urethral catheter, initial encounter: Secondary | ICD-10-CM

## 2017-08-26 DIAGNOSIS — F17210 Nicotine dependence, cigarettes, uncomplicated: Secondary | ICD-10-CM | POA: Diagnosis not present

## 2017-08-26 DIAGNOSIS — N390 Urinary tract infection, site not specified: Secondary | ICD-10-CM

## 2017-08-26 DIAGNOSIS — J449 Chronic obstructive pulmonary disease, unspecified: Secondary | ICD-10-CM | POA: Insufficient documentation

## 2017-08-26 DIAGNOSIS — R1030 Lower abdominal pain, unspecified: Secondary | ICD-10-CM | POA: Diagnosis present

## 2017-08-26 LAB — BASIC METABOLIC PANEL
Anion gap: 6 (ref 5–15)
BUN: 10 mg/dL (ref 6–20)
CHLORIDE: 107 mmol/L (ref 101–111)
CO2: 25 mmol/L (ref 22–32)
Calcium: 8.8 mg/dL — ABNORMAL LOW (ref 8.9–10.3)
Creatinine, Ser: 1.49 mg/dL — ABNORMAL HIGH (ref 0.61–1.24)
GFR calc Af Amer: 49 mL/min — ABNORMAL LOW (ref >60)
GFR calc non Af Amer: 43 mL/min — ABNORMAL LOW (ref >60)
GLUCOSE: 90 mg/dL (ref 65–99)
POTASSIUM: 3.7 mmol/L (ref 3.5–5.1)
Sodium: 138 mmol/L (ref 135–145)

## 2017-08-26 LAB — URINALYSIS, ROUTINE W REFLEX MICROSCOPIC
Bilirubin Urine: NEGATIVE
Glucose, UA: NEGATIVE mg/dL
Ketones, ur: NEGATIVE mg/dL
NITRITE: POSITIVE — AB
PH: 7 (ref 5.0–8.0)
Protein, ur: NEGATIVE mg/dL
SPECIFIC GRAVITY, URINE: 1.01 (ref 1.005–1.030)

## 2017-08-26 LAB — URINALYSIS, MICROSCOPIC (REFLEX)

## 2017-08-26 LAB — CBC
HEMATOCRIT: 39.7 % (ref 39.0–52.0)
HEMOGLOBIN: 13.4 g/dL (ref 13.0–17.0)
MCH: 30.7 pg (ref 26.0–34.0)
MCHC: 33.8 g/dL (ref 30.0–36.0)
MCV: 91.1 fL (ref 78.0–100.0)
Platelets: 235 10*3/uL (ref 150–400)
RBC: 4.36 MIL/uL (ref 4.22–5.81)
RDW: 13.7 % (ref 11.5–15.5)
WBC: 9.6 10*3/uL (ref 4.0–10.5)

## 2017-08-26 MED ORDER — LEVOFLOXACIN 750 MG PO TABS
750.0000 mg | ORAL_TABLET | Freq: Every day | ORAL | Status: DC
  Administered 2017-08-26: 750 mg via ORAL
  Filled 2017-08-26: qty 1

## 2017-08-26 MED ORDER — LEVOFLOXACIN 750 MG PO TABS: 750.0000 mg | ORAL_TABLET | ORAL | 0 refills | Status: DC

## 2017-08-26 NOTE — ED Notes (Signed)
ED Provider at bedside. 

## 2017-08-26 NOTE — ED Notes (Signed)
Coude replaced by Lucendia HerrlichAndrea Adkins, RN.  Pt still c/o pain to his bladder area.  Foley cath draining well, clear yellow urine.

## 2017-08-26 NOTE — ED Provider Notes (Signed)
[facility] EMERGENCY DEPARTMENT Provider Note   CSN: [id number] Arrival date & time: 08/26/17  1549     History   Chief Complaint Chief Complaint  Patient presents with  . Abdominal Pain    HPI David Duke is a 82 y.o. male.  Patient with history of stroke, enlarged prostate, indwelling Foley catheter --presents with complaint of feeling like he has a blockage in his catheter.  He has associated lower abdominal pain and irritation.  States that he could not sleep last night because he did not note any urine in his bag.  Patient denies fever, nausea, vomiting, diarrhea.  Foley was changed at previous visit 3 days ago.  The bag appears to be draining well without blood.  Home health nurse thought that everything looked okay yesterday. The onset of this condition was acute. The course is constant. Aggravating factors: none. Alleviating factors: none.        Past Medical History:  Diagnosis Date  . AKI (acute kidney injury) (HCC)   . Anxiety   . Blind right eye   . BPH (benign prostatic hypertrophy) with urinary retention   . COPD (chronic obstructive pulmonary disease) (HCC)   . Dementia   . Depression   . High cholesterol   . Macular degeneration of both eyes   . Renal disorder   . Shortness of breath dyspnea   . Stroke [facility](HCC)    has residual slurred speech    Patient Active Problem List   Diagnosis Date Noted  . Elevated troponin 03/27/2017  . Diplopia 03/27/2017  . Bilateral leg edema 03/27/2017  . Enterocolitis 09/30/2016  . HLD (hyperlipidemia) 09/30/2016  . Tobacco abuse 09/30/2016  . Nausea vomiting and diarrhea 09/30/2016  . History of stroke in prior 3 months 01/22/2016  . Chronic tension-type headache, not intractable   . Acute frontal sinusitis   . 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 Gait disturbance, post-stroke   . Ataxia, late effect of cerebrovascular disease   . BPH (benign prostatic hyperplasia)   .  Urinary retention   . Adjustment disorder with mixed anxiety and depressed mood   . Chronic obstructive pulmonary disease (HCC)   . Cephalalgia   . AKI (acute kidney injury) (HCC)   . Macular degeneration   . Prediabetes   . Hypokalemia   . Absolute anemia   . Aphasia   . Acute encephalopathy 11/25/2015  . Hyponatremia 11/25/2015  . Acute kidney injury (HCC) 11/25/2015  . Depression   . Anxiety   . COPD (chronic obstructive pulmonary disease) (HCC)   . ICH (intracerebral hemorrhage) (HCC) 11/22/2015    Past Surgical History:  Procedure Laterality Date  . NO PAST SURGERIES         Home Medications    Prior to Admission medications   Medication Sig Start Date End Date Taking? Authorizing Provider  albuterol (ACCUNEB) 0.63 MG/3ML nebulizer solution Take 1 ampule by nebulization every 6 (six) hours as needed for wheezing.    [provider]  albuterol (PROVENTIL HFA;VENTOLIN HFA) 108 (90 Base) MCG/ACT inhaler Inhale 1-2 puffs into the lungs every 6 (six) hours as needed for wheezing or shortness of breath. 12/28/16   Long, Arlyss RepressJoshua G, MD  atorvastatin (LIPITOR) 20 MG tablet Take 1 tablet (20 mg total) by mouth daily at 6 PM. Patient not taking: Reported on 03/27/2017 09/03/16   Saguier, Ramon DredgeEdward, PA-C  azithromycin (ZITHROMAX Z-PAK) 250 MG tablet 2 po day one, then 1  daily x 4 days 06/21/17   Arthor CaptainHarris, Abigail, PA-C  busPIRone (BUSPAR) 5 MG tablet Take 5 mg by mouth 2 (two) times daily.    [provider]  diazepam (VALIUM) 5 MG tablet Take 1 tablet (5 mg total) by mouth every 8 (eight) hours as needed for anxiety. Can fill no sooner than October 24, 2016. Patient taking differently: Take 5 mg by mouth 4 (four) times daily.  10/20/16   Saguier, Ramon DredgeEdward, PA-C  finasteride (PROSCAR) 5 MG tablet Take 5 mg by mouth daily. 09/22/16   [provider]  fluticasone (FLONASE) 50 MCG/ACT nasal spray Place 1 spray into both nostrils daily. Patient taking differently: Place  1 spray into both nostrils daily as needed for allergies or rhinitis.  12/11/15   Love, Evlyn KannerPamela S, PA-C  guaiFENesin (MUCINEX) 600 MG 12 hr tablet Take 2 tablets (1,200 mg total) by mouth 2 (two) times daily. 06/21/17   Harris, Abigail, PA-C  LORazepam (ATIVAN) 1 MG tablet Take 1 tablet (1 mg total) by mouth 3 (three) times daily as needed for anxiety. 04/13/17   Dione BoozeGlick, Axle, MD  Multiple Vitamin (MULTIVITAMIN WITH MINERALS) TABS tablet Take 1 tablet by mouth daily.    [provider]  predniSONE (DELTASONE) 20 MG tablet 2 tabs po daily x 4 days 08/11/17   Melene PlanFloyd, Dan, DO    Family History Family History  Problem Relation Age of Onset  . Colon cancer Mother   . Cerebral aneurysm Father     Social History Social History   Tobacco Use  . Smoking status: Current Every Day Smoker    Packs/day: 0.25    Years: 60.00    Pack years: 15.00    Types: Cigarettes  . Smokeless tobacco: Never Used  Substance Use Topics  . Alcohol use: No  . Drug use: No     Allergies   Penicillins; Amoxicillin; and Sulfa antibiotics   Review of Systems Review of Systems  Constitutional: Negative for fever.  HENT: Negative for rhinorrhea and sore throat.   Eyes: Negative for redness.  Respiratory: Negative for cough.   Cardiovascular: Negative for chest pain.  Gastrointestinal: Positive for abdominal pain. Negative for diarrhea, nausea and vomiting.  Genitourinary: Positive for difficulty urinating. Negative for dysuria, flank pain, frequency, hematuria, penile pain, penile swelling, scrotal swelling and testicular pain.  Musculoskeletal: Negative for myalgias.  Skin: Negative for rash.  Neurological: Negative for headaches.     Physical Exam Updated Vital Signs BP (!) 165/98 (BP Location: Right Arm)   Pulse 88   Temp 98 F (36.7 C) (Oral)   Resp 20   Ht 5\' 8"  (1.727 m)   Wt 63.5 kg (140 lb)   SpO2 98%   BMI 21.29 kg/m   Physical Exam  Constitutional: He appears well-developed and  well-nourished.  HENT:  Head: Normocephalic and atraumatic.  Eyes: Conjunctivae are normal.  Neck: Normal range of motion. Neck supple.  Pulmonary/Chest: No respiratory distress.  Abdominal: There is tenderness (Mild) in the suprapubic area. There is no rebound and no guarding.  Genitourinary: Testes normal. Right testis shows no mass, no swelling and no tenderness. Left testis shows no mass, no swelling and no tenderness.  Genitourinary Comments: Normal-appearing meatus with Foley in place.  Leg bag attached with yellow clear urine.  Neurological: He is alert.  Skin: Skin is warm and dry.  Psychiatric: He has a normal mood and affect.  Nursing note and vitals reviewed.    ED Treatments / Results  Labs (all labs ordered are listed, but only abnormal results are displayed) Labs Reviewed  URINALYSIS, ROUTINE W REFLEX MICROSCOPIC - Abnormal; Notable for the following components:      Result Value   APPearance CLOUDY (*)    Hgb urine dipstick SMALL (*)    Nitrite POSITIVE (*)    Leukocytes, UA LARGE (*)    All other components within normal limits  BASIC METABOLIC PANEL - Abnormal; Notable for the following components:   Creatinine, Ser 1.49 (*)    Calcium 8.8 (*)    GFR calc non Af Amer 43 (*)    GFR calc Af Amer 49 (*)    All other components within normal limits  URINALYSIS, MICROSCOPIC (REFLEX) - Abnormal; Notable for the following components:   Bacteria, UA MANY (*)    Squamous Epithelial / LPF 0-5 (*)    All other components within normal limits  URINE CULTURE  CBC    Procedures Procedures (including critical care time)  Medications Ordered in ED Medications  levofloxacin (LEVAQUIN) tablet 750 mg (750 mg Oral Given 08/26/17 1950)     Initial Impression / Assessment and Plan / ED Course  I have reviewed the triage vital signs and the nursing notes.  Pertinent labs & imaging results that were available during my care of the patient were reviewed by me and  considered in my medical decision making (see chart for details).     Patient seen and examined. Work-up initiated. Will discuss with Dr. Clarene Duke.   Vital signs reviewed and are as follows: BP (!) 165/98 (BP Location: Right Arm)   Pulse 88   Temp 98 F (36.7 C) (Oral)   Resp 20   Ht 5\' 8"  (1.727 m)   Wt 63.5 kg (140 lb)   SpO2 98%   BMI 21.29 kg/m   Patient has been seen by Dr. Clarene Duke.  Given apparent worsening UTI, will replace catheter again, start on Levaquin.  Every other day dosing based on creatinine clearance of 36 mL/min.   Updated patient and family member.  Encouraged follow-up this week for recheck.  Final Clinical Impressions(s) / ED Diagnoses   Final diagnoses:  Urinary tract infection associated with indwelling urethral catheter, initial encounter [facility])   Patient with feeling of catheter obstruction, no apparent catheter obstruction.  UA is suggestive of urinary tract infection, worse from last check 3 days ago.  No systemic signs of illness.  Patient appears well, nontoxic.  ED Discharge Orders        Ordered    levofloxacin (LEVAQUIN) 750 MG tablet  Every other day     08/26/17 1958       Renne Crigler, Cordelia Poche 08/26/17 2002    Little, Ambrose Finland, MD 08/28/17 [phone]

## 2017-08-26 NOTE — Discharge Instructions (Signed)
Please read and follow all provided instructions.  Your diagnoses today include:  1. Urinary tract infection associated with indwelling urethral catheter, initial encounter (HCC)     Tests performed today include:  Urine test - suggests that you have an infection in your bladder  Blood counts and electrolytes  Vital signs. See below for your results today.   Medications prescribed:   Levofloxacin - antibiotic for urinary tract infection  Home care instructions:  Follow any educational materials contained in this packet.  Follow-up instructions: Please follow-up with your primary care provider in 3 days for recheck of your urine.  Return instructions:   Please return to the Emergency Department if you experience worsening symptoms.   Return with fever, worsening pain, persistent vomiting, worsening pain in your back.   Please return if you have any other emergent concerns.  Additional Information:  Your vital signs today were: BP (!) 150/97 (BP Location: Right Arm)    Pulse 79    Temp 97.7 F (36.5 C) (Oral)    Resp 18    Ht 5\' 8"  (1.727 m)    Wt 63.5 kg (140 lb)    SpO2 99%    BMI 21.29 kg/m  If your blood pressure (BP) was elevated above 135/85 this visit, please have this repeated by your doctor within one month. --------------

## 2017-08-26 NOTE — ED Triage Notes (Signed)
C/o pain to lower abd/"bladder"-states he was here 12/16 and had foley changed-states foley is draining-pt presents to triage in w/c-NAD-wife with pt

## 2017-08-26 NOTE — ED Notes (Signed)
Foley bag switched out to a leg bag and patient and wife understand how to change and clean for proper care.

## 2017-08-29 LAB — URINE CULTURE

## 2017-08-30 ENCOUNTER — Telehealth: Payer: Self-pay

## 2017-08-30 NOTE — Telephone Encounter (Signed)
Post ED Visit - Positive Culture Follow-up  Culture report reviewed by antimicrobial stewardship pharmacist:  []  Enzo BiNathan Batchelder, Pharm.D. []  Celedonio MiyamotoJeremy Frens, Pharm.D., BCPS AQ-ID []  Garvin FilaMike Maccia, Pharm.D., BCPS []  Georgina PillionElizabeth Martin, Pharm.D., BCPS []  TuckahoeMinh Pham, VermontPharm.D., BCPS, AAHIVP []  Estella HuskMichelle Turner, Pharm.D., BCPS, AAHIVP [x]  Lysle Pearlachel Rumbarger, PharmD, BCPS []  Casilda Carlsaylor Stone, PharmD, BCPS []  Pollyann SamplesAndy Johnston, PharmD, BCPS  Positive urine culture Treated with Levofloxacin, organism sensitive to the same and no further patient follow-up is required at this time.  Jerry CarasCullom, Janetta Vandoren Burnett 08/30/2017, 10:32 AM

## 2017-09-07 ENCOUNTER — Emergency Department (HOSPITAL_COMMUNITY): Payer: Medicare Other

## 2017-09-07 ENCOUNTER — Other Ambulatory Visit: Payer: Self-pay

## 2017-09-07 ENCOUNTER — Inpatient Hospital Stay (HOSPITAL_COMMUNITY)
Admission: EM | Admit: 2017-09-07 | Discharge: 2017-09-11 | DRG: 190 | Disposition: A | Discharge status: Home Health | Payer: Medicare Other | Attending: Nephrology | Admitting: Nephrology

## 2017-09-07 ENCOUNTER — Encounter (HOSPITAL_COMMUNITY): Payer: Self-pay | Admitting: Emergency Medicine

## 2017-09-07 DIAGNOSIS — F17210 Nicotine dependence, cigarettes, uncomplicated: Secondary | ICD-10-CM | POA: Diagnosis present

## 2017-09-07 DIAGNOSIS — I69320 Aphasia following cerebral infarction: Secondary | ICD-10-CM

## 2017-09-07 DIAGNOSIS — R338 Other retention of urine: Secondary | ICD-10-CM | POA: Diagnosis not present

## 2017-09-07 DIAGNOSIS — E440 Moderate protein-calorie malnutrition: Secondary | ICD-10-CM | POA: Diagnosis present

## 2017-09-07 DIAGNOSIS — N183 Chronic kidney disease, stage 3 (moderate): Secondary | ICD-10-CM | POA: Diagnosis present

## 2017-09-07 DIAGNOSIS — J9601 Acute respiratory failure with hypoxia: Secondary | ICD-10-CM | POA: Diagnosis present

## 2017-09-07 DIAGNOSIS — H3530 Unspecified macular degeneration: Secondary | ICD-10-CM | POA: Diagnosis present

## 2017-09-07 DIAGNOSIS — R4701 Aphasia: Secondary | ICD-10-CM | POA: Diagnosis not present

## 2017-09-07 DIAGNOSIS — F0151 Vascular dementia with behavioral disturbance: Secondary | ICD-10-CM | POA: Diagnosis not present

## 2017-09-07 DIAGNOSIS — K219 Gastro-esophageal reflux disease without esophagitis: Secondary | ICD-10-CM | POA: Diagnosis present

## 2017-09-07 DIAGNOSIS — R339 Retention of urine, unspecified: Secondary | ICD-10-CM | POA: Diagnosis not present

## 2017-09-07 DIAGNOSIS — N179 Acute kidney failure, unspecified: Secondary | ICD-10-CM | POA: Diagnosis present

## 2017-09-07 DIAGNOSIS — E7800 Pure hypercholesterolemia, unspecified: Secondary | ICD-10-CM | POA: Diagnosis present

## 2017-09-07 DIAGNOSIS — J441 Chronic obstructive pulmonary disease with (acute) exacerbation: Secondary | ICD-10-CM | POA: Diagnosis not present

## 2017-09-07 DIAGNOSIS — F418 Other specified anxiety disorders: Secondary | ICD-10-CM | POA: Diagnosis present

## 2017-09-07 DIAGNOSIS — F0150 Vascular dementia without behavioral disturbance: Secondary | ICD-10-CM | POA: Diagnosis present

## 2017-09-07 DIAGNOSIS — N189 Chronic kidney disease, unspecified: Secondary | ICD-10-CM | POA: Diagnosis not present

## 2017-09-07 DIAGNOSIS — N401 Enlarged prostate with lower urinary tract symptoms: Secondary | ICD-10-CM | POA: Diagnosis present

## 2017-09-07 DIAGNOSIS — I69319 Unspecified symptoms and signs involving cognitive functions following cerebral infarction: Secondary | ICD-10-CM

## 2017-09-07 DIAGNOSIS — I612 Nontraumatic intracerebral hemorrhage in hemisphere, unspecified: Secondary | ICD-10-CM | POA: Diagnosis not present

## 2017-09-07 DIAGNOSIS — Z720 Tobacco use: Secondary | ICD-10-CM | POA: Diagnosis present

## 2017-09-07 DIAGNOSIS — F4323 Adjustment disorder with mixed anxiety and depressed mood: Secondary | ICD-10-CM | POA: Diagnosis not present

## 2017-09-07 DIAGNOSIS — J310 Chronic rhinitis: Secondary | ICD-10-CM | POA: Diagnosis not present

## 2017-09-07 DIAGNOSIS — I69919 Unspecified symptoms and signs involving cognitive functions following unspecified cerebrovascular disease: Secondary | ICD-10-CM | POA: Diagnosis not present

## 2017-09-07 DIAGNOSIS — I619 Nontraumatic intracerebral hemorrhage, unspecified: Secondary | ICD-10-CM | POA: Diagnosis present

## 2017-09-07 LAB — BASIC METABOLIC PANEL
Anion gap: 8 (ref 5–15)
BUN: 11 mg/dL (ref 6–20)
CHLORIDE: 105 mmol/L (ref 101–111)
CO2: 26 mmol/L (ref 22–32)
CREATININE: 1.45 mg/dL — AB (ref 0.61–1.24)
Calcium: 9 mg/dL (ref 8.9–10.3)
GFR calc Af Amer: 51 mL/min — ABNORMAL LOW (ref >60)
GFR calc non Af Amer: 44 mL/min — ABNORMAL LOW (ref >60)
GLUCOSE: 115 mg/dL — AB (ref 65–99)
POTASSIUM: 4 mmol/L (ref 3.5–5.1)
Sodium: 139 mmol/L (ref 135–145)

## 2017-09-07 LAB — CBC
HEMATOCRIT: 42.2 % (ref 39.0–52.0)
Hemoglobin: 14.3 g/dL (ref 13.0–17.0)
MCH: 31 pg (ref 26.0–34.0)
MCHC: 33.9 g/dL (ref 30.0–36.0)
MCV: 91.3 fL (ref 78.0–100.0)
PLATELETS: 256 10*3/uL (ref 150–400)
RBC: 4.62 MIL/uL (ref 4.22–5.81)
RDW: 13.7 % (ref 11.5–15.5)
WBC: 10 10*3/uL (ref 4.0–10.5)

## 2017-09-07 LAB — MAGNESIUM: Magnesium: 1.9 mg/dL (ref 1.7–2.4)

## 2017-09-07 LAB — BRAIN NATRIURETIC PEPTIDE: B Natriuretic Peptide: 24.3 pg/mL (ref 0.0–100.0)

## 2017-09-07 LAB — I-STAT TROPONIN, ED: Troponin i, poc: 0.01 ng/mL (ref 0.00–0.08)

## 2017-09-07 MED ORDER — ALBUTEROL SULFATE 0.63 MG/3ML IN NEBU: 1.0000 | INHALATION_SOLUTION | RESPIRATORY_TRACT | Status: DC | PRN

## 2017-09-07 MED ORDER — PANTOPRAZOLE SODIUM 40 MG PO TBEC
40.0000 mg | DELAYED_RELEASE_TABLET | Freq: Every day | ORAL | Status: DC
  Administered 2017-09-07 – 2017-09-11 (×5): 40 mg via ORAL
  Filled 2017-09-07 (×5): qty 1

## 2017-09-07 MED ORDER — BUDESONIDE 0.5 MG/2ML IN SUSP
0.5000 mg | Freq: Two times a day (BID) | RESPIRATORY_TRACT | Status: DC
  Administered 2017-09-07 – 2017-09-11 (×9): 0.5 mg via RESPIRATORY_TRACT
  Filled 2017-09-07 (×9): qty 2

## 2017-09-07 MED ORDER — SODIUM CHLORIDE 0.9 % IV SOLN
INTRAVENOUS | Status: AC
  Administered 2017-09-07: 09:00:00 via INTRAVENOUS

## 2017-09-07 MED ORDER — HEPARIN SODIUM (PORCINE) 5000 UNIT/ML IJ SOLN
5000.0000 [IU] | Freq: Three times a day (TID) | INTRAMUSCULAR | Status: DC
  Administered 2017-09-07 – 2017-09-11 (×11): 5000 [IU] via SUBCUTANEOUS
  Filled 2017-09-07 (×12): qty 1

## 2017-09-07 MED ORDER — LORATADINE 10 MG PO TABS
10.0000 mg | ORAL_TABLET | Freq: Every day | ORAL | Status: DC
  Administered 2017-09-07 – 2017-09-11 (×5): 10 mg via ORAL
  Filled 2017-09-07 (×5): qty 1

## 2017-09-07 MED ORDER — IPRATROPIUM-ALBUTEROL 0.5-2.5 (3) MG/3ML IN SOLN: 3.0000 mL | Freq: Once | RESPIRATORY_TRACT | Status: DC

## 2017-09-07 MED ORDER — ADULT MULTIVITAMIN W/MINERALS CH
1.0000 | ORAL_TABLET | Freq: Every day | ORAL | Status: DC
  Administered 2017-09-07 – 2017-09-11 (×5): 1 via ORAL
  Filled 2017-09-07 (×5): qty 1

## 2017-09-07 MED ORDER — ACETAMINOPHEN 325 MG PO TABS
650.0000 mg | ORAL_TABLET | Freq: Four times a day (QID) | ORAL | Status: DC | PRN
  Filled 2017-09-07: qty 2

## 2017-09-07 MED ORDER — ONDANSETRON HCL 4 MG/2ML IJ SOLN: 4.0000 mg | Freq: Four times a day (QID) | INTRAMUSCULAR | Status: DC | PRN

## 2017-09-07 MED ORDER — LORAZEPAM 2 MG/ML IJ SOLN
1.0000 mg | Freq: Once | INTRAMUSCULAR | Status: AC
  Administered 2017-09-07: 1 mg via INTRAVENOUS
  Filled 2017-09-07: qty 1

## 2017-09-07 MED ORDER — FINASTERIDE 5 MG PO TABS
5.0000 mg | ORAL_TABLET | Freq: Every day | ORAL | Status: DC
  Administered 2017-09-07 – 2017-09-11 (×5): 5 mg via ORAL
  Filled 2017-09-07 (×5): qty 1

## 2017-09-07 MED ORDER — BUSPIRONE HCL 5 MG PO TABS
5.0000 mg | ORAL_TABLET | Freq: Three times a day (TID) | ORAL | Status: DC
  Administered 2017-09-07 – 2017-09-11 (×13): 5 mg via ORAL
  Filled 2017-09-07 (×4): qty 1
  Filled 2017-09-07: qty 0.5
  Filled 2017-09-07 (×9): qty 1

## 2017-09-07 MED ORDER — DIAZEPAM 5 MG PO TABS
5.0000 mg | ORAL_TABLET | Freq: Four times a day (QID) | ORAL | Status: DC
  Administered 2017-09-07 (×2): 5 mg via ORAL
  Filled 2017-09-07 (×2): qty 1

## 2017-09-07 MED ORDER — ALBUTEROL SULFATE (2.5 MG/3ML) 0.083% IN NEBU: 2.5000 mg | INHALATION_SOLUTION | Freq: Four times a day (QID) | RESPIRATORY_TRACT | Status: DC

## 2017-09-07 MED ORDER — FLUTICASONE PROPIONATE 50 MCG/ACT NA SUSP
1.0000 | Freq: Every day | NASAL | Status: DC
  Administered 2017-09-07 – 2017-09-11 (×5): 1 via NASAL
  Filled 2017-09-07: qty 16

## 2017-09-07 MED ORDER — IPRATROPIUM BROMIDE 0.02 % IN SOLN: 0.5000 mg | Freq: Four times a day (QID) | RESPIRATORY_TRACT | Status: DC

## 2017-09-07 MED ORDER — IPRATROPIUM-ALBUTEROL 0.5-2.5 (3) MG/3ML IN SOLN
3.0000 mL | Freq: Three times a day (TID) | RESPIRATORY_TRACT | Status: DC
  Administered 2017-09-07 – 2017-09-09 (×5): 3 mL via RESPIRATORY_TRACT
  Filled 2017-09-07 (×5): qty 3

## 2017-09-07 MED ORDER — METHYLPREDNISOLONE SODIUM SUCC 125 MG IJ SOLR
60.0000 mg | Freq: Three times a day (TID) | INTRAMUSCULAR | Status: DC
  Administered 2017-09-07 – 2017-09-09 (×6): 60 mg via INTRAVENOUS
  Filled 2017-09-07 (×6): qty 2

## 2017-09-07 MED ORDER — GUAIFENESIN ER 600 MG PO TB12
600.0000 mg | ORAL_TABLET | Freq: Two times a day (BID) | ORAL | Status: DC
  Administered 2017-09-07 – 2017-09-11 (×9): 600 mg via ORAL
  Filled 2017-09-07 (×9): qty 1

## 2017-09-07 MED ORDER — ACETAMINOPHEN 650 MG RE SUPP
650.0000 mg | Freq: Four times a day (QID) | RECTAL | Status: DC | PRN
  Filled 2017-09-07: qty 1

## 2017-09-07 MED ORDER — DOXYCYCLINE HYCLATE 100 MG PO TABS
100.0000 mg | ORAL_TABLET | Freq: Two times a day (BID) | ORAL | Status: DC
  Administered 2017-09-07 – 2017-09-11 (×9): 100 mg via ORAL
  Filled 2017-09-07 (×9): qty 1

## 2017-09-07 MED ORDER — METHYLPREDNISOLONE SODIUM SUCC 125 MG IJ SOLR: 80.0000 mg | Freq: Once | INTRAMUSCULAR | Status: DC

## 2017-09-07 MED ORDER — DIAZEPAM 5 MG PO TABS
5.0000 mg | ORAL_TABLET | Freq: Three times a day (TID) | ORAL | Status: DC
  Administered 2017-09-07: 5 mg via ORAL
  Filled 2017-09-07: qty 1

## 2017-09-07 MED ORDER — ONDANSETRON HCL 4 MG PO TABS
4.0000 mg | ORAL_TABLET | Freq: Four times a day (QID) | ORAL | Status: DC | PRN
  Administered 2017-09-09: 4 mg via ORAL
  Filled 2017-09-07: qty 1

## 2017-09-07 MED ORDER — IPRATROPIUM-ALBUTEROL 0.5-2.5 (3) MG/3ML IN SOLN
3.0000 mL | Freq: Four times a day (QID) | RESPIRATORY_TRACT | Status: DC
  Administered 2017-09-07: 3 mL via RESPIRATORY_TRACT
  Filled 2017-09-07: qty 3

## 2017-09-07 NOTE — Evaluation (Signed)
Physical Therapy Evaluation Patient Details Name: David BarlowDavid R Kovar MRN: [id number] DOB: 03/30/1935 Today's Date: 09/07/2017   History of Present Illness  David BarlowDavid R Duke is a 82 year old male with a past medical history significant for tobacco abuse , anxiety/depression, chronic BPH with urinary retention (with chronic Foley dependency), chronic kidney disease ,and prior history of hemorrhagic stroke (with residual aphasia and vascular dementia); who presented to the emergency department 12/31 complaining of shortness of breath and increased wheezing.   Clinical Impression  The patient ambulated x 400' with minguard/supervision    Follow Up Recommendations Home health PT    Equipment Recommendations  None recommended by PT    Recommendations for Other Services       Precautions / Restrictions Precautions Precautions: Fall Precaution Comments: monitor sats Restrictions Weight Bearing Restrictions: No      Mobility  Bed Mobility Overal bed mobility: Independent                Transfers Overall transfer level: Needs assistance Equipment used: None Transfers: Sit to/from Stand Sit to Stand: Supervision            Ambulation/Gait Ambulation/Gait assistance: Supervision;Min guard Ambulation Distance (Feet): 400 Feet Assistive device: None     Gait velocity interpretation: Below normal speed for age/gender General Gait Details:  loss of balance noted x one  Stairs            Wheelchair Mobility    Modified Rankin (Stroke Patients Only)       Balance Overall balance assessment: Needs assistance Sitting-balance support: Feet supported;No upper extremity supported Sitting balance-Leahy Scale: Normal     Standing balance support: During functional activity;No upper extremity supported   Standing balance comment: patient standing  and leaned over to tie his shoelaces with no assistance, just guarding                              Pertinent Vitals/Pain Pain Assessment: No/denies pain    Home Living Family/patient expects to be discharged to:: Private residence Living Arrangements: Spouse/significant other Available Help at Discharge: Family;Available 24 hours/day Type of Home: House Home Access: Stairs to enter Entrance Stairs-Rails: Right;Left;Can reach both Entrance Stairs-Number of Steps: 3 Home Layout: One level Home Equipment: Shower seat      Prior Function Level of Independence: Independent               Hand Dominance        Extremity/Trunk Assessment   Upper Extremity Assessment Upper Extremity Assessment: Generalized weakness    Lower Extremity Assessment Lower Extremity Assessment: Generalized weakness    Cervical / Trunk Assessment Cervical / Trunk Assessment: Kyphotic  Communication   Communication: Expressive difficulties  Cognition Arousal/Alertness: Awake/alert Behavior During Therapy: WFL for tasks assessed/performed Overall Cognitive Status: History of cognitive impairments - at baseline                                 General Comments: some expressive difficulties. Spent much time giving his history of work and needing to talk to someone about his fear of being alone and not having the doors closed.      General Comments      Exercises     Assessment/Plan    PT Assessment Patient needs continued PT services  PT Problem List Decreased mobility;Decreased balance       PT Treatment Interventions Gait  training;Functional mobility training    PT Goals (Current goals can be found in the Care Plan section)  Acute Rehab PT Goals Patient Stated Goal: agreed to ambulate PT Goal Formulation: With patient Time For Goal Achievement: 09/21/17 Potential to Achieve Goals: Good    Frequency Min 2X/week   Barriers to discharge        Co-evaluation               AM-PAC PT "6 Clicks" Daily Activity  Outcome Measure Difficulty turning  over in bed (including adjusting bedclothes, sheets and blankets)?: None Difficulty moving from lying on back to sitting on the side of the bed? : None Difficulty sitting down on and standing up from a chair with arms (e.g., wheelchair, bedside commode, etc,.)?: None Help needed moving to and from a bed to chair (including a wheelchair)?: A Little Help needed walking in hospital room?: A Little Help needed climbing 3-5 steps with a railing? : A Little 6 Click Score: 21    End of Session Equipment Utilized During Treatment: Gait belt Activity Tolerance: Patient tolerated treatment well Patient left: in chair;with chair alarm set Nurse Communication: Mobility status PT Visit Diagnosis: Unsteadiness on feet (R26.81)    Time: [id number] PT Time Calculation (min) (ACUTE ONLY): 28 min   Charges:   PT Evaluation $PT Eval Low Complexity: 1 Low PT Treatments $Gait Training: 8-22 mins   PT G CodesBlanchard Kelch:        [facility] PT [phone]   Rada HayHill, Derita Michelsen Elizabeth 09/07/2017, 1:29 PM

## 2017-09-07 NOTE — Progress Notes (Signed)
Pt. arrived from ED via stretcher, no respiratory distress noted. Spouse at bedside.

## 2017-09-07 NOTE — Care Management Note (Signed)
Case Management Note  Patient Details  Name: David BarlowDavid R Duke MRN: [id number] Date of Birth: 02/21/1935  Subjective/Objective:                  Shortness of breath/copd  Action/Plan: Date: September 07, 2017 David SmilingRhonda Abagail Duke, BSN, Strat[place]RN3, [place]CCM [phone] Chart and notes review for patient progress and needs. Will follow for case management and discharge needs. Next review date: [id number]  Expected Discharge Date:                  Expected Discharge Plan:  Home/Self Care  In-House Referral:     Discharge planning Services  CM Consult  Post Acute Care Choice:    Choice offered to:     DME Arranged:    DME Agency:     HH Arranged:    HH Agency:     Status of Service:  In process, will continue to follow  If discussed at Long Length of Stay Meetings, dates discussed:    Additional Comments:  Golda AcreDavis, Keeanna Villafranca Lynn, RN 09/07/2017, 8:41 AM

## 2017-09-07 NOTE — ED Provider Notes (Signed)
[facility]-EMERGENCY DEPT Provider Note   CSN: [id number] Arrival date & time: 09/07/17  0208     History   Chief Complaint Chief Complaint  Patient presents with  . Shortness of Breath  . COPD    HPI David BarlowDavid R Duke is a 82 y.o. male.  Patient is an 82 year old male with past medical history of COPD, anxiety, prior CVA, and dementia. He presents today for evaluation of shortness of breath. This is worsened over the past several days. He reports his breathing is worse when he ambulates even a short distance. He denies any chest pain. He denies any fevers, chills, or cough. He has used his nebulizer at home with little relief.   The history is provided by the patient.  Shortness of Breath  This is a new problem. The average episode lasts 3 days. The problem occurs continuously.The problem has been gradually worsening. Pertinent negatives include no fever, no sputum production and no chest pain. Treatments tried: Nebulizer. The treatment provided mild relief. Associated medical issues include COPD.  COPD  Associated symptoms include shortness of breath. Pertinent negatives include no chest pain.    Past Medical History:  Diagnosis Date  . AKI (acute kidney injury) (HCC)   . Anxiety   . Blind right eye   . BPH (benign prostatic hypertrophy) with urinary retention   . COPD (chronic obstructive pulmonary disease) (HCC)   . Dementia   . Depression   . High cholesterol   . Macular degeneration of both eyes   . Renal disorder   . Shortness of breath dyspnea   . Stroke [facility](HCC)    has residual slurred speech    Patient Active Problem List   Diagnosis Date Noted  . Elevated troponin 03/27/2017  . Diplopia 03/27/2017  . Bilateral leg edema 03/27/2017  . Enterocolitis 09/30/2016  . HLD (hyperlipidemia) 09/30/2016  . Tobacco abuse 09/30/2016  . Nausea vomiting and diarrhea 09/30/2016  . History of stroke in prior 3 months 01/22/2016  . Chronic  tension-type headache, not intractable   . Acute frontal sinusitis   . 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 Gait disturbance, post-stroke   . Ataxia, late effect of cerebrovascular disease   . BPH (benign prostatic hyperplasia)   . Urinary retention   . Adjustment disorder with mixed anxiety and depressed mood   . Chronic obstructive pulmonary disease (HCC)   . Cephalalgia   . AKI (acute kidney injury) (HCC)   . Macular degeneration   . Prediabetes   . Hypokalemia   . Absolute anemia   . Aphasia   . Acute encephalopathy 11/25/2015  . Hyponatremia 11/25/2015  . Acute kidney injury (HCC) 11/25/2015  . Depression   . Anxiety   . COPD (chronic obstructive pulmonary disease) (HCC)   . ICH (intracerebral hemorrhage) (HCC) 11/22/2015    Past Surgical History:  Procedure Laterality Date  . NO PAST SURGERIES         Home Medications    Prior to Admission medications   Medication Sig Start Date End Date Taking? Authorizing Provider  albuterol (ACCUNEB) 0.63 MG/3ML nebulizer solution Take 1 ampule by nebulization every 6 (six) hours as needed for wheezing.    [provider]  albuterol (PROVENTIL HFA;VENTOLIN HFA) 108 (90 Base) MCG/ACT inhaler Inhale 1-2 puffs into the lungs every 6 (six) hours as needed for wheezing or shortness of breath. 12/28/16   Long, Arlyss RepressJoshua G, MD  busPIRone (BUSPAR) 5 MG tablet  Take 5 mg by mouth 2 (two) times daily.    [provider]  diazepam (VALIUM) 5 MG tablet Take 1 tablet (5 mg total) by mouth every 8 (eight) hours as needed for anxiety. Can fill no sooner than October 24, 2016. Patient taking differently: Take 5 mg by mouth 4 (four) times daily.  10/20/16   Saguier, Ramon DredgeEdward, PA-C  finasteride (PROSCAR) 5 MG tablet Take 5 mg by mouth daily. 09/22/16   [provider]  fluticasone (FLONASE) 50 MCG/ACT nasal spray Place 1 spray into both nostrils daily. Patient taking differently: Place 1 spray into  both nostrils daily as needed for allergies or rhinitis.  12/11/15   Love, Evlyn KannerPamela S, PA-C  guaiFENesin (MUCINEX) 600 MG 12 hr tablet Take 2 tablets (1,200 mg total) by mouth 2 (two) times daily. 06/21/17   Harris, Cammy CopaAbigail, PA-C  levofloxacin (LEVAQUIN) 750 MG tablet Take 1 tablet (750 mg total) by mouth every other day. Start taking on 08/28/17. 08/26/17   Renne CriglerGeiple, Joshua, PA-C  LORazepam (ATIVAN) 1 MG tablet Take 1 tablet (1 mg total) by mouth 3 (three) times daily as needed for anxiety. 04/13/17   Dione BoozeGlick, Kartier, MD  Multiple Vitamin (MULTIVITAMIN WITH MINERALS) TABS tablet Take 1 tablet by mouth daily.    [provider]  predniSONE (DELTASONE) 20 MG tablet 2 tabs po daily x 4 days 08/11/17   Melene PlanFloyd, Dan, DO    Family History Family History  Problem Relation Age of Onset  . Colon cancer Mother   . Cerebral aneurysm Father     Social History Social History   Tobacco Use  . Smoking status: Current Every Day Smoker    Packs/day: 0.25    Years: 60.00    Pack years: 15.00    Types: Cigarettes  . Smokeless tobacco: Never Used  Substance Use Topics  . Alcohol use: No  . Drug use: No     Allergies   Penicillins; Amoxicillin; and Sulfa antibiotics   Review of Systems Review of Systems  Constitutional: Negative for fever.  Respiratory: Positive for shortness of breath. Negative for sputum production.   Cardiovascular: Negative for chest pain.  All other systems reviewed and are negative.    Physical Exam Updated Vital Signs BP (!) 149/79 (BP Location: Left Arm)   Pulse 86   Temp (!) 97.4 F (36.3 C) (Oral)   Resp (!) 26   Ht 5\' 8"  (1.727 m)   Wt 62.6 kg (138 lb)   SpO2 98%   BMI 20.98 kg/m   Physical Exam  Constitutional: He is oriented to person, place, and time. He appears well-developed and well-nourished. No distress.  HENT:  Head: Normocephalic and atraumatic.  Mouth/Throat: Oropharynx is clear and moist.  Neck: Normal range of motion. Neck supple.    Cardiovascular: Normal rate and regular rhythm. Exam reveals no friction rub.  No murmur heard. Pulmonary/Chest: Effort normal. No respiratory distress. He has no wheezes. He has rales in the right lower field and the left lower field.  Abdominal: Soft. Bowel sounds are normal. He exhibits no distension. There is no tenderness.  Musculoskeletal: Normal range of motion.       Right lower leg: He exhibits edema.       Left lower leg: He exhibits edema.  There is 2-3+ pitting edema of both lower extremities.  Neurological: He is alert and oriented to person, place, and time. Coordination normal.  Skin: Skin is warm and dry. He is not diaphoretic.  Nursing note  and vitals reviewed.    ED Treatments / Results  Labs (all labs ordered are listed, but only abnormal results are displayed) Labs Reviewed  BASIC METABOLIC PANEL - Abnormal; Notable for the following components:      Result Value   Glucose, Bld 115 (*)    Creatinine, Ser 1.45 (*)    GFR calc non Af Amer 44 (*)    GFR calc Af Amer 51 (*)    All other components within normal limits  CBC  BRAIN NATRIURETIC PEPTIDE  I-STAT TROPONIN, ED    EKG  EKG Interpretation None       Radiology No results found.  Procedures Procedures (including critical care time)  Medications Ordered in ED Medications  methylPREDNISolone sodium succinate (SOLU-MEDROL) 125 mg/2 mL injection 80 mg (not administered)  ipratropium-albuterol (DUONEB) 0.5-2.5 (3) MG/3ML nebulizer solution 3 mL (not administered)  LORazepam (ATIVAN) injection 1 mg (not administered)     Initial Impression / Assessment and Plan / ED Course  I have reviewed the triage vital signs and the nursing notes.  Pertinent labs & imaging results that were available during my care of the patient were reviewed by me and considered in my medical decision making (see chart for details).  Patient presents with wheezing and difficulty breathing. He has a history of COPD and  has not had much relief with home nebulizers. He was given breathing treatments in route by EMS as well as steroids. He continues to be dyspneic and borderline hypoxic with oxygen saturations in the upper 80s/low 90s on room air.  I feel as though the patient will require admission for repeat breathing treatments and steroids and further workup as indicated. I've spoken with Dr. Julian Reil who agrees to admit.  Final Clinical Impressions(s) / ED Diagnoses   Final diagnoses:  None    ED Discharge Orders    None       Geoffery Lyons, MD 09/07/17 [id number]

## 2017-09-07 NOTE — ED Notes (Signed)
Bed: RESB Expected date:  Expected time:  Means of arrival:  Comments: EMS 
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
## 2017-09-07 NOTE — H&P (Signed)
History and Physical    David Duke ZOX:[id number] DOB: 11/12/1934 DOA: 09/07/2017  PCP: Annita BrodAsenso, Philip, MD   I have briefly reviewed patients [facility].  Patient coming from: Home  Chief Complaint: Productive cough, shortness of breath increased wheezing.  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  Patient symptoms has been present for the last 2 days or so and worsening; he reported associated productive cough along with shortness of breath and wheezing.  Despite the use of albuterol nebulizations at home he continued to experience difficulty breathing and notice significant decrease in his Ability to perform activities due to the shortness of breath. Patient denies chest pain, fever, chills, nausea, vomiting, abdominal pain, dysuria, hematuria, hematochezia, melena, hematemesis, hemoptysis or any other acute complaints.  ED Course: Chest x-ray without acute infiltrates, IV Solu-Medrol and albuterol nebulization given; patient started on 2 L nasal cannula TR[facility] called to admit the patient for further evaluation and treatment.  Review of Systems:  All other systems reviewed and apart from HPI, are negative.  Past Medical History:  Diagnosis Date  . AKI (acute kidney injury) (HCC)   . Anxiety   . Blind right eye   . BPH (benign prostatic hypertrophy) with urinary retention   . COPD (chronic obstructive pulmonary disease) (HCC)   . Dementia   . Depression   . High cholesterol   . Macular degeneration of both  eyes   . Renal disorder   . Shortness of breath dyspnea   . Stroke [facility](HCC)    has residual slurred speech    Past Surgical History:  Procedure Laterality Date  . NO PAST SURGERIES      Social History  reports that he has been smoking cigarettes.  He has a 15.00 pack-year smoking history. he has never used smokeless tobacco. He reports that he does not drink alcohol or use drugs.  Allergies  Allergen Reactions  . Penicillins Hives, Diarrhea and Nausea And Vomiting    Has patient had a PCN reaction causing immediate rash, facial/tongue/throat swelling, SOB or lightheadedness with hypotension: Yes Has patient had a PCN reaction causing severe rash involving mucus membranes or skin necrosis: No Has patient had a PCN reaction that required hospitalization: No Has patient had a PCN reaction occurring within the last 10 years: No If all of the above answers are "NO", then may proceed with Cephalosporin use.   [name]. Amoxicillin Hives, Diarrhea and Nausea And Vomiting  . Sulfa Antibiotics Nausea Only    Family History  Problem Relation Age of Onset  . Colon cancer Mother   . Cerebral aneurysm Father      Prior to Admission medications   Medication Sig Start Date End Date Taking? Authorizing Provider  albuterol (ACCUNEB) 0.63 MG/3ML nebulizer solution Take 1 ampule by nebulization every 6 (six) hours as needed for wheezing.   Yes [provider]  albuterol (PROVENTIL HFA;VENTOLIN HFA) 108 (90 Base) MCG/ACT inhaler Inhale 1-2 puffs into the lungs every 6 (six) hours as needed for wheezing or shortness of breath. 12/28/16  Yes Long, Arlyss RepressJoshua G, MD  busPIRone (BUSPAR) 5  MG tablet Take 5 mg by mouth 2 (two) times daily.   Yes [provider]  diazepam (VALIUM) 5 MG tablet Take 1 tablet (5 mg total) by mouth every 8 (eight) hours as needed for anxiety. Can fill no sooner than October 24, 2016. Patient taking differently: Take 5 mg by mouth 4 (four) times daily.  10/20/16  Yes  Saguier, Ramon Dredge, PA-C  finasteride (PROSCAR) 5 MG tablet Take 5 mg by mouth daily. 09/22/16  Yes [provider]  fluticasone (FLONASE) 50 MCG/ACT nasal spray Place 1 spray into both nostrils daily. Patient taking differently: Place 1 spray into both nostrils daily as needed for allergies or rhinitis.  12/11/15  Yes Love, [name], PA-C  lansoprazole (PREVACID) 30 MG capsule Take 30 mg by mouth daily after breakfast.   Yes [provider]  Multiple Vitamin (MULTIVITAMIN WITH MINERALS) TABS tablet Take 1 tablet by mouth daily.   Yes [provider]  guaiFENesin (MUCINEX) 600 MG 12 hr tablet Take 2 tablets (1,200 mg total) by mouth 2 (two) times daily. Patient not taking: Reported on 09/07/2017 06/21/17   Arthor Captain, PA-C    Physical Exam: Vitals:   09/07/17 0210 09/07/17 0211 09/07/17 0626 09/07/17 0630  BP: (!) 149/79  (!) 145/97 134/81  Pulse: 86  (!) 102 98  Resp: (!) 26  (!) 22 (!) 37  Temp: (!) 97.4 F (36.3 C)  (!) 97.5 F (36.4 C)   TempSrc: Oral  Oral   SpO2: 98% 98% 94% (!) 89%  Weight: 62.6 kg (138 lb)     Height: 5\' 8"  (1.727 m)      Constitutional: Afebrile, denying chest pain, abdominal pain, nausea and vomiting.  Patient with broken speech, mild tachypnea and wearing 2 L nasal cannula. Eyes: PERTLA, lids and conjunctivae normal, no icterus, no nystagmus. ENMT: Mucous membranes are moist. Posterior pharynx clear of any exudate or lesions. Normal dentition.  Neck: supple, no masses, no thyromegaly; no JVD Respiratory: Decreased air movement, positive rhonchi, diffuse expiratory wheezing; no crackles; no using accessory muscles.   Cardiovascular: S1 & S2 heard, regular rate and rhythm, no murmurs / rubs / gallops. 2+ pedal pulses. No carotid bruits.  Abdomen: No distension, no tenderness, no masses palpated. No hepatosplenomegaly. Bowel sounds normal.  Musculoskeletal: no clubbing / cyanosis. No joint deformity upper and lower extremities. Good  ROM, Normal muscle tone.  Patient with trace edema bilaterally up to his knees. Skin: no rashes, lesions, ulcers. No induration Neurologic: CN 2-12 grossly intact. Overall with intact sensation, strength 4/5 in all 4 limbs.  No new focal deficit.  Patient with some residual aphasia Psychiatric: Normal insight. Alert and oriented x 3. Normal mood.  No suicidal ideation or hallucinations.   Labs on Admission: I have personally reviewed following labs and imaging studies  CBC: Recent Labs  Lab 09/07/17 0213  WBC 10.0  HGB 14.3  HCT 42.2  MCV 91.3  PLT 256   Basic Metabolic Panel: Recent Labs  Lab 09/07/17 0213  NA 139  K 4.0  CL 105  CO2 26  GLUCOSE 115*  BUN 11  CREATININE 1.45*  CALCIUM 9.0   Urine analysis:    Component Value Date/Time   COLORURINE YELLOW 08/26/2017 1719   APPEARANCEUR CLOUDY (A) 08/26/2017 1719   LABSPEC 1.010 08/26/2017 1719   PHURINE 7.0 08/26/2017 1719   GLUCOSEU NEGATIVE 08/26/2017 1719   HGBUR SMALL (A) 08/26/2017 1719   BILIRUBINUR NEGATIVE 08/26/2017 1719   KETONESUR NEGATIVE 08/26/2017  1719   PROTEINUR NEGATIVE 08/26/2017 1719   UROBILINOGEN 0.2 03/31/2015 1850   NITRITE POSITIVE (A) 08/26/2017 1719   LEUKOCYTESUR LARGE (A) 08/26/2017 1719    Radiological Exams on Admission: Dg Chest 2 View  Result Date: 09/07/2017 CLINICAL DATA:  Shortness of breath, COPD, cough, and congestion. EXAM: CHEST  2 VIEW COMPARISON:  08/23/2017 FINDINGS: Mild hyperinflation. Normal heart size and pulmonary vascularity. No focal airspace disease or consolidation in the lungs. No blunting of costophrenic angles. No pneumothorax. Mediastinal contours appear intact. Degenerative changes in the spine. Anterior compression of a midthoracic vertebra, chronic. IMPRESSION: No active cardiopulmonary disease. Electronically Signed   By: Burman NievesWilliam  Stevens M.D.   On: 09/07/2017 03:56    EKG:  -ordered and pending. Telemetry monitor reviewed while in ED and no acute  ischemic changes appreciated.  Assessment/Plan 1-acute respiratory failure with hypoxia (HCC): Due to COPD exacerbation. -Patient with oxygen saturation in 88-89% on room air while resting and even lower with minimal exertion.  Patient with diffuse wheezing, mild tachypnea and broken speech. -Will admit to the hospital and initiate treatment with IV steroids, DuoNeb, doxycycline, Pulmicort, flutter valve and Mucinex. -Oxygen supplementation as needed weaning down to room air as tolerated -Patient will benefit of outpatient follow-up with a pulmonologist for formal pulmonary function test and further adjustment in his maintenance treatment for COPD. -Follow clinical response.  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  4-chronic urinary retention with Foley catheter dependency/BPH -Just recently finished treatment for UTI and has Foley catheter exchange (approximately 2 weeks ago) -No complaining of dysuria, hematuria or suprapubic tenderness at this moment -will continue Proscar    5-Adjustment disorder with mixed anxiety and depressed mood -As mentioned above we will continue Valium and BuSpar -Patient with a stable mood currently and not experiencing hallucination or suicidal ideation.  6-Tobacco abuse: -Smoking cessation counseling has been provided -Patient has declined the use of nicotine patch.  7-Rhinitis, chronic -Continue Flonase and will add daily loratadine.  8-GERD -continue PPI  9-CKD stage 3 -Baseline creatinine around 1.4 -Currently stable -Follow creatinine trend  Time: 60 minutes   DVT prophylaxis: Heparin Code Status: Full code Family Communication: Wife at bedside. Disposition  Plan: Home when medically stable Consults called: None Admission status: Length of state anticipated to be more than 2 midnights; MedSurg bed, inpatient status.   Vassie Lollarlos Nataleigh Griffin MD [facility] Pager [phone]  If 7PM-7AM, please contact night-coverage [email or web address] Password [email or web address]  09/07/2017, 8:39 AM

## 2017-09-07 NOTE — ED Notes (Signed)
ED TO INPATIENT HANDOFF REPORT  Name/Age/Gender David Duke 82 y.o. male  Code Status Code Status History    Date Active Date Inactive Code Status Order ID Comments User Context   03/27/2017 18:54 03/28/2017 20:48 Full Code [id number]  Shela Leff, MD Inpatient   09/30/2016 05:05 10/01/2016 17:36 Full Code [id number]  Ivor Costa, MD ED   06/10/2016 01:07 06/10/2016 18:57 Full Code [id number]  Varney Biles, MD ED   11/28/2015 16:30 12/11/2015 15:40 Full Code [id number]  Bary Leriche, PA-C Inpatient   11/28/2015 15:58 11/28/2015 16:30 Full Code [id number]  Bary Leriche, PA-C Inpatient   11/22/2015 18:11 11/28/2015 15:58 Full Code [id number]  Maisie Fus, MD ED      Home/SNF/Other Home  Chief Complaint Shortness of Breath; COPD  Level of Care/Admitting Diagnosis ED Disposition    ED Disposition Condition [place]: [facility] [[id number]]  Level of Care: Med-Surg [16]  Diagnosis: COPD exacerbation [facility]) [[id number]]  Admitting Physician: Etta Quill [phone]  Attending Physician: Etta Quill [[id number]]  PT Class (Do Not Modify): Observation [104]  PT Acc Code (Do Not Modify): Observation [10022]       Medical History Past Medical History:  Diagnosis Date  . AKI (acute kidney injury) ([place])   . Anxiety   . Blind right eye   . BPH (benign prostatic hypertrophy) with urinary retention   . COPD (chronic obstructive pulmonary disease) ([place])   . Dementia   . Depression   . High cholesterol   . Macular degeneration of both eyes   . Renal disorder   . Shortness of breath dyspnea   . Stroke [facility])    has residual slurred speech    Allergies Allergies  Allergen Reactions  . Penicillins Hives, Diarrhea and Nausea And Vomiting    Has patient had a PCN reaction causing immediate rash, facial/tongue/throat swelling, SOB or lightheadedness with hypotension: Yes Has patient had a PCN reaction causing severe rash involving  mucus membranes or skin necrosis: No Has patient had a PCN reaction that required hospitalization: No Has patient had a PCN reaction occurring within the last 10 years: No If all of the above answers are "NO", then may proceed with Cephalosporin use.   [name] Amoxicillin Hives, Diarrhea and Nausea And Vomiting  . Sulfa Antibiotics Nausea Only    IV Location/Drains/Wounds Patient Lines/Drains/Airways Status   Active Line/Drains/Airways    Name:   Placement date:   Placement time:   Site:   Days:   Urethral Catheter [id number] Coude 16 Fr.   08/26/17    1900    Coude   12          Labs/Imaging Results for orders placed or performed during the hospital encounter of 09/07/17 (from the past 48 hour(s))  Basic metabolic panel     Status: Abnormal   Collection Time: 09/07/17  2:13 AM  Result Value Ref Range   Sodium 139 135 - 145 mmol/L   Potassium 4.0 3.5 - 5.1 mmol/L   Chloride 105 101 - 111 mmol/L   CO2 26 22 - 32 mmol/L   Glucose, Bld 115 (H) 65 - 99 mg/dL   BUN 11 6 - 20 mg/dL   Creatinine, Ser 1.45 (H) 0.61 - 1.24 mg/dL   Calcium 9.0 8.9 - 10.3 mg/dL   GFR calc non Af Amer 44 (L) >60 mL/min   GFR calc Af Amer 51 (L) >60 mL/min    Comment: (  NOTE) The eGFR has been calculated using the CKD EPI equation. This calculation has not been validated in all clinical situations. eGFR's persistently <60 mL/min signify possible Chronic Kidney Disease.    Anion gap 8 5 - 15  CBC     Status: None   Collection Time: 09/07/17  2:13 AM  Result Value Ref Range   WBC 10.0 4.0 - 10.5 K/uL   RBC 4.62 4.22 - 5.81 MIL/uL   Hemoglobin 14.3 13.0 - 17.0 g/dL   HCT 42.2 39.0 - 52.0 %   MCV 91.3 78.0 - 100.0 fL   MCH 31.0 26.0 - 34.0 pg   MCHC 33.9 30.0 - 36.0 g/dL   RDW 13.7 11.5 - 15.5 %   Platelets 256 150 - 400 K/uL  Brain natriuretic peptide     Status: None   Collection Time: 09/07/17  2:13 AM  Result Value Ref Range   B Natriuretic Peptide 24.3 0.0 - 100.0 pg/mL  I-stat troponin, ED      Status: None   Collection Time: 09/07/17  3:21 AM  Result Value Ref Range   Troponin i, poc 0.01 0.00 - 0.08 ng/mL   Comment 3            Comment: Due to the release kinetics of cTnI, a negative result within the first hours of the onset of symptoms does not rule out myocardial infarction with certainty. If myocardial infarction is still suspected, repeat the test at appropriate intervals.    Dg Chest 2 View  Result Date: 09/07/2017 CLINICAL DATA:  Shortness of breath, COPD, cough, and congestion. EXAM: CHEST  2 VIEW COMPARISON:  08/23/2017 FINDINGS: Mild hyperinflation. Normal heart size and pulmonary vascularity. No focal airspace disease or consolidation in the lungs. No blunting of costophrenic angles. No pneumothorax. Mediastinal contours appear intact. Degenerative changes in the spine. Anterior compression of a midthoracic vertebra, chronic. IMPRESSION: No active cardiopulmonary disease. Electronically Signed   By: Lucienne Capers M.D.   On: 09/07/2017 03:56    Pending Labs Unresulted Labs (From admission, onward)   None      Vitals/Pain Today's Vitals   09/07/17 0210 09/07/17 0211 09/07/17 0626  BP: (!) 149/79  (!) 145/97  Pulse: 86  (!) 102  Resp: (!) 26  (!) 22  Temp: (!) 97.4 F (36.3 C)  (!) 97.5 F (36.4 C)  TempSrc: Oral  Oral  SpO2: 98% 98% 94%  Weight: 138 lb (62.6 kg)    Height: 5' 8"  (1.727 m)    PainSc:   0-No pain    Isolation Precautions No active isolations  Medications Medications  LORazepam (ATIVAN) injection 1 mg (1 mg Intravenous Given 09/07/17 0325)    Mobility

## 2017-09-07 NOTE — ED Triage Notes (Signed)
Pt comes to ed, via ems, c/o COPD, recent cough, and congestions. Pt comes from home, vs on arrival 154/88, hr 80, rr20, spo2 100 neb tx 8 liters. Pt took 4 treatments of his home albuterol in last 15hrs. Ems arrives, gave 3 total breathing tx deo nub 5 albuterol w 0.5 Atrovent x2, albuterol 5mg . 125mg   Solumedrol.    Was having a panic attack before arriving.

## 2017-09-08 LAB — BASIC METABOLIC PANEL
ANION GAP: 5 (ref 5–15)
BUN: 21 mg/dL — ABNORMAL HIGH (ref 6–20)
CO2: 24 mmol/L (ref 22–32)
Calcium: 8.8 mg/dL — ABNORMAL LOW (ref 8.9–10.3)
Chloride: 109 mmol/L (ref 101–111)
Creatinine, Ser: 1.36 mg/dL — ABNORMAL HIGH (ref 0.61–1.24)
GFR, EST AFRICAN AMERICAN: 55 mL/min — AB (ref >60)
GFR, EST NON AFRICAN AMERICAN: 48 mL/min — AB (ref >60)
GLUCOSE: 141 mg/dL — AB (ref 65–99)
POTASSIUM: 4.2 mmol/L (ref 3.5–5.1)
Sodium: 138 mmol/L (ref 135–145)

## 2017-09-08 LAB — CBC
HEMATOCRIT: 39.3 % (ref 39.0–52.0)
HEMOGLOBIN: 13.3 g/dL (ref 13.0–17.0)
MCH: 30.8 pg (ref 26.0–34.0)
MCHC: 33.8 g/dL (ref 30.0–36.0)
MCV: 91 fL (ref 78.0–100.0)
Platelets: 271 10*3/uL (ref 150–400)
RBC: 4.32 MIL/uL (ref 4.22–5.81)
RDW: 13.6 % (ref 11.5–15.5)
WBC: 20.7 10*3/uL — ABNORMAL HIGH (ref 4.0–10.5)

## 2017-09-08 MED ORDER — DIAZEPAM 5 MG PO TABS
5.0000 mg | ORAL_TABLET | Freq: Four times a day (QID) | ORAL | Status: DC
  Administered 2017-09-08 – 2017-09-11 (×14): 5 mg via ORAL
  Filled 2017-09-08 (×14): qty 1

## 2017-09-08 MED ORDER — HALOPERIDOL LACTATE 5 MG/ML IJ SOLN
2.0000 mg | Freq: Four times a day (QID) | INTRAMUSCULAR | Status: DC | PRN
  Administered 2017-09-10: 2 mg via INTRAVENOUS
  Administered 2017-09-10: 11:00:00 via INTRAVENOUS
  Filled 2017-09-08 (×2): qty 1

## 2017-09-08 NOTE — Progress Notes (Signed)
Rt gave pt flutter valve. Pt knows and understands how to use. 

## 2017-09-08 NOTE — Progress Notes (Signed)
[facility] PROGRESS NOTE  David Duke ZOX:[id number] DOB: 11-11-1934 DOA: 09/07/2017  PCP: David Brod, MD  Brief History/Interval Summary: 83 year old Caucasian male with a past medical history of tobacco abuse, anxiety and depression, chronic BPH with urinary retention with chronic Foley, chronic kidney disease stage III, prior history of hemorrhagic stroke, vascular dementia who presented with complains of shortness of breath and wheezing.  Patient was thought to have acute COPD exacerbation.  Patient with borderline low oxygen saturations.  He was hospitalized for further management.  Reason for Visit: Acute COPD exacerbation  Consultants: None  Procedures: None  Antibiotics: Doxycycline  Subjective/Interval History: Patient noted to be confused this morning.  Somewhat agitated.  Upset at being in the hospital.  Complaints of breathing difficulties.  Denies any chest pain.  Difficult historian.  ROS: Denies any nausea vomiting  Objective:  Vital Signs  Vitals:   09/07/17 2057 09/08/17 0423 09/08/17 0826 09/08/17 0831  BP:  134/68    Pulse:  92    Resp:  16    Temp:  98.2 F (36.8 C)    TempSrc:  Axillary    SpO2: 94% 95% 94% 94%  Weight:      Height:        Intake/Output Summary (Last 24 hours) at 09/08/2017 1115 Last data filed at 09/08/2017 1031 Gross per 24 hour  Intake 1078.75 ml  Output 2175 ml  Net -1096.25 ml   Filed Weights   09/07/17 0210 09/07/17 0840  Weight: 62.6 kg (138 lb) 62 kg (136 lb 11 oz)    General appearance: alert, distracted and no distress Head: Normocephalic, without obvious abnormality, atraumatic Resp: Wheezing heard bilaterally.  Noted to be tachypneic.  Gets tachypneic while talking.  Few crackles at the bases. Cardio: regular rate and rhythm, S1, S2 normal, no murmur, click, rub or gallop GI: soft, non-tender; bowel sounds normal; no masses,  no organomegaly Extremities: extremities normal, atraumatic, no  cyanosis or edema Neurologic: No focal neurological deficit.  He is noted to be confused.  Lab Results:  Data Reviewed: I have personally reviewed following labs and imaging studies  CBC: Recent Labs  Lab 09/07/17 0213 09/08/17 0352  WBC 10.0 20.7*  HGB 14.3 13.3  HCT 42.2 39.3  MCV 91.3 91.0  PLT 256 271    Basic Metabolic Panel: Recent Labs  Lab 09/07/17 0213 09/08/17 0352  NA 139 138  K 4.0 4.2  CL 105 109  CO2 26 24  GLUCOSE 115* 141*  BUN 11 21*  CREATININE 1.45* 1.36*  CALCIUM 9.0 8.8*  MG 1.9  --     GFR: Estimated Creatinine Clearance: 38 mL/min (A) (by C-G formula based on SCr of 1.36 mg/dL (H)).   Radiology Studies: Dg Chest 2 View  Result Date: 09/07/2017 CLINICAL DATA:  Shortness of breath, COPD, cough, and congestion. EXAM: CHEST  2 VIEW COMPARISON:  08/23/2017 FINDINGS: Mild hyperinflation. Normal heart size and pulmonary vascularity. No focal airspace disease or consolidation in the lungs. No blunting of costophrenic angles. No pneumothorax. Mediastinal contours appear intact. Degenerative changes in the spine. Anterior compression of a midthoracic vertebra, chronic. IMPRESSION: No active cardiopulmonary disease. Electronically Signed   By: Burman Nieves M.D.   On: 09/07/2017 03:56     Medications:  Scheduled: . budesonide (PULMICORT) nebulizer solution  0.5 mg Nebulization BID  . busPIRone  5 mg Oral TID  . diazepam  5 mg Oral Q6H  . doxycycline  100 mg Oral Q12H  .  finasteride  5 mg Oral Daily  . fluticasone  1 spray Each Nare Daily  . guaiFENesin  600 mg Oral BID  . heparin  5,000 Units Subcutaneous Q8H  . ipratropium-albuterol  3 mL Nebulization TID  . loratadine  10 mg Oral Daily  . methylPREDNISolone (SOLU-MEDROL) injection  60 mg Intravenous Q8H  . multivitamin with minerals  1 tablet Oral Daily  . 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  Patient was saturating 88-89% on room air while in the emergency department.  Patient was hospitalized.  Started on IV steroids antibiotics nebulizer treatments.  He is slowly improving.  Wean down oxygen as able.  Keep saturations 90% or above.  Acute COPD exacerbation Continue current treatment with bronchodilators, Pulmicort, IV steroids doxycycline.  Patient with extensive history of tobacco use.  Continues to have wheezing.  Continues to be tachypneic.  Prior history of hemorrhagic stroke Stable.  Vascular dementia Noted to be somewhat agitated this morning.  Likely due to new surroundings in the setting of dementia.  Does not have any focal neurological deficits.  Reorient.  Use calming measures.  Chronic kidney disease stage III Baseline creatinine around 1.4.  Currently stable.  Continue to monitor urine output.  History of BPH with chronic urinary retention with chronic Foley Recently finished treatment for UTI.  Last Foley catheter exchange was 2 weeks ago.  We will continue to monitor urine output.  Continue Proscar.  History of adjustment disorder with mixed anxiety and depression Continue home medications.  Tobacco abuse Counseling has been provided.  Patient declined nicotine patch.  Chronic rhinitis Continue with nasal spray.  GERD Continue PPI.  DVT Prophylaxis: Subcutaneous heparin    Code Status: Full code Family Communication: Discussed with the patient.   No family at bedside. Disposition Plan: Management as outlined above.    LOS: 1 day   David Duke  [facility] Pager [phone] 09/08/2017, 11:15 AM  If 7PM-7AM, please contact night-coverage at [email or web address], password [email or web address]

## 2017-09-09 DIAGNOSIS — F0151 Vascular dementia with behavioral disturbance: Secondary | ICD-10-CM

## 2017-09-09 LAB — CBC
HEMATOCRIT: 41.9 % (ref 39.0–52.0)
HEMOGLOBIN: 14 g/dL (ref 13.0–17.0)
MCH: 30.3 pg (ref 26.0–34.0)
MCHC: 33.4 g/dL (ref 30.0–36.0)
MCV: 90.7 fL (ref 78.0–100.0)
Platelets: 291 10*3/uL (ref 150–400)
RBC: 4.62 MIL/uL (ref 4.22–5.81)
RDW: 13.7 % (ref 11.5–15.5)
WBC: 20.9 10*3/uL — ABNORMAL HIGH (ref 4.0–10.5)

## 2017-09-09 LAB — BASIC METABOLIC PANEL
Anion gap: 8 (ref 5–15)
BUN: 29 mg/dL — AB (ref 6–20)
CHLORIDE: 107 mmol/L (ref 101–111)
CO2: 25 mmol/L (ref 22–32)
CREATININE: 1.44 mg/dL — AB (ref 0.61–1.24)
Calcium: 8.8 mg/dL — ABNORMAL LOW (ref 8.9–10.3)
GFR calc Af Amer: 51 mL/min — ABNORMAL LOW (ref >60)
GFR calc non Af Amer: 44 mL/min — ABNORMAL LOW (ref >60)
GLUCOSE: 110 mg/dL — AB (ref 65–99)
Potassium: 4 mmol/L (ref 3.5–5.1)
Sodium: 140 mmol/L (ref 135–145)

## 2017-09-09 MED ORDER — METHYLPREDNISOLONE SODIUM SUCC 125 MG IJ SOLR
60.0000 mg | Freq: Two times a day (BID) | INTRAMUSCULAR | Status: DC
  Administered 2017-09-09: 125 mg via INTRAVENOUS
  Administered 2017-09-10 – 2017-09-11 (×3): 60 mg via INTRAVENOUS
  Filled 2017-09-09 (×4): qty 2

## 2017-09-09 MED ORDER — IPRATROPIUM-ALBUTEROL 0.5-2.5 (3) MG/3ML IN SOLN
3.0000 mL | Freq: Two times a day (BID) | RESPIRATORY_TRACT | Status: DC
  Administered 2017-09-09 – 2017-09-11 (×4): 3 mL via RESPIRATORY_TRACT
  Filled 2017-09-09 (×4): qty 3

## 2017-09-09 NOTE — Progress Notes (Signed)
[facility] PROGRESS NOTE  David Duke ZOX:[id number] DOB: Mar 30, 1935 DOA: 09/07/2017  PCP: David Brod, MD  Brief History/Interval Summary: 83 year old Caucasian male with a past medical history of tobacco abuse, anxiety and depression, chronic BPH with urinary retention with chronic Foley, chronic kidney disease stage III, prior history of hemorrhagic stroke, vascular dementia who presented with complains of shortness of breath and wheezing.  Patient was thought to have acute COPD exacerbation.  Patient with borderline low oxygen saturations.  He was hospitalized for further management.  Patient is slowly improving though not completely back to his baseline.  Reason for Visit: Acute COPD exacerbation  Consultants: None  Procedures: None  Antibiotics: Doxycycline  Subjective/Interval History: Patient remains confused.  Noted to be anxious at times.  States that his breathing is improved but keeps talking about his Valium and other medications.  Complains of some nausea this morning but has not had any vomiting.  Patient's wife is at the bedside.    ROS: Denies any chest pain.  Objective:  Vital Signs  Vitals:   09/08/17 1339 09/08/17 1935 09/08/17 2031 09/09/17 0411  BP: 113/64  114/69 128/63  Pulse: 89  92 78  Resp: 18  14 12   Temp: 97.9 F (36.6 C)  98.3 F (36.8 C) 98.4 F (36.9 C)  TempSrc: Oral  Oral Oral  SpO2: 97% 96% 96% 99%  Weight:      Height:        Intake/Output Summary (Last 24 hours) at 09/09/2017 0835 Last data filed at 09/09/2017 0412 Gross per 24 hour  Intake 480 ml  Output 2650 ml  Net -2170 ml   Filed Weights   09/07/17 0210 09/07/17 0840  Weight: 62.6 kg (138 lb) 62 kg (136 lb 11 oz)    General appearance: Awake alert.  In no distress. Resp: Air entry appears to be improving.  He continues to have wheezing though less than before.  Tachypnea has improved.  Few crackles at the bases.   Cardio: S1-S2 is noted to be normal  regular.  No S3 or S4.  No rubs murmurs or bruit GI: Abdomen remains soft.  Nontender nondistended.  Bowel sounds are present normal. Extremities: No edema Neurologic: Remains confused anxious.  Needs to be redirected.  However no other focal neurological deficits noted.  Lab Results:  Data Reviewed: I have personally reviewed following labs and imaging studies  CBC: Recent Labs  Lab 09/07/17 0213 09/08/17 0352 09/09/17 0358  WBC 10.0 20.7* 20.9*  HGB 14.3 13.3 14.0  HCT 42.2 39.3 41.9  MCV 91.3 91.0 90.7  PLT 256 271 291    Basic Metabolic Panel: Recent Labs  Lab 09/07/17 0213 09/08/17 0352 09/09/17 0358  NA 139 138 140  K 4.0 4.2 4.0  CL 105 109 107  CO2 26 24 25   GLUCOSE 115* 141* 110*  BUN 11 21* 29*  CREATININE 1.45* 1.36* 1.44*  CALCIUM 9.0 8.8* 8.8*  MG 1.9  --   --     GFR: Estimated Creatinine Clearance: 35.9 mL/min (A) (by C-G formula based on SCr of 1.44 mg/dL (H)).   Radiology Studies: No results found.   Medications:  Scheduled: . budesonide (PULMICORT) nebulizer solution  0.5 mg Nebulization BID  . busPIRone  5 mg Oral TID  . diazepam  5 mg Oral Q6H  . doxycycline  100 mg Oral Q12H  . finasteride  5 mg Oral Daily  . fluticasone  1 spray Each Nare Daily  . guaiFENesin  600 mg Oral BID  . heparin  5,000 Units Subcutaneous Q8H  . ipratropium-albuterol  3 mL Nebulization TID  . loratadine  10 mg Oral Daily  . methylPREDNISolone (SOLU-MEDROL) injection  60 mg Intravenous Q12H  . multivitamin with minerals  1 tablet Oral Daily  . 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  Patient was saturating 88-89% on room air while in the emergency department.  Patient was hospitalized.  Started on IV steroids, antibiotics, nebulizer treatments.  Patient is slowly improving.  Continue current treatment for now.  Cut back on dose of steroids. Wean down oxygen as able.  Keep saturations 90% or above.  Acute COPD exacerbation Continue current treatment with bronchodilators, Pulmicort, IV steroids doxycycline.  Patient with extensive history of tobacco use.  Wheezing is improving.  Cut back on steroids.  Prior history of hemorrhagic stroke Stable.  Vascular dementia Remains somewhat agitated at times.  Needs to be redirected.  According to wife this is close to his baseline perhaps slightly worse because he is in the hospital.  No neurological deficits appreciated otherwise.  Use calming measures.  Reorient and Redirect.    Chronic kidney disease stage III Baseline creatinine around 1.4.  Currently stable.  Continue to monitor urine output.  History of BPH with chronic urinary retention with chronic Foley Recently finished treatment for UTI.  Last Foley catheter exchange was 2 weeks ago.  We will continue to monitor urine output.  Continue Proscar.  History of adjustment disorder with mixed anxiety and depression Continue home medications.  Tobacco abuse Counseling has been provided.  Patient declined nicotine patch.  Chronic rhinitis Continue with nasal spray.  GERD Continue PPI.  DVT Prophylaxis: Subcutaneous heparin    Code Status: Full code Family Communication: Discussed with patient and his wife Disposition Plan: Management as outlined above.  PT evaluation.  Anticipate discharge in 1-2 days.    LOS: 2 days   David Duke  [facility] Pager [phone] 09/09/2017, 8:35 AM  If 7PM-7AM, please contact night-coverage at [email or web address], password [email or web address]

## 2017-09-09 NOTE — Progress Notes (Signed)
Physical Therapy Treatment Patient Details Name: David Duke MRN: [id number] DOB: Sep 02, 1935 Today's Date: 09/09/2017    History of Present Illness David Duke is a 83 year old male with a past medical history significant for tobacco abuse , anxiety/depression, chronic BPH with urinary retention (with chronic Foley dependency), chronic kidney disease ,and prior history of hemorrhagic stroke (with residual aphasia and vascular dementia); who presented to the emergency department 12/31 complaining of shortness of breath and increased wheezing.     PT Comments    Pt continues cooperative but highly distractible from task at hand. This date, pt ambulated 400' in hall but fatigued and c/o mild SOB at end of session.  Follow Up Recommendations  Home health PT     Equipment Recommendations  None recommended by PT    Recommendations for Other Services       Precautions / Restrictions Precautions Precautions: Fall Precaution Comments: monitor sats Restrictions Weight Bearing Restrictions: No    Mobility  Bed Mobility Overal bed mobility: Independent                Transfers Overall transfer level: Needs assistance Equipment used: None Transfers: Sit to/from Stand Sit to Stand: Supervision            Ambulation/Gait Ambulation/Gait assistance: Supervision;Min guard Ambulation Distance (Feet): 400 Feet Assistive device: None Gait Pattern/deviations: Step-through pattern;Decreased step length - right;Decreased step length - left;Trunk flexed;Wide base of support Gait velocity: decr Gait velocity interpretation: Below normal speed for age/gender General Gait Details: Mild instability and one LOB requiring min assist to correct.  Increased time with pt reporting mild SOB near end of session.   SaO2 maintained at 91%+ with HR max at 102   Stairs            Wheelchair Mobility    Modified Rankin (Stroke Patients Only)       Balance Overall balance  assessment: Needs assistance Sitting-balance support: Feet supported;No upper extremity supported Sitting balance-Leahy Scale: Normal     Standing balance support: No upper extremity supported Standing balance-Leahy Scale: Good                              Cognition Arousal/Alertness: Awake/alert Behavior During Therapy: WFL for tasks assessed/performed Overall Cognitive Status: History of cognitive impairments - at baseline                                 General Comments: some expressive difficulties. Spent much time giving his history of work and needing to talk to someone about his fear of being alone and specific symptoms      Exercises      General Comments        Pertinent Vitals/Pain Pain Assessment: No/denies pain    Home Living                      Prior Function            PT Goals (current goals can now be found in the care plan section) Acute Rehab PT Goals Patient Stated Goal: agreed to ambulate PT Goal Formulation: With patient Time For Goal Achievement: 09/21/17 Potential to Achieve Goals: Good Progress towards PT goals: Progressing toward goals    Frequency    Min 2X/week      PT Plan Current plan remains appropriate    Co-evaluation  AM-PAC PT "6 Clicks" Daily Activity  Outcome Measure  Difficulty turning over in bed (including adjusting bedclothes, sheets and blankets)?: None Difficulty moving from lying on back to sitting on the side of the bed? : None Difficulty sitting down on and standing up from a chair with arms (e.g., wheelchair, bedside commode, etc,.)?: A Little Help needed moving to and from a bed to chair (including a wheelchair)?: A Little Help needed walking in hospital room?: A Little Help needed climbing 3-5 steps with a railing? : A Little 6 Click Score: 20    End of Session Equipment Utilized During Treatment: Gait belt Activity Tolerance: Patient tolerated  treatment well Patient left: in bed;with call bell/phone within reach;with bed alarm set Nurse Communication: Mobility status PT Visit Diagnosis: Unsteadiness on feet (R26.81)     Time: 1340-1420 PT Time Calculation (min) (ACUTE ONLY): 40 min  Charges:  $Gait Training: 23-37 mins                    G Codes:       Pg [phone]    Yides Saidi 09/09/2017, 4:52 PM

## 2017-09-10 DIAGNOSIS — N179 Acute kidney failure, unspecified: Secondary | ICD-10-CM

## 2017-09-10 DIAGNOSIS — J9601 Acute respiratory failure with hypoxia: Secondary | ICD-10-CM

## 2017-09-10 DIAGNOSIS — N189 Chronic kidney disease, unspecified: Secondary | ICD-10-CM

## 2017-09-10 DIAGNOSIS — J441 Chronic obstructive pulmonary disease with (acute) exacerbation: Principal | ICD-10-CM

## 2017-09-10 LAB — CBC
HEMATOCRIT: 40.8 % (ref 39.0–52.0)
Hemoglobin: 13.2 g/dL (ref 13.0–17.0)
MCH: 29.9 pg (ref 26.0–34.0)
MCHC: 32.4 g/dL (ref 30.0–36.0)
MCV: 92.3 fL (ref 78.0–100.0)
PLATELETS: 244 10*3/uL (ref 150–400)
RBC: 4.42 MIL/uL (ref 4.22–5.81)
RDW: 13.8 % (ref 11.5–15.5)
WBC: 15.6 10*3/uL — ABNORMAL HIGH (ref 4.0–10.5)

## 2017-09-10 LAB — BASIC METABOLIC PANEL
Anion gap: 6 (ref 5–15)
BUN: 37 mg/dL — ABNORMAL HIGH (ref 6–20)
CALCIUM: 8.3 mg/dL — AB (ref 8.9–10.3)
CO2: 25 mmol/L (ref 22–32)
Chloride: 107 mmol/L (ref 101–111)
Creatinine, Ser: 1.6 mg/dL — ABNORMAL HIGH (ref 0.61–1.24)
GFR, EST AFRICAN AMERICAN: 45 mL/min — AB (ref >60)
GFR, EST NON AFRICAN AMERICAN: 39 mL/min — AB (ref >60)
GLUCOSE: 119 mg/dL — AB (ref 65–99)
Potassium: 4.4 mmol/L (ref 3.5–5.1)
Sodium: 138 mmol/L (ref 135–145)

## 2017-09-10 MED ORDER — SODIUM CHLORIDE 0.9 % IV SOLN
INTRAVENOUS | Status: DC
  Administered 2017-09-10 – 2017-09-11 (×3): via INTRAVENOUS

## 2017-09-10 NOTE — Care Management Important Message (Signed)
Important Message  Patient Details  Name: David BarlowDavid R Fundora MRN: [id number] Date of Birth: 11/15/1934   Medicare Important Message Given:  Yes    Caren MacadamFuller, Dona Walby 09/10/2017, 11:41 AMImportant Message  Patient Details  Name: David BarlowDavid R Foulkes MRN: [id number] Date of Birth: 03/02/1935   Medicare Important Message Given:  Yes    Caren MacadamFuller, Nathanal Hermiz 09/10/2017, 11:41 AM

## 2017-09-10 NOTE — Progress Notes (Signed)
Pt became agitated when he was told that he  had to stay in the hospital until tomorrow. He argued with his wife. He threaten to leave the hospital. He criticized  The doctors. His wife was so upset she was crying and left to go home. I administer Haldol 2 mg iv.  1330 , he was sleeping.   1430 When he awoke he asked where he wife was. I told him that she  went home. He  Stated "I made her mad at me didn't I . I am so sorry. "  1450 His wife called him and he apologized to her

## 2017-09-10 NOTE — Progress Notes (Addendum)
PROGRESS NOTE    David BarlowDavid R Duke  ZOX:[id number] DOB: 05/16/1935 DOA: 09/07/2017 PCP: Annita BrodAsenso, Philip, MD   Brief Narrative: 83 year old Caucasian male with a past medical history of tobacco abuse, anxiety and depression, chronic BPH with urinary retention with chronic Foley, chronic kidney disease stage III, prior history of hemorrhagic stroke, vascular dementia who presented with complains of shortness of breath and wheezing.  Patient was thought to have acute COPD exacerbation.  Patient with borderline low oxygen saturations.  He was hospitalized for further management.  Patient is slowly improving though not completely back to his baseline.  Assessment & Plan:  #Acute respiratory failure with hypoxia likely due to COPD exacerbation: -Was on 2 L of oxygen, plan to wean to room air.  Continue IV steroids, doxycycline, nebulizer.  Shortness of breath is better.  Continue supportive care.  #Acute COPD exacerbation: Treatment as above.  On Pulmicort, IV steroid, doxycycline. Likely switch to oral prednisone tomorrow.  #History of vascular dementia: He is oriented x3.  He however has intermittent agitation.  Continue current medication and supportive care.  Wife at bedside.  #Acute kidney injury on chronic kidney disease stage III: Baseline creatinine around 1.4.  Noticed to have mildly elevated serum creatinine level likely dehydration.  Plan to start IV fluid and repeat BMP in the morning.  Patient has chronic Foley catheter.  #History of BPH with chronic urinary retention with Foley catheter: Continue Foley catheter and Proscar.  Recommend outpatient follow-up with urologist.  #History of anxiety depression: Mood is stable. Tobacco abuse: Patient was counseled Chronic rhinitis: On Claritin History of GERD: Continue PPI  DVT prophylaxis: Heparin subcutaneous Code Status: Full code Family Communication: Patient's wife at bedside Disposition Plan: Likely discharge home  tomorrow    Consultants:   None  Procedures: None Antimicrobials: Doxycycline  Subjective: Seen and examined at bedside.  Reported shortness of breath is better as mild cough.  Patient is somewhat agitated and wanted to go home.  Has drymucousmembrane Objective: Vitals:   09/09/17 2051 09/10/17 0434 09/10/17 0936 09/10/17 0939  BP:  135/72    Pulse:  70    Resp:  14    Temp:  97.8 F (36.6 C)    TempSrc:  Oral    SpO2: 95% 96% 92% 92%  Weight:      Height:        Intake/Output Summary (Last 24 hours) at 09/10/2017 1234 Last data filed at 09/10/2017 0434 Gross per 24 hour  Intake 240 ml  Output 600 ml  Net -360 ml   Filed Weights   09/07/17 0210 09/07/17 0840  Weight: 62.6 kg (138 lb) 62 kg (136 lb 11 oz)    Examination:  General exam: Mild agitation but does not look in distress, dry mucous membrane Respiratory system: Clear to auscultation. Respiratory effort normal. No wheezing or crackle Cardiovascular system: S1 & S2 heard, RRR.  No pedal edema. Gastrointestinal system: Abdomen is nondistended, soft and nontender. Normal bowel sounds heard. Central nervous system: Alert and oriented. No focal neurological deficits. Skin: No rashes, lesions or ulcers   Data Reviewed: I have personally reviewed following labs and imaging studies  CBC: Recent Labs  Lab 09/07/17 0213 09/08/17 0352 09/09/17 0358 09/10/17 0411  WBC 10.0 20.7* 20.9* 15.6*  HGB 14.3 13.3 14.0 13.2  HCT 42.2 39.3 41.9 40.8  MCV 91.3 91.0 90.7 92.3  PLT 256 271 291 244   Basic Metabolic Panel: Recent Labs  Lab 09/07/17 0213 09/08/17 0352 09/09/17 0358 09/10/17  0411  NA 139 138 140 138  K 4.0 4.2 4.0 4.4  CL 105 109 107 107  CO2 26 24 25 25   GLUCOSE 115* 141* 110* 119*  BUN 11 21* 29* 37*  CREATININE 1.45* 1.36* 1.44* 1.60*  CALCIUM 9.0 8.8* 8.8* 8.3*  MG 1.9  --   --   --    GFR: Estimated Creatinine Clearance: 32.3 mL/min (A) (by C-G formula based on SCr of 1.6 mg/dL  (H)). Liver Function Tests: No results for input(s): AST, ALT, ALKPHOS, BILITOT, PROT, ALBUMIN in the last 168 hours. No results for input(s): LIPASE, AMYLASE in the last 168 hours. No results for input(s): AMMONIA in the last 168 hours. Coagulation Profile: No results for input(s): INR, PROTIME in the last 168 hours. Cardiac Enzymes: No results for input(s): CKTOTAL, CKMB, CKMBINDEX, TROPONINI in the last 168 hours. BNP (last 3 results) No results for input(s): PROBNP in the last 8760 hours. HbA1C: No results for input(s): HGBA1C in the last 72 hours. CBG: No results for input(s): GLUCAP in the last 168 hours. Lipid Profile: No results for input(s): CHOL, HDL, LDLCALC, TRIG, CHOLHDL, LDLDIRECT in the last 72 hours. Thyroid Function Tests: No results for input(s): TSH, T4TOTAL, FREET4, T3FREE, THYROIDAB in the last 72 hours. Anemia Panel: No results for input(s): VITAMINB12, FOLATE, FERRITIN, TIBC, IRON, RETICCTPCT in the last 72 hours. Sepsis Labs: No results for input(s): PROCALCITON, LATICACIDVEN in the last 168 hours.  No results found for this or any previous visit (from the past 240 hour(s)).       Radiology Studies: No results found.      Scheduled Meds: . budesonide (PULMICORT) nebulizer solution  0.5 mg Nebulization BID  . busPIRone  5 mg Oral TID  . diazepam  5 mg Oral Q6H  . doxycycline  100 mg Oral Q12H  . finasteride  5 mg Oral Daily  . fluticasone  1 spray Each Nare Daily  . guaiFENesin  600 mg Oral BID  . heparin  5,000 Units Subcutaneous Q8H  . ipratropium-albuterol  3 mL Nebulization BID  . loratadine  10 mg Oral Daily  . methylPREDNISolone (SOLU-MEDROL) injection  60 mg Intravenous Q12H  . multivitamin with minerals  1 tablet Oral Daily  . pantoprazole  40 mg Oral Daily   Continuous Infusions: . sodium chloride       LOS: 3 days    Dron Jaynie Collins, MD [facility] Pager [phone]  If 7PM-7AM, please contact  night-coverage [email or web address] Password [facility] 09/10/2017, 12:34 PM

## 2017-09-10 NOTE — Progress Notes (Signed)
[facility] is [facility] and [facility] alerted of new need for [facility]. [facility] need [facility]. Sandford Crazeora Joye Wesenberg RN,BSN,NCM [phone]

## 2017-09-11 DIAGNOSIS — F4323 Adjustment disorder with mixed anxiety and depressed mood: Secondary | ICD-10-CM

## 2017-09-11 LAB — RENAL FUNCTION PANEL
ALBUMIN: 3 g/dL — AB (ref 3.5–5.0)
Anion gap: 5 (ref 5–15)
BUN: 34 mg/dL — AB (ref 6–20)
CO2: 22 mmol/L (ref 22–32)
CREATININE: 1.45 mg/dL — AB (ref 0.61–1.24)
Calcium: 8.2 mg/dL — ABNORMAL LOW (ref 8.9–10.3)
Chloride: 110 mmol/L (ref 101–111)
GFR calc Af Amer: 51 mL/min — ABNORMAL LOW (ref >60)
GFR, EST NON AFRICAN AMERICAN: 44 mL/min — AB (ref >60)
GLUCOSE: 136 mg/dL — AB (ref 65–99)
PHOSPHORUS: 2.9 mg/dL (ref 2.5–4.6)
POTASSIUM: 4.1 mmol/L (ref 3.5–5.1)
SODIUM: 137 mmol/L (ref 135–145)

## 2017-09-11 MED ORDER — GUAIFENESIN ER 600 MG PO TB12: 1200.0000 mg | ORAL_TABLET | Freq: Two times a day (BID) | ORAL | 0 refills | Status: AC

## 2017-09-11 NOTE — Progress Notes (Signed)
Discharge instructions reviewed with patient and wife. Both verbalized understanding. Patient discharged via private vehicle.

## 2017-09-11 NOTE — Discharge Summary (Signed)
Physician Discharge Summary  David Duke UJW:[id number] DOB: 06/30/1935 DOA: 09/07/2017  PCP: [name], MD  Admit date: 09/07/2017 Discharge date: 09/11/2017  Admitted From:home Disposition:home  Recommendations for Outpatient Follow-up:  1. Follow up with PCP in 1-2 weeks 2. Please obtain BMP/CBC in one week   Home Health:yes Equipment/Devices:none Discharge Condition:stable CODE STATUS:full code Diet recommendation:heart healthy  Brief/Interim Summary: 83 year old Caucasian male with a past medical history of tobacco abuse, anxiety and depression, chronic BPH with urinary retention with chronic Foley, chronic kidney disease stage III, prior history of hemorrhagic stroke, vascular dementia who presented with complains of shortness of breath and wheezing. Patient was thought to have acute COPD exacerbation. Patient with borderline low oxygen saturations. He was hospitalized for further management.  #Acute respiratory failure with hypoxia likely due to COPD exacerbation: -Clinically improved.  Currently on room air.  Completed a steroid treatment.  Recommend to continue nebulization treatment and follow-up with PCP.  Denies shortness of breath and cough today.  #Acute COPD exacerbation: Treated with IV steroid, completed 5 days of oral doxycycline.  Clinically improved  #History of vascular dementia: At baseline.  Lives with his wife.  Home care PT ordered.  #Acute kidney injury on chronic kidney disease stage III: He has chronic Foley catheter.  Serum creatinine level around baseline.  Recommended to monitor BMP in a week with PCP.  #History of BPH with chronic urinary retention with Foley catheter: Continue Foley catheter and Proscar.  Recommend outpatient follow-up with urologist.  #History of anxiety depression: Mood is stable. 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   Urinary retention   Adjustment disorder with mixed anxiety and depressed mood   Acute kidney injury superimposed on chronic kidney disease (HCC)   Tobacco abuse   COPD exacerbation (HCC)   Rhinitis, chronic   Malnutrition of moderate degree    Discharge Instructions  Discharge Instructions    Call MD for:  difficulty breathing, headache or visual disturbances   Complete by:  As directed    Call MD for:  extreme fatigue   Complete by:  As directed    Call MD for:  hives   Complete by:  As directed    Call MD for:  persistant dizziness or light-headedness   Complete by:  As directed    Call MD for:  persistant nausea and vomiting   Complete by:  As directed    Call MD for:  severe uncontrolled pain   Complete by:  As directed    Call MD for:  temperature >100.4   Complete by:  As directed    Diet - low sodium heart healthy   Complete by:  As directed    Increase activity slowly   Complete by:  As directed      Allergies as of 09/11/2017      Reactions   Penicillins Hives, Diarrhea, Nausea And Vomiting   Has patient had a PCN reaction causing immediate rash, facial/tongue/throat swelling, SOB or lightheadedness with hypotension: Yes Has patient had a PCN reaction causing severe rash involving mucus membranes or skin necrosis: No Has patient had a PCN reaction that required hospitalization: No Has patient had a PCN reaction occurring within the last 10 years: No  If all of the above answers are "NO", then may proceed with Cephalosporin use.   Amoxicillin Hives, Diarrhea, Nausea And Vomiting   Sulfa Antibiotics Nausea Only      Medication List    TAKE these medications   albuterol 0.63 MG/3ML nebulizer solution Commonly known as:  ACCUNEB Take 1 ampule by nebulization every  6 (six) hours as needed for wheezing.   albuterol 108 (90 Base) MCG/ACT inhaler Commonly known as:  PROVENTIL HFA;VENTOLIN HFA Inhale 1-2 puffs into the lungs every 6 (six) hours as needed for wheezing or shortness of breath.   busPIRone 5 MG tablet Commonly known as:  BUSPAR Take 5 mg by mouth 2 (two) times daily.   diazepam 5 MG tablet Commonly known as:  VALIUM Take 1 tablet (5 mg total) by mouth every 8 (eight) hours as needed for anxiety. Can fill no sooner than October 24, 2016. What changed:    when to take this  additional instructions   finasteride 5 MG tablet Commonly known as:  PROSCAR Take 5 mg by mouth daily.   fluticasone 50 MCG/ACT nasal spray Commonly known as:  FLONASE Place 1 spray into both nostrils daily. What changed:    when to take this  reasons to take this   guaiFENesin 600 MG 12 hr tablet Commonly known as:  MUCINEX Take 2 tablets (1,200 mg total) by mouth 2 (two) times daily.   multivitamin with minerals Tabs tablet Take 1 tablet by mouth daily.   PREVACID 30 MG capsule Generic drug:  lansoprazole Take 30 mg by mouth daily after breakfast.      Follow-up Information    [name], MD. Schedule an appointment as soon as possible for a visit in 1 week(s).   Specialty:  Internal Medicine Contact information: [address] [place] [address] [phone]          Allergies  Allergen Reactions  . Penicillins Hives, Diarrhea and Nausea And Vomiting    Has patient had a PCN reaction causing immediate rash, facial/tongue/throat swelling, SOB or lightheadedness with hypotension: Yes Has patient had a PCN reaction causing severe rash involving mucus membranes or skin necrosis: No Has patient had a PCN reaction that required hospitalization: No Has patient had a PCN reaction occurring within the last 10 years: No If all of the above answers are "NO", then may proceed with Cephalosporin use.   [name] Amoxicillin Hives, Diarrhea  and Nausea And Vomiting  . Sulfa Antibiotics Nausea Only    Consultations: None   Procedures/Studies: none  Subjective: Seen and examined at bedside.  Wanted to go home.  Denies headache, dizziness, nausea vomiting chest pain shortness of breath.``````````````````````````````````````````  Discharge Exam: Vitals:   09/11/17 0522 09/11/17 0734  BP: 130/67   Pulse: 70   Resp: 20   Temp: 97.6 F (36.4 C)   SpO2: 95% 92%   Vitals:   09/10/17 2200 09/10/17 2305 09/11/17 0522 09/11/17 0734  BP:  137/66 130/67   Pulse:  69 70   Resp:  16 20   Temp:  97.9 F (36.6 C) 97.6 F (36.4 C)   TempSrc:  Oral Oral   SpO2: 96% 96% 95% 92%  Weight:      Height:        General: Pt is alert, awake, not in acute distress Cardiovascular: RRR, S1/S2 +, no rubs, no gallops Respiratory: CTA bilaterally, no wheezing, no rhonchi Abdominal: Soft, NT, ND, bowel sounds + Extremities: no edema, no cyanosis  The results of significant diagnostics from this hospitalization (including imaging, microbiology, ancillary and laboratory) are listed below for reference.     Microbiology: No results found for this or any previous visit (from the past 240 hour(s)).   Labs: BNP (last 3 results) Recent Labs    12/28/16 1748 06/21/17 1245 09/07/17 0213  BNP 24.1 40.6 24.3   Basic Metabolic Panel: Recent Labs  Lab 09/07/17 0213 09/08/17 0352 09/09/17 0358 09/10/17 0411 09/11/17 0333  NA 139 138 140 138 137  K 4.0 4.2 4.0 4.4 4.1  CL 105 109 107 107 110  CO2 26 24 25 25 22   GLUCOSE 115* 141* 110* 119* 136*  BUN 11 21* 29* 37* 34*  CREATININE 1.45* 1.36* 1.44* 1.60* 1.45*  CALCIUM 9.0 8.8* 8.8* 8.3* 8.2*  MG 1.9  --   --   --   --   PHOS  --   --   --   --  2.9   Liver Function Tests: Recent Labs  Lab 09/11/17 0333  ALBUMIN 3.0*   No results for input(s): LIPASE, AMYLASE in the last 168 hours. No results for input(s): AMMONIA in the last 168 hours. CBC: Recent Labs  Lab  09/07/17 0213 09/08/17 0352 09/09/17 0358 09/10/17 0411  WBC 10.0 20.7* 20.9* 15.6*  HGB 14.3 13.3 14.0 13.2  HCT 42.2 39.3 41.9 40.8  MCV 91.3 91.0 90.7 92.3  PLT 256 271 291 244   Cardiac Enzymes: No results for input(s): CKTOTAL, CKMB, CKMBINDEX, TROPONINI in the last 168 hours. BNP: Invalid input(s): POCBNP CBG: No results for input(s): GLUCAP in the last 168 hours. D-Dimer No results for input(s): DDIMER in the last 72 hours. Hgb A1c No results for input(s): HGBA1C in the last 72 hours. Lipid Profile No results for input(s): CHOL, HDL, LDLCALC, TRIG, CHOLHDL, LDLDIRECT in the last 72 hours. Thyroid function studies No results for input(s): TSH, T4TOTAL, T3FREE, THYROIDAB in the last 72 hours.  Invalid input(s): FREET3 Anemia work up No results for input(s): VITAMINB12, FOLATE, FERRITIN, TIBC, IRON, RETICCTPCT in the last 72 hours. Urinalysis    Component Value Date/Time   COLORURINE YELLOW 08/26/2017 1719   APPEARANCEUR CLOUDY (A) 08/26/2017 1719   LABSPEC 1.010 08/26/2017 1719   PHURINE 7.0 08/26/2017 1719   GLUCOSEU NEGATIVE 08/26/2017 1719   HGBUR SMALL (A) 08/26/2017 1719   BILIRUBINUR NEGATIVE 08/26/2017 1719   KETONESUR NEGATIVE 08/26/2017 1719   PROTEINUR NEGATIVE 08/26/2017 1719   UROBILINOGEN 0.2 03/31/2015 1850   NITRITE POSITIVE (A) 08/26/2017 1719   LEUKOCYTESUR LARGE (A) 08/26/2017 1719   Sepsis Labs Invalid input(s): PROCALCITONIN,  WBC,  LACTICIDVEN Microbiology No results found for this or any previous visit (from the past 240 hour(s)).   Time coordinating discharge: 28 minutes  SIGNED:   Maxie Barbron Prasad Cashton Hosley, MD  [facility] 09/11/2017, 10:58 AM  If 7PM-7AM, please contact night-coverage [email or web address] Password [email or web address]

## 2017-09-11 NOTE — Progress Notes (Signed)
PT demonstrated hands on understanding of Flutter device. 

## 2017-09-12 ENCOUNTER — Emergency Department (HOSPITAL_COMMUNITY)
Admission: EM | Admit: 2017-09-12 | Discharge: 2017-09-13 | Disposition: A | Discharge status: Home / Self Care | Payer: Medicare Other | Attending: Emergency Medicine | Admitting: Emergency Medicine

## 2017-09-12 ENCOUNTER — Emergency Department (HOSPITAL_COMMUNITY): Payer: Medicare Other

## 2017-09-12 ENCOUNTER — Encounter (HOSPITAL_COMMUNITY): Payer: Self-pay | Admitting: Emergency Medicine

## 2017-09-12 DIAGNOSIS — R05 Cough: Secondary | ICD-10-CM | POA: Insufficient documentation

## 2017-09-12 DIAGNOSIS — J441 Chronic obstructive pulmonary disease with (acute) exacerbation: Secondary | ICD-10-CM | POA: Diagnosis not present

## 2017-09-12 DIAGNOSIS — R0602 Shortness of breath: Secondary | ICD-10-CM | POA: Diagnosis present

## 2017-09-12 DIAGNOSIS — Z79899 Other long term (current) drug therapy: Secondary | ICD-10-CM | POA: Insufficient documentation

## 2017-09-12 DIAGNOSIS — Z882 Allergy status to sulfonamides status: Secondary | ICD-10-CM | POA: Insufficient documentation

## 2017-09-12 DIAGNOSIS — N189 Chronic kidney disease, unspecified: Secondary | ICD-10-CM | POA: Insufficient documentation

## 2017-09-12 DIAGNOSIS — Z8673 Personal history of transient ischemic attack (TIA), and cerebral infarction without residual deficits: Secondary | ICD-10-CM | POA: Diagnosis not present

## 2017-09-12 DIAGNOSIS — Z880 Allergy status to penicillin: Secondary | ICD-10-CM | POA: Insufficient documentation

## 2017-09-12 DIAGNOSIS — F0390 Unspecified dementia without behavioral disturbance: Secondary | ICD-10-CM | POA: Insufficient documentation

## 2017-09-12 DIAGNOSIS — F17210 Nicotine dependence, cigarettes, uncomplicated: Secondary | ICD-10-CM | POA: Insufficient documentation

## 2017-09-12 MED ORDER — LORAZEPAM 1 MG PO TABS
1.0000 mg | ORAL_TABLET | Freq: Once | ORAL | Status: AC
  Administered 2017-09-13: 1 mg via ORAL
  Filled 2017-09-12: qty 1

## 2017-09-12 MED ORDER — ALBUTEROL SULFATE (2.5 MG/3ML) 0.083% IN NEBU
5.0000 mg | INHALATION_SOLUTION | Freq: Once | RESPIRATORY_TRACT | Status: AC
  Administered 2017-09-12: 5 mg via RESPIRATORY_TRACT
  Filled 2017-09-12: qty 6

## 2017-09-12 MED ORDER — PREDNISONE 20 MG PO TABS
60.0000 mg | ORAL_TABLET | Freq: Once | ORAL | Status: AC
  Administered 2017-09-13: 60 mg via ORAL
  Filled 2017-09-12: qty 3

## 2017-09-12 MED ORDER — IPRATROPIUM BROMIDE 0.02 % IN SOLN
0.5000 mg | Freq: Once | RESPIRATORY_TRACT | Status: AC
  Administered 2017-09-12: 0.5 mg via RESPIRATORY_TRACT
  Filled 2017-09-12: qty 2.5

## 2017-09-12 NOTE — ED Notes (Signed)
Bed: [id number] Expected date:  Expected time:  Means of arrival:  Comments: 82 m sob

## 2017-09-12 NOTE — ED Provider Notes (Signed)
[facility]-EMERGENCY DEPT Provider Note   CSN: [id number] Arrival date & time: 09/12/17  2222     History   Chief Complaint Chief Complaint  Patient presents with  . Shortness of Breath   Level 5 caveat due to dementia HPI David Duke is a 83 y.o. male.  The history is provided by the patient and the spouse. The history is limited by the condition of the patient.  Shortness of Breath  This is a new problem. The problem occurs frequently.The current episode started 6 to 12 hours ago. The problem has been gradually worsening. Associated symptoms include cough. Associated medical issues include COPD.  Patient with history of chronic kidney disease, history of COPD, history of dementia, history of previous stroke presents with cough and shortness of breath over the past 6-12 hours He was just discharged from hospital yesterday for COPD exacerbation, he went home feeling well but then this returned over the past 6-12 hours He also has other multiple complaints including mild headache, anxiety as he is supposed to be taking Valium Past Medical History:  Diagnosis Date  . AKI (acute kidney injury) (HCC)   . Anxiety   . Blind right eye   . BPH (benign prostatic hypertrophy) with urinary retention   . COPD (chronic obstructive pulmonary disease) (HCC)   . Dementia   . Depression   . High cholesterol   . Macular degeneration of both eyes   . Renal disorder   . Shortness of breath dyspnea   . Stroke [facility](HCC)    has residual slurred speech    Patient Active Problem List   Diagnosis Date Noted  . COPD exacerbation (HCC) 09/07/2017  . Acute respiratory failure with hypoxia (HCC) 09/07/2017  . Rhinitis, chronic 09/07/2017  . Malnutrition of moderate degree 09/07/2017  . Elevated troponin 03/27/2017  . Diplopia 03/27/2017  . Bilateral leg edema 03/27/2017  . Enterocolitis 09/30/2016  . HLD (hyperlipidemia) 09/30/2016  . Tobacco abuse 09/30/2016  . Nausea  vomiting and diarrhea 09/30/2016  . History of stroke in prior 3 months 01/22/2016  . Chronic tension-type headache, not intractable   . Acute frontal sinusitis   . 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 Gait disturbance, post-stroke   . Ataxia, late effect of cerebrovascular disease   . BPH (benign prostatic hyperplasia)   . Urinary retention   . Adjustment disorder with mixed anxiety and depressed mood   . Chronic obstructive pulmonary disease (HCC)   . Cephalalgia   . Acute kidney injury superimposed on chronic kidney disease (HCC)   . Macular degeneration   . Prediabetes   . Hypokalemia   . Absolute anemia   . Aphasia   . Acute encephalopathy 11/25/2015  . Hyponatremia 11/25/2015  . Acute kidney injury (HCC) 11/25/2015  . Depression   . Anxiety   . COPD (chronic obstructive pulmonary disease) (HCC)   . ICH (intracerebral hemorrhage) (HCC) 11/22/2015    Past Surgical History:  Procedure Laterality Date  . NO PAST SURGERIES         Home Medications    Prior to Admission medications   Medication Sig Start Date End Date Taking? Authorizing Provider  albuterol (ACCUNEB) 0.63 MG/3ML nebulizer solution Take 1 ampule by nebulization every 6 (six) hours as needed for wheezing.   Yes [provider]  albuterol (PROVENTIL HFA;VENTOLIN HFA) 108 (90 Base) MCG/ACT inhaler Inhale 1-2 puffs into the lungs every 6 (six) hours as needed for wheezing  or shortness of breath. 12/28/16  Yes Long, Arlyss Repress, MD  busPIRone (BUSPAR) 5 MG tablet Take 5 mg by mouth 2 (two) times daily.   Yes [provider]  diazepam (VALIUM) 5 MG tablet Take 1 tablet (5 mg total) by mouth every 8 (eight) hours as needed for anxiety. Can fill no sooner than October 24, 2016. Patient taking differently: Take 5 mg by mouth 4 (four) times daily.  10/20/16  Yes Saguier, Ramon Dredge, PA-C  finasteride (PROSCAR) 5 MG tablet Take 5 mg by mouth daily. 09/22/16  Yes [provider]  fluticasone (FLONASE) 50 MCG/ACT nasal spray Place 1 spray into both nostrils daily. Patient taking differently: Place 1 spray into both nostrils daily as needed for allergies or rhinitis.  12/11/15  Yes Love, [name], PA-C  guaiFENesin (MUCINEX) 600 MG 12 hr tablet Take 2 tablets (1,200 mg total) by mouth 2 (two) times daily. 09/11/17  Yes [name], MD  lansoprazole (PREVACID) 30 MG capsule Take 30 mg by mouth daily after breakfast.   Yes [provider]  Multiple Vitamin (MULTIVITAMIN WITH MINERALS) TABS tablet Take 1 tablet by mouth daily.   Yes [provider]    Family History Family History  Problem Relation Age of Onset  . Colon cancer Mother   . Cerebral aneurysm Father     Social History Social History   Tobacco Use  . Smoking status: Current Every Day Smoker    Packs/day: 0.25    Years: 60.00    Pack years: 15.00    Types: Cigarettes  . Smokeless tobacco: Never Used  Substance Use Topics  . Alcohol use: No  . Drug use: No     Allergies   Penicillins; Amoxicillin; and Sulfa antibiotics   Review of Systems Review of Systems  Unable to perform ROS: Dementia  Respiratory: Positive for cough and shortness of breath.      Physical Exam Updated Vital Signs BP (!) 156/72 (BP Location: Right Arm)   Pulse 98   Temp 98.8 F (37.1 C) (Oral)   Resp (!) 24   SpO2 94%   Physical Exam CONSTITUTIONAL: Elderly, frail anxious HEAD: Normocephalic/atraumatic EYES: EOMI/PERRL ENMT: Mucous membranes moist NECK: supple no meningeal signs SPINE/BACK:entire spine nontender CV: S1/S2 noted, no murmurs/rubs/gallops noted LUNGS: Mild tachypnea noted, scattered wheezing bilaterally ABDOMEN: soft, nontender GU:no cva tenderness NEURO: Pt is awake/alert, patient appears confused, moves all extremities EXTREMITIES: pulses normal/equal, full ROM SKIN: warm, color normal PSYCH: Anxious ED Treatments / Results  Labs (all labs  ordered are listed, but only abnormal results are displayed) Labs Reviewed - No data to display  EKG  EKG Interpretation  Date/Time:  Saturday September 12 2017 23:35:37 EST Ventricular Rate:  99 PR Interval:    QRS Duration: 107 QT Interval:  350 QTC Calculation: 450 R Axis:   -83 Text Interpretation:  Sinus tachycardia Atrial premature complex Consider right atrial enlargement Incomplete RBBB and LAFB Right ventricular hypertrophy Interpretation limited secondary to artifact No significant change since last tracing Confirmed by Zadie Rhine ([id number]) on 09/12/2017 11:40:03 PM       Radiology Dg Chest Portable 1 View  Result Date: 09/12/2017 CLINICAL DATA:  Per EMS , pr. From home with complaint of SOB, pt. Was admitted here for COPD and discharged to home yesterday. Pt. Received breathing treatment via EMS, still reported of wheezing upon arrival to ED. Denied pain. EXAM: PORTABLE CHEST 1 VIEW COMPARISON:  09/07/2017 FINDINGS: Heart size is normal. The lungs  are free of focal consolidations and pleural effusions. No pulmonary edema. IMPRESSION: No evidence for acute cardiopulmonary abnormality. Electronically Signed   By: Norva Pavlov M.D.   On: 09/12/2017 23:50    Procedures Procedures (including critical care time)  Medications Ordered in ED Medications  albuterol (ACCUNEB) nebulizer solution 0.63 mg (not administered)  busPIRone (BUSPAR) tablet 5 mg (not administered)  diazepam (VALIUM) tablet 5 mg (not administered)  albuterol (PROVENTIL) (2.5 MG/3ML) 0.083% nebulizer solution 5 mg (5 mg Nebulization Given 09/12/17 2336)  ipratropium (ATROVENT) nebulizer solution 0.5 mg (0.5 mg Nebulization Given 09/12/17 2336)  predniSONE (DELTASONE) tablet 60 mg (60 mg Oral Given 09/13/17 0011)  LORazepam (ATIVAN) tablet 1 mg (1 mg Oral Given 09/13/17 0011)     Initial Impression / Assessment and Plan / ED Course  I have reviewed the triage vital signs and the nursing notes.  Pertinent  imaging results that were available during my care of the patient were reviewed by me and considered in my medical decision making (see chart for details).     1:56 AM Patient in the ED for recurrent COPD exacerbation after just being released from hospital on 09/11/17 At this point patient appears improved, he is resting comfortably, wheezing is improved His chest x-ray is negative I had a long discussion with his wife.  She reports due to all of his chronic medical conditions he is becoming very difficult to manage at home, and he does become combative at times due to his dementia Patient has had 19 ER visits in 6 months Medically, he does not appear to need to be admitted However it does appear that he is exceeding the capacity to be taken care of at home After discussion with wife, the plan will be to monitor overnight and have him see case management and social work in the morning as he may need placement We will order home his home meds  Final Clinical Impressions(s) / ED Diagnoses   Final diagnoses:  COPD exacerbation [facility])    ED Discharge Orders    None       Zadie Rhine, MD 09/13/17 0159

## 2017-09-12 NOTE — ED Triage Notes (Signed)
Per EMS , pr. From home with complaint of SOB, pt. Was admitted here for COPD and discharged to home yesterday. Pt. Received breathing treatment via EMS, still reported of wheezing upon arrival to ED. Denied pain. Alert and oriented x, denied fever ,denied chest pain.

## 2017-09-13 MED ORDER — ALBUTEROL SULFATE (2.5 MG/3ML) 0.083% IN NEBU: 2.5000 mg | INHALATION_SOLUTION | Freq: Four times a day (QID) | RESPIRATORY_TRACT | Status: DC | PRN

## 2017-09-13 MED ORDER — DIAZEPAM 5 MG PO TABS: 5.0000 mg | ORAL_TABLET | Freq: Three times a day (TID) | ORAL | Status: DC | PRN

## 2017-09-13 MED ORDER — BUSPIRONE HCL 10 MG PO TABS
5.0000 mg | ORAL_TABLET | Freq: Two times a day (BID) | ORAL | Status: DC
  Administered 2017-09-13: 5 mg via ORAL
  Filled 2017-09-13: qty 1

## 2017-09-13 NOTE — Progress Notes (Signed)
CSW received a call from pt's RN stating pt's wife requested pt be placed into a SNF. CSW review chart and spoke to pt's wife who said she was concerned for pt due to progressing dementia.  CSW explained what insurance companies usually pay for and consider to be rehab-able injuries/conditions.    CSW explained what SNF/ALF/Memory Care facilities are for and provided pt's wife with a list of all three and explained how to take pt to the PCP and request a signed FL-2 and pick a SNF if the PCP thinks the pt qualifies, or a ALF to utilize respite care if needed.  Pt's wife was appreciative and thanked the CSW.  Please reconsult if future social work needs arise.  CSW signing off, as social work intervention is no longer needed.  David PeaJonathan F. Konstance Happel, LCSW, LCAS, CSI Clinical Social Worker Ph: [phone]

## 2017-09-13 NOTE — Care Management Note (Addendum)
Case Management Note  Patient Details  Name: David BarlowDavid R Staunton MRN: [id number] Date of Birth: 12/04/1934  Subjective/Objective:     Shortness of breath               Action/Plan: Discharge Planning: NCM spoke to pt and wife at bedside. CSW referral for assistance with placement. Wife reports pt has neb machine at home. [facility] to make aware [facility]. [facility].   PCP Darleene CleaverASENSO, PHILIP MD   SATURATION QUALIFICATIONS: (This note is used to comply with regulatory documentation for home oxygen)  Patient Saturations on Room Air at Rest = 97%  Patient Saturations on Room Air while Ambulating = 94-97%  Walked pt around unit and denies shortness of breath on exertion.    Expected Discharge Date:  09/13/2017             Expected Discharge Plan:  Home w Home Health Services  In-House Referral:  Clinical Social Work  Discharge planning Services  CM Consult  Post Acute Care Choice:  Home Health, Resumption of Svcs/PTA Provider Choice offered to:  Spouse  DME Arranged:  N/A DME Agency:  NA  HH Arranged:  RN, PT, Nurse's Aide, Social Work [facility]:  [facility]  Status of Service:  Completed, signed off  If discussed at [employer] Length of [employer] Meetings, dates discussed:    Additional Comments:  Elliot CousinShavis, Jaicion Laurie Ellen, RN 09/13/2017, 10:00 AM

## 2017-09-13 NOTE — ED Provider Notes (Signed)
Notified that wife would like to take pt home now.  He was waiting for case management regarding home health.  Pt does have a PCP.  They can contact the PCP this week to discuss further.   Linwood DibblesKnapp, Lyla Jasek, MD 09/13/17 [phone]

## 2017-09-13 NOTE — ED Notes (Signed)
Pt waiting for social worker.  Family getting upset.  Spoke with Child psychotherapistsocial worker.  On his way.  Notified family.

## 2017-09-13 NOTE — Discharge Instructions (Signed)
Follow up with your primary care doctor on Monday to discuss having assistance at home, home health

## 2017-09-18 ENCOUNTER — Emergency Department (HOSPITAL_COMMUNITY): Payer: Medicare Other

## 2017-09-18 ENCOUNTER — Encounter (HOSPITAL_COMMUNITY): Payer: Self-pay

## 2017-09-18 ENCOUNTER — Other Ambulatory Visit: Payer: Self-pay

## 2017-09-18 ENCOUNTER — Inpatient Hospital Stay (HOSPITAL_COMMUNITY)
Admission: EM | Admit: 2017-09-18 | Discharge: 2017-09-22 | DRG: 871 | Disposition: A | Discharge status: Home Health | Payer: Medicare Other | Attending: Family Medicine | Admitting: Family Medicine

## 2017-09-18 DIAGNOSIS — I69919 Unspecified symptoms and signs involving cognitive functions following unspecified cerebrovascular disease: Secondary | ICD-10-CM | POA: Diagnosis not present

## 2017-09-18 DIAGNOSIS — I69993 Ataxia following unspecified cerebrovascular disease: Secondary | ICD-10-CM

## 2017-09-18 DIAGNOSIS — H5461 Unqualified visual loss, right eye, normal vision left eye: Secondary | ICD-10-CM | POA: Diagnosis present

## 2017-09-18 DIAGNOSIS — R1310 Dysphagia, unspecified: Secondary | ICD-10-CM | POA: Diagnosis present

## 2017-09-18 DIAGNOSIS — K5900 Constipation, unspecified: Secondary | ICD-10-CM | POA: Diagnosis not present

## 2017-09-18 DIAGNOSIS — I69391 Dysphagia following cerebral infarction: Secondary | ICD-10-CM | POA: Diagnosis not present

## 2017-09-18 DIAGNOSIS — I69319 Unspecified symptoms and signs involving cognitive functions following cerebral infarction: Secondary | ICD-10-CM | POA: Diagnosis not present

## 2017-09-18 DIAGNOSIS — N183 Chronic kidney disease, stage 3 (moderate): Secondary | ICD-10-CM | POA: Diagnosis present

## 2017-09-18 DIAGNOSIS — N401 Enlarged prostate with lower urinary tract symptoms: Secondary | ICD-10-CM | POA: Diagnosis present

## 2017-09-18 DIAGNOSIS — Z79899 Other long term (current) drug therapy: Secondary | ICD-10-CM | POA: Diagnosis not present

## 2017-09-18 DIAGNOSIS — F0150 Vascular dementia without behavioral disturbance: Secondary | ICD-10-CM | POA: Diagnosis present

## 2017-09-18 DIAGNOSIS — F419 Anxiety disorder, unspecified: Secondary | ICD-10-CM | POA: Diagnosis present

## 2017-09-18 DIAGNOSIS — I69393 Ataxia following cerebral infarction: Secondary | ICD-10-CM

## 2017-09-18 DIAGNOSIS — E7800 Pure hypercholesterolemia, unspecified: Secondary | ICD-10-CM | POA: Diagnosis present

## 2017-09-18 DIAGNOSIS — J189 Pneumonia, unspecified organism: Secondary | ICD-10-CM | POA: Diagnosis present

## 2017-09-18 DIAGNOSIS — F17210 Nicotine dependence, cigarettes, uncomplicated: Secondary | ICD-10-CM | POA: Diagnosis present

## 2017-09-18 DIAGNOSIS — Y95 Nosocomial condition: Secondary | ICD-10-CM | POA: Diagnosis present

## 2017-09-18 DIAGNOSIS — J449 Chronic obstructive pulmonary disease, unspecified: Secondary | ICD-10-CM | POA: Diagnosis present

## 2017-09-18 DIAGNOSIS — Z882 Allergy status to sulfonamides status: Secondary | ICD-10-CM

## 2017-09-18 DIAGNOSIS — B3749 Other urogenital candidiasis: Secondary | ICD-10-CM | POA: Diagnosis not present

## 2017-09-18 DIAGNOSIS — H3530 Unspecified macular degeneration: Secondary | ICD-10-CM | POA: Diagnosis present

## 2017-09-18 DIAGNOSIS — Z880 Allergy status to penicillin: Secondary | ICD-10-CM | POA: Diagnosis not present

## 2017-09-18 DIAGNOSIS — J440 Chronic obstructive pulmonary disease with acute lower respiratory infection: Secondary | ICD-10-CM | POA: Diagnosis present

## 2017-09-18 DIAGNOSIS — R338 Other retention of urine: Secondary | ICD-10-CM | POA: Diagnosis present

## 2017-09-18 DIAGNOSIS — J9601 Acute respiratory failure with hypoxia: Secondary | ICD-10-CM | POA: Diagnosis present

## 2017-09-18 DIAGNOSIS — A419 Sepsis, unspecified organism: Secondary | ICD-10-CM | POA: Diagnosis present

## 2017-09-18 DIAGNOSIS — I4510 Unspecified right bundle-branch block: Secondary | ICD-10-CM | POA: Diagnosis present

## 2017-09-18 DIAGNOSIS — J1000 Influenza due to other identified influenza virus with unspecified type of pneumonia: Secondary | ICD-10-CM | POA: Diagnosis present

## 2017-09-18 LAB — URINALYSIS, ROUTINE W REFLEX MICROSCOPIC
Bilirubin Urine: NEGATIVE
Glucose, UA: NEGATIVE mg/dL
KETONES UR: 5 mg/dL — AB
Nitrite: NEGATIVE
Protein, ur: NEGATIVE mg/dL
Specific Gravity, Urine: 1.014 (ref 1.005–1.030)
pH: 6 (ref 5.0–8.0)

## 2017-09-18 LAB — CBC WITH DIFFERENTIAL/PLATELET
Basophils Absolute: 0 10*3/uL (ref 0.0–0.1)
Basophils Relative: 0 %
EOS PCT: 0 %
Eosinophils Absolute: 0 10*3/uL (ref 0.0–0.7)
HCT: 40.5 % (ref 39.0–52.0)
HEMOGLOBIN: 13.9 g/dL (ref 13.0–17.0)
LYMPHS ABS: 0.6 10*3/uL — AB (ref 0.7–4.0)
Lymphocytes Relative: 4 %
MCH: 31.2 pg (ref 26.0–34.0)
MCHC: 34.3 g/dL (ref 30.0–36.0)
MCV: 91 fL (ref 78.0–100.0)
MONOS PCT: 5 %
Monocytes Absolute: 0.9 10*3/uL (ref 0.1–1.0)
Neutro Abs: 15.3 10*3/uL — ABNORMAL HIGH (ref 1.7–7.7)
Neutrophils Relative %: 91 %
PLATELETS: 168 10*3/uL (ref 150–400)
RBC: 4.45 MIL/uL (ref 4.22–5.81)
RDW: 13.9 % (ref 11.5–15.5)
WBC: 16.8 10*3/uL — AB (ref 4.0–10.5)

## 2017-09-18 LAB — COMPREHENSIVE METABOLIC PANEL
ALT: 54 U/L (ref 17–63)
AST: 63 U/L — AB (ref 15–41)
Albumin: 2.9 g/dL — ABNORMAL LOW (ref 3.5–5.0)
Alkaline Phosphatase: 63 U/L (ref 38–126)
Anion gap: 9 (ref 5–15)
BUN: 14 mg/dL (ref 6–20)
CHLORIDE: 103 mmol/L (ref 101–111)
CO2: 24 mmol/L (ref 22–32)
Calcium: 8 mg/dL — ABNORMAL LOW (ref 8.9–10.3)
Creatinine, Ser: 1.42 mg/dL — ABNORMAL HIGH (ref 0.61–1.24)
GFR calc Af Amer: 52 mL/min — ABNORMAL LOW (ref >60)
GFR, EST NON AFRICAN AMERICAN: 45 mL/min — AB (ref >60)
Glucose, Bld: 98 mg/dL (ref 65–99)
Potassium: 4.1 mmol/L (ref 3.5–5.1)
SODIUM: 136 mmol/L (ref 135–145)
Total Bilirubin: 1.5 mg/dL — ABNORMAL HIGH (ref 0.3–1.2)
Total Protein: 6.8 g/dL (ref 6.5–8.1)

## 2017-09-18 LAB — I-STAT TROPONIN, ED: TROPONIN I, POC: 0.02 ng/mL (ref 0.00–0.08)

## 2017-09-18 LAB — BRAIN NATRIURETIC PEPTIDE: B Natriuretic Peptide: 56.4 pg/mL (ref 0.0–100.0)

## 2017-09-18 LAB — I-STAT CG4 LACTIC ACID, ED
LACTIC ACID, VENOUS: 1.28 mmol/L (ref 0.5–1.9)
Lactic Acid, Venous: 0.66 mmol/L (ref 0.5–1.9)

## 2017-09-18 LAB — MRSA PCR SCREENING: MRSA by PCR: NEGATIVE

## 2017-09-18 LAB — PROTIME-INR
INR: 0.94
Prothrombin Time: 12.5 seconds (ref 11.4–15.2)

## 2017-09-18 MED ORDER — BUSPIRONE HCL 5 MG PO TABS
5.0000 mg | ORAL_TABLET | Freq: Two times a day (BID) | ORAL | Status: DC
  Administered 2017-09-19 – 2017-09-22 (×7): 5 mg via ORAL
  Filled 2017-09-18 (×7): qty 1

## 2017-09-18 MED ORDER — SODIUM CHLORIDE 0.9% FLUSH: 3.0000 mL | INTRAVENOUS | Status: DC | PRN

## 2017-09-18 MED ORDER — GUAIFENESIN ER 600 MG PO TB12
1200.0000 mg | ORAL_TABLET | Freq: Two times a day (BID) | ORAL | Status: DC
  Administered 2017-09-19 – 2017-09-22 (×7): 1200 mg via ORAL
  Filled 2017-09-18 (×7): qty 2

## 2017-09-18 MED ORDER — SODIUM CHLORIDE 0.9% FLUSH
3.0000 mL | Freq: Two times a day (BID) | INTRAVENOUS | Status: DC
  Administered 2017-09-19 – 2017-09-22 (×7): 3 mL via INTRAVENOUS

## 2017-09-18 MED ORDER — ALBUTEROL SULFATE (2.5 MG/3ML) 0.083% IN NEBU
3.0000 mL | INHALATION_SOLUTION | Freq: Four times a day (QID) | RESPIRATORY_TRACT | Status: DC | PRN
  Administered 2017-09-18: 3 mL via RESPIRATORY_TRACT

## 2017-09-18 MED ORDER — ENOXAPARIN SODIUM 40 MG/0.4ML ~~LOC~~ SOLN
40.0000 mg | SUBCUTANEOUS | Status: DC
  Administered 2017-09-18 – 2017-09-21 (×4): 40 mg via SUBCUTANEOUS
  Filled 2017-09-18 (×4): qty 0.4

## 2017-09-18 MED ORDER — ADULT MULTIVITAMIN W/MINERALS CH
1.0000 | ORAL_TABLET | Freq: Every day | ORAL | Status: DC
  Administered 2017-09-19 – 2017-09-22 (×4): 1 via ORAL
  Filled 2017-09-18 (×4): qty 1

## 2017-09-18 MED ORDER — IPRATROPIUM-ALBUTEROL 0.5-2.5 (3) MG/3ML IN SOLN
3.0000 mL | Freq: Four times a day (QID) | RESPIRATORY_TRACT | Status: DC
  Administered 2017-09-19: 3 mL via RESPIRATORY_TRACT
  Filled 2017-09-18: qty 3

## 2017-09-18 MED ORDER — VANCOMYCIN HCL IN DEXTROSE 1-5 GM/200ML-% IV SOLN: 1000.0000 mg | INTRAVENOUS | Status: DC

## 2017-09-18 MED ORDER — DEXTROSE 5 % IV SOLN
2.0000 g | INTRAVENOUS | Status: DC
  Administered 2017-09-19: 2 g via INTRAVENOUS
  Filled 2017-09-18 (×2): qty 2

## 2017-09-18 MED ORDER — ACETAMINOPHEN 325 MG PO TABS
650.0000 mg | ORAL_TABLET | Freq: Once | ORAL | Status: AC
  Administered 2017-09-18: 650 mg via ORAL
  Filled 2017-09-18: qty 2

## 2017-09-18 MED ORDER — VANCOMYCIN HCL IN DEXTROSE 1-5 GM/200ML-% IV SOLN
1000.0000 mg | Freq: Once | INTRAVENOUS | Status: AC
  Administered 2017-09-18: 1000 mg via INTRAVENOUS
  Filled 2017-09-18: qty 200

## 2017-09-18 MED ORDER — ALBUTEROL SULFATE HFA 108 (90 BASE) MCG/ACT IN AERS
1.0000 | INHALATION_SPRAY | Freq: Four times a day (QID) | RESPIRATORY_TRACT | Status: DC | PRN
Start: 2017-09-18 — End: 2017-09-18

## 2017-09-18 MED ORDER — DEXTROSE 5 % IV SOLN
2.0000 g | INTRAVENOUS | Status: AC
  Administered 2017-09-18: 2 g via INTRAVENOUS
  Filled 2017-09-18: qty 2

## 2017-09-18 MED ORDER — DIAZEPAM 5 MG PO TABS
5.0000 mg | ORAL_TABLET | Freq: Three times a day (TID) | ORAL | Status: DC | PRN
Start: 2017-09-18 — End: 2017-09-22
  Administered 2017-09-19 – 2017-09-22 (×9): 5 mg via ORAL
  Filled 2017-09-18 (×10): qty 1

## 2017-09-18 MED ORDER — SODIUM CHLORIDE 0.9 % IV SOLN: 250.0000 mL | INTRAVENOUS | Status: DC | PRN

## 2017-09-18 MED ORDER — IPRATROPIUM-ALBUTEROL 0.5-2.5 (3) MG/3ML IN SOLN
3.0000 mL | Freq: Four times a day (QID) | RESPIRATORY_TRACT | Status: DC
  Filled 2017-09-18: qty 3

## 2017-09-18 MED ORDER — FINASTERIDE 5 MG PO TABS
5.0000 mg | ORAL_TABLET | Freq: Every day | ORAL | Status: DC
  Administered 2017-09-18 – 2017-09-22 (×5): 5 mg via ORAL
  Filled 2017-09-18 (×5): qty 1

## 2017-09-18 MED ORDER — LEVOFLOXACIN IN D5W 750 MG/150ML IV SOLN: 750.0000 mg | Freq: Once | INTRAVENOUS | Status: DC

## 2017-09-18 MED ORDER — SODIUM CHLORIDE 0.9 % IV BOLUS (SEPSIS)
1000.0000 mL | Freq: Once | INTRAVENOUS | Status: AC
  Administered 2017-09-18: 1000 mL via INTRAVENOUS

## 2017-09-18 NOTE — H&P (Signed)
History and Physical    David Duke:[id number] DOB: 05-Oct-1934 DOA: 09/18/2017  PCP: [name], MD  Patient coming from:  home  Chief Complaint: sob  HPI: David Duke is a 83 y.o. male with medical history significant of copd, stroke, dementia comes in for worsening sob for the last 2 days and wheeezing with a lot of nasal congestion.  Wife lives with him at home.  No nn/v/d.  Just had pna hospitalization 2 weeks ago.  No coughing or choking when eating.  Temp today over 103.  Wife says she had the flu last week.  Pt not on home oxygen and sats with ems were 83% on RA.  Pt found to have new pna, referred for admissions for sepsis with pna.  Review of Systems: As per HPI otherwise 10 point review of systems negative.   Past Medical History:  Diagnosis Date  . AKI (acute kidney injury) (HCC)   . Anxiety   . Blind right eye   . BPH (benign prostatic hypertrophy) with urinary retention   . COPD (chronic obstructive pulmonary disease) (HCC)   . Dementia   . Depression   . High cholesterol   . Macular degeneration of both eyes   . Renal disorder   . Shortness of breath dyspnea   . Stroke [facility])    has residual slurred speech    Past Surgical History:  Procedure Laterality Date  . NO PAST SURGERIES       reports that he has been smoking cigarettes.  He has a 15.00 pack-year smoking history. he has never used smokeless tobacco. He reports that he does not drink alcohol or use drugs.  Allergies  Allergen Reactions  . Penicillins Hives, Diarrhea and Nausea And Vomiting    Has patient had a PCN reaction causing immediate rash, facial/tongue/throat swelling, SOB or lightheadedness with hypotension: Yes Has patient had a PCN reaction causing severe rash involving mucus membranes or skin necrosis: No Has patient had a PCN reaction that required hospitalization: No Has patient had a PCN reaction occurring within the last 10 years: No If all of the above answers are  "NO", then may proceed with Cephalosporin use.   [name] Amoxicillin Hives, Diarrhea and Nausea And Vomiting  . Sulfa Antibiotics Nausea Only    Family History  Problem Relation Age of Onset  . Colon cancer Mother   . Cerebral aneurysm Father     Prior to Admission medications   Medication Sig Start Date End Date Taking? Authorizing Provider  albuterol (ACCUNEB) 0.63 MG/3ML nebulizer solution Take 1 ampule by nebulization every 6 (six) hours as needed for wheezing.   Yes [provider]  albuterol (PROVENTIL HFA;VENTOLIN HFA) 108 (90 Base) MCG/ACT inhaler Inhale 1-2 puffs into the lungs every 6 (six) hours as needed for wheezing or shortness of breath. 12/28/16  Yes Long, Arlyss Repress, MD  busPIRone (BUSPAR) 5 MG tablet Take 5 mg by mouth 2 (two) times daily.   Yes [provider]  diazepam (VALIUM) 5 MG tablet Take 1 tablet (5 mg total) by mouth every 8 (eight) hours as needed for anxiety. Can fill no sooner than October 24, 2016. Patient taking differently: Take 5 mg by mouth 4 (four) times daily.  10/20/16  Yes Saguier, Ramon Dredge, PA-C  finasteride (PROSCAR) 5 MG tablet Take 5 mg by mouth daily. 09/22/16  Yes [provider]  fluticasone (FLONASE) 50 MCG/ACT nasal spray Place 1 spray into both nostrils daily. Patient taking differently: Place  1 spray into both nostrils daily as needed for allergies or rhinitis.  12/11/15  Yes Love, Evlyn KannerPamela S, PA-C  guaiFENesin (MUCINEX) 600 MG 12 hr tablet Take 2 tablets (1,200 mg total) by mouth 2 (two) times daily. 09/11/17  Yes Maxie BarbBhandari, Dron Prasad, MD  lansoprazole (PREVACID) 30 MG capsule Take 30 mg by mouth daily after breakfast.   Yes [provider]  Multiple Vitamin (MULTIVITAMIN WITH MINERALS) TABS tablet Take 1 tablet by mouth daily.   Yes [provider]    Physical Exam: Vitals:   09/18/17 1348 09/18/17 1400 09/18/17 1552 09/18/17 1555  BP:  (!) 159/77 101/61   Pulse:  99 88   Resp:  (!) 28 (!) 28   Temp: (!)  103.7 F (39.8 C)   100.3 F (37.9 C)  TempSrc: Rectal   Oral  SpO2:  96% 94%   Weight: 61.7 kg (136 lb)     Height: 5\' 8"  (1.727 m)         Constitutional: NAD, calm, comfortable Vitals:   09/18/17 1348 09/18/17 1400 09/18/17 1552 09/18/17 1555  BP:  (!) 159/77 101/61   Pulse:  99 88   Resp:  (!) 28 (!) 28   Temp: (!) 103.7 F (39.8 C)   100.3 F (37.9 C)  TempSrc: Rectal   Oral  SpO2:  96% 94%   Weight: 61.7 kg (136 lb)     Height: 5\' 8"  (1.727 m)      Eyes: PERRL, lids and conjunctivae normal ENMT: Mucous membranes are moist. Posterior pharynx clear of any exudate or lesions.Normal dentition.  Neck: normal, supple, no masses, no thyromegaly Respiratory: clear to auscultation bilaterally, no wheezing, no crackles. Normal respiratory effort. No accessory muscle use.  Cardiovascular: Regular rate and rhythm, no murmurs / rubs / gallops. No extremity edema. 2+ pedal pulses. No carotid bruits.  Abdomen: no tenderness, no masses palpated. No hepatosplenomegaly. Bowel sounds positive.  Musculoskeletal: no clubbing / cyanosis. No joint deformity upper and lower extremities. Good ROM, no contractures. Normal muscle tone.  Skin: no rashes, lesions, ulcers. No induration Neurologic: CN 2-12 grossly intact. Sensation intact, DTR normal. Strength 5/5 in all 4.  Psychiatric:  Not agitated, pleasantly demented  Labs on Admission: I have personally reviewed following labs and imaging studies  CBC: Recent Labs  Lab 09/18/17 1355  WBC 16.8*  NEUTROABS 15.3*  HGB 13.9  HCT 40.5  MCV 91.0  PLT 168   Basic Metabolic Panel: Recent Labs  Lab 09/18/17 1355  NA 136  K 4.1  CL 103  CO2 24  GLUCOSE 98  BUN 14  CREATININE 1.42*  CALCIUM 8.0*   GFR: Estimated Creatinine Clearance: 36.2 mL/min (A) (by C-G formula based on SCr of 1.42 mg/dL (H)). Liver Function Tests: Recent Labs  Lab 09/18/17 1355  AST 63*  ALT 54  ALKPHOS 63  BILITOT 1.5*  PROT 6.8  ALBUMIN 2.9*   No  results for input(s): LIPASE, AMYLASE in the last 168 hours. No results for input(s): AMMONIA in the last 168 hours. Coagulation Profile: Recent Labs  Lab 09/18/17 1355  INR 0.94   Cardiac Enzymes: No results for input(s): CKTOTAL, CKMB, CKMBINDEX, TROPONINI in the last 168 hours. BNP (last 3 results) No results for input(s): PROBNP in the last 8760 hours. HbA1C: No results for input(s): HGBA1C in the last 72 hours. CBG: No results for input(s): GLUCAP in the last 168 hours. Lipid Profile: No results for input(s): CHOL, HDL, LDLCALC, TRIG, CHOLHDL, LDLDIRECT  in the last 72 hours. Thyroid Function Tests: No results for input(s): TSH, T4TOTAL, FREET4, T3FREE, THYROIDAB in the last 72 hours. Anemia Panel: No results for input(s): VITAMINB12, FOLATE, FERRITIN, TIBC, IRON, RETICCTPCT in the last 72 hours. Urine analysis:    Component Value Date/Time   COLORURINE YELLOW 08/26/2017 1719   APPEARANCEUR CLOUDY (A) 08/26/2017 1719   LABSPEC 1.010 08/26/2017 1719   PHURINE 7.0 08/26/2017 1719   GLUCOSEU NEGATIVE 08/26/2017 1719   HGBUR SMALL (A) 08/26/2017 1719   BILIRUBINUR NEGATIVE 08/26/2017 1719   KETONESUR NEGATIVE 08/26/2017 1719   PROTEINUR NEGATIVE 08/26/2017 1719   UROBILINOGEN 0.2 03/31/2015 1850   NITRITE POSITIVE (A) 08/26/2017 1719   LEUKOCYTESUR LARGE (A) 08/26/2017 1719   Sepsis Labs: !!!!!!!!!!!!!!!!!!!!!!!!!!!!!!!!!!!!!!!!!!!! @LABRCNTIP (procalcitonin:4,lacticidven:4) )No results found for this or any previous visit (from the past 240 hour(s)).   Radiological Exams on Admission: Dg Chest Port 1 View  Result Date: 09/18/2017 CLINICAL DATA:  Cough, weakness, and general malaise. EXAM: PORTABLE CHEST 1 VIEW COMPARISON:  Chest x-ray dated September 12, 2017. FINDINGS: The cardiomediastinal silhouette is normal in size. Normal pulmonary vascularity. New patchy opacity at the left lung base. No pleural effusion or pneumothorax. No acute osseous abnormality. IMPRESSION: New  patchy opacity at the left lung base, atelectasis versus infiltrate. Electronically Signed   By: Obie Dredge M.D.   On: 09/18/2017 14:17    Old chart reviewed Case discussed with edp  Assessment/Plan 83 yo male with sepsis and HCAP  Principal Problem:   HCAP (healthcare-associated pneumonia)- iv vanc and cefepime.  Blood and sputum cx.  Will ck mbss to r/o aspiration component.  Ck flu.  Lactic nml.  bp good.  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

## 2017-09-18 NOTE — ED Notes (Signed)
ED TO INPATIENT HANDOFF REPORT  Name/Age/Gender David Duke 83 y.o. male  Code Status Code Status History    Date Active Date Inactive Code Status Order ID Comments User Context   09/07/2017 08:39 09/11/2017 19:20 Full Code [id number]  Barton Dubois, MD Inpatient   03/27/2017 18:54 03/28/2017 20:48 Full Code [id number]  Shela Leff, MD Inpatient   09/30/2016 05:05 10/01/2016 17:36 Full Code [id number]  Ivor Costa, MD ED   06/10/2016 01:07 06/10/2016 18:57 Full Code [id number]  Varney Biles, MD ED   11/28/2015 16:30 12/11/2015 15:40 Full Code [id number]  Bary Leriche, PA-C Inpatient   11/28/2015 15:58 11/28/2015 16:30 Full Code [id number]  Bary Leriche, PA-C Inpatient   11/22/2015 18:11 11/28/2015 15:58 Full Code [id number]  Maisie Fus, MD ED      Home/SNF/Other Home  Chief Complaint SOB  Level of Care/Admitting Diagnosis ED Disposition    ED Disposition Condition [place]: [facility] [[id number]]  Level of Care: Med-Surg [16]  Diagnosis: HCAP (healthcare-associated pneumonia) [867544]  Admitting Physician: Phillips Grout [[id number]]  Attending Physician: Derrill Kay A [[id number]]  Estimated length of stay: 3 - 4 days  Certification:: I certify this patient will need inpatient services for at least 2 midnights  PT Class (Do Not Modify): Inpatient [101]  PT Acc Code (Do Not Modify): Private [1]       Medical History Past Medical History:  Diagnosis Date  . AKI (acute kidney injury) ([place])   . Anxiety   . Blind right eye   . BPH (benign prostatic hypertrophy) with urinary retention   . COPD (chronic obstructive pulmonary disease) ([place])   . Dementia   . Depression   . High cholesterol   . Macular degeneration of both eyes   . Renal disorder   . Shortness of breath dyspnea   . Stroke [facility])    has residual slurred speech    Allergies Allergies  Allergen Reactions  . Penicillins Hives, Diarrhea and Nausea And  Vomiting    Has patient had a PCN reaction causing immediate rash, facial/tongue/throat swelling, SOB or lightheadedness with hypotension: Yes Has patient had a PCN reaction causing severe rash involving mucus membranes or skin necrosis: No Has patient had a PCN reaction that required hospitalization: No Has patient had a PCN reaction occurring within the last 10 years: No If all of the above answers are "NO", then may proceed with Cephalosporin use.   [name] Amoxicillin Hives, Diarrhea and Nausea And Vomiting  . Sulfa Antibiotics Nausea Only    IV Location/Drains/Wounds Patient Lines/Drains/Airways Status   Active Line/Drains/Airways    Name:   Placement date:   Placement time:   Site:   Days:   Peripheral IV 09/18/17 Left;Posterior Forearm   09/18/17    1418    Forearm   less than 1   Peripheral IV 09/18/17 Right Antecubital   09/18/17    1419    Antecubital   less than 1   Urethral Catheter [id number] Coude 16 Fr.   08/26/17    1900    Coude   23          Labs/Imaging Results for orders placed or performed during the hospital encounter of 09/18/17 (from the past 48 hour(s))  Comprehensive metabolic panel     Status: Abnormal   Collection Time: 09/18/17  1:55 PM  Result Value Ref Range   Sodium 136 135 - 145 mmol/L   Potassium 4.1  3.5 - 5.1 mmol/L   Chloride 103 101 - 111 mmol/L   CO2 24 22 - 32 mmol/L   Glucose, Bld 98 65 - 99 mg/dL   BUN 14 6 - 20 mg/dL   Creatinine, Ser 1.42 (H) 0.61 - 1.24 mg/dL   Calcium 8.0 (L) 8.9 - 10.3 mg/dL   Total Protein 6.8 6.5 - 8.1 g/dL   Albumin 2.9 (L) 3.5 - 5.0 g/dL   AST 63 (H) 15 - 41 U/L   ALT 54 17 - 63 U/L   Alkaline Phosphatase 63 38 - 126 U/L   Total Bilirubin 1.5 (H) 0.3 - 1.2 mg/dL   GFR calc non Af Amer 45 (L) >60 mL/min   GFR calc Af Amer 52 (L) >60 mL/min    Comment: (NOTE) The eGFR has been calculated using the CKD EPI equation. This calculation has not been validated in all clinical situations. eGFR's persistently <60 mL/min  signify possible Chronic Kidney Disease.    Anion gap 9 5 - 15  CBC WITH DIFFERENTIAL     Status: Abnormal   Collection Time: 09/18/17  1:55 PM  Result Value Ref Range   WBC 16.8 (H) 4.0 - 10.5 K/uL   RBC 4.45 4.22 - 5.81 MIL/uL   Hemoglobin 13.9 13.0 - 17.0 g/dL   HCT 40.5 39.0 - 52.0 %   MCV 91.0 78.0 - 100.0 fL   MCH 31.2 26.0 - 34.0 pg   MCHC 34.3 30.0 - 36.0 g/dL   RDW 13.9 11.5 - 15.5 %   Platelets 168 150 - 400 K/uL   Neutrophils Relative % 91 %   Neutro Abs 15.3 (H) 1.7 - 7.7 K/uL   Lymphocytes Relative 4 %   Lymphs Abs 0.6 (L) 0.7 - 4.0 K/uL   Monocytes Relative 5 %   Monocytes Absolute 0.9 0.1 - 1.0 K/uL   Eosinophils Relative 0 %   Eosinophils Absolute 0.0 0.0 - 0.7 K/uL   Basophils Relative 0 %   Basophils Absolute 0.0 0.0 - 0.1 K/uL  Protime-INR     Status: None   Collection Time: 09/18/17  1:55 PM  Result Value Ref Range   Prothrombin Time 12.5 11.4 - 15.2 seconds   INR 0.94   I-stat troponin, ED (not at [facility], [facility])     Status: None   Collection Time: 09/18/17  2:05 PM  Result Value Ref Range   Troponin i, poc 0.02 0.00 - 0.08 ng/mL   Comment 3            Comment: Due to the release kinetics of cTnI, a negative result within the first hours of the onset of symptoms does not rule out myocardial infarction with certainty. If myocardial infarction is still suspected, repeat the test at appropriate intervals.   I-Stat CG4 Lactic Acid, ED  (not at  [facility])     Status: None   Collection Time: 09/18/17  2:06 PM  Result Value Ref Range   Lactic Acid, Venous 1.28 0.5 - 1.9 mmol/L   Dg Chest Port 1 View  Result Date: 09/18/2017 CLINICAL DATA:  Cough, weakness, and general malaise. EXAM: PORTABLE CHEST 1 VIEW COMPARISON:  Chest x-ray dated September 12, 2017. FINDINGS: The cardiomediastinal silhouette is normal in size. Normal pulmonary vascularity. New patchy opacity at the left lung base. No pleural effusion or pneumothorax. No acute osseous abnormality. IMPRESSION:  New patchy opacity at the left lung base, atelectasis versus infiltrate. Electronically Signed   By: Orville Govern.D.  On: 09/18/2017 14:17    Pending Labs Unresulted Labs (From admission, onward)   Start     Ordered   09/18/17 1506  Respiratory Panel by PCR  (Respiratory virus panel)  Once,   R     09/18/17 1505   09/18/17 1347  Blood Culture (routine x 2)  BLOOD CULTURE X 2,   STAT     09/18/17 1348   09/18/17 1347  Urinalysis, Routine w reflex microscopic  STAT,   STAT     09/18/17 1348   09/18/17 1347  Brain natriuretic peptide  STAT,   STAT     09/18/17 1348   09/18/17 1347  Urine culture  STAT,   STAT     09/18/17 1348      Vitals/Pain Today's Vitals   09/18/17 1348 09/18/17 1400 09/18/17 1552 09/18/17 1555  BP:  (!) 159/77 101/61   Pulse:  99 88   Resp:  (!) 28 (!) 28   Temp: (!) 103.7 F (39.8 C)   100.3 F (37.9 C)  TempSrc: Rectal   Oral  SpO2:  96% 94%   Weight: 136 lb (61.7 kg)     Height: 5' 8" (1.727 m)       Isolation Precautions Droplet precaution  Medications Medications  vancomycin (VANCOCIN) IVPB 1000 mg/200 mL premix (1,000 mg Intravenous New Bag/Given 09/18/17 1553)  acetaminophen (TYLENOL) tablet 650 mg (650 mg Oral Given 09/18/17 1441)  sodium chloride 0.9 % bolus 1,000 mL (1,000 mLs Intravenous New Bag/Given 09/18/17 1423)  ceFEPIme (MAXIPIME) 2 g in dextrose 5 % 50 mL IVPB (2 g Intravenous New Bag/Given 09/18/17 1439)    Mobility non-ambulatory

## 2017-09-18 NOTE — ED Triage Notes (Signed)
Per EMS, Pt from home with increasing shortness of breath and productive cough x1 week. Pt recently d/c from this facility for abx tx of bronchitis. Hx of COPD and dementia. No home O2 use, EMS states sats were 83% room air, pt placed on 4L/min [place].

## 2017-09-18 NOTE — ED Notes (Signed)
Unable to obtain blood cultures off IV line.

## 2017-09-18 NOTE — ED Notes (Signed)
Pt's leg bag has been emptied in order to obtain urine sample

## 2017-09-18 NOTE — Progress Notes (Signed)
Pharmacy Antibiotic Note  David Duke is a 83 y.o. male admitted on 09/18/2017 with pneumonia.  Pharmacy has been consulted for Vancomycin dosing; Cefepime per MD  Plan:  Vancomycin 1000 mg IV given in ED today, then 1000 mg IV q36 hr (est AUC 478 based on SCr 1.42)  Measure vancomycin AUC at steady state as indicated  Cefepime adjusted by pharmacist to 2 g IV q24 hr for renal function  Recommend checking MRSA PCR to r/o MRSA PNA   Height: 5\' 8"  (172.7 cm) Weight: 136 lb (61.7 kg) IBW/kg (Calculated) : 68.4  Temp (24hrs), Avg:100.9 F (38.3 C), Min:98.2 F (36.8 C), Max:103.7 F (39.8 C)  Recent Labs  Lab 09/18/17 1355 09/18/17 1406 09/18/17 1618  WBC 16.8*  --   --   CREATININE 1.42*  --   --   LATICACIDVEN  --  1.28 0.66    Estimated Creatinine Clearance: 36.2 mL/min (A) (by C-G formula based on SCr of 1.42 mg/dL (H)).    Allergies  Allergen Reactions  . Penicillins Hives, Diarrhea and Nausea And Vomiting    Has patient had a PCN reaction causing immediate rash, facial/tongue/throat swelling, SOB or lightheadedness with hypotension: Yes Has patient had a PCN reaction causing severe rash involving mucus membranes or skin necrosis: No Has patient had a PCN reaction that required hospitalization: No Has patient had a PCN reaction occurring within the last 10 years: No If all of the above answers are "NO", then may proceed with Cephalosporin use.   [name]. Amoxicillin Hives, Diarrhea and Nausea And Vomiting  . Sulfa Antibiotics Nausea Only    Antimicrobials this admission: 1/11 vanc >>  1/11 cefepime >>   Dose adjustments this admission: N/a  Microbiology results: 1/11 BCx: sent 1/11 UCx: ordered  1/11 Sputum: sent 1/11 respiratory panel: sent   Thank you for allowing pharmacy to be a part of this patient's care.  David Duke A 09/18/2017 5:22 PM

## 2017-09-18 NOTE — Care Management Note (Signed)
Case Management Note  Patient Details  Name: David BarlowDavid R Duke MRN: [id number] Date of Birth: 10/06/1934  Noted pt was [facility] and that [facility].  Contacted Karen with [facility] who stated pt is active with RN PT NA SW.  Clydie BraunKaren stated she would follow up to see where [facility]  Advised her pt was very likely going to be admitted.  CM will follow for needs.  Expected Discharge Date:   Unknown               Expected Discharge Plan:  Home w Home Health Services  Post Acute Care Choice:  Home Health Choice offered to:  Patient, Spouse  HH Arranged:  RN, PT, Nurse's Aide, Social Work [employer] [facility]  Status of Service:  In process, will continue to follow  Rica KoyanagiKritzer, Charlene Detter N, RN 09/18/2017, 2:00 PM

## 2017-09-18 NOTE — ED Provider Notes (Signed)
[facility]-EMERGENCY DEPT Provider Note  CSN: [id number] Arrival date & time: 09/18/17 1311  Chief Complaint(s) Shortness of Breath  HPI David Duke is a 83 y.o. male   Remainder of history, ROS, and physical exam limited due to patient's condition (dementia). Additional information was obtained from EMS and family.   Level V Caveat.   Patient presents with fever and shortness of breath and brought in by EMS.  They report that family stated patient had been having shortness of breath and productive cough for approximately 1 week.  Patient was recently admitted to hospital for COPD exacerbation.  Patient was noted to be hypoxic with sats 83% on room air by EMS and placed on 4 L nasal cannula.  To me wife reported that his symptoms have been ongoing for 2-3 days.  Patient is not currently on any oxygen at home.  She denies any additional symptoms including vomiting or diarrhea.  Patient has not been complaining of any belly pain.  HPI  Past Medical History Past Medical History:  Diagnosis Date  . AKI (acute kidney injury) ([facility])   . Anxiety   . Blind right eye   . BPH (benign prostatic hypertrophy) with urinary retention   . COPD (chronic obstructive pulmonary disease) ([facility])   . Dementia   . Depression   . High cholesterol   . Macular degeneration of both eyes   . Renal disorder   . Shortness of breath dyspnea   . Stroke [facility])    has residual slurred speech   Patient Active Problem List   Diagnosis Date Noted  . HCAP (healthcare-associated pneumonia) 09/18/2017  . COPD exacerbation ([facility]) 09/07/2017  . Acute respiratory failure with hypoxia ([facility]) 09/07/2017  . Rhinitis, chronic 09/07/2017  . Malnutrition of moderate degree 09/07/2017  . Elevated troponin 03/27/2017  . Diplopia 03/27/2017  . Bilateral leg edema 03/27/2017  . Enterocolitis 09/30/2016  . HLD (hyperlipidemia) 09/30/2016  . Tobacco abuse 09/30/2016  . Nausea vomiting and diarrhea  09/30/2016  . History of stroke in prior 3 months 01/22/2016  . Chronic tension-type headache, not intractable   . Acute frontal sinusitis   . 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 Gait disturbance, post-stroke   . Ataxia, late effect of cerebrovascular disease   . BPH (benign prostatic hyperplasia)   . Urinary retention   . Adjustment disorder with mixed anxiety and depressed mood   . Chronic obstructive pulmonary disease (HCC)   . Cephalalgia   . Acute kidney injury superimposed on chronic kidney disease (HCC)   . Macular degeneration   . Prediabetes   . Hypokalemia   . Absolute anemia   . Aphasia   . Acute encephalopathy 11/25/2015  . Hyponatremia 11/25/2015  . Acute kidney injury (HCC) 11/25/2015  . Depression   . Anxiety   . COPD (chronic obstructive pulmonary disease) ([facility])   . ICH (intracerebral hemorrhage) (HCC) 11/22/2015   Home Medication(s) Prior to Admission medications   Medication Sig Start Date End Date Taking? Authorizing Provider  albuterol (ACCUNEB) 0.63 MG/3ML nebulizer solution Take 1 ampule by nebulization every 6 (six) hours as needed for wheezing.   Yes [provider]  albuterol (PROVENTIL HFA;VENTOLIN HFA) 108 (90 Base) MCG/ACT inhaler Inhale 1-2 puffs into the lungs every 6 (six) hours as needed for wheezing or shortness of breath. 12/28/16  Yes Long, Arlyss Repress, MD  busPIRone (BUSPAR) 5 MG tablet Take 5 mg by mouth 2 (two) times daily.  Yes [provider]  diazepam (VALIUM) 5 MG tablet Take 1 tablet (5 mg total) by mouth every 8 (eight) hours as needed for anxiety. Can fill no sooner than October 24, 2016. Patient taking differently: Take 5 mg by mouth 4 (four) times daily.  10/20/16  Yes Saguier, Ramon Dredge, PA-C  finasteride (PROSCAR) 5 MG tablet Take 5 mg by mouth daily. 09/22/16  Yes [provider]  fluticasone (FLONASE) 50 MCG/ACT nasal spray Place 1 spray into both nostrils daily. Patient taking  differently: Place 1 spray into both nostrils daily as needed for allergies or rhinitis.  12/11/15  Yes Love, [name], PA-C  guaiFENesin (MUCINEX) 600 MG 12 hr tablet Take 2 tablets (1,200 mg total) by mouth 2 (two) times daily. 09/11/17  Yes [name], MD  lansoprazole (PREVACID) 30 MG capsule Take 30 mg by mouth daily after breakfast.   Yes [provider]  Multiple Vitamin (MULTIVITAMIN WITH MINERALS) TABS tablet Take 1 tablet by mouth daily.   Yes [provider]                                                                                                                                    Past Surgical History Past Surgical History:  Procedure Laterality Date  . NO PAST SURGERIES     Family History Family History  Problem Relation Age of Onset  . Colon cancer Mother   . Cerebral aneurysm Father     Social History Social History   Tobacco Use  . Smoking status: Current Every Day Smoker    Packs/day: 0.25    Years: 60.00    Pack years: 15.00    Types: Cigarettes  . Smokeless tobacco: Never Used  Substance Use Topics  . Alcohol use: No  . Drug use: No   Allergies Penicillins; Amoxicillin; and Sulfa antibiotics  Review of Systems Review of Systems  Unable to perform ROS: Dementia    Physical Exam Vital Signs  I have reviewed the triage vital signs BP (!) 154/65 (BP Location: Left Arm)   Pulse 97   Temp (!) 101.5 F (38.6 C) (Oral)   Resp (!) 31   SpO2 96%   Physical Exam  Constitutional: He appears well-developed and well-nourished. No distress.  HENT:  Head: Normocephalic and atraumatic.  Nose: Nose normal.  Eyes: Conjunctivae and EOM are normal. Pupils are equal, round, and reactive to light. Right eye exhibits no discharge. Left eye exhibits no discharge. No scleral icterus.  Neck: Normal range of motion. Neck supple.  Cardiovascular: Regular rhythm. Tachycardia present. Exam reveals no gallop and no friction rub.  No murmur  heard. Pulmonary/Chest: No stridor. Tachypnea noted. He has rales in the left lower field.  Abdominal: Soft. He exhibits no distension. There is no tenderness.  Musculoskeletal: He exhibits no edema or tenderness.  1+ BLE edema  Neurological: He is alert. He is disoriented.  demented  Skin: Skin is warm and dry. No rash noted. He is not diaphoretic. No erythema.  Psychiatric: He has a normal mood and affect.  Vitals reviewed.   ED Results and Treatments Labs (all labs ordered are listed, but only abnormal results are displayed) Labs Reviewed  COMPREHENSIVE METABOLIC PANEL - Abnormal; Notable for the following components:      Result Value   Creatinine, Ser 1.42 (*)    Calcium 8.0 (*)    Albumin 2.9 (*)    AST 63 (*)    Total Bilirubin 1.5 (*)    GFR calc non Af Amer 45 (*)    GFR calc Af Amer 52 (*)    All other components within normal limits  CBC WITH DIFFERENTIAL/PLATELET - Abnormal; Notable for the following components:   WBC 16.8 (*)    Neutro Abs 15.3 (*)    Lymphs Abs 0.6 (*)    All other components within normal limits  CULTURE, BLOOD (ROUTINE X 2)  CULTURE, BLOOD (ROUTINE X 2)  URINE CULTURE  RESPIRATORY PANEL BY PCR  PROTIME-INR  URINALYSIS, ROUTINE W REFLEX MICROSCOPIC  BRAIN NATRIURETIC PEPTIDE  I-STAT CG4 LACTIC ACID, ED  I-STAT TROPONIN, ED  I-STAT CG4 LACTIC ACID, ED                                                                                                                         EKG  EKG Interpretation  Date/Time:  Friday September 18 2017 13:32:03 EST Ventricular Rate:  97 PR Interval:    QRS Duration: 107 QT Interval:  315 QTC Calculation: 401 R Axis:   -85 Text Interpretation:  Sinus rhythm Atrial premature complexes Incomplete RBBB and LAFB Right ventricular hypertrophy No significant change since last tracing Confirmed by Drema Pry [phone]) on 09/18/2017 1:40:34 PM      Radiology Dg Chest Port 1 View  Result Date:  09/18/2017 CLINICAL DATA:  Cough, weakness, and general malaise. EXAM: PORTABLE CHEST 1 VIEW COMPARISON:  Chest x-ray dated September 12, 2017. FINDINGS: The cardiomediastinal silhouette is normal in size. Normal pulmonary vascularity. New patchy opacity at the left lung base. No pleural effusion or pneumothorax. No acute osseous abnormality. IMPRESSION: New patchy opacity at the left lung base, atelectasis versus infiltrate. Electronically Signed   By: Obie Dredge M.D.   On: 09/18/2017 14:17   Pertinent labs & imaging results that were available during my care of the patient were reviewed by me and considered in my medical decision making (see chart for details).  Medications Ordered in ED Medications  vancomycin (VANCOCIN) IVPB 1000 mg/200 mL premix (not administered)  acetaminophen (TYLENOL) tablet 650 mg (650 mg Oral Given 09/18/17 1441)  sodium chloride 0.9 % bolus 1,000 mL (1,000 mLs Intravenous New Bag/Given 09/18/17 1423)  ceFEPIme (MAXIPIME) 2 g in dextrose 5 % 50 mL IVPB (2 g Intravenous New Bag/Given 09/18/17 1439)  Procedures Procedures  (including critical care time)  Medical Decision Making / ED Course I have reviewed the nursing notes for this encounter and the patient's prior records (if available in EHR or on provided paperwork).    Patient is febrile and tachycardic with increased work of breathing and hypoxia requiring 3 L nasal cannula.  Code sepsis initiated.  Exam revealed left lower lobe rales.  No wheezing.  Bilateral lower extremity 1+ pitting edema.  Empiric antibiotics initiated for likely hospital-acquired pneumonia given his recent admission.  Patient provided with 1 L IV fluid bolus.  Lactic acid within normal limits.  Labs revealed leukocytosis with baseline renal function.  Respiratory viral panel ordered to assess for viral infection.   Chest x-ray confirmed left lower lobe pneumonia.  Admitted to medicine for further workup and management.  Final Clinical Impression(s) / ED Diagnoses Final diagnoses:  Sepsis, due to unspecified organism (HCC)  HCAP (healthcare-associated pneumonia)      This chart was dictated using voice recognition software.  Despite best efforts to proofread,  errors can occur which can change the documentation meaning.   Nira Connardama, Pedro Eduardo, MD 09/18/17 1544

## 2017-09-18 NOTE — Progress Notes (Signed)
PHARMACY NOTE:  ANTIMICROBIAL RENAL DOSAGE ADJUSTMENT  Current antimicrobial regimen includes a mismatch between antimicrobial dosage and estimated renal function.  As per policy approved by the Pharmacy & Therapeutics and Medical Executive Committees, the antimicrobial dosage will be adjusted accordingly.  Current antimicrobial dosage:  Cefepime ordered as 1gm q8  Indication: sepsis  Renal Function:   Estimated Creatinine Clearance: 36.2 mL/min (A) (by C-G formula based on SCr of 1.42 mg/dL (H)). []      On intermittent HD, scheduled: []      On CRRT    Antimicrobial dosage has been changed to:  Cefepime 2gm q24   Additional Comments:    Thank you for allowing pharmacy to be a part of this patient's care.  Otho BellowsGreen, Lucas Exline L  09/18/2017 5:13 PM

## 2017-09-18 NOTE — Progress Notes (Signed)
A consult was received from an ED physician for Cefepime and Vancomycin per pharmacy dosing.  The patient's profile has been reviewed for ht/wt/allergies/indication/available labs.   A one time order has been placed for Vancomycin 1g IV and Cefepime 2g IV.  Further antibiotics/pharmacy consults should be ordered by admitting physician if indicated.                       Thank you,  Lynann Beaverhristine Taner Rzepka PharmD, BCPS Pager [phone] 09/18/2017 2:11 PM

## 2017-09-18 NOTE — ED Notes (Signed)
Pt had drawn for blood culture= 1 yellow(peds)

## 2017-09-19 ENCOUNTER — Inpatient Hospital Stay (HOSPITAL_COMMUNITY): Payer: Medicare Other

## 2017-09-19 DIAGNOSIS — J449 Chronic obstructive pulmonary disease, unspecified: Secondary | ICD-10-CM

## 2017-09-19 DIAGNOSIS — J9601 Acute respiratory failure with hypoxia: Secondary | ICD-10-CM

## 2017-09-19 DIAGNOSIS — J189 Pneumonia, unspecified organism: Secondary | ICD-10-CM

## 2017-09-19 DIAGNOSIS — I69993 Ataxia following unspecified cerebrovascular disease: Secondary | ICD-10-CM

## 2017-09-19 DIAGNOSIS — A419 Sepsis, unspecified organism: Principal | ICD-10-CM

## 2017-09-19 LAB — RESPIRATORY PANEL BY PCR
Adenovirus: NOT DETECTED
BORDETELLA PERTUSSIS-RVPCR: NOT DETECTED
Chlamydophila pneumoniae: NOT DETECTED
Coronavirus 229E: NOT DETECTED
Coronavirus HKU1: NOT DETECTED
Coronavirus NL63: NOT DETECTED
Coronavirus OC43: NOT DETECTED
INFLUENZA A-RVPPCR: UNDETERMINED — AB
Influenza B: NOT DETECTED
METAPNEUMOVIRUS-RVPPCR: NOT DETECTED
Mycoplasma pneumoniae: NOT DETECTED
PARAINFLUENZA VIRUS 2-RVPPCR: NOT DETECTED
PARAINFLUENZA VIRUS 3-RVPPCR: NOT DETECTED
PARAINFLUENZA VIRUS 4-RVPPCR: NOT DETECTED
Parainfluenza Virus 1: NOT DETECTED
RHINOVIRUS / ENTEROVIRUS - RVPPCR: NOT DETECTED
Respiratory Syncytial Virus: NOT DETECTED

## 2017-09-19 LAB — BASIC METABOLIC PANEL
ANION GAP: 7 (ref 5–15)
BUN: 15 mg/dL (ref 6–20)
CALCIUM: 7.5 mg/dL — AB (ref 8.9–10.3)
CO2: 22 mmol/L (ref 22–32)
Chloride: 105 mmol/L (ref 101–111)
Creatinine, Ser: 1.32 mg/dL — ABNORMAL HIGH (ref 0.61–1.24)
GFR calc Af Amer: 57 mL/min — ABNORMAL LOW (ref >60)
GFR calc non Af Amer: 49 mL/min — ABNORMAL LOW (ref >60)
GLUCOSE: 92 mg/dL (ref 65–99)
Potassium: 3.5 mmol/L (ref 3.5–5.1)
Sodium: 134 mmol/L — ABNORMAL LOW (ref 135–145)

## 2017-09-19 LAB — CBC WITH DIFFERENTIAL/PLATELET
BASOS PCT: 0 %
Basophils Absolute: 0 10*3/uL (ref 0.0–0.1)
EOS PCT: 1 %
Eosinophils Absolute: 0.1 10*3/uL (ref 0.0–0.7)
HEMATOCRIT: 38.4 % — AB (ref 39.0–52.0)
Hemoglobin: 12.7 g/dL — ABNORMAL LOW (ref 13.0–17.0)
LYMPHS PCT: 16 %
Lymphs Abs: 1.7 10*3/uL (ref 0.7–4.0)
MCH: 30.3 pg (ref 26.0–34.0)
MCHC: 33.1 g/dL (ref 30.0–36.0)
MCV: 91.6 fL (ref 78.0–100.0)
MONO ABS: 0.6 10*3/uL (ref 0.1–1.0)
MONOS PCT: 6 %
NEUTROS ABS: 7.9 10*3/uL — AB (ref 1.7–7.7)
Neutrophils Relative %: 77 %
PLATELETS: 131 10*3/uL — AB (ref 150–400)
RBC: 4.19 MIL/uL — ABNORMAL LOW (ref 4.22–5.81)
RDW: 13.9 % (ref 11.5–15.5)
WBC: 10.3 10*3/uL (ref 4.0–10.5)

## 2017-09-19 LAB — STREP PNEUMONIAE URINARY ANTIGEN: STREP PNEUMO URINARY ANTIGEN: NEGATIVE

## 2017-09-19 MED ORDER — IPRATROPIUM-ALBUTEROL 0.5-2.5 (3) MG/3ML IN SOLN
3.0000 mL | Freq: Three times a day (TID) | RESPIRATORY_TRACT | Status: DC
  Administered 2017-09-19 – 2017-09-20 (×3): 3 mL via RESPIRATORY_TRACT
  Filled 2017-09-19 (×3): qty 3

## 2017-09-19 MED ORDER — OSELTAMIVIR PHOSPHATE 30 MG PO CAPS
30.0000 mg | ORAL_CAPSULE | Freq: Two times a day (BID) | ORAL | Status: DC
  Administered 2017-09-19 – 2017-09-22 (×7): 30 mg via ORAL
  Filled 2017-09-19 (×7): qty 1

## 2017-09-19 NOTE — Progress Notes (Signed)
Objective Swallowing Evaluation: Type of Study: MBS-Modified Barium Swallow Study   Patient Details  Name: David Duke MRN: [id number] Date of Birth: 1935-05-21  Today's Date: 09/19/2017 Time: SLP Start Time (ACUTE ONLY): 1426 -SLP Stop Time (ACUTE ONLY): 1445  SLP Time Calculation (min) (ACUTE ONLY): 19 min   Past Medical History:  Past Medical History:  Diagnosis Date  . AKI (acute kidney injury) (HCC)   . Anxiety   . Blind right eye   . BPH (benign prostatic hypertrophy) with urinary retention   . COPD (chronic obstructive pulmonary disease) (HCC)   . Dementia   . Depression   . High cholesterol   . Macular degeneration of both eyes   . Renal disorder   . Shortness of breath dyspnea   . Stroke [facility])    has residual slurred speech   Past Surgical History:  Past Surgical History:  Procedure Laterality Date  . NO PAST SURGERIES     HPI: GOLDEN GILREATH a 83 y.o.malewith medical history significant ofcopd, stroke, dementia comes in for worsening sob for the last 2 days and wheeezing with a lot of nasal congestion. Wife lives with him at home. No nn/v/d. Just had pna hospitalization 2 weeks ago. No coughing or choking when eating. Pt not on home oxygen and sats with ems were 83% on RA. Pt found to have new pna, referred for admissions for sepsis with pna. MBS ordered to r/o aspiration component.    No Data Recorded   Assessment / Plan / Recommendation  CHL IP CLINICAL IMPRESSIONS 09/19/2017  Clinical Impression MBS complete. Patient presents with a primary esophageal dysphagia characterized by presence of a CP bar resulting in consistent backflow of tail of bolus from C6 into pyriform sinuses post swallow.  Trace penetration of thin liquids noted incidentally with large bolus of thin liquid via straw, clearing quickly with subsequent swallows and cued throat clear, prevented with small single cup sips of liquid. No aspiration observed although cannot r/o  episodic post swallow aspiration given esophageal findings. Management at this time is use of dietary adjustments and compensatory strategies to decrease risk of aspiration. Initial education complete however patient and spouse will need f/u education prior to discharge.   SLP Visit Diagnosis Dysphagia, pharyngoesophageal phase (R13.14)  Attention and concentration deficit following --  Frontal lobe and executive function deficit following --  Impact on safety and function Mild aspiration risk;Moderate aspiration risk      CHL IP TREATMENT RECOMMENDATION 09/19/2017  Treatment Recommendations Therapy as outlined in treatment plan below     Prognosis 09/19/2017  Prognosis for Safe Diet Advancement --  Barriers to Reach Goals Cognitive deficits  Barriers/Prognosis Comment --    CHL IP DIET RECOMMENDATION 09/19/2017  SLP Diet Recommendations Dysphagia 3 (Mech soft) solids;Thin liquid  Liquid Administration via Cup;No straw  Medication Administration Crushed with puree  Compensations Slow rate;Small sips/bites;Follow solids with liquid  Postural Changes Remain semi-upright after after feeds/meals (Comment);Seated upright at 90 degrees      CHL IP OTHER RECOMMENDATIONS 09/19/2017  Recommended Consults --  Oral Care Recommendations Oral care BID  Other Recommendations --      CHL IP FOLLOW UP RECOMMENDATIONS 09/19/2017  Follow up Recommendations Home health SLP      CHL IP FREQUENCY AND DURATION 09/19/2017  Speech Therapy Frequency (ACUTE ONLY) min 2x/week  Treatment Duration 2 weeks           CHL IP ORAL PHASE 09/19/2017  Oral Phase WFL  Oral -  Pudding Teaspoon --  Oral - Pudding Cup --  Oral - Honey Teaspoon --  Oral - Honey Cup --  Oral - Nectar Teaspoon --  Oral - Nectar Cup --  Oral - Nectar Straw --  Oral - Thin Teaspoon --  Oral - Thin Cup --  Oral - Thin Straw --  Oral - Puree --  Oral - Mech Soft --  Oral - Regular --  Oral - Multi-Consistency --  Oral - Pill --   Oral Phase - Comment --    CHL IP PHARYNGEAL PHASE 09/19/2017  Pharyngeal Phase WFL  Pharyngeal- Pudding Teaspoon --  Pharyngeal --  Pharyngeal- Pudding Cup --  Pharyngeal --  Pharyngeal- Honey Teaspoon --  Pharyngeal --  Pharyngeal- Honey Cup --  Pharyngeal --  Pharyngeal- Nectar Teaspoon --  Pharyngeal --  Pharyngeal- Nectar Cup --  Pharyngeal --  Pharyngeal- Nectar Straw --  Pharyngeal --  Pharyngeal- Thin Teaspoon --  Pharyngeal --  Pharyngeal- Thin Cup --  Pharyngeal --  Pharyngeal- Thin Straw --  Pharyngeal --  Pharyngeal- Puree --  Pharyngeal --  Pharyngeal- Mechanical Soft --  Pharyngeal --  Pharyngeal- Regular --  Pharyngeal --  Pharyngeal- Multi-consistency --  Pharyngeal --  Pharyngeal- Pill --  Pharyngeal --  Pharyngeal Comment --     CHL IP CERVICAL ESOPHAGEAL PHASE 09/19/2017  Cervical Esophageal Phase Impaired  Pudding Teaspoon --  Pudding Cup --  Honey Teaspoon --  Honey Cup --  Nectar Teaspoon --  Nectar Cup --  Nectar Straw --  Thin Teaspoon --  Thin Cup --  Thin Straw --  Puree --  Mechanical Soft --  Regular --  Multi-consistency --  Pill --  Cervical Esophageal Comment see impression statement    Ferdinand LangoLeah Taleya Whitcher MA, CCC-SLP [phone]  Jerame Hedding Meryl 09/19/2017, 2:54 PM

## 2017-09-19 NOTE — Progress Notes (Signed)
PROGRESS NOTE  David Duke  ZOX:[id number] DOB: [birth year] DOA: 09/18/2017 PCP: [name], MD   Brief Narrative: David Duke is an 83 y.o. male with a history of tobacco [place], COPD, hemorrhagic CVA and dementia who presented to the ED 1/11 for progressively worsening productive cough, nasal congestion, wheezing and dyspnea. His wife is his primary caretaker since his recent discharge (admitted 12/31 to 1/4 treated for pneumonia) and she had flulike symptoms preceding these findings for the patient. EMS was called, found the patient to have SpO2 83% and brought him to the ED where he was febrile to 103.49F, tachypneic to 28/min, and hypoxic requiring 4L by [place]. CXR showed a new LLL patchy opacity, so antibiotics were given and the patient admitted. Viral panel returned an equivocal result for Influenza A, so tamiflu was started.   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 - Continue supplemental oxygen as needed. Will wean as tolerated once clinically improving.   Sepsis due to LLL pneumonia and influenza: - Received IVF's, appears euvolemic. Fever curve improving.  - DC vanc w/neg MRSA PCR - Continue cefepime - With recent flu exposure, equivocal RVP, and severity of illness started tamiflu x5 days 1/12. Empiric droplet precautions.  - Agree with MBSS to evaluate for aspiration with hx CVA and LLL infiltrate.   COPD: No significant wheezing on exam since admission, so holding systemic steroids.  - Continue inhaled bronchodilators and antibiotics as above.   Tobacco use:  - Cessation counseling provided.    Cerebrovascular disease, s/p hemorrhagic CVA, with residual ataxia and vascular dementia:  - Mentating at baseline - Delirium precautions  BPH with chronic urinary retention and chronic indwelling foley catheter:  - Continue finasteride, foley  CKD stage III: Based on current CrCl between 30-35.  - Renally dose medications, low dose tamiflu.  - Avoid nephrotoxins.    DVT prophylaxis: SCDs Code Status: Full Family Communication: Wife at bedside Disposition Plan: Anticipate DC home when improving.  Consultants: None Procedures: Foley PTA Antimicrobials:  Vancomycin 1/11 - 1/12  Cefepime 1/11 >>    Subjective: Frustrated with recent run of illnesses, also finding it difficult to retain information being provided him. Breathing is stable from admission, still coughing with yellow sputum.   Objective: Vitals:   09/19/17 0522 09/19/17 0800 09/19/17 0816 09/19/17 1324  BP: 99/60  (!) 108/57   Pulse: 74  74   Resp: 16  18   Temp: 98.1 F (36.7 C)  98.3 F (36.8 C)   TempSrc: Oral  Oral   SpO2: 98% 96% 96% 98%  Weight:      Height:        Intake/Output Summary (Last 24 hours) at 09/19/2017 1445 Last data filed at 09/19/2017 1350 Gross per 24 hour  Intake 250 ml  Output 900 ml  Net -650 ml   Filed Weights   09/18/17 1348  Weight: 61.7 kg (136 lb)    Gen: Elderly male in no distress Pulm: Non-labored on 2L at rest, diminished throughout without crackles or wheezes.  CV: Regular rate and rhythm. No murmur, rub, or gallop. No JVD, no pedal edema. GI: Abdomen soft, non-tender, non-distended, with normoactive bowel  sounds. No organomegaly or masses felt. GU: Foley catheter draining yellow urine. Ext: Warm, no deformities Skin: No rashes, lesions no ulcers Neuro: Alert and oriented to place and situation. No focal neurological deficits. Psych: Judgement and insight appear limited due to limited short term recall, diminished cognitive ability. Mood & affect appropriate.    Data Reviewed: I have personally reviewed following labs and imaging studies  CBC: Recent Labs  Lab 09/18/17 1355 09/19/17 0535  WBC 16.8* 10.3  NEUTROABS 15.3* 7.9*  HGB 13.9 12.7*  HCT 40.5 38.4*  MCV 91.0 91.6  PLT 168 131*   Basic Metabolic Panel: Recent Labs  Lab 09/18/17 1355 09/19/17 0535  NA 136 134*  K 4.1 3.5  CL 103 105  CO2 24 22  GLUCOSE 98 92  BUN 14 15  CREATININE 1.42* 1.32*  CALCIUM 8.0* 7.5*   GFR: Estimated Creatinine Clearance: 39 mL/min (A) (by C-G formula based on SCr of 1.32 mg/dL (H)). Liver Function Tests: Recent Labs  Lab 09/18/17 1355  AST 63*  ALT 54  ALKPHOS 63  BILITOT 1.5*  PROT 6.8  ALBUMIN 2.9*   No results for input(s): LIPASE, AMYLASE in the last 168 hours. No results for input(s): AMMONIA in the last 168 hours. Coagulation Profile: Recent Labs  Lab 09/18/17 1355  INR 0.94   Cardiac Enzymes: No results for input(s): CKTOTAL, CKMB, CKMBINDEX, TROPONINI in the last 168 hours. BNP (last 3 results) No results for input(s): PROBNP in the last 8760 hours. HbA1C: No results for input(s): HGBA1C in the last 72 hours. CBG: No results for input(s): GLUCAP in the last 168 hours. Lipid Profile: No results for input(s): CHOL, HDL, LDLCALC, TRIG, CHOLHDL, LDLDIRECT in the last 72 hours. Thyroid Function Tests: No results for input(s): TSH, T4TOTAL, FREET4, T3FREE, THYROIDAB in the last 72 hours. Anemia Panel: No results for input(s): VITAMINB12, FOLATE, FERRITIN, TIBC, IRON, RETICCTPCT in the last 72 hours. Urine analysis:    Component Value Date/Time   COLORURINE YELLOW 09/18/2017 1347   APPEARANCEUR CLEAR 09/18/2017 1347   LABSPEC 1.014 09/18/2017 1347   PHURINE 6.0 09/18/2017 1347   GLUCOSEU NEGATIVE 09/18/2017 1347   HGBUR SMALL (A) 09/18/2017 1347   BILIRUBINUR NEGATIVE 09/18/2017 1347   KETONESUR 5 (A) 09/18/2017 1347   PROTEINUR NEGATIVE 09/18/2017 1347   UROBILINOGEN 0.2 03/31/2015 1850   NITRITE NEGATIVE  09/18/2017 1347   LEUKOCYTESUR TRACE (A) 09/18/2017 1347   Recent Results (from the past 240 hour(s))  Blood Culture (routine x 2)     Status: None (Preliminary result)   Collection Time: 09/18/17  2:34 PM  Result Value Ref Range Status   Specimen Description BLOOD RIGHT HAND  Final   Special Requests IN PEDIATRIC BOTTLE Blood Culture adequate volume  Final   Culture   Final    NO GROWTH < 24 HOURS Performed at [facility], [address], [place], [place] [address]    Report Status PENDING  Incomplete  Blood Culture (routine x 2)     Status: None (Preliminary result)   Collection Time: 09/18/17  4:12 PM  Result Value Ref Range Status   Specimen Description BLOOD LEFT HAND  Final   Special Requests IN PEDIATRIC BOTTLE Blood Culture adequate volume  Final   Culture   Final    NO GROWTH < 24 HOURS Performed at [facility], [address] [place], [place] [address]    Report Status PENDING  Incomplete  Respiratory Panel by PCR  Status: Abnormal   Collection Time: 09/18/17  5:18 PM  Result Value Ref Range Status   Adenovirus NOT DETECTED NOT DETECTED Final   Coronavirus 229E NOT DETECTED NOT DETECTED Final   Coronavirus HKU1 NOT DETECTED NOT DETECTED Final   Coronavirus NL63 NOT DETECTED NOT DETECTED Final   Coronavirus OC43 NOT DETECTED NOT DETECTED Final   Metapneumovirus NOT DETECTED NOT DETECTED Final   Rhinovirus / Enterovirus NOT DETECTED NOT DETECTED Final   Influenza A EQUIVOCAL (A) NOT DETECTED Final   Influenza B NOT DETECTED NOT DETECTED Final   Parainfluenza Virus 1 NOT DETECTED NOT DETECTED Final   Parainfluenza Virus 2 NOT DETECTED NOT DETECTED Final   Parainfluenza Virus 3 NOT DETECTED NOT DETECTED Final   Parainfluenza Virus 4 NOT DETECTED NOT DETECTED Final   Respiratory Syncytial Virus NOT DETECTED NOT DETECTED Final   Bordetella pertussis NOT DETECTED NOT DETECTED Final   Chlamydophila pneumoniae NOT DETECTED NOT DETECTED Final   Mycoplasma  pneumoniae NOT DETECTED NOT DETECTED Final    Comment: Performed at [facility], [address] [place], [name] [address]  MRSA PCR Screening     Status: None   Collection Time: 09/18/17  5:37 PM  Result Value Ref Range Status   MRSA by PCR NEGATIVE NEGATIVE Final    Comment:        The GeneXpert MRSA Assay (FDA approved for NASAL specimens only), is one component of a comprehensive MRSA colonization surveillance program. It is not intended to diagnose MRSA infection nor to guide or monitor treatment for MRSA infections.       Radiology Studies: Dg Chest Port 1 View  Result Date: 09/18/2017 CLINICAL DATA:  Cough, weakness, and general malaise. EXAM: PORTABLE CHEST 1 VIEW COMPARISON:  Chest x-ray dated September 12, 2017. FINDINGS: The cardiomediastinal silhouette is normal in size. Normal pulmonary vascularity. New patchy opacity at the left lung base. No pleural effusion or pneumothorax. No acute osseous abnormality. IMPRESSION: New patchy opacity at the left lung base, atelectasis versus infiltrate. Electronically Signed   By: Obie DredgeWilliam T Derry M.D.   On: 09/18/2017 14:17    Scheduled Meds: . busPIRone  5 mg Oral BID  . enoxaparin (LOVENOX) injection  40 mg Subcutaneous Q24H  . finasteride  5 mg Oral Daily  . guaiFENesin  1,200 mg Oral BID  . ipratropium-albuterol  3 mL Nebulization TID  . multivitamin with minerals  1 tablet Oral Daily  . oseltamivir  30 mg Oral BID  . sodium chloride flush  3 mL Intravenous Q12H   Continuous Infusions: . sodium chloride    . ceFEPime (MAXIPIME) IV       LOS: 1 day   Time spent: 25 minutes.  Hazeline Junkeryan Laverle Pillard, MD [facility] Pager [phone]  If 7PM-7AM, please contact night-coverage [email or web address] Password [email or web address] 09/19/2017, 2:45 PM

## 2017-09-20 LAB — CBC
HEMATOCRIT: 36.8 % — AB (ref 39.0–52.0)
HEMOGLOBIN: 12.1 g/dL — AB (ref 13.0–17.0)
MCH: 30 pg (ref 26.0–34.0)
MCHC: 32.9 g/dL (ref 30.0–36.0)
MCV: 91.3 fL (ref 78.0–100.0)
Platelets: 174 10*3/uL (ref 150–400)
RBC: 4.03 MIL/uL — AB (ref 4.22–5.81)
RDW: 13.8 % (ref 11.5–15.5)
WBC: 11.1 10*3/uL — ABNORMAL HIGH (ref 4.0–10.5)

## 2017-09-20 LAB — URINE CULTURE

## 2017-09-20 LAB — BASIC METABOLIC PANEL
Anion gap: 6 (ref 5–15)
BUN: 12 mg/dL (ref 6–20)
CALCIUM: 8 mg/dL — AB (ref 8.9–10.3)
CO2: 23 mmol/L (ref 22–32)
Chloride: 109 mmol/L (ref 101–111)
Creatinine, Ser: 1.27 mg/dL — ABNORMAL HIGH (ref 0.61–1.24)
GFR, EST AFRICAN AMERICAN: 60 mL/min — AB (ref >60)
GFR, EST NON AFRICAN AMERICAN: 52 mL/min — AB (ref >60)
Glucose, Bld: 94 mg/dL (ref 65–99)
POTASSIUM: 3.7 mmol/L (ref 3.5–5.1)
Sodium: 138 mmol/L (ref 135–145)

## 2017-09-20 MED ORDER — LEVOFLOXACIN IN D5W 500 MG/100ML IV SOLN
500.0000 mg | INTRAVENOUS | Status: DC
  Administered 2017-09-20 – 2017-09-21 (×2): 500 mg via INTRAVENOUS
  Filled 2017-09-20 (×2): qty 100

## 2017-09-20 MED ORDER — ACETAMINOPHEN 325 MG PO TABS
650.0000 mg | ORAL_TABLET | ORAL | Status: DC | PRN
  Administered 2017-09-20 – 2017-09-21 (×2): 650 mg via ORAL
  Filled 2017-09-20 (×2): qty 2

## 2017-09-20 MED ORDER — IPRATROPIUM-ALBUTEROL 0.5-2.5 (3) MG/3ML IN SOLN
3.0000 mL | Freq: Two times a day (BID) | RESPIRATORY_TRACT | Status: DC
  Administered 2017-09-20 – 2017-09-22 (×4): 3 mL via RESPIRATORY_TRACT
  Filled 2017-09-20 (×4): qty 3

## 2017-09-20 NOTE — Progress Notes (Signed)
PROGRESS NOTE  David Duke  ZOX:[id number] DOB: July 29, 1935 DOA: 09/18/2017 PCP: [name], MD   Brief Narrative: David Duke is an 83 y.o. male with a history of tobacco [place], COPD, hemorrhagic CVA and dementia who presented to the ED 1/11 for progressively worsening productive cough, nasal congestion, wheezing and dyspnea. His wife is his primary caretaker since his recent discharge (admitted 12/31 to 1/4 treated for pneumonia) and she had flulike symptoms preceding these findings for the patient. EMS was called, found the patient to have SpO2 83% and brought him to the ED where he was febrile to 103.48F, tachypneic to 28/min, and hypoxic requiring 4L by [place]. CXR showed a new LLL patchy opacity, so antibiotics were given and the patient admitted. Viral panel returned an equivocal result for Influenza A, so tamiflu was started.   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 - Wean O2 as tolerated.  Sepsis due to LLL pneumonia and influenza: - Received IVF's, appears euvolemic. Fever curve improving.  - Convert cefepime to levaquin, would add anaerobic coverage if not improving given the finding of dysphagia as below. He appears to be improving on current abx and tamiflu alone for now.  - With recent flu exposure, equivocal RVP, and severity of illness started tamiflu x5 days 1/12. Empiric droplet precautions.   COPD: No significant wheezing on exam since admission, so holding systemic steroids.  - Continue inhaled bronchodilators and antibiotics as above.   Tobacco use:  - Cessation counseling provided. No nicotine patch  needed.  Dysphagia: Due to CVA.  - Dysphagia 3 diet, moderate aspiration risk per SLP.   Cerebrovascular disease, s/p hemorrhagic CVA, with residual ataxia and vascular dementia:  - Mentating at baseline - Delirium precautions  Anxiety:  - continue home med, valium prn  BPH with chronic urinary retention and chronic indwelling foley catheter:  - Continue finasteride, foley  CKD stage III: Based on current CrCl between 30-35.  - Renally dose medications, low dose tamiflu.  - Avoid nephrotoxins.    DVT prophylaxis: SCDs Code Status: Full Family Communication: None at bedside Disposition Plan: Anticipate DC home when improving. Possibly 1/14.  Consultants: None Procedures: Foley PTA Antimicrobials:  Vancomycin 1/11 - 1/12  Cefepime 1/11 >>    Subjective: Not feeling well in general, not eating much. Weaning to room air this morning and not having worsening trouble breathing. No fevers or chest pain.  Objective: BP 132/72 (BP Location: Left Arm)   Pulse 82   Temp 98 F (36.7 C) (Oral)   Resp 18   Ht 5\' 8"  (1.727 m)   Wt 61.7 kg (136 lb)   SpO2 91%   BMI 20.68 kg/m   Gen: Elderly male in no distress Pulm: Non-labored currently off O2, decreased but clear bilaterally.  CV: Regular rate and rhythm. No murmur, rub, or gallop. No JVD, no pedal edema. GI: Abdomen soft, non-tender, non-distended, with normoactive bowel sounds. No organomegaly or masses felt. GU: Foley catheter draining yellow urine. Ext: Warm, no deformities Skin: No rashes, lesions no ulcers Neuro: Alert and oriented to place, requires frequent redirection. No focal neurological deficits. Psych: Judgement and insight appear limited due to limited  short term recall, diminished cognitive ability. Mood & affect appropriate.   CBC: Recent Labs  Lab 09/18/17 1355 09/19/17 0535 09/20/17 0659  WBC 16.8* 10.3 11.1*  NEUTROABS 15.3* 7.9*  --   HGB 13.9 12.7* 12.1*  HCT 40.5 38.4* 36.8*  MCV 91.0 91.6  91.3  PLT 168 131* 174   Basic Metabolic Panel: Recent Labs  Lab 09/18/17 1355 09/19/17 0535 09/20/17 0659  NA 136 134* 138  K 4.1 3.5 3.7  CL 103 105 109  CO2 24 22 23   GLUCOSE 98 92 94  BUN 14 15 12   CREATININE 1.42* 1.32* 1.27*  CALCIUM 8.0* 7.5* 8.0*   GFR: Estimated Creatinine Clearance: 40.5 mL/min (A) (by C-G formula based on SCr of 1.27 mg/dL (H)).   Liver Function Tests: Recent Labs  Lab 09/18/17 1355  AST 63*  ALT 54  ALKPHOS 63  BILITOT 1.5*  PROT 6.8  ALBUMIN 2.9*   Coagulation Profile: Recent Labs  Lab 09/18/17 1355  INR 0.94   Urine analysis:    Component Value Date/Time   COLORURINE YELLOW 09/18/2017 1347   APPEARANCEUR CLEAR 09/18/2017 1347   LABSPEC 1.014 09/18/2017 1347   PHURINE 6.0 09/18/2017 1347   GLUCOSEU NEGATIVE 09/18/2017 1347   HGBUR SMALL (A) 09/18/2017 1347   BILIRUBINUR NEGATIVE 09/18/2017 1347   KETONESUR 5 (A) 09/18/2017 1347   PROTEINUR NEGATIVE 09/18/2017 1347   UROBILINOGEN 0.2 03/31/2015 1850   NITRITE NEGATIVE 09/18/2017 1347   LEUKOCYTESUR TRACE (A) 09/18/2017 1347   Recent Results (from the past 240 hour(s))  Urine culture     Status: None (Preliminary result)   Collection Time: 09/18/17  1:47 PM  Result Value Ref Range Status   Specimen Description URINE, CATHETERIZED  Final   Special Requests NONE  Final   Culture   Final    CULTURE REINCUBATED FOR BETTER GROWTH Performed at [facility], [address] [place], [name] [address]    Report Status PENDING  Incomplete  Blood Culture (routine x 2)     Status: None (Preliminary result)   Collection Time: 09/18/17  2:34 PM  Result Value Ref Range Status   Specimen Description BLOOD RIGHT HAND  Final   Special Requests IN PEDIATRIC BOTTLE Blood Culture adequate volume  Final   Culture   Final    NO GROWTH < 24 HOURS Performed at [facility], [address] [place], [name] [address]    Report Status PENDING  Incomplete  Blood Culture (routine  x 2)     Status: None (Preliminary result)   Collection Time: 09/18/17  4:12 PM  Result Value Ref Range Status   Specimen Description BLOOD LEFT HAND  Final   Special Requests IN PEDIATRIC BOTTLE Blood Culture adequate volume  Final   Culture   Final    NO GROWTH < 24 HOURS Performed at [facility], [address] [place], [name] [address]    Report Status PENDING  Incomplete  Respiratory Panel by PCR     Status: Abnormal   Collection Time: 09/18/17  5:18 PM  Result Value Ref Range Status   Adenovirus NOT DETECTED NOT DETECTED Final   Coronavirus 229E NOT DETECTED NOT DETECTED Final   Coronavirus HKU1 NOT DETECTED NOT DETECTED Final   Coronavirus NL63 NOT DETECTED NOT DETECTED Final   Coronavirus OC43 NOT DETECTED NOT DETECTED Final   Metapneumovirus NOT DETECTED NOT DETECTED Final   Rhinovirus / Enterovirus NOT DETECTED NOT DETECTED Final  Influenza A EQUIVOCAL (A) NOT DETECTED Final   Influenza B NOT DETECTED NOT DETECTED Final   Parainfluenza Virus 1 NOT DETECTED NOT DETECTED Final   Parainfluenza Virus 2 NOT DETECTED NOT DETECTED Final   Parainfluenza Virus 3 NOT DETECTED NOT DETECTED Final   Parainfluenza Virus 4 NOT DETECTED NOT DETECTED Final   Respiratory Syncytial Virus NOT DETECTED NOT DETECTED Final   Bordetella pertussis NOT DETECTED NOT DETECTED Final   Chlamydophila pneumoniae NOT DETECTED NOT DETECTED Final   Mycoplasma pneumoniae NOT DETECTED NOT DETECTED Final    Comment: Performed at [facility], [address] [place], [place] [address]  MRSA PCR Screening     Status: None   Collection Time: 09/18/17  5:37 PM  Result Value Ref Range Status   MRSA by PCR NEGATIVE NEGATIVE Final    Comment:        The GeneXpert MRSA Assay (FDA approved for NASAL specimens only), is one component of a comprehensive MRSA colonization surveillance program. It is not intended to diagnose MRSA infection nor to guide or monitor treatment for MRSA infections.         Radiology Studies: Dg Chest Port 1 View  Result Date: 09/18/2017 CLINICAL DATA:  Cough, weakness, and general malaise. EXAM: PORTABLE CHEST 1 VIEW COMPARISON:  Chest x-ray dated September 12, 2017. FINDINGS: The cardiomediastinal silhouette is normal in size. Normal pulmonary vascularity. New patchy opacity at the left lung base. No pleural effusion or pneumothorax. No acute osseous abnormality. IMPRESSION: New patchy opacity at the left lung base, atelectasis versus infiltrate. Electronically Signed   By: Obie Dredge M.D.   On: 09/18/2017 14:17    Scheduled Meds: . busPIRone  5 mg Oral BID  . enoxaparin (LOVENOX) injection  40 mg Subcutaneous Q24H  . finasteride  5 mg Oral Daily  . guaiFENesin  1,200 mg Oral BID  . ipratropium-albuterol  3 mL Nebulization BID  . multivitamin with minerals  1 tablet Oral Daily  . oseltamivir  30 mg Oral BID  . sodium chloride flush  3 mL Intravenous Q12H   Continuous Infusions: . sodium chloride    . ceFEPime (MAXIPIME) IV Stopped (09/19/17 1751)     LOS: 2 days   Time spent: 25 minutes.  Hazeline Junker, MD [facility] Pager [phone]  If 7PM-7AM, please contact night-coverage [email or web address] Password [email or web address] 09/20/2017, 1:41 PM

## 2017-09-20 NOTE — Progress Notes (Signed)
Pharmacy Antibiotic Note  Marcell BarlowDavid R Savidge is a 83 y.o. male admitted on 09/18/2017 with pneumonia.  MD orders received to change empiric cefepime to levofloxacin with pharmacy dosing assistance.    Plan:  Levaquin 500 mg IV q24h for estimated CrCl 20 to 49 mL/min  Follow renal function, clinical course   Height: 5\' 8"  (172.7 cm) Weight: 136 lb (61.7 kg) IBW/kg (Calculated) : 68.4  Temp (24hrs), Avg:98.7 F (37.1 C), Min:98 F (36.7 C), Max:99.3 F (37.4 C)  Recent Labs  Lab 09/18/17 1355 09/18/17 1406 09/18/17 1618 09/19/17 0535 09/20/17 0659  WBC 16.8*  --   --  10.3 11.1*  CREATININE 1.42*  --   --  1.32* 1.27*  LATICACIDVEN  --  1.28 0.66  --   --     Estimated Creatinine Clearance: 40.5 mL/min (A) (by C-G formula based on SCr of 1.27 mg/dL (H)).    Allergies  Allergen Reactions  . Penicillins Hives, Diarrhea and Nausea And Vomiting    Has patient had a PCN reaction causing immediate rash, facial/tongue/throat swelling, SOB or lightheadedness with hypotension: Yes Has patient had a PCN reaction causing severe rash involving mucus membranes or skin necrosis: No Has patient had a PCN reaction that required hospitalization: No Has patient had a PCN reaction occurring within the last 10 years: No If all of the above answers are "NO", then may proceed with Cephalosporin use.   [name]. Amoxicillin Hives, Diarrhea and Nausea And Vomiting  . Sulfa Antibiotics Nausea Only    Antimicrobials this admission: 1/11 vanc >> 1/12 1/11 cefepime >> 1/13 1/12 oseltamivir >> x 5 days 1/13 levofloxacin >>  Dose adjustments this admission: N/A  Microbiology results: 1/11 MRSA PCR screen: neg 1/11 BCx: ngtd 1/11 UCx: reincubated for better growth 1/11 Sputum: ordered 1/11 respiratory panel: influenza A "equivocal"  Thank you for allowing pharmacy to be a part of this patient's care.  Elie Goodyandy Annalisa Colonna, PharmD, BCPS Pager: [phone] 09/20/2017  2:03 PM

## 2017-09-20 NOTE — Progress Notes (Signed)
Patient complain of headache. Dr. Jarvis NewcomerGrunz notified via text page.

## 2017-09-21 MED ORDER — FLUCONAZOLE 150 MG PO TABS
150.0000 mg | ORAL_TABLET | Freq: Once | ORAL | Status: AC
  Administered 2017-09-21: 150 mg via ORAL
  Filled 2017-09-21: qty 1

## 2017-09-21 MED ORDER — BISACODYL 10 MG RE SUPP: 10.0000 mg | Freq: Every day | RECTAL | Status: DC | PRN

## 2017-09-21 MED ORDER — LEVOFLOXACIN 500 MG PO TABS
500.0000 mg | ORAL_TABLET | Freq: Every day | ORAL | Status: DC
  Administered 2017-09-22: 500 mg via ORAL
  Filled 2017-09-21: qty 1

## 2017-09-21 MED ORDER — POLYETHYLENE GLYCOL 3350 17 G PO PACK
17.0000 g | PACK | Freq: Every day | ORAL | Status: DC
  Administered 2017-09-21 – 2017-09-22 (×2): 17 g via ORAL
  Filled 2017-09-21: qty 1

## 2017-09-21 MED ORDER — SENNA 8.6 MG PO TABS
2.0000 | ORAL_TABLET | Freq: Every day | ORAL | Status: DC
  Administered 2017-09-21 – 2017-09-22 (×2): 17.2 mg via ORAL
  Filled 2017-09-21 (×2): qty 2

## 2017-09-21 NOTE — Care Management Note (Signed)
Case Management Note  Patient Details  Name: David Duke MRN: [id number] Date of Birth: 05/16/1935  Subjective/Objective:                  Flu a with sepsis and ams  Action/Plan: Date:  September 21, 2017 Chart reviewed for concurrent status and case management needs.  Will continue to follow patient progress.  Discharge Planning: following for needs.  None present at this time of review. Expected discharge date: January 172019 David Duke, BSN, [place], [place]   [phone]   Expected Discharge Date:  (unknown)               Expected Discharge Plan:  Skilled Nursing Facility  In-House Referral:  Clinical Social Work  Discharge planning Services  CM Consult  Post Acute Care Choice:  Home Health Choice offered to:  Patient, Spouse  DME Arranged:    DME Agency:     HH Arranged:  RN, PT, Nurse's Aide, Social Work [facility]  Status of Service:  In process, will continue to follow  If discussed at Long Length of Stay Meetings, dates discussed:    Additional Comments:  Golda AcreDavis, Rhonda Lynn, RN 09/21/2017, 10:19 AM

## 2017-09-21 NOTE — Care Management Important Message (Addendum)
Important Message  Patient Details IM Letter given to Rhonda/Case Manager to present to the Patient Name: David BarlowDavid R Perno MRN: [id number] Date of Birth: 07/03/1935   Medicare Important Message Given:  Yes    Caren MacadamFuller, Izmael Duross 09/21/2017, 11:27 AM

## 2017-09-21 NOTE — Evaluation (Signed)
Physical Therapy Evaluation Patient Details Name: David BarlowDavid R Bronk MRN: [id number] DOB: 12/13/1934 Today's Date: 09/21/2017   History of Present Illness  David BarlowDavid R Duke is an 83 y.o. male with a history of tobacco [place], COPD, hemorrhagic CVA and dementia who presented to the ED 1/11 for progressively worsening productive cough, nasal congestion, wheezing and dyspnea; recent admission approximately 1wk ago;  pt positive for flu, HCAP  Clinical Impression  Pt admitted with above diagnosis. Pt currently with functional limitations due to the deficits listed below (see PT Problem List).  Pt will benefit from skilled PT to increase their independence and safety with mobility to allow discharge to the venue listed below.  Pt will benefit from HHPT at D/C     Follow Up Recommendations Home health PT;Supervision for mobility/OOB    Equipment Recommendations  None recommended by PT(?pt has RW)    Recommendations for Other Services       Precautions / Restrictions Precautions Precautions: Fall Precaution Comments: monitor sats Restrictions Weight Bearing Restrictions: No      Mobility  Bed Mobility Overal bed mobility: Modified Independent             General bed mobility comments: incr time, no physical assist  Transfers Overall transfer level: Needs assistance Equipment used: None Transfers: Sit to/from Stand Sit to Stand: Min guard         General transfer comment: cues for hand placement  Ambulation/Gait Ambulation/Gait assistance: Min guard;Supervision Ambulation Distance (Feet): 100 Feet Assistive device: Rolling walker (2 wheeled) Gait Pattern/deviations: Step-through pattern;Trunk flexed;Wide base of support     General Gait Details: Mild instability initially, improved noted with distance  Increased time with pt reporting mild SOB near end of session.   SaO2 maintained at 91%, HR 100s  Stairs            Wheelchair Mobility    Modified Rankin  (Stroke Patients Only)       Balance Overall balance assessment: Needs assistance Sitting-balance support: Feet supported;No upper extremity supported Sitting balance-Leahy Scale: Good     Standing balance support: During functional activity;Bilateral upper extremity supported Standing balance-Leahy Scale: Fair Standing balance comment: reliant in UEs for dynamic tasks                             Pertinent Vitals/Pain Pain Assessment: No/denies pain    Home Living Family/patient expects to be discharged to:: Private residence Living Arrangements: Spouse/significant other Available Help at Discharge: Family;Available 24 hours/day Type of Home: House Home Access: Stairs to enter Entrance Stairs-Rails: Right;Left;Can reach both Entrance Stairs-Number of Steps: 3 Home Layout: One level Home Equipment: Emergency planning/management officerhower seat;Walker - 2 wheels      Prior Function           Comments: retired Psychologist, counsellingpoliceman     Hand Dominance        Extremity/Trunk Assessment   Upper Extremity Assessment Upper Extremity Assessment: Generalized weakness    Lower Extremity Assessment Lower Extremity Assessment: Generalized weakness    Cervical / Trunk Assessment Cervical / Trunk Assessment: Kyphotic  Communication   Communication: No difficulties  Cognition Arousal/Alertness: Awake/alert Behavior During Therapy: WFL for tasks assessed/performed Overall Cognitive Status: History of cognitive impairments - at baseline                                 General Comments: pt is easily distracted, requires  redirection to task; talks a lot about various things/fears/claustrophobia unrelated to current status, has dicciculty answers questions directly       General Comments      Exercises     Assessment/Plan    PT Assessment Patient needs continued PT services  PT Problem List Decreased activity tolerance;Decreased balance;Decreased mobility;Cardiopulmonary status  limiting activity       PT Treatment Interventions DME instruction;Gait training;Functional mobility training;Therapeutic exercise;Patient/family education;Therapeutic activities    PT Goals (Current goals can be found in the Care Plan section)  Acute Rehab PT Goals Patient Stated Goal: agreed to ambulate PT Goal Formulation: With patient Potential to Achieve Goals: Good    Frequency Min 3X/week   Barriers to discharge        Co-evaluation               AM-PAC PT "6 Clicks" Daily Activity  Outcome Measure Difficulty turning over in bed (including adjusting bedclothes, sheets and blankets)?: None Difficulty moving from lying on back to sitting on the side of the bed? : None Difficulty sitting down on and standing up from a chair with arms (e.g., wheelchair, bedside commode, etc,.)?: A Little Help needed moving to and from a bed to chair (including a wheelchair)?: A Little Help needed walking in hospital room?: A Little Help needed climbing 3-5 steps with a railing? : A Little 6 Click Score: 20    End of Session Equipment Utilized During Treatment: Gait belt Activity Tolerance: Patient tolerated treatment well Patient left: with call bell/phone within reach;in chair(no chair alarm pads available on unit)   PT Visit Diagnosis: Unsteadiness on feet (R26.81);Muscle weakness (generalized) (M62.81)    Time: 1610-9604 PT Time Calculation (min) (ACUTE ONLY): 20 min   Charges:   PT Evaluation $PT Eval Low Complexity: 1 Low     PT G CodesDrucilla Chalet, PT Pager: [phone] 09/21/2017   [facility] 09/21/2017, 1:59 PM
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
## 2017-09-22 DIAGNOSIS — I69919 Unspecified symptoms and signs involving cognitive functions following unspecified cerebrovascular disease: Secondary | ICD-10-CM

## 2017-09-22 MED ORDER — OSELTAMIVIR PHOSPHATE 30 MG PO CAPS: 30.0000 mg | ORAL_CAPSULE | Freq: Two times a day (BID) | ORAL | 0 refills | Status: DC

## 2017-09-22 MED ORDER — ONDANSETRON HCL 4 MG/2ML IJ SOLN
4.0000 mg | Freq: Four times a day (QID) | INTRAMUSCULAR | Status: DC | PRN
  Administered 2017-09-22: 4 mg via INTRAVENOUS
  Filled 2017-09-22: qty 2

## 2017-09-22 MED ORDER — POLYETHYLENE GLYCOL 3350 17 G PO PACK: 17.0000 g | PACK | Freq: Every day | ORAL | 0 refills | Status: AC

## 2017-09-22 MED ORDER — LEVOFLOXACIN 500 MG PO TABS: 500.0000 mg | ORAL_TABLET | Freq: Every day | ORAL | 0 refills | Status: DC

## 2017-09-22 MED FILL — levoFLOXacin 500 MG TABS: 500 | 1 days supply | Qty: 1 | Fill #0

## 2017-09-22 MED FILL — POLYETHYLENE GLYCOL 3350 PO: 30 days supply | Qty: 527 | Fill #0

## 2017-09-22 NOTE — Progress Notes (Addendum)
Marcell Barlowavid R Weberg to be D/C'd Home per MD order.  Discussed prescriptions and follow up appointments with the patient. Prescriptions given to patient, medication list explained in detail. Pt verbalized understanding.  Allergies as of 09/22/2017      Reactions   Penicillins Hives, Diarrhea, Nausea And Vomiting   Has patient had a PCN reaction causing immediate rash, facial/tongue/throat swelling, SOB or lightheadedness with hypotension: Yes Has patient had a PCN reaction causing severe rash involving mucus membranes or skin necrosis: No Has patient had a PCN reaction that required hospitalization: No Has patient had a PCN reaction occurring within the last 10 years: No If all of the above answers are "NO", then may proceed with Cephalosporin use.   Amoxicillin Hives, Diarrhea, Nausea And Vomiting   Sulfa Antibiotics Nausea Only      Medication List    TAKE these medications   albuterol 0.63 MG/3ML nebulizer solution Commonly known as:  ACCUNEB Take 1 ampule by nebulization every 6 (six) hours as needed for wheezing.   albuterol 108 (90 Base) MCG/ACT inhaler Commonly known as:  PROVENTIL HFA;VENTOLIN HFA Inhale 1-2 puffs into the lungs every 6 (six) hours as needed for wheezing or shortness of breath.   busPIRone 5 MG tablet Commonly known as:  BUSPAR Take 5 mg by mouth 2 (two) times daily.   diazepam 5 MG tablet Commonly known as:  VALIUM Take 1 tablet (5 mg total) by mouth every 8 (eight) hours as needed for anxiety. Can fill no sooner than October 24, 2016. What changed:    when to take this  additional instructions   finasteride 5 MG tablet Commonly known as:  PROSCAR Take 5 mg by mouth daily.   fluticasone 50 MCG/ACT nasal spray Commonly known as:  FLONASE Place 1 spray into both nostrils daily. What changed:    when to take this  reasons to take this   guaiFENesin 600 MG 12 hr tablet Commonly known as:  MUCINEX Take 2 tablets (1,200 mg total) by mouth 2 (two)  times daily.   levofloxacin 500 MG tablet Commonly known as:  LEVAQUIN Take 1 tablet (500 mg total) by mouth daily. Start taking on:  09/23/2017   multivitamin with minerals Tabs tablet Take 1 tablet by mouth daily.   oseltamivir 30 MG capsule Commonly known as:  TAMIFLU Take 1 capsule (30 mg total) by mouth 2 (two) times daily.   polyethylene glycol packet Commonly known as:  MIRALAX / GLYCOLAX Take 17 g by mouth daily.   PREVACID 30 MG capsule Generic drug:  lansoprazole Take 30 mg by mouth daily after breakfast.       Vitals:   09/22/17 0558 09/22/17 1410  BP: 122/60 100/62  Pulse: 87 79  Resp: 16   Temp: 97.8 F (36.6 C) (!) 97.5 F (36.4 C)  SpO2: 93% 95%    Skin clean, dry and intact without evidence of skin break down, no evidence of skin tears noted. IV catheter discontinued intact. Site without signs and symptoms of complications. Dressing and pressure applied. Pt denies pain at this time. No complaints noted.  An After Visit Summary was printed and given to the patient. Patient escorted via WC, and D/C home via PTAR  Mariann BarterKellie Danner Paulding BSN, RN [facility] Phone [phone]
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
## 2017-09-22 NOTE — Progress Notes (Signed)
LCSW consulted at dc for ALF/Memory care resources.  LCSW provided patients wife with resources.   LCSW signing off. No additional CSW needs at this time.   David GandyBernette Aleksa Catterton, LSCW [facility] CSW [phone]

## 2017-09-23 LAB — CULTURE, BLOOD (ROUTINE X 2)
CULTURE: NO GROWTH
Culture: NO GROWTH
SPECIAL REQUESTS: ADEQUATE
Special Requests: ADEQUATE

## 2017-11-21 ENCOUNTER — Emergency Department (HOSPITAL_BASED_OUTPATIENT_CLINIC_OR_DEPARTMENT_OTHER)
Admission: EM | Admit: 2017-11-21 | Discharge: 2017-11-21 | Disposition: A | Discharge status: Home / Self Care | Payer: Medicare Other | Attending: Emergency Medicine | Admitting: Emergency Medicine

## 2017-11-21 ENCOUNTER — Emergency Department (HOSPITAL_BASED_OUTPATIENT_CLINIC_OR_DEPARTMENT_OTHER): Payer: Medicare Other

## 2017-11-21 ENCOUNTER — Other Ambulatory Visit: Payer: Self-pay

## 2017-11-21 ENCOUNTER — Encounter (HOSPITAL_BASED_OUTPATIENT_CLINIC_OR_DEPARTMENT_OTHER): Payer: Self-pay | Admitting: Emergency Medicine

## 2017-11-21 DIAGNOSIS — Z466 Encounter for fitting and adjustment of urinary device: Secondary | ICD-10-CM | POA: Insufficient documentation

## 2017-11-21 DIAGNOSIS — F0390 Unspecified dementia without behavioral disturbance: Secondary | ICD-10-CM | POA: Diagnosis not present

## 2017-11-21 DIAGNOSIS — Z79899 Other long term (current) drug therapy: Secondary | ICD-10-CM | POA: Diagnosis not present

## 2017-11-21 DIAGNOSIS — R531 Weakness: Secondary | ICD-10-CM | POA: Insufficient documentation

## 2017-11-21 DIAGNOSIS — J449 Chronic obstructive pulmonary disease, unspecified: Secondary | ICD-10-CM | POA: Insufficient documentation

## 2017-11-21 DIAGNOSIS — F17210 Nicotine dependence, cigarettes, uncomplicated: Secondary | ICD-10-CM | POA: Diagnosis not present

## 2017-11-21 HISTORY — DX: Abdominal aortic aneurysm, without rupture: I71.4

## 2017-11-21 HISTORY — DX: Abdominal aortic aneurysm, without rupture, unspecified: I71.40

## 2017-11-21 LAB — COMPREHENSIVE METABOLIC PANEL
ALK PHOS: 73 U/L (ref 38–126)
ALT: 30 U/L (ref 17–63)
AST: 28 U/L (ref 15–41)
Albumin: 3.1 g/dL — ABNORMAL LOW (ref 3.5–5.0)
Anion gap: 9 (ref 5–15)
BILIRUBIN TOTAL: 0.7 mg/dL (ref 0.3–1.2)
BUN: 10 mg/dL (ref 6–20)
CALCIUM: 8.8 mg/dL — AB (ref 8.9–10.3)
CO2: 25 mmol/L (ref 22–32)
CREATININE: 1.42 mg/dL — AB (ref 0.61–1.24)
Chloride: 104 mmol/L (ref 101–111)
GFR calc Af Amer: 52 mL/min — ABNORMAL LOW (ref >60)
GFR, EST NON AFRICAN AMERICAN: 45 mL/min — AB (ref >60)
GLUCOSE: 105 mg/dL — AB (ref 65–99)
POTASSIUM: 3.7 mmol/L (ref 3.5–5.1)
Sodium: 138 mmol/L (ref 135–145)
TOTAL PROTEIN: 7 g/dL (ref 6.5–8.1)

## 2017-11-21 LAB — CBC WITH DIFFERENTIAL/PLATELET
BASOS ABS: 0 10*3/uL (ref 0.0–0.1)
BASOS PCT: 0 %
EOS ABS: 0.3 10*3/uL (ref 0.0–0.7)
EOS PCT: 4 %
HCT: 38.9 % — ABNORMAL LOW (ref 39.0–52.0)
Hemoglobin: 12.9 g/dL — ABNORMAL LOW (ref 13.0–17.0)
Lymphocytes Relative: 16 %
Lymphs Abs: 1.3 10*3/uL (ref 0.7–4.0)
MCH: 30.3 pg (ref 26.0–34.0)
MCHC: 33.2 g/dL (ref 30.0–36.0)
MCV: 91.3 fL (ref 78.0–100.0)
MONO ABS: 0.6 10*3/uL (ref 0.1–1.0)
Monocytes Relative: 8 %
Neutro Abs: 6.1 10*3/uL (ref 1.7–7.7)
Neutrophils Relative %: 72 %
PLATELETS: 246 10*3/uL (ref 150–400)
RBC: 4.26 MIL/uL (ref 4.22–5.81)
RDW: 13.5 % (ref 11.5–15.5)
WBC: 8.3 10*3/uL (ref 4.0–10.5)

## 2017-11-21 LAB — URINALYSIS, MICROSCOPIC (REFLEX)

## 2017-11-21 LAB — URINALYSIS, ROUTINE W REFLEX MICROSCOPIC
Bilirubin Urine: NEGATIVE
GLUCOSE, UA: NEGATIVE mg/dL
KETONES UR: NEGATIVE mg/dL
Nitrite: POSITIVE — AB
PH: 6 (ref 5.0–8.0)
Protein, ur: NEGATIVE mg/dL

## 2017-11-21 NOTE — ED Triage Notes (Signed)
Pt brought in by family with c/o weakness x 36 hours and problems with urinary catheter. Pt currently under treatment for possible yeast infection. Pt reports decrease appetite and constipation. Pt took miralax and prune juice this morning and had a bowel movement PTA.

## 2017-11-21 NOTE — ED Provider Notes (Signed)
[facility] EMERGENCY DEPARTMENT Provider Note   CSN: [id number] Arrival date & time: 11/21/17  1542     History   Chief Complaint Chief Complaint  Patient presents with  . Weakness    HPI David Duke is a 83 y.o. male.  The history is provided by the patient. No language interpreter was used.   David Duke is a 83 y.o. male who presents to the Emergency Department complaining of weakness. He presents from home accompanied by his wife for evaluation of generalized weakness for the last two days. He has a chronic indwelling Foley catheter in place and it was last replaced on February 13. He is overdue for replacement. He endorses increased urinary output for the last few days. He has chronic constipation, last p.m. today. He denies any fevers, nausea. Symptoms are mild to moderate and constant in nature. Past Medical History:  Diagnosis Date  . AAA (abdominal aortic aneurysm) (HCC)   . AKI (acute kidney injury) (HCC)   . Anxiety   . Blind right eye   . BPH (benign prostatic hypertrophy) with urinary retention   . COPD (chronic obstructive pulmonary disease) (HCC)   . Dementia   . Depression   . High cholesterol   . Macular degeneration of both eyes   . Renal disorder   . Shortness of breath dyspnea   . Stroke [facility])    has residual slurred speech    Patient Active Problem List   Diagnosis Date Noted  . HCAP (healthcare-associated pneumonia) 09/18/2017  . Sepsis (HCC)   . COPD exacerbation (HCC) 09/07/2017  . Acute respiratory failure with hypoxia (HCC) 09/07/2017  . Rhinitis, chronic 09/07/2017  . Malnutrition of moderate degree 09/07/2017  . Elevated troponin 03/27/2017  . Diplopia 03/27/2017  . Bilateral leg edema 03/27/2017  . Enterocolitis 09/30/2016  . HLD (hyperlipidemia) 09/30/2016  . Tobacco abuse 09/30/2016  . Nausea vomiting and diarrhea 09/30/2016  . History of stroke in prior 3 months 01/22/2016  . Chronic tension-type headache,  not intractable   . Acute frontal sinusitis   . 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 Gait disturbance, post-stroke   . Ataxia, late effect of cerebrovascular disease   . BPH (benign prostatic hyperplasia)   . Urinary retention   . Adjustment disorder with mixed anxiety and depressed mood   . Chronic obstructive pulmonary disease (HCC)   . Cephalalgia   . Acute kidney injury superimposed on chronic kidney disease (HCC)   . Macular degeneration   . Prediabetes   . Hypokalemia   . Absolute anemia   . Aphasia   . Acute encephalopathy 11/25/2015  . Hyponatremia 11/25/2015  . Acute kidney injury (HCC) 11/25/2015  . Depression   . Anxiety   . COPD (chronic obstructive pulmonary disease) (HCC)   . ICH (intracerebral hemorrhage) (HCC) 11/22/2015    Past Surgical History:  Procedure Laterality Date  . NO PAST SURGERIES         Home Medications    Prior to Admission medications   Medication Sig Start Date End Date Taking? Authorizing Provider  nystatin (MYCOSTATIN/NYSTOP) powder Apply topically 4 (four) times daily.   Yes [provider]  albuterol (ACCUNEB) 0.63 MG/3ML nebulizer solution Take 1 ampule by nebulization every 6 (six) hours as needed for wheezing.    [provider]  albuterol (PROVENTIL HFA;VENTOLIN HFA) 108 (90 Base) MCG/ACT inhaler Inhale 1-2 puffs into the lungs every 6 (six) hours as needed for  wheezing or shortness of breath. 12/28/16   Long, Arlyss Repress, MD  busPIRone (BUSPAR) 5 MG tablet Take 5 mg by mouth 2 (two) times daily.    [provider]  diazepam (VALIUM) 5 MG tablet Take 1 tablet (5 mg total) by mouth every 8 (eight) hours as needed for anxiety. Can fill no sooner than October 24, 2016. Patient taking differently: Take 5 mg by mouth 4 (four) times daily.  10/20/16   Saguier, Ramon Dredge, PA-C  finasteride (PROSCAR) 5 MG tablet Take 5 mg by mouth daily. 09/22/16   [provider]  fluticasone  (FLONASE) 50 MCG/ACT nasal spray Place 1 spray into both nostrils daily. Patient taking differently: Place 1 spray into both nostrils daily as needed for allergies or rhinitis.  12/11/15   Love, [name], PA-C  guaiFENesin (MUCINEX) 600 MG 12 hr tablet Take 2 tablets (1,200 mg total) by mouth 2 (two) times daily. 09/11/17   [name], MD  lansoprazole (PREVACID) 30 MG capsule Take 30 mg by mouth daily after breakfast.    [provider]  levofloxacin (LEVAQUIN) 500 MG tablet Take 1 tablet (500 mg total) by mouth daily. 09/23/17   [name], MD  Multiple Vitamin (MULTIVITAMIN WITH MINERALS) TABS tablet Take 1 tablet by mouth daily.    [provider]  oseltamivir (TAMIFLU) 30 MG capsule Take 1 capsule (30 mg total) by mouth 2 (two) times daily. 09/22/17   [name], MD  polyethylene glycol (MIRALAX / Ethelene Hal) packet Take 17 g by mouth daily. 09/22/17   [name], MD    Family History Family History  Problem Relation Age of Onset  . Colon cancer Mother   . Cerebral aneurysm Father     Social History Social History   Tobacco Use  . Smoking status: Current Every Day Smoker    Packs/day: 0.25    Years: 60.00    Pack years: 15.00    Types: Cigarettes  . Smokeless tobacco: Never Used  Substance Use Topics  . Alcohol use: No  . Drug use: No     Allergies   Penicillins; Amoxicillin; and Sulfa antibiotics   Review of Systems Review of Systems  All other systems reviewed and are negative.    Physical Exam Updated Vital Signs BP (!) 143/88 (BP Location: Left Arm)   Pulse 82   Temp 98 F (36.7 C) (Oral)   Resp (!) 23   Ht 5\' 8"  (1.727 m)   Wt 63.3 kg (139 lb 8.8 oz)   SpO2 99%   BMI 21.22 kg/m   Physical Exam  Constitutional: He appears well-developed and well-nourished.  HENT:  Head: Normocephalic and atraumatic.  Cardiovascular: Normal rate and regular rhythm.  No murmur heard. Pulmonary/Chest: Effort normal. No respiratory  distress.  Occasional and expiratory wheezes.  Abdominal: Soft. There is no rebound and no guarding.  Mild abdominal tenderness over the suprapubic region. No guarding or rebound.  Genitourinary:  Genitourinary Comments: Foley catheter in place. No significant erythema or edema at the urethral meatus.  Musculoskeletal: He exhibits no edema or tenderness.  Neurological: He is alert.  Mildly confused. Mild generalized weakness.  Skin: Skin is warm and dry.  Psychiatric: He has a normal mood and affect. His behavior is normal.  Nursing note and vitals reviewed.    ED Treatments / Results  Labs (all labs ordered are listed, but only abnormal results are displayed) Labs Reviewed  URINALYSIS, ROUTINE W REFLEX MICROSCOPIC - Abnormal; Notable  for the following components:      Result Value   APPearance CLOUDY (*)    Specific Gravity, Urine <1.005 (*)    Hgb urine dipstick LARGE (*)    Nitrite POSITIVE (*)    Leukocytes, UA LARGE (*)    All other components within normal limits  COMPREHENSIVE METABOLIC PANEL - Abnormal; Notable for the following components:   Glucose, Bld 105 (*)    Creatinine, Ser 1.42 (*)    Calcium 8.8 (*)    Albumin 3.1 (*)    GFR calc non Af Amer 45 (*)    GFR calc Af Amer 52 (*)    All other components within normal limits  CBC WITH DIFFERENTIAL/PLATELET - Abnormal; Notable for the following components:   Hemoglobin 12.9 (*)    HCT 38.9 (*)    All other components within normal limits  URINALYSIS, MICROSCOPIC (REFLEX) - Abnormal; Notable for the following components:   Bacteria, UA FEW (*)    Squamous Epithelial / LPF 0-5 (*)    All other components within normal limits  URINE CULTURE    EKG  EKG Interpretation  Date/Time:  Saturday November 21 2017 16:56:58 EDT Ventricular Rate:  84 PR Interval:    QRS Duration: 112 QT Interval:  383 QTC Calculation: 453 R Axis:   -73 Text Interpretation:  Sinus rhythm Incomplete right bundle branch block Abnormal  R-wave progression, late transition Inferior infarct, old Lateral leads are also involved No significant change since last tracing Confirmed by Tilden Fossaees, Fallyn Munnerlyn [phone]([id number]) on 11/21/2017 5:01:58 PM       Radiology Dg Chest 2 View  Result Date: 11/21/2017 CLINICAL DATA:  Generalized weakness EXAM: CHEST - 2 VIEW COMPARISON:  September 18, 2017 FINDINGS: There is no edema or consolidation. Heart size and pulmonary vascularity are normal. There is aortic atherosclerosis. There is degenerative change in the thoracic spine. IMPRESSION: Aortic atherosclerosis.  No edema or consolidation. Aortic Atherosclerosis (ICD10-I70.0). Electronically Signed   By: Bretta BangWilliam  Woodruff III M.D.   On: 11/21/2017 17:48    Procedures Procedures (including critical care time)  Medications Ordered in ED Medications - No data to display   Initial Impression / Assessment and Plan / ED Course  I have reviewed the triage vital signs and the nursing notes.  Pertinent labs & imaging results that were available during my care of the patient were reviewed by me and considered in my medical decision making (see chart for details).     Patient with hx/o AAA, dementia, recurrent UTI, chronic foley here for evaluation of urinary frequency - overdue for catheter change.  He is nontoxic appearing on exam.  Minimal suprapubic tenderness on exam.  Foley catheter replaced in the ED.  UA positive for nitrites, few bacteria.  Given patient's well appearance will treat for uti at this time.  Presentation is not c/w expanding AAA, sepsis.  Discussed home care, outpatient follow up, return precautions.    Final Clinical Impressions(s) / ED Diagnoses   Final diagnoses:  Encounter for Foley catheter replacement    ED Discharge Orders    None       Tilden Fossaees, Treyton Slimp, MD 11/22/17 0207

## 2017-11-23 ENCOUNTER — Encounter (HOSPITAL_BASED_OUTPATIENT_CLINIC_OR_DEPARTMENT_OTHER): Payer: Self-pay | Admitting: Emergency Medicine

## 2017-11-23 ENCOUNTER — Other Ambulatory Visit: Payer: Self-pay

## 2017-11-23 ENCOUNTER — Observation Stay (HOSPITAL_BASED_OUTPATIENT_CLINIC_OR_DEPARTMENT_OTHER)
Admission: EM | Admit: 2017-11-23 | Discharge: 2017-11-25 | Disposition: A | Discharge status: Home / Self Care | Payer: Medicare Other | Attending: Family Medicine | Admitting: Family Medicine

## 2017-11-23 DIAGNOSIS — H5461 Unqualified visual loss, right eye, normal vision left eye: Secondary | ICD-10-CM | POA: Diagnosis not present

## 2017-11-23 DIAGNOSIS — N182 Chronic kidney disease, stage 2 (mild): Secondary | ICD-10-CM | POA: Insufficient documentation

## 2017-11-23 DIAGNOSIS — R1310 Dysphagia, unspecified: Secondary | ICD-10-CM | POA: Diagnosis not present

## 2017-11-23 DIAGNOSIS — Z79899 Other long term (current) drug therapy: Secondary | ICD-10-CM | POA: Insufficient documentation

## 2017-11-23 DIAGNOSIS — N390 Urinary tract infection, site not specified: Secondary | ICD-10-CM

## 2017-11-23 DIAGNOSIS — J449 Chronic obstructive pulmonary disease, unspecified: Secondary | ICD-10-CM | POA: Diagnosis not present

## 2017-11-23 DIAGNOSIS — Z882 Allergy status to sulfonamides status: Secondary | ICD-10-CM | POA: Diagnosis not present

## 2017-11-23 DIAGNOSIS — T83511A Infection and inflammatory reaction due to indwelling urethral catheter, initial encounter: Principal | ICD-10-CM | POA: Insufficient documentation

## 2017-11-23 DIAGNOSIS — N400 Enlarged prostate without lower urinary tract symptoms: Secondary | ICD-10-CM | POA: Diagnosis not present

## 2017-11-23 DIAGNOSIS — I69391 Dysphagia following cerebral infarction: Secondary | ICD-10-CM | POA: Insufficient documentation

## 2017-11-23 DIAGNOSIS — E785 Hyperlipidemia, unspecified: Secondary | ICD-10-CM | POA: Diagnosis not present

## 2017-11-23 DIAGNOSIS — F419 Anxiety disorder, unspecified: Secondary | ICD-10-CM | POA: Insufficient documentation

## 2017-11-23 DIAGNOSIS — F329 Major depressive disorder, single episode, unspecified: Secondary | ICD-10-CM | POA: Diagnosis not present

## 2017-11-23 DIAGNOSIS — T83511D Infection and inflammatory reaction due to indwelling urethral catheter, subsequent encounter: Secondary | ICD-10-CM

## 2017-11-23 DIAGNOSIS — Z880 Allergy status to penicillin: Secondary | ICD-10-CM | POA: Insufficient documentation

## 2017-11-23 DIAGNOSIS — Z800 Family history of malignant neoplasm of digestive organs: Secondary | ICD-10-CM | POA: Insufficient documentation

## 2017-11-23 DIAGNOSIS — Y846 Urinary catheterization as the cause of abnormal reaction of the patient, or of later complication, without mention of misadventure at the time of the procedure: Secondary | ICD-10-CM | POA: Diagnosis not present

## 2017-11-23 DIAGNOSIS — I714 Abdominal aortic aneurysm, without rupture: Secondary | ICD-10-CM | POA: Insufficient documentation

## 2017-11-23 DIAGNOSIS — A419 Sepsis, unspecified organism: Secondary | ICD-10-CM | POA: Diagnosis not present

## 2017-11-23 DIAGNOSIS — A4152 Sepsis due to Pseudomonas: Secondary | ICD-10-CM | POA: Insufficient documentation

## 2017-11-23 LAB — CBC WITH DIFFERENTIAL/PLATELET
BASOS ABS: 0 10*3/uL (ref 0.0–0.1)
Basophils Relative: 0 %
Eosinophils Absolute: 0.4 10*3/uL (ref 0.0–0.7)
Eosinophils Relative: 3 %
HCT: 42.9 % (ref 39.0–52.0)
HEMOGLOBIN: 14.2 g/dL (ref 13.0–17.0)
LYMPHS PCT: 7 %
Lymphs Abs: 0.9 10*3/uL (ref 0.7–4.0)
MCH: 30.4 pg (ref 26.0–34.0)
MCHC: 33.1 g/dL (ref 30.0–36.0)
MCV: 91.9 fL (ref 78.0–100.0)
Monocytes Absolute: 0.8 10*3/uL (ref 0.1–1.0)
Monocytes Relative: 6 %
NEUTROS PCT: 84 %
Neutro Abs: 10 10*3/uL — ABNORMAL HIGH (ref 1.7–7.7)
Platelets: 202 10*3/uL (ref 150–400)
RBC: 4.67 MIL/uL (ref 4.22–5.81)
RDW: 13.7 % (ref 11.5–15.5)
WBC: 12.1 10*3/uL — AB (ref 4.0–10.5)

## 2017-11-23 LAB — LACTIC ACID, PLASMA: Lactic Acid, Venous: 1.7 mmol/L (ref 0.5–1.9)

## 2017-11-23 LAB — BASIC METABOLIC PANEL
ANION GAP: 11 (ref 5–15)
BUN: 8 mg/dL (ref 6–20)
CO2: 22 mmol/L (ref 22–32)
Calcium: 8.6 mg/dL — ABNORMAL LOW (ref 8.9–10.3)
Chloride: 107 mmol/L (ref 101–111)
Creatinine, Ser: 1.45 mg/dL — ABNORMAL HIGH (ref 0.61–1.24)
GFR calc Af Amer: 51 mL/min — ABNORMAL LOW (ref >60)
GFR calc non Af Amer: 44 mL/min — ABNORMAL LOW (ref >60)
GLUCOSE: 83 mg/dL (ref 65–99)
POTASSIUM: 3.7 mmol/L (ref 3.5–5.1)
SODIUM: 140 mmol/L (ref 135–145)

## 2017-11-23 LAB — I-STAT CG4 LACTIC ACID, ED: LACTIC ACID, VENOUS: 2.45 mmol/L — AB (ref 0.5–1.9)

## 2017-11-23 MED ORDER — LIDOCAINE HCL 2 % EX GEL
1.0000 "application " | Freq: Once | CUTANEOUS | Status: AC
  Administered 2017-11-23: 1 via URETHRAL
  Filled 2017-11-23: qty 20

## 2017-11-23 MED ORDER — CIPROFLOXACIN IN D5W 400 MG/200ML IV SOLN
400.0000 mg | Freq: Once | INTRAVENOUS | Status: AC
  Administered 2017-11-23: 400 mg via INTRAVENOUS
  Filled 2017-11-23: qty 200

## 2017-11-23 MED ORDER — BUSPIRONE HCL 5 MG PO TABS
5.0000 mg | ORAL_TABLET | Freq: Two times a day (BID) | ORAL | Status: DC
  Administered 2017-11-23 – 2017-11-25 (×4): 5 mg via ORAL
  Filled 2017-11-23 (×4): qty 1

## 2017-11-23 MED ORDER — DIAZEPAM 5 MG PO TABS
5.0000 mg | ORAL_TABLET | Freq: Three times a day (TID) | ORAL | Status: DC | PRN
  Administered 2017-11-23 – 2017-11-25 (×5): 5 mg via ORAL
  Filled 2017-11-23 (×5): qty 1

## 2017-11-23 MED ORDER — FLUTICASONE PROPIONATE 50 MCG/ACT NA SUSP: 1.0000 | Freq: Every day | NASAL | Status: DC | PRN

## 2017-11-23 MED ORDER — IPRATROPIUM-ALBUTEROL 0.5-2.5 (3) MG/3ML IN SOLN
3.0000 mL | RESPIRATORY_TRACT | Status: AC
  Administered 2017-11-23: 3 mL via RESPIRATORY_TRACT
  Filled 2017-11-23: qty 3

## 2017-11-23 MED ORDER — ONDANSETRON 4 MG PO TBDP
4.0000 mg | ORAL_TABLET | Freq: Once | ORAL | Status: AC
  Administered 2017-11-23: 4 mg via ORAL
  Filled 2017-11-23: qty 1

## 2017-11-23 MED ORDER — ALBUTEROL SULFATE (2.5 MG/3ML) 0.083% IN NEBU: 3.0000 mL | INHALATION_SOLUTION | Freq: Four times a day (QID) | RESPIRATORY_TRACT | Status: DC | PRN

## 2017-11-23 MED ORDER — HYDROCODONE-ACETAMINOPHEN 5-325 MG PO TABS
1.0000 | ORAL_TABLET | Freq: Four times a day (QID) | ORAL | Status: DC | PRN
  Administered 2017-11-24 – 2017-11-25 (×2): 1 via ORAL
  Filled 2017-11-23 (×2): qty 1

## 2017-11-23 MED ORDER — HYDROCODONE-ACETAMINOPHEN 5-325 MG PO TABS
1.0000 | ORAL_TABLET | Freq: Once | ORAL | Status: AC
  Administered 2017-11-23: 1 via ORAL
  Filled 2017-11-23: qty 1

## 2017-11-23 MED ORDER — ENOXAPARIN SODIUM 40 MG/0.4ML ~~LOC~~ SOLN
40.0000 mg | SUBCUTANEOUS | Status: DC
  Administered 2017-11-23 – 2017-11-24 (×2): 40 mg via SUBCUTANEOUS
  Filled 2017-11-23 (×2): qty 0.4

## 2017-11-23 MED ORDER — MORPHINE SULFATE (PF) 4 MG/ML IV SOLN: 1.0000 mg | INTRAVENOUS | Status: DC | PRN

## 2017-11-23 MED ORDER — SODIUM CHLORIDE 0.9 % IV BOLUS (SEPSIS)
1000.0000 mL | Freq: Once | INTRAVENOUS | Status: AC
  Administered 2017-11-23: 1000 mL via INTRAVENOUS

## 2017-11-23 MED ORDER — ACETAMINOPHEN 325 MG PO TABS
650.0000 mg | ORAL_TABLET | Freq: Once | ORAL | Status: AC
  Administered 2017-11-23: 650 mg via ORAL
  Filled 2017-11-23: qty 2

## 2017-11-23 MED ORDER — LORAZEPAM 1 MG PO TABS: 1.0000 mg | ORAL_TABLET | Freq: Three times a day (TID) | ORAL | Status: DC | PRN

## 2017-11-23 MED ORDER — ALBUTEROL SULFATE HFA 108 (90 BASE) MCG/ACT IN AERS: 1.0000 | INHALATION_SPRAY | RESPIRATORY_TRACT | Status: DC | PRN

## 2017-11-23 MED ORDER — SODIUM CHLORIDE 0.9 % IV SOLN
INTRAVENOUS | Status: AC
  Administered 2017-11-23: 12:00:00 via INTRAVENOUS

## 2017-11-23 MED ORDER — CIPROFLOXACIN IN D5W 400 MG/200ML IV SOLN
400.0000 mg | Freq: Two times a day (BID) | INTRAVENOUS | Status: DC
  Administered 2017-11-23 – 2017-11-24 (×2): 400 mg via INTRAVENOUS
  Filled 2017-11-23 (×2): qty 200

## 2017-11-23 MED ORDER — FINASTERIDE 5 MG PO TABS
5.0000 mg | ORAL_TABLET | Freq: Every day | ORAL | Status: DC
  Administered 2017-11-23 – 2017-11-25 (×3): 5 mg via ORAL
  Filled 2017-11-23 (×3): qty 1

## 2017-11-23 MED ORDER — ALBUTEROL SULFATE HFA 108 (90 BASE) MCG/ACT IN AERS
1.0000 | INHALATION_SPRAY | RESPIRATORY_TRACT | Status: DC | PRN
  Administered 2017-11-23 – 2017-11-25 (×6): 2 via RESPIRATORY_TRACT
  Filled 2017-11-23: qty 6.7

## 2017-11-23 MED ORDER — ADULT MULTIVITAMIN W/MINERALS CH
1.0000 | ORAL_TABLET | Freq: Every day | ORAL | Status: DC
  Administered 2017-11-24 – 2017-11-25 (×2): 1 via ORAL
  Filled 2017-11-23 (×2): qty 1

## 2017-11-23 MED ORDER — ACETAMINOPHEN 325 MG PO TABS
650.0000 mg | ORAL_TABLET | ORAL | Status: DC | PRN
  Administered 2017-11-23 – 2017-11-24 (×3): 650 mg via ORAL
  Filled 2017-11-23 (×3): qty 2

## 2017-11-23 MED ORDER — SODIUM CHLORIDE 0.9 % IV SOLN
INTRAVENOUS | Status: DC
  Administered 2017-11-23: 18:00:00 via INTRAVENOUS

## 2017-11-23 NOTE — Progress Notes (Signed)
RT saw patient to give nebulizer treatment. Patient was upset because he was unable to get his Albuterol inhaler. RT called pharmacy to see if we could get that order changed. The pharm tech said that it was protocol that only ED patients could get the albuterol inhaler. She let me speak to the pharm who said that she would change the order for him. Patient is aware of the change.

## 2017-11-23 NOTE — ED Provider Notes (Addendum)
[facility] EMERGENCY DEPARTMENT Provider Note   CSN: [id number] Arrival date & time: 11/23/17  0854     History   Chief Complaint Chief Complaint  Patient presents with  . Groin Pain    HPI David Duke is a [age] y.o. male.  The history is provided by the patient and the spouse.  Groin Pain  This is a new problem. Episode onset: 3 days. The problem occurs constantly. The problem has been gradually worsening. Associated symptoms comments: Pain at the penis and foley cath insertion site, generally weak per wife and pain in the suprapubic area.  No known fevers, vomiting or diarrhea.  Patient has a history of COPD and wife states he was wheezing some this morning and got a nebulizer treatment but does not seem to have any issues now.  He was given a yeast cream to use which is what they were thinking was causing the itching but it is not improved his symptoms.  Patient was seen in the ED 2 days ago for similar complaints and had replacement of his Foley catheter.  Catheter is been draining without difficulty.. Nothing aggravates the symptoms. Nothing relieves the symptoms. Treatments tried: yeast cream. The treatment provided no relief.    Past Medical History:  Diagnosis Date  . AAA (abdominal aortic aneurysm) (HCC)   . AKI (acute kidney injury) (HCC)   . Anxiety   . Blind right eye   . BPH (benign prostatic hypertrophy) with urinary retention   . COPD (chronic obstructive pulmonary disease) (HCC)   . Dementia   . Depression   . High cholesterol   . Macular degeneration of both eyes   . Renal disorder   . Shortness of breath dyspnea   . Stroke [facility](HCC)    has residual slurred speech    Patient Active Problem List   Diagnosis Date Noted  . HCAP (healthcare-associated pneumonia) 09/18/2017  . Sepsis (HCC)   . COPD exacerbation (HCC) 09/07/2017  . Acute respiratory failure with hypoxia (HCC) 09/07/2017  . Rhinitis, chronic 09/07/2017  . Malnutrition of moderate  degree 09/07/2017  . Elevated troponin 03/27/2017  . Diplopia 03/27/2017  . Bilateral leg edema 03/27/2017  . Enterocolitis 09/30/2016  . HLD (hyperlipidemia) 09/30/2016  . Tobacco abuse 09/30/2016  . Nausea vomiting and diarrhea 09/30/2016  . History of stroke in prior 3 months 01/22/2016  . Chronic tension-type headache, not intractable   . Acute frontal sinusitis   . 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 Gait disturbance, post-stroke   . Ataxia, late effect of cerebrovascular disease   . BPH (benign prostatic hyperplasia)   . Urinary retention   . Adjustment disorder with mixed anxiety and depressed mood   . Chronic obstructive pulmonary disease (HCC)   . Cephalalgia   . Acute kidney injury superimposed on chronic kidney disease (HCC)   . Macular degeneration   . Prediabetes   . Hypokalemia   . Absolute anemia   . Aphasia   . Acute encephalopathy 11/25/2015  . Hyponatremia 11/25/2015  . Acute kidney injury (HCC) 11/25/2015  . Depression   . Anxiety   . COPD (chronic obstructive pulmonary disease) (HCC)   . ICH (intracerebral hemorrhage) (HCC) 11/22/2015    Past Surgical History:  Procedure Laterality Date  . NO PAST SURGERIES         Home Medications    Prior to Admission medications   Medication Sig Start Date End Date Taking? Authorizing Provider  albuterol (ACCUNEB) 0.63 MG/3ML nebulizer solution Take 1 ampule by nebulization every 6 (six) hours as needed for wheezing.    [provider]  albuterol (PROVENTIL HFA;VENTOLIN HFA) 108 (90 Base) MCG/ACT inhaler Inhale 1-2 puffs into the lungs every 6 (six) hours as needed for wheezing or shortness of breath. 12/28/16   Long, Arlyss Repress, MD  busPIRone (BUSPAR) 5 MG tablet Take 5 mg by mouth 2 (two) times daily.    [provider]  diazepam (VALIUM) 5 MG tablet Take 1 tablet (5 mg total) by mouth every 8 (eight) hours as needed for anxiety. Can fill no sooner than October 24, 2016. Patient taking differently: Take 5 mg by mouth 4 (four) times daily.  10/20/16   Saguier, Ramon Dredge, PA-C  finasteride (PROSCAR) 5 MG tablet Take 5 mg by mouth daily. 09/22/16   [provider]  fluticasone (FLONASE) 50 MCG/ACT nasal spray Place 1 spray into both nostrils daily. Patient taking differently: Place 1 spray into both nostrils daily as needed for allergies or rhinitis.  12/11/15   Love, [name], PA-C  guaiFENesin (MUCINEX) 600 MG 12 hr tablet Take 2 tablets (1,200 mg total) by mouth 2 (two) times daily. 09/11/17   [name], MD  lansoprazole (PREVACID) 30 MG capsule Take 30 mg by mouth daily after breakfast.    [provider]  levofloxacin (LEVAQUIN) 500 MG tablet Take 1 tablet (500 mg total) by mouth daily. 09/23/17   [name], MD  Multiple Vitamin (MULTIVITAMIN WITH MINERALS) TABS tablet Take 1 tablet by mouth daily.    [provider]  nystatin (MYCOSTATIN/NYSTOP) powder Apply topically 4 (four) times daily.    [provider]  oseltamivir (TAMIFLU) 30 MG capsule Take 1 capsule (30 mg total) by mouth 2 (two) times daily. 09/22/17   [name], MD  polyethylene glycol (MIRALAX / Ethelene Hal) packet Take 17 g by mouth daily. 09/22/17   [name], MD    Family History Family History  Problem Relation Age of Onset  . Colon cancer Mother   . Cerebral aneurysm Father     Social History Social History   Tobacco Use  . Smoking status: Current Every Day Smoker    Packs/day: 0.25    Years: 60.00    Pack years: 15.00    Types: Cigarettes  . Smokeless tobacco: Never Used  Substance Use Topics  . Alcohol use: No  . Drug use: No     Allergies   Penicillins; Amoxicillin; and Sulfa antibiotics   Review of Systems Review of Systems  All other systems reviewed and are negative.    Physical Exam Updated Vital Signs BP (!) 158/87   Pulse 99   Temp (!) 102.2 F (39 C) (Rectal)   Resp (!) 22   SpO2 97%   Physical  Exam  Constitutional: He is oriented to person, place, and time. He appears well-developed and well-nourished. He appears distressed.  Appears uncomfortable.  Grabbing foley and squinting in pain  HENT:  Head: Normocephalic and atraumatic.  Mouth/Throat: Oropharynx is clear and moist.  Eyes: Conjunctivae and EOM are normal. Pupils are equal, round, and reactive to light.  Neck: Normal range of motion. Neck supple.  Cardiovascular: Normal rate, regular rhythm and intact distal pulses.  No murmur heard. Pulmonary/Chest: Effort normal and breath sounds normal. No respiratory distress. He has no wheezes. He has no rales.  Abdominal: Soft. He exhibits no distension. There is tenderness in the suprapubic area. There  is no rebound and no guarding.  Genitourinary:  Genitourinary Comments: Foley cath in place with only minimal redness and no drainage or rash consistent with yeast.  Clear urine in the leg bag  Musculoskeletal: Normal range of motion. He exhibits no edema or tenderness.  Neurological: He is alert and oriented to person, place, and time.  Skin: Skin is warm and dry. No rash noted. No erythema.  Psychiatric: He has a normal mood and affect. His behavior is normal.  Nursing note and vitals reviewed.    ED Treatments / Results  Labs (all labs ordered are listed, but only abnormal results are displayed) Labs Reviewed  CBC WITH DIFFERENTIAL/PLATELET - Abnormal; Notable for the following components:      Result Value   WBC 12.1 (*)    Neutro Abs 10.0 (*)    All other components within normal limits  BASIC METABOLIC PANEL - Abnormal; Notable for the following components:   Creatinine, Ser 1.45 (*)    Calcium 8.6 (*)    GFR calc non Af Amer 44 (*)    GFR calc Af Amer 51 (*)    All other components within normal limits  I-STAT CG4 LACTIC ACID, ED    EKG  EKG Interpretation None       Radiology Dg Chest 2 View  Result Date: 11/21/2017 CLINICAL DATA:  Generalized weakness  EXAM: CHEST - 2 VIEW COMPARISON:  September 18, 2017 FINDINGS: There is no edema or consolidation. Heart size and pulmonary vascularity are normal. There is aortic atherosclerosis. There is degenerative change in the thoracic spine. IMPRESSION: Aortic atherosclerosis.  No edema or consolidation. Aortic Atherosclerosis (ICD10-I70.0). Electronically Signed   By: Bretta Bang III M.D.   On: 11/21/2017 17:48    Procedures Procedures (including critical care time)  Medications Ordered in ED Medications  sodium chloride 0.9 % bolus 1,000 mL (1,000 mLs Intravenous New Bag/Given 11/23/17 1113)  ciprofloxacin (CIPRO) IVPB 400 mg (400 mg Intravenous New Bag/Given 11/23/17 1116)  ondansetron (ZOFRAN-ODT) disintegrating tablet 4 mg (4 mg Oral Given 11/23/17 0929)  HYDROcodone-acetaminophen (NORCO/VICODIN) 5-325 MG per tablet 1 tablet (1 tablet Oral Given 11/23/17 0929)  lidocaine (XYLOCAINE) 2 % jelly 1 application (1 application Urethral Given 11/23/17 0947)  acetaminophen (TYLENOL) tablet 650 mg (650 mg Oral Given 11/23/17 1133)     Initial Impression / Assessment and Plan / ED Course  I have reviewed the triage vital signs and the nursing notes.  Pertinent labs & imaging results that were available during my care of the patient were reviewed by me and considered in my medical decision making (see chart for details).     Elderly male with multiple medical problems with chronic indwelling Foley catheter and recurrent UTIs returning today after being seen 2 days ago for persistent weakness and suprapubic pain.  Patient is also complaining of pain around the Foley catheter incision site.  He is draining urine without signs of retention.  He does have suprapubic discomfort and urine cultures from 2 days ago grow out Pseudomonas which is pansensitive.  Patient is not currently taking an antibiotic but using a yeast cream.  There is no significant evidence of yeast on physical exam.  Low suspicion that this is  related to the patient's AAA.  Patient was given a lidojet without improvement of his pain.  Labs with a reassuring CMP with no change in his creatinine.  CBC with mild leukocytosis of 12 up from 8 2 days ago.  Pt has fever 102.2  here and concern he may be developing sepsis from UTI.  Pt started on IV cipro based on urine culture sensititvities and will admit.  Lactate 2.45.  Pt given IVF.  CRITICAL CARE Performed by: Roylene Heaton Total critical care time: 30 minutes Critical care time was exclusive of separately billable procedures and treating other patients. Critical care was necessary to treat or prevent imminent or life-threatening deterioration. Critical care was time spent personally by me on the following activities: development of treatment plan with patient and/or surrogate as well as nursing, discussions with consultants, evaluation of patient's response to treatment, examination of patient, obtaining history from patient or surrogate, ordering and performing treatments and interventions, ordering and review of laboratory studies, ordering and review of radiographic studies, pulse oximetry and re-evaluation of patient's condition.   Final Clinical Impressions(s) / ED Diagnoses   Final diagnoses:  Urinary tract infection associated with indwelling urethral catheter, subsequent encounter  Sepsis, due to unspecified organism [facility])    ED Discharge Orders    None       Gwyneth Sprout, MD 11/23/17 1137    Gwyneth Sprout, MD 11/23/17 1200    Gwyneth Sprout, MD 12/07/17 2101

## 2017-11-23 NOTE — Progress Notes (Signed)
[age] year male with indwelling foley catheter presented to ED with fever and chills, supra pubic pain and urine cultures from 11/21/2017 growing pseudomonas.  [facility]    Kathlen ModyVijaya Diquan Kassis, MD [phone]

## 2017-11-23 NOTE — ED Triage Notes (Signed)
Pt states he was here Saturday and had catheter changed.  Started having right sided groin pain with since last night.    Pt having right groin itching since Friday.  Pt nauseated.  Pt feels constipated but has been having regular BM's per wife.

## 2017-11-23 NOTE — H&P (Addendum)
History and Physical    David BarlowDavid R Duke ZOX:[id number] DOB: 12/26/1934 DOA: 11/23/2017  PCP: Annita BrodAsenso, Philip, MD  Patient coming from: Home.   I have personally briefly reviewed patient's old medical records in [facility]  Chief Complaint: suprapubic pain since yesterday.   HPI: David Duke is a 83 y.o. male with medical history significant  For indwelling catheter,  AAA, BPH, COPD, hyperlipidemia, anxiety, stroke with dysphagia, recent admission for pneumonia, [facility] for worsening suprapubic pain since his indwelling catheter was changed on Saturday. Pt was seen on Saturday for generalized weakness, and his foley catheter was replaced and UA and urine cultures were sent and pt was discharged. [facility] for persistent  fever and chills today, associated with nausea, suprapubic pain. He also reports some sob and headache but denies any chest pain or palpitations. He has generalized weakness,. oon arrival to ED, he was febrile , tachycardic, tachypnea, with borderline bp, . His urine cultures from 3.16 grew pseudomonas and his labs today showed creatinine of 1.45, AND leukocytosis of 12.1 and lactic acid of 2.45. CXR does not show any pneumonia. He was referred to medical service for admission for management of sepsis from Pseudomonas UTI.    Review of Systems: As per HPI otherwise 10 point review of systems negative.    Past Medical History:  Diagnosis Date  . AAA (abdominal aortic aneurysm) (HCC)   . AKI (acute kidney injury) (HCC)   . Anxiety   . Blind right eye   . BPH (benign prostatic hypertrophy) with urinary retention   . COPD (chronic obstructive pulmonary disease) (HCC)   . Dementia   . Depression   . High cholesterol   . Macular degeneration of both eyes   . Renal disorder   . Shortness of breath dyspnea   . Stroke [facility](HCC)    has residual slurred speech    Past Surgical History:  Procedure Laterality Date  . NO PAST SURGERIES        reports that he has been smoking cigarettes.  He has a 15.00 pack-year smoking history. he has never used smokeless tobacco. He reports that he does not drink alcohol or use drugs.  Allergies  Allergen Reactions  . Penicillins Hives, Diarrhea and Nausea And Vomiting    Has patient had a PCN reaction causing immediate rash, facial/tongue/throat swelling, SOB or lightheadedness with hypotension: Yes Has patient had a PCN reaction causing severe rash involving mucus membranes or skin necrosis: No Has patient had a PCN reaction that required hospitalization: No Has patient had a PCN reaction occurring within the last 10 years: No If all of the above answers are "NO", then may proceed with Cephalosporin use.   [name]. Amoxicillin Hives, Diarrhea and Nausea And Vomiting  . Sulfa Antibiotics Nausea Only    Family History  Problem Relation Age of Onset  . Colon cancer Mother   . Cerebral aneurysm Father    REVIEWED.   Prior to Admission medications   Medication Sig Start Date End Date Taking? Authorizing Provider  acetaminophen (TYLENOL) 500 MG tablet Take 500 mg by mouth every 6 (six) hours as needed for mild pain, moderate pain or headache.   Yes [provider]  albuterol (ACCUNEB) 0.63 MG/3ML nebulizer solution Take 1 ampule by nebulization every 6 (six) hours as needed for wheezing.   Yes [provider]  albuterol (PROVENTIL HFA;VENTOLIN HFA) 108 (90 Base) MCG/ACT inhaler Inhale 1-2 puffs into the lungs  every 6 (six) hours as needed for wheezing or shortness of breath. 12/28/16  Yes Long, Arlyss Repress, MD  busPIRone (BUSPAR) 5 MG tablet Take 5 mg by mouth 2 (two) times daily.   Yes [provider]  diazepam (VALIUM) 5 MG tablet Take 1 tablet (5 mg total) by mouth every 8 (eight) hours as needed for anxiety. Can fill no sooner than October 24, 2016. Patient taking differently: Take 5 mg by mouth 4 (four) times daily.  10/20/16  Yes Saguier, Ramon Dredge, PA-C    finasteride (PROSCAR) 5 MG tablet Take 5 mg by mouth daily. 09/22/16  Yes [provider]  fluticasone (FLONASE) 50 MCG/ACT nasal spray Place 1 spray into both nostrils daily. Patient taking differently: Place 1 spray into both nostrils daily as needed for allergies or rhinitis.  12/11/15  Yes Love, [name], PA-C  guaiFENesin (MUCINEX) 600 MG 12 hr tablet Take 2 tablets (1,200 mg total) by mouth 2 (two) times daily. Patient taking differently: Take 1,200 mg by mouth 2 (two) times daily as needed for cough or to loosen phlegm.  09/11/17  Yes [name], MD  lansoprazole (PREVACID) 30 MG capsule Take 30 mg by mouth daily after breakfast.   Yes [provider]  Multiple Vitamin (MULTIVITAMIN WITH MINERALS) TABS tablet Take 1 tablet by mouth daily.   Yes [provider]  nystatin (MYCOSTATIN/NYSTOP) powder Apply topically 4 (four) times daily.   Yes [provider]  ondansetron (ZOFRAN) 4 MG tablet Take 4 mg by mouth every 6 (six) hours as needed.   Yes [provider]  polyethylene glycol (MIRALAX / GLYCOLAX) packet Take 17 g by mouth daily. 09/22/17  Yes [name], MD    Physical Exam: Vitals:   11/23/17 1400 11/23/17 1423 11/23/17 1530 11/23/17 1706  BP: 117/72  108/64 (!) 145/74  Pulse: 91   94  Resp: 19  (!) 22 20  Temp:  100.3 F (37.9 C)  98.4 F (36.9 C)  TempSrc:  Rectal  Oral  SpO2: 96%   94%    Constitutional: NAD, calm, comfortable Vitals:   11/23/17 1400 11/23/17 1423 11/23/17 1530 11/23/17 1706  BP: 117/72  108/64 (!) 145/74  Pulse: 91   94  Resp: 19  (!) 22 20  Temp:  100.3 F (37.9 C)  98.4 F (36.9 C)  TempSrc:  Rectal  Oral  SpO2: 96%   94%   Eyes: PERRL, lids and conjunctivae normal ENMT: Mucous membranes are moist. Posterior pharynx clear of any exudate or lesions.Normal dentition.  Neck: normal, supple, no masses, no thyromegaly Respiratory: clear to auscultation bilaterally, no wheezing, no  crackles.tachypnea.  Cardiovascular: Regular rate and rhythm, no murmurs / rubs / gallops. No extremity edema. 2+ pedal pulses. No carotid bruits.  Abdomen: no tenderness, no masses palpated. No hepatosplenomegaly. Bowel sounds positive.  Musculoskeletal: no clubbing / cyanosis. No joint deformity upper and lower extremities. Skin: no rashes, lesions, ulcers. No induration Neurologic: CN 2-12 grossly intact. Sensation intact, DTR normal. Strength 5/5 in all 4.  Psychiatric: ANXIOUS.  Alert and oriented x 3. Normal mood.     Labs on Admission: I have personally reviewed following labs and imaging studies  CBC: Recent Labs  Lab 11/21/17 1637 11/23/17 0931  WBC 8.3 12.1*  NEUTROABS 6.1 10.0*  HGB 12.9* 14.2  HCT 38.9* 42.9  MCV 91.3 91.9  PLT 246 202   Basic Metabolic Panel: Recent Labs  Lab 11/21/17 1637 11/23/17 0931  NA 138  140  K 3.7 3.7  CL 104 107  CO2 25 22  GLUCOSE 105* 83  BUN 10 8  CREATININE 1.42* 1.45*  CALCIUM 8.8* 8.6*   GFR: Estimated Creatinine Clearance: 36.4 mL/min (A) (by C-G formula based on SCr of 1.45 mg/dL (H)). Liver Function Tests: Recent Labs  Lab 11/21/17 1637  AST 28  ALT 30  ALKPHOS 73  BILITOT 0.7  PROT 7.0  ALBUMIN 3.1*   No results for input(s): LIPASE, AMYLASE in the last 168 hours. No results for input(s): AMMONIA in the last 168 hours. Coagulation Profile: No results for input(s): INR, PROTIME in the last 168 hours. Cardiac Enzymes: No results for input(s): CKTOTAL, CKMB, CKMBINDEX, TROPONINI in the last 168 hours. BNP (last 3 results) No results for input(s): PROBNP in the last 8760 hours. HbA1C: No results for input(s): HGBA1C in the last 72 hours. CBG: No results for input(s): GLUCAP in the last 168 hours. Lipid Profile: No results for input(s): CHOL, HDL, LDLCALC, TRIG, CHOLHDL, LDLDIRECT in the last 72 hours. Thyroid Function Tests: No results for input(s): TSH, T4TOTAL, FREET4, T3FREE, THYROIDAB in the last 72  hours. Anemia Panel: No results for input(s): VITAMINB12, FOLATE, FERRITIN, TIBC, IRON, RETICCTPCT in the last 72 hours. Urine analysis:    Component Value Date/Time   COLORURINE YELLOW 11/21/2017 1637   APPEARANCEUR CLOUDY (A) 11/21/2017 1637   LABSPEC <1.005 (L) 11/21/2017 1637   PHURINE 6.0 11/21/2017 1637   GLUCOSEU NEGATIVE 11/21/2017 1637   HGBUR LARGE (A) 11/21/2017 1637   BILIRUBINUR NEGATIVE 11/21/2017 1637   KETONESUR NEGATIVE 11/21/2017 1637   PROTEINUR NEGATIVE 11/21/2017 1637   UROBILINOGEN 0.2 03/31/2015 1850   NITRITE POSITIVE (A) 11/21/2017 1637   LEUKOCYTESUR LARGE (A) 11/21/2017 1637    Radiological Exams on Admission: Dg Chest 2 View  Result Date: 11/21/2017 CLINICAL DATA:  Generalized weakness EXAM: CHEST - 2 VIEW COMPARISON:  September 18, 2017 FINDINGS: There is no edema or consolidation. Heart size and pulmonary vascularity are normal. There is aortic atherosclerosis. There is degenerative change in the thoracic spine. IMPRESSION: Aortic atherosclerosis.  No edema or consolidation. Aortic Atherosclerosis (ICD10-I70.0). Electronically Signed   By: Bretta Bang III M.D.   On: 11/21/2017 17:48    EKG: not done.   Assessment/Plan Active Problems:   Sepsis (HCC)   UTI (urinary tract infection)   Sepsis secondary to complicated UTI.  On arrival, pt was febrile, tachycardic, tachypnea, with elevated lactic acid, leukocytosis and pseudomonas in Urine cultures.  HYDRATE, IV antibiotics, IV pain control, repeat lactic acid.  If pain does nt' improve by am, please call urology to replace foley catheter.  Urine culture showing Pseudomonas sensitive to ciprofloxacin.  Blood cultures ordered and are pending.   COPD: No wheezing heard. Resume nebs and albuterol inh as needed.   Stage 2 CKD: Creatinine at baseline.   H/o CVA:  With dysphagia on dysphagia 3 diet.      DVT prophylaxis: Lovenox Code Status: Full code Family Communication: Family at  bedside Disposition Plan: Pending resolution of sepsis Consults called: None Admission status: obs   [name] MD [facility] Pager [phone]  If 7PM-7AM, please contact night-coverage [email or web address] Password [facility]  11/23/2017, 5:27 PM

## 2017-11-24 DIAGNOSIS — A4152 Sepsis due to Pseudomonas: Secondary | ICD-10-CM | POA: Diagnosis not present

## 2017-11-24 DIAGNOSIS — N390 Urinary tract infection, site not specified: Secondary | ICD-10-CM

## 2017-11-24 DIAGNOSIS — T83511D Infection and inflammatory reaction due to indwelling urethral catheter, subsequent encounter: Secondary | ICD-10-CM | POA: Diagnosis not present

## 2017-11-24 LAB — URINE CULTURE

## 2017-11-24 LAB — CBC WITH DIFFERENTIAL/PLATELET
BASOS ABS: 0 10*3/uL (ref 0.0–0.1)
BASOS PCT: 0 %
EOS PCT: 4 %
Eosinophils Absolute: 0.6 10*3/uL (ref 0.0–0.7)
HCT: 38 % — ABNORMAL LOW (ref 39.0–52.0)
Hemoglobin: 12.2 g/dL — ABNORMAL LOW (ref 13.0–17.0)
Lymphocytes Relative: 15 %
Lymphs Abs: 2.1 10*3/uL (ref 0.7–4.0)
MCH: 30.3 pg (ref 26.0–34.0)
MCHC: 32.1 g/dL (ref 30.0–36.0)
MCV: 94.3 fL (ref 78.0–100.0)
MONO ABS: 0.9 10*3/uL (ref 0.1–1.0)
Monocytes Relative: 7 %
Neutro Abs: 10.4 10*3/uL — ABNORMAL HIGH (ref 1.7–7.7)
Neutrophils Relative %: 74 %
PLATELETS: 279 10*3/uL (ref 150–400)
RBC: 4.03 MIL/uL — ABNORMAL LOW (ref 4.22–5.81)
RDW: 13.8 % (ref 11.5–15.5)
WBC: 13.9 10*3/uL — ABNORMAL HIGH (ref 4.0–10.5)

## 2017-11-24 LAB — BASIC METABOLIC PANEL
ANION GAP: 9 (ref 5–15)
BUN: 10 mg/dL (ref 6–20)
CALCIUM: 8.5 mg/dL — AB (ref 8.9–10.3)
CO2: 25 mmol/L (ref 22–32)
CREATININE: 1.48 mg/dL — AB (ref 0.61–1.24)
Chloride: 106 mmol/L (ref 101–111)
GFR, EST AFRICAN AMERICAN: 50 mL/min — AB (ref >60)
GFR, EST NON AFRICAN AMERICAN: 43 mL/min — AB (ref >60)
GLUCOSE: 90 mg/dL (ref 65–99)
Potassium: 4 mmol/L (ref 3.5–5.1)
Sodium: 140 mmol/L (ref 135–145)

## 2017-11-24 MED ORDER — SODIUM CHLORIDE 0.9 % IV SOLN
2.0000 g | INTRAVENOUS | Status: DC
  Administered 2017-11-24 – 2017-11-25 (×2): 2 g via INTRAVENOUS
  Filled 2017-11-24 (×2): qty 2

## 2017-11-24 MED ORDER — FLUCONAZOLE 200 MG PO TABS
200.0000 mg | ORAL_TABLET | Freq: Once | ORAL | Status: AC
  Administered 2017-11-24: 200 mg via ORAL
  Filled 2017-11-24: qty 1

## 2017-11-24 NOTE — Progress Notes (Addendum)
Attending MD note  Patient was seen, examined,treatment plan was discussed with the PA-S.  I have personally reviewed the clinical findings, lab, imaging studies and management of this patient in detail. I agree with the documentation, as recorded by the PA-S  Patient is ASA 83 year old male with medical history significant for chronic indwelling catheter, AAA, BPH, CKD stage II, COPD and CVA with residual dysphasia presented to the emergency department with worsening on suprapubic pain and penile pain.  Patient was seen on 3/16 at the [facility] ED  with the same discomfort and urine catheter was changed, UA and urine cultures were obtained and patient was discharged with no treatment.  Upon ED evaluation on 3/18 patient was found to be febrile tachycardic and with elevated blood pressure.  WBC demonstrated 12.1 with elevated lactic acid.  Urine culture from 3/16 shows Pseudomonas more than 100,000 colonies of enterococcus more than 50,000.  Patient was admitted with sepsis secondary to UTI.   S: Patient seen and examined, report feeling much better than yesterday.  Pain has diminished.   Report itching in the penile area.  No other concern.  Afebrile last night.  On Exam: Blood pressure 108/66, pulse 75, temperature 98.2 F (36.8 C), temperature source Oral, resp. rate 20, height 5\' 8"  (1.727 m), weight 63.4 kg (139 lb 12.3 oz), SpO2 95 %.  Gen. exam: Awake, alert, not in any distress Chest: Good air entry bilaterally, mild expiratory wheezing, no crackles or rhonchi normal respiratory effort  CVS: S1-S2 regular, no murmurs Abdomen: Soft, nontender and nondistended Neurology: Non-focal, pleasantly confused Genitourinary: Foley catheter in place no signs of infection in penile area Skin: No rash or lesions  Impression/plan Sepsis 2/2 Pseudomonas UTI Sepsis physiology improving Patient initially treated with IV ciprofloxacin, given age will change to IV cefepime. Urine culture from  3/16+ for Pseudomonas sensitive to cefepime and Fortaz UA was positive for yeast we will give fluconazole x1 dose 200 mg Continue supportive measures with gentle hydration and pain management. Follow-up blood culture, if patient continues to be stable and afebrile can switch to oral antibiotic and possible DC home in a.m.  COPD No signs of exacerbation Continue nebulizers as needed  Chronic kidney disease stage II Creatinine at baseline Continue to monitor and avoid nephrotoxic agent  History of CVA At baseline  BPH Chronic indwelling catheter, continue finasteride  Rest as below  David Dodrill, MD     Progress Note   David Duke ZOX:[id number] DOB: 1934/11/29 DOA: 11/23/2017 PCP: [name], MD   LOS: 0 days    Brief Narrative:  David Duke is a 83 year old male with a medical history significant for chronic indwelling catheter, AAA, BPH, CKD stage II, COPD, hyperlipidemia, anxiety, and history of past stroke with residual dysphagia who presented to the ED on 11/23/2017 due to suprapubic pain that worsened when his catheter was changed Saturday at the [facility] ED. Upon initial presentation, patient was afebrile at 98.3, but had a Tmax of 102.2, tachycardic 104, RR 22, BP 158/87. BMP was unremarkable except for Cr. 1.45, calcium 8.6, GFR 44. CBC demonstrated WBC 12.1. Elevated lactic acid levels of 2.45. UA and urine cultures were obtained in the [place] ED on Saturday, 11/21/2017, and demonstrated large number of hemoglobin and leukocytes, nitrite positive, few bacteria, positive for yeast. Urine culture was positive for pseudomonas, sensitive to all antibiotics. CXR was negative for signs of edema and consolidation.  Patient was admitted to the hospital on  the working diagnosis of sepsis secondary to pseudomonas UTI.   Assessment/Plan:   Active Problems:   Sepsis (HCC)   UTI (urinary tract infection)  1. Sepsis secondary to pseudomonas UTI: Lactic  acid levels are trending down and are now 1.7. Patient has been afebrile for over 24 hours. WBC trending up at 13.9. Urine cultures from [facility] ED on 11/21/2017 were positive for pseudomonas, sensitive to all antibiotics. Patient admits his penile and suprapubic pain has decreased. Switch IV Ciprofloxacin to IV Cefepime. Continue supportive measures of IVFs and pain management with norco/vicodin and morphine as needed. PT evaluation ordered. Blood cultures pending which have no shown growth in 24 hours. Given positive UA for yeast and patient's constant penile itching, fluconazole x 1 dose has been ordered.  2. COPD: No signs of exacerbation or worsening shortness of breath. Continue albuterol nebulizer q 4 hours as needed for wheezing and shortness of breath.   3. Chronic kidney disease, stage 2: Latest creatinine is 1.4 which appears to be patient's baseline.  4. History of CVA: Patient has residual dysphagia from previous stroke 2 years ago. Continue dysphagia diet 3.   5. Anxiety: Patient shows no signs of agitation. Continue Buspar and valium as needed for anxiety  6. BPH: Continue outpatient medication of finasteride.   7. Normocytic normochromic anemia: Stable. Patient's latest CBC demonstrated Hgb 12.2, HCT 38, MCV 94.3, and MCH 30.3. Most likely etiology from chronic kidney disease. Continue to monitor with CBC. Transfuse if hemoglobin <7.   Family Communication/Anticipated D/C date and plan/Code Status   DVT prophylaxis: Lovenox Code Status: Full Family Communication: No family present at bedside Disposition Plan: to be determined: home vs. SNF   Medical Consultants:      Antimicrobials:  IV cefepime   Subjective:  Patient is awake and pleasantly confused, which may be patient's baseline? Patient admits that his pain has improved with only mild penile pain. Patient denies shortness of breath and chest pain.   Objective:   Vitals:   11/23/17 1706 11/23/17 2147  11/24/17 0229 11/24/17 0417  BP: (!) 145/74 96/64  105/70  Pulse: 94 82  77  Resp: 20 18  20   Temp: 98.4 F (36.9 C) 98.4 F (36.9 C)  98.4 F (36.9 C)  TempSrc: Oral Oral  Oral  SpO2: 94% 96%  95%  Weight:   63.4 kg (139 lb 12.3 oz)   Height:   5\' 8"  (1.727 m)     Intake/Output Summary (Last 24 hours) at 11/24/2017 1300 Last data filed at 11/24/2017 0417 Gross per 24 hour  Intake 937.5 ml  Output 1625 ml  Net -687.5 ml   Filed Weights   11/24/17 0229  Weight: 63.4 kg (139 lb 12.3 oz)     Physical Exam:   Constitutional: NAD, awake, pleasantly confused Eyes: Blind in right eye due to macular degeneration. PERRLA. lids and conjunctivae normal ENMT: Mucous membranes are moist.  Neck: normal, supple, no masses Respiratory: clear to auscultation bilaterally, mild late expiratory wheezing heard most in anterior lung fields, no crackles. Normal respiratory effort. No accessory muscle use.  Cardiovascular: Regular rate and rhythm, normal S1-S2, no murmurs / rubs / gallops. No LE edema.  Abdomen: soft and nondistended. Mild tenderness to palpation in the suprapubic region. No guarding or rebound tenderness. Musculoskeletal: no clubbing / cyanosis. No joint deformity upper and lower extremities. Normal muscle tone.  Skin: Penis shows no signs of infection. Neurologic: non-focal, pleasantly confused     Data Reviewed:  I have independently reviewed following labs and imaging studies:   CBC: Recent Labs  Lab 11-23-2017 1637 11/23/17 0931 11/24/17 0620  WBC 8.3 12.1* 13.9*  NEUTROABS 6.1 10.0* 10.4*  HGB 12.9* 14.2 12.2*  HCT 38.9* 42.9 38.0*  MCV 91.3 91.9 94.3  PLT 246 202 279   Basic Metabolic Panel: Recent Labs  Lab 23-Nov-2017 1637 11/23/17 0931 11/24/17 0620  NA 138 140 140  K 3.7 3.7 4.0  CL 104 107 106  CO2 25 22 25   GLUCOSE 105* 83 90  BUN 10 8 10   CREATININE 1.42* 1.45* 1.48*  CALCIUM 8.8* 8.6* 8.5*   GFR: Estimated Creatinine Clearance: 35.7  mL/min (A) (by C-G formula based on SCr of 1.48 mg/dL (H)). Liver Function Tests: Recent Labs  Lab 11/23/17 1637  AST 28  ALT 30  ALKPHOS 73  BILITOT 0.7  PROT 7.0  ALBUMIN 3.1*   No results for input(s): LIPASE, AMYLASE in the last 168 hours. No results for input(s): AMMONIA in the last 168 hours. Coagulation Profile: No results for input(s): INR, PROTIME in the last 168 hours. Cardiac Enzymes: No results for input(s): CKTOTAL, CKMB, CKMBINDEX, TROPONINI in the last 168 hours. BNP (last 3 results) No results for input(s): PROBNP in the last 8760 hours. HbA1C: No results for input(s): HGBA1C in the last 72 hours. CBG: No results for input(s): GLUCAP in the last 168 hours. Lipid Profile: No results for input(s): CHOL, HDL, LDLCALC, TRIG, CHOLHDL, LDLDIRECT in the last 72 hours. Thyroid Function Tests: No results for input(s): TSH, T4TOTAL, FREET4, T3FREE, THYROIDAB in the last 72 hours. Anemia Panel: No results for input(s): VITAMINB12, FOLATE, FERRITIN, TIBC, IRON, RETICCTPCT in the last 72 hours. Urine analysis:    Component Value Date/Time   COLORURINE YELLOW 23-Nov-2017 1637   APPEARANCEUR CLOUDY (A) 11-23-17 1637   LABSPEC <1.005 (L) November 23, 2017 1637   PHURINE 6.0 11/23/2017 1637   GLUCOSEU NEGATIVE 2017/11/23 1637   HGBUR LARGE (A) Nov 23, 2017 1637   BILIRUBINUR NEGATIVE 2017-11-23 1637   KETONESUR NEGATIVE November 23, 2017 1637   PROTEINUR NEGATIVE 11/23/17 1637   UROBILINOGEN 0.2 03/31/2015 1850   NITRITE POSITIVE (A) Nov 23, 2017 1637   LEUKOCYTESUR LARGE (A) Nov 23, 2017 1637   Sepsis Labs: Invalid input(s): PROCALCITONIN, LACTICIDVEN  Recent Results (from the past 240 hour(s))  Urine culture     Status: Abnormal   Collection Time: 11-23-17  4:37 PM  Result Value Ref Range Status   Specimen Description   Final    URINE, RANDOM Performed at [facility], [address] [place], [place] [address]    Special Requests   Final    NONE Performed at [facility] [address] [place], [place] [address]    Culture (A)  Final    >=100,000 COLONIES/mL PSEUDOMONAS AERUGINOSA 50,000 COLONIES/mL ENTEROCOCCUS FAECALIS    Report Status 11/24/2017 FINAL  Final   Organism ID, Bacteria PSEUDOMONAS AERUGINOSA (A)  Final   Organism ID, Bacteria ENTEROCOCCUS FAECALIS (A)  Final      Susceptibility   Enterococcus faecalis - MIC*    AMPICILLIN <=2 SENSITIVE Sensitive     LEVOFLOXACIN 1 SENSITIVE Sensitive     NITROFURANTOIN <=16 SENSITIVE Sensitive     VANCOMYCIN 2 SENSITIVE Sensitive     * 50,000 COLONIES/mL ENTEROCOCCUS FAECALIS   Pseudomonas aeruginosa - MIC*    CEFTAZIDIME 4 SENSITIVE Sensitive     CIPROFLOXACIN <=0.25 SENSITIVE Sensitive     GENTAMICIN 2 SENSITIVE Sensitive  IMIPENEM 1 SENSITIVE Sensitive     PIP/TAZO 8 SENSITIVE Sensitive     CEFEPIME 2 SENSITIVE Sensitive     * >=100,000 COLONIES/mL PSEUDOMONAS AERUGINOSA  Blood culture (routine x 2)     Status: None (Preliminary result)   Collection Time: 11/23/17 11:55 AM  Result Value Ref Range Status   Specimen Description   Final    BLOOD BLOOD RIGHT HAND Performed at [facility] [place], [address] [place], [name] [address]    Special Requests   Final    BOTTLES DRAWN AEROBIC ONLY Blood Culture results may not be optimal due to an inadequate volume of blood received in culture bottles Performed at [facility] [place], [address] [place], [name] [address]    Culture   Final    NO GROWTH < 24 HOURS Performed at [facility], [address] [place], [name] [address]    Report Status PENDING  Incomplete  Blood culture (routine x 2)     Status: None (Preliminary result)   Collection Time: 11/23/17 11:55 AM  Result Value Ref Range Status   Specimen Description   Final    BLOOD LEFT ANTECUBITAL Performed at [facility] [place], [address] [place], [name] [address]    Special Requests   Final    BOTTLES DRAWN AEROBIC AND  ANAEROBIC Blood Culture adequate volume Performed at [facility] [place], [address] [place], [name] [address]    Culture   Final    NO GROWTH < 24 HOURS Performed at [facility], [address] [place], [name] [address]    Report Status PENDING  Incomplete      Radiology Studies: No results found.    Medication:   . busPIRone  5 mg Oral BID  . enoxaparin (LOVENOX) injection  40 mg Subcutaneous Q24H  . finasteride  5 mg Oral Daily  . multivitamin with minerals  1 tablet Oral Daily    Continuous Infusions: . sodium chloride Stopped (11/24/17 0605)  . ceFEPime (MAXIPIME) IV      Time spent: 25 minutes  Signed, Caprice Renshawaroline Cheek, PA-S [employer] Email: [email or web address]

## 2017-11-24 NOTE — Care Management Obs Status (Signed)
MEDICARE OBSERVATION STATUS NOTIFICATION   Patient Details  Name: David Duke MRN: [id number] Date of Birth: 11/01/1934   Medicare Observation Status Notification Given:  Yes    Bartholome BillCLEMENTS, Kasim Mccorkle H, RN 11/24/2017, 3:30 PM

## 2017-11-25 DIAGNOSIS — A4152 Sepsis due to Pseudomonas: Secondary | ICD-10-CM | POA: Diagnosis not present

## 2017-11-25 DIAGNOSIS — T83511D Infection and inflammatory reaction due to indwelling urethral catheter, subsequent encounter: Secondary | ICD-10-CM | POA: Diagnosis not present

## 2017-11-25 DIAGNOSIS — N390 Urinary tract infection, site not specified: Secondary | ICD-10-CM | POA: Diagnosis not present

## 2017-11-25 LAB — CBC
HCT: 38.6 % — ABNORMAL LOW (ref 39.0–52.0)
Hemoglobin: 12.5 g/dL — ABNORMAL LOW (ref 13.0–17.0)
MCH: 30.3 pg (ref 26.0–34.0)
MCHC: 32.4 g/dL (ref 30.0–36.0)
MCV: 93.5 fL (ref 78.0–100.0)
PLATELETS: 282 10*3/uL (ref 150–400)
RBC: 4.13 MIL/uL — AB (ref 4.22–5.81)
RDW: 13.6 % (ref 11.5–15.5)
WBC: 8.7 10*3/uL (ref 4.0–10.5)

## 2017-11-25 LAB — BASIC METABOLIC PANEL
Anion gap: 10 (ref 5–15)
BUN: 13 mg/dL (ref 6–20)
CHLORIDE: 107 mmol/L (ref 101–111)
CO2: 24 mmol/L (ref 22–32)
CREATININE: 1.47 mg/dL — AB (ref 0.61–1.24)
Calcium: 8.6 mg/dL — ABNORMAL LOW (ref 8.9–10.3)
GFR calc Af Amer: 50 mL/min — ABNORMAL LOW (ref >60)
GFR, EST NON AFRICAN AMERICAN: 43 mL/min — AB (ref >60)
Glucose, Bld: 106 mg/dL — ABNORMAL HIGH (ref 65–99)
POTASSIUM: 3.8 mmol/L (ref 3.5–5.1)
SODIUM: 141 mmol/L (ref 135–145)

## 2017-11-25 MED ORDER — CIPROFLOXACIN HCL 250 MG PO TABS: 250.0000 mg | ORAL_TABLET | Freq: Two times a day (BID) | ORAL | 0 refills | Status: DC

## 2017-11-25 MED FILL — CIPROFLOXACIN HCL 250 MG TA: 250 | 4 days supply | Qty: 8 | Fill #0

## 2017-11-25 NOTE — Progress Notes (Signed)
Discharge instructions and medications discussed with patient and wife.  AVS given to wife.  All questions answered.  

## 2017-11-25 NOTE — Discharge Summary (Addendum)
Physician Discharge Summary  David Duke  ZOX:[id number]  DOB: 1934/11/20  DOA: 11/23/2017 PCP: [name], MD  Admit date: 11/23/2017 Discharge date: 11/25/2017  Admitted From: Home  Disposition: Home   Recommendations for Outpatient Follow-up:  1. Follow up with PCP in 1 week  2. Please obtain BMP/CBC in one week to monitor Hgb and renal function  3. Please follow up on the following pending results: final blood cultures report.  4. Complete Cipro for 4 more days  Discharge Condition: Stable   CODE STATUS: Full  Diet recommendation: Heart Healthy   Brief/Interim Summary: For full details see H&P/Progress note, but in brief, David Duke is a 83 year old male with medical history significant for chronic indwelling catheter, AAA, BPH, CKD stage II, COPD and CVA with residual dysphasia presented to the emergency department with worsening on suprapubic pain and penile pain.  Patient was seen on 3/16 at the [facility] ED  with the same discomfort and urine catheter was changed, UA and urine cultures were obtained and patient was discharged with no treatment.  Upon ED evaluation on 3/18 patient was found to be febrile tachycardic and with elevated blood pressure.  WBC demonstrated 12.1 with elevated lactic acid.  Urine culture from 3/16 shows Pseudomonas more than 100,000 colonies of enterococcus more than 50,000.  Patient was admitted with sepsis secondary to UTI. Patient was treated with IV cipro and cefepime. Patient has clinically improve, afebrile for > 48 hrs, blood cultures no growth so far and currently asymptomatic. Patient deemed stable for discharge to complete abx therapy as outpatient and follow up with PCP.   Subjective: Patient seen and examined, he has no complaints this morning.  Remains afebrile, denies abdominal pain and penile pain.  No acute events overnight.  Discharge Diagnoses/Hospital Course:  Sepsis 2/2 Pseudomonas UTI associated to chronic foley  cath Sepsis physiology resolved Patient was initially treated with IV ciprofloxacin, was switched to IV cefepime received 2 doses during hospital stay Will discharge with oral ciprofloxacin as is of better coverage for Pseudomonas in oral formulation.  Medication discussed with wife advice if any signs of increasing confusion or muscle pain to stop medication and report to primary care physician or emergency department. Urine culture from 3/16+ for Pseudomonas pansensitive  UA was positive for yeast - fluconazole x1 dose 200 mg given Blood cultures no growth  COPD No signs of exacerbation Continue home medications with no changes  Chronic kidney disease stage II Creatinine at baseline Follow-up as an outpatient  History of CVA At baseline  BPH Chronic indwelling catheter, continue finasteride, Foley catheter was exchanged on 3/16 2019. Follow-up with urology    All other chronic medical condition were stable during the hospitalization.  On the day of the discharge the patient's vitals were stable, and no other acute medical condition were reported by patient. the patient was felt safe to be discharge to home.   Discharge Instructions  You were cared for by a hospitalist during your hospital stay. If you have any questions about your discharge medications or the care you received while you were in the hospital after you are discharged, you can call the unit and asked to speak with the hospitalist on call if the hospitalist that took care of you is not available. Once you are discharged, your primary care physician will handle any further medical issues. Please note that NO REFILLS for any discharge medications will be authorized once you are discharged, as it is imperative that you  return to your primary care physician (or establish a relationship with a primary care physician if you do not have one) for your aftercare needs so that they can reassess your need for medications and  monitor your lab values.  Discharge Instructions    Call MD for:  difficulty breathing, headache or visual disturbances   Complete by:  As directed    Call MD for:  extreme fatigue   Complete by:  As directed    Call MD for:  hives   Complete by:  As directed    Call MD for:  persistant dizziness or light-headedness   Complete by:  As directed    Call MD for:  persistant nausea and vomiting   Complete by:  As directed    Call MD for:  redness, tenderness, or signs of infection (pain, swelling, redness, odor or green/yellow discharge around incision site)   Complete by:  As directed    Call MD for:  severe uncontrolled pain   Complete by:  As directed    Call MD for:  temperature >100.4   Complete by:  As directed    Diet - low sodium heart healthy   Complete by:  As directed    Increase activity slowly   Complete by:  As directed      Allergies as of 11/25/2017      Reactions   Penicillins Hives, Diarrhea, Nausea And Vomiting   Has patient had a PCN reaction causing immediate rash, facial/tongue/throat swelling, SOB or lightheadedness with hypotension: Yes Has patient had a PCN reaction causing severe rash involving mucus membranes or skin necrosis: No Has patient had a PCN reaction that required hospitalization: No Has patient had a PCN reaction occurring within the last 10 years: No If all of the above answers are "NO", then may proceed with Cephalosporin use.   Amoxicillin Hives, Diarrhea, Nausea And Vomiting   Sulfa Antibiotics Nausea Only      Medication List    TAKE these medications   acetaminophen 500 MG tablet Commonly known as:  TYLENOL Take 500 mg by mouth every 6 (six) hours as needed for mild pain, moderate pain or headache.   albuterol (2.5 MG/3ML) 0.083% nebulizer solution Commonly known as:  PROVENTIL Take 2.5 mg by nebulization every 8 (eight) hours as needed for wheezing or shortness of breath.   albuterol 108 (90 Base) MCG/ACT inhaler Commonly known  as:  PROVENTIL HFA;VENTOLIN HFA Inhale 1-2 puffs into the lungs every 6 (six) hours as needed for wheezing or shortness of breath.   busPIRone 5 MG tablet Commonly known as:  BUSPAR Take 5 mg by mouth 2 (two) times daily.   ciprofloxacin 250 MG tablet Commonly known as:  CIPRO Take 1 tablet (250 mg total) by mouth 2 (two) times daily for 4 days. Start taking on:  11/26/2017   diazepam 5 MG tablet Commonly known as:  VALIUM Take 5 mg by mouth every 6 (six) hours as needed for anxiety.   finasteride 5 MG tablet Commonly known as:  PROSCAR Take 5 mg by mouth daily.   fluticasone 50 MCG/ACT nasal spray Commonly known as:  FLONASE Place 1 spray into both nostrils daily. What changed:    when to take this  reasons to take this   guaiFENesin 600 MG 12 hr tablet Commonly known as:  MUCINEX Take 2 tablets (1,200 mg total) by mouth 2 (two) times daily. What changed:    when to take this  reasons to take  this   multivitamin with minerals Tabs tablet Take 1 tablet by mouth daily.   nystatin powder Commonly known as:  MYCOSTATIN/NYSTOP Apply topically 4 (four) times daily.   polyethylene glycol packet Commonly known as:  MIRALAX / GLYCOLAX Take 17 g by mouth daily.   PREVACID 30 MG capsule Generic drug:  lansoprazole Take 30 mg by mouth daily after breakfast.   ZOFRAN 4 MG tablet Generic drug:  ondansetron Take 4 mg by mouth every 6 (six) hours as needed.      Follow-up Information    [name], MD. Schedule an appointment as soon as possible for a visit in 1 week(s).   Specialty:  Internal Medicine Why:  Hospital follow-up Contact information: [address] [place] [address] [phone]          Allergies  Allergen Reactions  . Penicillins Hives, Diarrhea and Nausea And Vomiting    Has patient had a PCN reaction causing immediate rash, facial/tongue/throat swelling, SOB or lightheadedness with hypotension: Yes Has patient had a PCN  reaction causing severe rash involving mucus membranes or skin necrosis: No Has patient had a PCN reaction that required hospitalization: No Has patient had a PCN reaction occurring within the last 10 years: No If all of the above answers are "NO", then may proceed with Cephalosporin use.   [name] Amoxicillin Hives, Diarrhea and Nausea And Vomiting  . Sulfa Antibiotics Nausea Only    Consultations:     Procedures/Studies: Dg Chest 2 View  Result Date: 11/21/2017 CLINICAL DATA:  Generalized weakness EXAM: CHEST - 2 VIEW COMPARISON:  September 18, 2017 FINDINGS: There is no edema or consolidation. Heart size and pulmonary vascularity are normal. There is aortic atherosclerosis. There is degenerative change in the thoracic spine. IMPRESSION: Aortic atherosclerosis.  No edema or consolidation. Aortic Atherosclerosis (ICD10-I70.0). Electronically Signed   By: Bretta Bang III M.D.   On: 11/21/2017 17:48   Discharge Exam: Vitals:   11/25/17 0916 11/25/17 0920  BP: 117/67 132/74  Pulse:    Resp:    Temp:    SpO2:     Vitals:   11/24/17 2116 11/25/17 0543 11/25/17 0916 11/25/17 0920  BP: 138/75 136/82 117/67 132/74  Pulse: 79 80    Resp: 18 20    Temp: 98.3 F (36.8 C) 97.6 F (36.4 C)    TempSrc: Oral Oral    SpO2: 99% 94%    Weight:      Height:        General: Pt is alert, awake, not in acute distress Cardiovascular: RRR, S1/S2 +, no rubs, no gallops Respiratory: CTA bilaterally, no wheezing, no rhonchi Abdominal: Soft, NT, ND, bowel sounds + Genitourinary: Foley catheter in place, no signs of infection Extremities: no edema, no cyanosis   The results of significant diagnostics from this hospitalization (including imaging, microbiology, ancillary and laboratory) are listed below for reference.     Microbiology: Recent Results (from the past 240 hour(s))  Urine culture     Status: Abnormal   Collection Time: 11/21/17  4:37 PM  Result Value Ref Range Status   Specimen  Description   Final    URINE, RANDOM Performed at [facility], [name] [place], [place] [address]    Special Requests   Final    NONE Performed at [facility], [address] [place], [place] [address]    Culture (A)  Final    >=100,000 COLONIES/mL PSEUDOMONAS AERUGINOSA 50,000 COLONIES/mL ENTEROCOCCUS  FAECALIS    Report Status 11/24/2017 FINAL  Final   Organism ID, Bacteria PSEUDOMONAS AERUGINOSA (A)  Final   Organism ID, Bacteria ENTEROCOCCUS FAECALIS (A)  Final      Susceptibility   Enterococcus faecalis - MIC*    AMPICILLIN <=2 SENSITIVE Sensitive     LEVOFLOXACIN 1 SENSITIVE Sensitive     NITROFURANTOIN <=16 SENSITIVE Sensitive     VANCOMYCIN 2 SENSITIVE Sensitive     * 50,000 COLONIES/mL ENTEROCOCCUS FAECALIS   Pseudomonas aeruginosa - MIC*    CEFTAZIDIME 4 SENSITIVE Sensitive     CIPROFLOXACIN <=0.25 SENSITIVE Sensitive     GENTAMICIN 2 SENSITIVE Sensitive     IMIPENEM 1 SENSITIVE Sensitive     PIP/TAZO 8 SENSITIVE Sensitive     CEFEPIME 2 SENSITIVE Sensitive     * >=100,000 COLONIES/mL PSEUDOMONAS AERUGINOSA  Blood culture (routine x 2)     Status: None (Preliminary result)   Collection Time: 11/23/17 11:55 AM  Result Value Ref Range Status   Specimen Description   Final    BLOOD BLOOD RIGHT HAND Performed at [facility], [address] [place], [place] [address]    Special Requests   Final    BOTTLES DRAWN AEROBIC ONLY Blood Culture results may not be optimal due to an inadequate volume of blood received in culture bottles Performed at [facility], [address] [place], [place] [address]    Culture   Final    NO GROWTH < 24 HOURS Performed at [facility], [address] [place], [place] [address]    Report Status PENDING  Incomplete  Blood culture (routine x 2)     Status: None (Preliminary result)   Collection Time: 11/23/17 11:55 AM  Result Value Ref Range Status   Specimen Description    Final    BLOOD LEFT ANTECUBITAL Performed at [facility], [address] [place], [place] [address]    Special Requests   Final    BOTTLES DRAWN AEROBIC AND ANAEROBIC Blood Culture adequate volume Performed at [facility], [address] [place], [place] [address]    Culture   Final    NO GROWTH < 24 HOURS Performed at [facility], [address] [place], [place] [address]    Report Status PENDING  Incomplete     Labs: BNP (last 3 results) Recent Labs    06/21/17 1245 09/07/17 0213 09/18/17 1355  BNP 40.6 24.3 56.4   Basic Metabolic Panel: Recent Labs  Lab 11/21/17 1637 11/23/17 0931 11/24/17 0620 11/25/17 0642  NA 138 140 140 141  K 3.7 3.7 4.0 3.8  CL 104 107 106 107  CO2 25 22 25 24   GLUCOSE 105* 83 90 106*  BUN 10 8 10 13   CREATININE 1.42* 1.45* 1.48* 1.47*  CALCIUM 8.8* 8.6* 8.5* 8.6*   Liver Function Tests: Recent Labs  Lab 11/21/17 1637  AST 28  ALT 30  ALKPHOS 73  BILITOT 0.7  PROT 7.0  ALBUMIN 3.1*   No results for input(s): LIPASE, AMYLASE in the last 168 hours. No results for input(s): AMMONIA in the last 168 hours. CBC: Recent Labs  Lab 11/21/17 1637 11/23/17 0931 11/24/17 0620 11/25/17 0642  WBC 8.3 12.1* 13.9* 8.7  NEUTROABS 6.1 10.0* 10.4*  --   HGB 12.9* 14.2 12.2* 12.5*  HCT 38.9* 42.9 38.0* 38.6*  MCV 91.3 91.9 94.3 93.5  PLT 246 202 279 282  Cardiac Enzymes: No results for input(s): CKTOTAL, CKMB, CKMBINDEX, TROPONINI in the last 168 hours. BNP: Invalid input(s): POCBNP CBG: No results for input(s): GLUCAP in the last 168 hours. D-Dimer No results for input(s): DDIMER in the last 72 hours. Hgb A1c No results for input(s): HGBA1C in the last 72 hours. Lipid Profile No results for input(s): CHOL, HDL, LDLCALC, TRIG, CHOLHDL, LDLDIRECT in the last 72 hours. Thyroid function studies No results for input(s): TSH, T4TOTAL, T3FREE, THYROIDAB in the last 72 hours.  Invalid input(s):  FREET3 Anemia work up No results for input(s): VITAMINB12, FOLATE, FERRITIN, TIBC, IRON, RETICCTPCT in the last 72 hours. Urinalysis    Component Value Date/Time   COLORURINE YELLOW 11/21/2017 1637   APPEARANCEUR CLOUDY (A) 11/21/2017 1637   LABSPEC <1.005 (L) 11/21/2017 1637   PHURINE 6.0 11/21/2017 1637   GLUCOSEU NEGATIVE 11/21/2017 1637   HGBUR LARGE (A) 11/21/2017 1637   BILIRUBINUR NEGATIVE 11/21/2017 1637   KETONESUR NEGATIVE 11/21/2017 1637   PROTEINUR NEGATIVE 11/21/2017 1637   UROBILINOGEN 0.2 03/31/2015 1850   NITRITE POSITIVE (A) 11/21/2017 1637   LEUKOCYTESUR LARGE (A) 11/21/2017 1637   Sepsis Labs Invalid input(s): PROCALCITONIN,  WBC,  LACTICIDVEN Microbiology Recent Results (from the past 240 hour(s))  Urine culture     Status: Abnormal   Collection Time: 11/21/17  4:37 PM  Result Value Ref Range Status   Specimen Description   Final    URINE, RANDOM Performed at [facility] [place], [address] [place], [name] [address]    Special Requests   Final    NONE Performed at [facility] [place], [address] [place], [name] [address]    Culture (A)  Final    >=100,000 COLONIES/mL PSEUDOMONAS AERUGINOSA 50,000 COLONIES/mL ENTEROCOCCUS FAECALIS    Report Status 11/24/2017 FINAL  Final   Organism ID, Bacteria PSEUDOMONAS AERUGINOSA (A)  Final   Organism ID, Bacteria ENTEROCOCCUS FAECALIS (A)  Final      Susceptibility   Enterococcus faecalis - MIC*    AMPICILLIN <=2 SENSITIVE Sensitive     LEVOFLOXACIN 1 SENSITIVE Sensitive     NITROFURANTOIN <=16 SENSITIVE Sensitive     VANCOMYCIN 2 SENSITIVE Sensitive     * 50,000 COLONIES/mL ENTEROCOCCUS FAECALIS   Pseudomonas aeruginosa - MIC*    CEFTAZIDIME 4 SENSITIVE Sensitive     CIPROFLOXACIN <=0.25 SENSITIVE Sensitive     GENTAMICIN 2 SENSITIVE Sensitive     IMIPENEM 1 SENSITIVE Sensitive     PIP/TAZO 8 SENSITIVE Sensitive     CEFEPIME 2 SENSITIVE Sensitive     * >=100,000 COLONIES/mL  PSEUDOMONAS AERUGINOSA  Blood culture (routine x 2)     Status: None (Preliminary result)   Collection Time: 11/23/17 11:55 AM  Result Value Ref Range Status   Specimen Description   Final    BLOOD BLOOD RIGHT HAND Performed at [facility] [place], [address] [place], [name] [address]    Special Requests   Final    BOTTLES DRAWN AEROBIC ONLY Blood Culture results may not be optimal due to an inadequate volume of blood received in culture bottles Performed at [facility] [place], [address] [place], [name] [address]    Culture   Final    NO GROWTH < 24 HOURS Performed at [facility], [address] [place], [name] [address]    Report Status PENDING  Incomplete  Blood culture (routine x 2)     Status: None (Preliminary  result)   Collection Time: 11/23/17 11:55 AM  Result Value Ref Range Status   Specimen Description   Final    BLOOD LEFT ANTECUBITAL Performed at [facility], [address] [place], [place] [address]    Special Requests   Final    BOTTLES DRAWN AEROBIC AND ANAEROBIC Blood Culture adequate volume Performed at [facility], [address] [place], [place] [address]    Culture   Final    NO GROWTH < 24 HOURS Performed at [facility], [address], [place], [place] [address]    Report Status PENDING  Incomplete     Time coordinating discharge: 32 minutes  SIGNED:  Latrelle Dodrill, MD  [facility] 11/25/2017, 11:20 AM  Pager please text page via  [email or web address]  Note - This record has been created using [employer]. Chart creation errors have been sought, but may not always have been located. Such creation errors do not reflect on the standard of medical care.

## 2017-11-25 NOTE — Evaluation (Signed)
Physical Therapy One Evaluation Patient Details Name: QUENTAVIOUS RITTENHOUSE MRN: [id number] DOB: 11-Nov-1934 Today's Date: 11/25/2017   History of Present Illness  83 year old male with medical history significant for chronic indwelling catheter, AAA, BPH, CKD stage II, COPD and CVA with residual dysphasia, dementia and admitted for sepsis due to UTI from chronic indwelling foley catheter  Clinical Impression  Patient evaluated by Physical Therapy with no further acute PT needs identified. All education has been completed and the patient has no further questions.  Assisted pt and spouse with changing to leg bag and performing lower body dressing.  Pt ambulated in hallway with RW and performed well.  Pt feels ready for d/c home today. See below for any follow-up Physical Therapy or equipment needs. PT is signing off. Thank you for this referral.     Follow Up Recommendations No PT follow up    Equipment Recommendations  None recommended by PT    Recommendations for Other Services       Precautions / Restrictions Precautions Precautions: Fall      Mobility  Bed Mobility Overal bed mobility: Modified Independent                Transfers Overall transfer level: Needs assistance Equipment used: Rolling walker (2 wheeled) Transfers: Sit to/from Stand Sit to Stand: Supervision;Min guard         General transfer comment: verbal cues for hand placement  Ambulation/Gait Ambulation/Gait assistance: Min guard;Supervision Ambulation Distance (Feet): 400 Feet Assistive device: Rolling walker (2 wheeled) Gait Pattern/deviations: Step-through pattern;Decreased stride length;Trunk flexed     General Gait Details: verbal cues for posture, no LOB or unsteadiness observed with use of RW, pt denies any symptoms  Stairs            Wheelchair Mobility    Modified Rankin (Stroke Patients Only)       Balance Overall balance assessment: Needs assistance           Standing  balance-Leahy Scale: Fair Standing balance comment: attempting to don clothes standing and reaching down, no LOB however encouraged sitting for dressing for safety (hx of stroke and poor vision)               High Level Balance Comments: pt typically uses RW for mobility at home, spouse and pt reports no recent falls             Pertinent Vitals/Pain Pain Assessment: No/denies pain    Home Living Family/patient expects to be discharged to:: Private residence Living Arrangements: Spouse/significant other Available Help at Discharge: Family;Available 24 hours/day Type of Home: House Home Access: Stairs to enter Entrance Stairs-Rails: Right;Left;Can reach both Entrance Stairs-Number of Steps: 3 Home Layout: One level Home Equipment: Emergency planning/management officer - 2 wheels      Prior Function Level of Independence: Independent with assistive device(s)         Comments: retired Psychologist, counselling        Extremity/Trunk Assessment        Lower Extremity Assessment Lower Extremity Assessment: Overall WFL for tasks assessed    Cervical / Trunk Assessment Cervical / Trunk Assessment: Kyphotic  Communication   Communication: No difficulties  Cognition Arousal/Alertness: Awake/alert Behavior During Therapy: WFL for tasks assessed/performed Overall Cognitive Status: History of cognitive impairments - at baseline  General Comments: hx dementia      General Comments      Exercises     Assessment/Plan    PT Assessment Patent does not need any further PT services  PT Problem List         PT Treatment Interventions      PT Goals (Current goals can be found in the Care Plan section)  Acute Rehab PT Goals PT Goal Formulation: All assessment and education complete, DC therapy    Frequency     Barriers to discharge        Co-evaluation               AM-PAC PT "6 Clicks" Daily Activity  Outcome  Measure Difficulty turning over in bed (including adjusting bedclothes, sheets and blankets)?: None Difficulty moving from lying on back to sitting on the side of the bed? : None Difficulty sitting down on and standing up from a chair with arms (e.g., wheelchair, bedside commode, etc,.)?: None Help needed moving to and from a bed to chair (including a wheelchair)?: A Little Help needed walking in hospital room?: A Little Help needed climbing 3-5 steps with a railing? : A Little 6 Click Score: 21    End of Session   Activity Tolerance: Patient tolerated treatment well Patient left: in bed;with call bell/phone within reach;with family/visitor present Nurse Communication: Mobility status PT Visit Diagnosis: Difficulty in walking, not elsewhere classified (R26.2)    Time: [id number] PT Time Calculation (min) (ACUTE ONLY): 23 min   Charges:   PT Evaluation $PT Eval Low Complexity: 1 Low     PT G CodesZenovia Jarred:        Kati Betsy Rosello, PT, DPT 11/25/2017 Pager: [phone]  Maida SaleLEMYRE,KATHrine E 11/25/2017, 1:21 PM

## 2017-11-28 LAB — CULTURE, BLOOD (ROUTINE X 2)
CULTURE: NO GROWTH
CULTURE: NO GROWTH
SPECIAL REQUESTS: ADEQUATE

## 2017-11-30 ENCOUNTER — Inpatient Hospital Stay (HOSPITAL_COMMUNITY)
Admit: 2017-11-30 | Discharge: 2017-12-03 | DRG: 190 | Disposition: A | Discharge status: Home Health | Payer: Medicare Other | Attending: Internal Medicine | Admitting: Internal Medicine

## 2017-11-30 ENCOUNTER — Emergency Department (HOSPITAL_COMMUNITY): Payer: Medicare Other

## 2017-11-30 ENCOUNTER — Other Ambulatory Visit: Payer: Self-pay

## 2017-11-30 ENCOUNTER — Encounter (HOSPITAL_COMMUNITY): Payer: Self-pay | Admitting: Emergency Medicine

## 2017-11-30 DIAGNOSIS — H5461 Unqualified visual loss, right eye, normal vision left eye: Secondary | ICD-10-CM | POA: Diagnosis present

## 2017-11-30 DIAGNOSIS — J441 Chronic obstructive pulmonary disease with (acute) exacerbation: Principal | ICD-10-CM | POA: Diagnosis present

## 2017-11-30 DIAGNOSIS — D72828 Other elevated white blood cell count: Secondary | ICD-10-CM | POA: Diagnosis not present

## 2017-11-30 DIAGNOSIS — Z936 Other artificial openings of urinary tract status: Secondary | ICD-10-CM

## 2017-11-30 DIAGNOSIS — N401 Enlarged prostate with lower urinary tract symptoms: Secondary | ICD-10-CM | POA: Diagnosis not present

## 2017-11-30 DIAGNOSIS — R4701 Aphasia: Secondary | ICD-10-CM | POA: Diagnosis present

## 2017-11-30 DIAGNOSIS — H3530 Unspecified macular degeneration: Secondary | ICD-10-CM | POA: Diagnosis present

## 2017-11-30 DIAGNOSIS — I69391 Dysphagia following cerebral infarction: Secondary | ICD-10-CM

## 2017-11-30 DIAGNOSIS — I679 Cerebrovascular disease, unspecified: Secondary | ICD-10-CM | POA: Diagnosis present

## 2017-11-30 DIAGNOSIS — Z880 Allergy status to penicillin: Secondary | ICD-10-CM

## 2017-11-30 DIAGNOSIS — R338 Other retention of urine: Secondary | ICD-10-CM | POA: Diagnosis present

## 2017-11-30 DIAGNOSIS — F329 Major depressive disorder, single episode, unspecified: Secondary | ICD-10-CM | POA: Diagnosis present

## 2017-11-30 DIAGNOSIS — T380X5A Adverse effect of glucocorticoids and synthetic analogues, initial encounter: Secondary | ICD-10-CM | POA: Diagnosis not present

## 2017-11-30 DIAGNOSIS — N183 Chronic kidney disease, stage 3 (moderate): Secondary | ICD-10-CM | POA: Diagnosis present

## 2017-11-30 DIAGNOSIS — Z720 Tobacco use: Secondary | ICD-10-CM | POA: Diagnosis present

## 2017-11-30 DIAGNOSIS — F17210 Nicotine dependence, cigarettes, uncomplicated: Secondary | ICD-10-CM | POA: Diagnosis present

## 2017-11-30 DIAGNOSIS — R339 Retention of urine, unspecified: Secondary | ICD-10-CM | POA: Diagnosis present

## 2017-11-30 DIAGNOSIS — Z960 Presence of urogenital implants: Secondary | ICD-10-CM | POA: Diagnosis present

## 2017-11-30 DIAGNOSIS — I69398 Other sequelae of cerebral infarction: Secondary | ICD-10-CM

## 2017-11-30 DIAGNOSIS — F0150 Vascular dementia without behavioral disturbance: Secondary | ICD-10-CM | POA: Diagnosis present

## 2017-11-30 DIAGNOSIS — I69323 Fluency disorder following cerebral infarction: Secondary | ICD-10-CM

## 2017-11-30 DIAGNOSIS — I69993 Ataxia following unspecified cerebrovascular disease: Secondary | ICD-10-CM | POA: Diagnosis not present

## 2017-11-30 DIAGNOSIS — I69393 Ataxia following cerebral infarction: Secondary | ICD-10-CM | POA: Diagnosis not present

## 2017-11-30 DIAGNOSIS — Z881 Allergy status to other antibiotic agents status: Secondary | ICD-10-CM

## 2017-11-30 DIAGNOSIS — F419 Anxiety disorder, unspecified: Secondary | ICD-10-CM | POA: Diagnosis not present

## 2017-11-30 DIAGNOSIS — I69320 Aphasia following cerebral infarction: Secondary | ICD-10-CM

## 2017-11-30 DIAGNOSIS — R402413 Glasgow coma scale score 13-15, at hospital admission: Secondary | ICD-10-CM | POA: Diagnosis present

## 2017-11-30 DIAGNOSIS — Z79899 Other long term (current) drug therapy: Secondary | ICD-10-CM

## 2017-11-30 DIAGNOSIS — I69919 Unspecified symptoms and signs involving cognitive functions following unspecified cerebrovascular disease: Secondary | ICD-10-CM | POA: Diagnosis not present

## 2017-11-30 DIAGNOSIS — R296 Repeated falls: Secondary | ICD-10-CM | POA: Diagnosis present

## 2017-11-30 DIAGNOSIS — Z882 Allergy status to sulfonamides status: Secondary | ICD-10-CM

## 2017-11-30 DIAGNOSIS — I69321 Dysphasia following cerebral infarction: Secondary | ICD-10-CM | POA: Diagnosis not present

## 2017-11-30 DIAGNOSIS — J9601 Acute respiratory failure with hypoxia: Secondary | ICD-10-CM | POA: Diagnosis present

## 2017-11-30 DIAGNOSIS — I714 Abdominal aortic aneurysm, without rupture: Secondary | ICD-10-CM | POA: Diagnosis present

## 2017-11-30 DIAGNOSIS — N400 Enlarged prostate without lower urinary tract symptoms: Secondary | ICD-10-CM | POA: Diagnosis present

## 2017-11-30 LAB — I-STAT TROPONIN, ED: TROPONIN I, POC: 0 ng/mL (ref 0.00–0.08)

## 2017-11-30 LAB — CBC WITH DIFFERENTIAL/PLATELET
Basophils Absolute: 0 10*3/uL (ref 0.0–0.1)
Basophils Relative: 0 %
EOS ABS: 0.6 10*3/uL (ref 0.0–0.7)
Eosinophils Relative: 6 %
HEMATOCRIT: 43.4 % (ref 39.0–52.0)
Hemoglobin: 13.9 g/dL (ref 13.0–17.0)
LYMPHS ABS: 2.5 10*3/uL (ref 0.7–4.0)
Lymphocytes Relative: 23 %
MCH: 30.2 pg (ref 26.0–34.0)
MCHC: 32 g/dL (ref 30.0–36.0)
MCV: 94.3 fL (ref 78.0–100.0)
Monocytes Absolute: 0.6 10*3/uL (ref 0.1–1.0)
Monocytes Relative: 6 %
NEUTROS PCT: 65 %
Neutro Abs: 7.2 10*3/uL (ref 1.7–7.7)
Platelets: 327 10*3/uL (ref 150–400)
RBC: 4.6 MIL/uL (ref 4.22–5.81)
RDW: 13.9 % (ref 11.5–15.5)
WBC: 10.9 10*3/uL — AB (ref 4.0–10.5)

## 2017-11-30 LAB — URINALYSIS, ROUTINE W REFLEX MICROSCOPIC
Bilirubin Urine: NEGATIVE
Glucose, UA: NEGATIVE mg/dL
Ketones, ur: NEGATIVE mg/dL
Nitrite: NEGATIVE
PROTEIN: NEGATIVE mg/dL
SQUAMOUS EPITHELIAL / LPF: NONE SEEN
Specific Gravity, Urine: 1.008 (ref 1.005–1.030)
pH: 5 (ref 5.0–8.0)

## 2017-11-30 LAB — COMPREHENSIVE METABOLIC PANEL
ALBUMIN: 3.6 g/dL (ref 3.5–5.0)
ALT: 48 U/L (ref 17–63)
AST: 34 U/L (ref 15–41)
Alkaline Phosphatase: 86 U/L (ref 38–126)
Anion gap: 10 (ref 5–15)
BUN: 11 mg/dL (ref 6–20)
CHLORIDE: 105 mmol/L (ref 101–111)
CO2: 25 mmol/L (ref 22–32)
Calcium: 9 mg/dL (ref 8.9–10.3)
Creatinine, Ser: 1.36 mg/dL — ABNORMAL HIGH (ref 0.61–1.24)
GFR calc Af Amer: 55 mL/min — ABNORMAL LOW (ref >60)
GFR calc non Af Amer: 48 mL/min — ABNORMAL LOW (ref >60)
GLUCOSE: 113 mg/dL — AB (ref 65–99)
POTASSIUM: 3.8 mmol/L (ref 3.5–5.1)
Sodium: 140 mmol/L (ref 135–145)
Total Bilirubin: 0.5 mg/dL (ref 0.3–1.2)
Total Protein: 7.6 g/dL (ref 6.5–8.1)

## 2017-11-30 LAB — BRAIN NATRIURETIC PEPTIDE: B Natriuretic Peptide: 21.9 pg/mL (ref 0.0–100.0)

## 2017-11-30 MED ORDER — POLYETHYLENE GLYCOL 3350 17 G PO PACK
17.0000 g | PACK | Freq: Every day | ORAL | Status: DC
  Administered 2017-12-01 – 2017-12-03 (×3): 17 g via ORAL
  Filled 2017-11-30 (×3): qty 1

## 2017-11-30 MED ORDER — IPRATROPIUM-ALBUTEROL 0.5-2.5 (3) MG/3ML IN SOLN
3.0000 mL | Freq: Once | RESPIRATORY_TRACT | Status: AC
  Administered 2017-11-30: 3 mL via RESPIRATORY_TRACT
  Filled 2017-11-30: qty 3

## 2017-11-30 MED ORDER — SODIUM CHLORIDE 0.9% FLUSH: 3.0000 mL | INTRAVENOUS | Status: DC | PRN

## 2017-11-30 MED ORDER — SODIUM CHLORIDE 0.9% FLUSH
3.0000 mL | Freq: Two times a day (BID) | INTRAVENOUS | Status: DC
  Administered 2017-11-30 – 2017-12-03 (×6): 3 mL via INTRAVENOUS

## 2017-11-30 MED ORDER — FLUTICASONE PROPIONATE 50 MCG/ACT NA SUSP: 1.0000 | Freq: Every day | NASAL | Status: DC | PRN

## 2017-11-30 MED ORDER — ACETAMINOPHEN 500 MG PO TABS
500.0000 mg | ORAL_TABLET | Freq: Four times a day (QID) | ORAL | Status: DC | PRN
  Administered 2017-11-30: 500 mg via ORAL
  Filled 2017-11-30: qty 1

## 2017-11-30 MED ORDER — PANTOPRAZOLE SODIUM 20 MG PO TBEC
20.0000 mg | DELAYED_RELEASE_TABLET | Freq: Every day | ORAL | Status: DC
  Administered 2017-11-30 – 2017-12-03 (×4): 20 mg via ORAL
  Filled 2017-11-30 (×4): qty 1

## 2017-11-30 MED ORDER — METHYLPREDNISOLONE SODIUM SUCC 125 MG IJ SOLR
125.0000 mg | Freq: Once | INTRAMUSCULAR | Status: AC
  Administered 2017-11-30: 125 mg via INTRAVENOUS
  Filled 2017-11-30: qty 2

## 2017-11-30 MED ORDER — GUAIFENESIN ER 600 MG PO TB12: 1200.0000 mg | ORAL_TABLET | Freq: Two times a day (BID) | ORAL | Status: DC | PRN

## 2017-11-30 MED ORDER — BUSPIRONE HCL 10 MG PO TABS
5.0000 mg | ORAL_TABLET | Freq: Two times a day (BID) | ORAL | Status: DC
  Administered 2017-11-30 – 2017-12-01 (×2): 5 mg via ORAL
  Filled 2017-11-30 (×2): qty 0.5
  Filled 2017-11-30: qty 1
  Filled 2017-11-30: qty 0.5
  Filled 2017-11-30: qty 1

## 2017-11-30 MED ORDER — NICOTINE 14 MG/24HR TD PT24
14.0000 mg | MEDICATED_PATCH | Freq: Every day | TRANSDERMAL | Status: DC
  Administered 2017-11-30 – 2017-12-02 (×3): 14 mg via TRANSDERMAL
  Filled 2017-11-30 (×4): qty 1

## 2017-11-30 MED ORDER — DOXYCYCLINE HYCLATE 100 MG PO TABS
100.0000 mg | ORAL_TABLET | Freq: Two times a day (BID) | ORAL | Status: DC
  Administered 2017-11-30 – 2017-12-03 (×6): 100 mg via ORAL
  Filled 2017-11-30 (×6): qty 1

## 2017-11-30 MED ORDER — METHYLPREDNISOLONE SODIUM SUCC 125 MG IJ SOLR
60.0000 mg | Freq: Two times a day (BID) | INTRAMUSCULAR | Status: AC
  Administered 2017-11-30: 60 mg via INTRAVENOUS
  Filled 2017-11-30: qty 2

## 2017-11-30 MED ORDER — HEPARIN SODIUM (PORCINE) 5000 UNIT/ML IJ SOLN
5000.0000 [IU] | Freq: Three times a day (TID) | INTRAMUSCULAR | Status: DC
  Administered 2017-11-30 – 2017-12-01 (×3): 5000 [IU] via SUBCUTANEOUS
  Filled 2017-11-30 (×4): qty 1

## 2017-11-30 MED ORDER — PREDNISONE 20 MG PO TABS
40.0000 mg | ORAL_TABLET | Freq: Every day | ORAL | Status: DC
  Administered 2017-12-01 – 2017-12-03 (×3): 40 mg via ORAL
  Filled 2017-11-30 (×4): qty 2

## 2017-11-30 MED ORDER — ALBUTEROL SULFATE (2.5 MG/3ML) 0.083% IN NEBU
INHALATION_SOLUTION | RESPIRATORY_TRACT | Status: AC
  Filled 2017-11-30: qty 3

## 2017-11-30 MED ORDER — DIAZEPAM 5 MG PO TABS
5.0000 mg | ORAL_TABLET | Freq: Four times a day (QID) | ORAL | Status: DC | PRN
  Administered 2017-11-30 – 2017-12-03 (×9): 5 mg via ORAL
  Filled 2017-11-30 (×10): qty 1

## 2017-11-30 MED ORDER — ONDANSETRON HCL 4 MG PO TABS: 4.0000 mg | ORAL_TABLET | Freq: Four times a day (QID) | ORAL | Status: DC | PRN

## 2017-11-30 MED ORDER — IPRATROPIUM-ALBUTEROL 0.5-2.5 (3) MG/3ML IN SOLN
3.0000 mL | Freq: Four times a day (QID) | RESPIRATORY_TRACT | Status: DC
  Administered 2017-11-30 – 2017-12-01 (×4): 3 mL via RESPIRATORY_TRACT
  Filled 2017-11-30 (×4): qty 3

## 2017-11-30 MED ORDER — SODIUM CHLORIDE 0.9 % IV SOLN: 250.0000 mL | INTRAVENOUS | Status: DC | PRN

## 2017-11-30 MED ORDER — FINASTERIDE 5 MG PO TABS
5.0000 mg | ORAL_TABLET | Freq: Every day | ORAL | Status: DC
  Administered 2017-11-30 – 2017-12-03 (×4): 5 mg via ORAL
  Filled 2017-11-30 (×4): qty 1

## 2017-11-30 MED ORDER — ALBUTEROL SULFATE (2.5 MG/3ML) 0.083% IN NEBU
2.5000 mg | INHALATION_SOLUTION | RESPIRATORY_TRACT | Status: DC | PRN
  Administered 2017-11-30 – 2017-12-02 (×3): 2.5 mg via RESPIRATORY_TRACT
  Filled 2017-11-30 (×2): qty 3

## 2017-11-30 NOTE — ED Notes (Signed)
ED TO INPATIENT HANDOFF REPORT  Name/Age/Gender David Duke 83 y.o. male  Code Status Code Status History    Date Active Date Inactive Code Status Order ID Comments User Context   11/23/2017 1803 11/25/2017 1649 Full Code [id number]  Hosie Poisson, MD Inpatient   09/18/2017 1653 09/22/2017 1952 Full Code [id number]  Phillips Grout, MD Inpatient   09/07/2017 0839 09/11/2017 1920 Full Code [id number]  Barton Dubois, MD Inpatient   03/27/2017 1854 03/28/2017 2048 Full Code [id number]  Shela Leff, MD Inpatient   09/30/2016 0505 10/01/2016 1736 Full Code [id number]  Ivor Costa, MD ED   06/10/2016 0107 06/10/2016 1857 Full Code [id number]  Varney Biles, MD ED   11/28/2015 1630 12/11/2015 1540 Full Code [id number]  Bary Leriche, PA-C Inpatient   11/28/2015 1558 11/28/2015 1630 Full Code [id number]  Bary Leriche, PA-C Inpatient   11/22/2015 1811 11/28/2015 1558 Full Code [id number]  Maisie Fus, MD ED    Advance Directive Documentation     Most Recent Value  Type of Advance Directive  Healthcare Power of Attorney  Pre-existing out of facility DNR order (yellow form or pink MOST form)  -  "MOST" Form in Place?  -      Home/SNF/Other Home  Chief Complaint Shortness of Breath  Level of Care/Admitting Diagnosis ED Disposition    ED Disposition Condition [place]: [facility] [[id number]]  Level of Care: Med-Surg [16]  Diagnosis: COPD with acute exacerbation [facility]) [[id number]]  Admitting Physician: Patrecia Pour [phone]  Attending Physician: Patrecia Pour [phone]  Estimated length of stay: past midnight tomorrow  Certification:: I certify this patient will need inpatient services for at least 2 midnights  PT Class (Do Not Modify): Inpatient [101]  PT Acc Code (Do Not Modify): Private [1]       Medical History Past Medical History:  Diagnosis Date  . AAA (abdominal aortic aneurysm) ([place])   . AKI (acute kidney injury) ([place])   .  Anxiety   . Blind right eye   . BPH (benign prostatic hypertrophy) with urinary retention   . COPD (chronic obstructive pulmonary disease) ([place])   . Dementia   . Depression   . High cholesterol   . Macular degeneration of both eyes   . Renal disorder   . Shortness of breath dyspnea   . Stroke [facility])    has residual slurred speech    Allergies Allergies  Allergen Reactions  . Penicillins Hives, Diarrhea and Nausea And Vomiting    Has patient had a PCN reaction causing immediate rash, facial/tongue/throat swelling, SOB or lightheadedness with hypotension: Yes Has patient had a PCN reaction causing severe rash involving mucus membranes or skin necrosis: No Has patient had a PCN reaction that required hospitalization: No Has patient had a PCN reaction occurring within the last 10 years: No If all of the above answers are "NO", then may proceed with Cephalosporin use.   [name] Amoxicillin Hives, Diarrhea and Nausea And Vomiting  . Sulfa Antibiotics Nausea Only    IV Location/Drains/Wounds Patient Lines/Drains/Airways Status   Active Line/Drains/Airways    Name:   Placement date:   Placement time:   Site:   Days:   Peripheral IV 11/30/17 Right Forearm   11/30/17    0530    Forearm   less than 1   Urethral Catheter K Haskins RN  Coude 16 Fr.   11/21/17    1645    Coude  9          Labs/Imaging Results for orders placed or performed during the hospital encounter of 11/30/17 (from the past 48 hour(s))  CBC with Differential     Status: Abnormal   Collection Time: 11/30/17  5:54 AM  Result Value Ref Range   WBC 10.9 (H) 4.0 - 10.5 K/uL   RBC 4.60 4.22 - 5.81 MIL/uL   Hemoglobin 13.9 13.0 - 17.0 g/dL   HCT 43.4 39.0 - 52.0 %   MCV 94.3 78.0 - 100.0 fL   MCH 30.2 26.0 - 34.0 pg   MCHC 32.0 30.0 - 36.0 g/dL   RDW 13.9 11.5 - 15.5 %   Platelets 327 150 - 400 K/uL   Neutrophils Relative % 65 %   Neutro Abs 7.2 1.7 - 7.7 K/uL   Lymphocytes Relative 23 %   Lymphs Abs 2.5 0.7 - 4.0  K/uL   Monocytes Relative 6 %   Monocytes Absolute 0.6 0.1 - 1.0 K/uL   Eosinophils Relative 6 %   Eosinophils Absolute 0.6 0.0 - 0.7 K/uL   Basophils Relative 0 %   Basophils Absolute 0.0 0.0 - 0.1 K/uL    Comment: Performed at [facility], [place] [address] [place], [place] [address]  Comprehensive metabolic panel     Status: Abnormal   Collection Time: 11/30/17  5:55 AM  Result Value Ref Range   Sodium 140 135 - 145 mmol/L   Potassium 3.8 3.5 - 5.1 mmol/L   Chloride 105 101 - 111 mmol/L   CO2 25 22 - 32 mmol/L   Glucose, Bld 113 (H) 65 - 99 mg/dL   BUN 11 6 - 20 mg/dL   Creatinine, Ser 1.36 (H) 0.61 - 1.24 mg/dL   Calcium 9.0 8.9 - 10.3 mg/dL   Total Protein 7.6 6.5 - 8.1 g/dL   Albumin 3.6 3.5 - 5.0 g/dL   AST 34 15 - 41 U/L   ALT 48 17 - 63 U/L   Alkaline Phosphatase 86 38 - 126 U/L   Total Bilirubin 0.5 0.3 - 1.2 mg/dL   GFR calc non Af Amer 48 (L) >60 mL/min   GFR calc Af Amer 55 (L) >60 mL/min    Comment: (NOTE) The eGFR has been calculated using the CKD EPI equation. This calculation has not been validated in all clinical situations. eGFR's persistently <60 mL/min signify possible Chronic Kidney Disease.    Anion gap 10 5 - 15    Comment: Performed at [facility], [place] [address] [place], [place] [address]  Brain natriuretic peptide     Status: None   Collection Time: 11/30/17  5:55 AM  Result Value Ref Range   B Natriuretic Peptide 21.9 0.0 - 100.0 pg/mL    Comment: Performed at [facility], [place] [address] [place], [place] [address]  I-Stat Troponin, ED (not at [facility])     Status: None   Collection Time: 11/30/17  5:58 AM  Result Value Ref Range   Troponin i, poc 0.00 0.00 - 0.08 ng/mL   Comment 3            Comment: Due to the release kinetics of cTnI, a negative result within the first hours of the onset of symptoms does not rule out myocardial infarction with certainty. If myocardial infarction is  still suspected, repeat the test at appropriate intervals.   Urinalysis, Routine w reflex microscopic     Status: Abnormal   Collection Time:  11/30/17  6:32 AM  Result Value Ref Range   Color, Urine YELLOW YELLOW   APPearance CLEAR CLEAR   Specific Gravity, Urine 1.008 1.005 - 1.030   pH 5.0 5.0 - 8.0   Glucose, UA NEGATIVE NEGATIVE mg/dL   Hgb urine dipstick SMALL (A) NEGATIVE   Bilirubin Urine NEGATIVE NEGATIVE   Ketones, ur NEGATIVE NEGATIVE mg/dL   Protein, ur NEGATIVE NEGATIVE mg/dL   Nitrite NEGATIVE NEGATIVE   Leukocytes, UA TRACE (A) NEGATIVE   RBC / HPF 0-5 0 - 5 RBC/hpf   WBC, UA 0-5 0 - 5 WBC/hpf   Bacteria, UA RARE (A) NONE SEEN   Squamous Epithelial / LPF NONE SEEN NONE SEEN   Mucus PRESENT    Budding Yeast PRESENT     Comment: Performed at [facility], [place] [address] [place], [place] [address]   Dg Chest 2 View  Result Date: 11/30/2017 CLINICAL DATA:  83 year old male with shortness of breath EXAM: CHEST - 2 VIEW COMPARISON:  Chest radiograph dated 11/21/2017 FINDINGS: The lungs are clear. There is no pleural effusion or pneumothorax. The cardiac silhouette is within normal limits. No acute osseous pathology. IMPRESSION: No active cardiopulmonary disease. Electronically Signed   By: Anner Crete M.D.   On: 11/30/2017 06:47    Pending Labs Unresulted Labs (From admission, onward)   Start     Ordered   Signed and Held  CBC  (heparin)  Once,   R    Comments:  Baseline for heparin therapy IF NOT ALREADY DRAWN.  Notify MD if PLT < 100 K.    Signed and Held   Signed and Held  Creatinine, serum  (heparin)  Once,   R    Comments:  Baseline for heparin therapy IF NOT ALREADY DRAWN.    Signed and Held   Signed and Held  Basic metabolic panel  Tomorrow morning,   R     Signed and Held   Signed and Held  CBC  Tomorrow morning,   R     Signed and Held      Vitals/Pain Today's Vitals   11/30/17 0923 11/30/17 0930 11/30/17 1000 11/30/17 1030   BP:  (!) 122/106 139/78 (!) 151/89  Pulse:  87 94 98  Resp:  (!) 27 (!) 21 (!) 21  Temp:      TempSrc:      SpO2: 100% 100% 96% 98%  PainSc:        Isolation Precautions No active isolations  Medications Medications  methylPREDNISolone sodium succinate (SOLU-MEDROL) 125 mg/2 mL injection 125 mg (125 mg Intravenous Given 11/30/17 0625)  ipratropium-albuterol (DUONEB) 0.5-2.5 (3) MG/3ML nebulizer solution 3 mL (3 mLs Nebulization Given 11/30/17 0626)  ipratropium-albuterol (DUONEB) 0.5-2.5 (3) MG/3ML nebulizer solution 3 mL (3 mLs Nebulization Given 11/30/17 0923)    Mobility walks

## 2017-11-30 NOTE — ED Notes (Signed)
Bed: [id number] Expected date:  Expected time:  Means of arrival:  Comments: 2235m SOB

## 2017-11-30 NOTE — H&P (Signed)
History and Physical   Marcell BarlowDavid R Zee ZOX:[id number] DOB: 01/25/1935 DOA: 11/30/2017  Referring MD/NP/PA: Preston FleetingGlick, MD, EDP PCP: Annita BrodAsenso, Philip, MD Outpatient Specialists: None  Patient coming from: Home  Chief Complaint: Cough, dyspnea  HPI: Marcell BarlowDavid R Rhoda is a 83 y.o. male with a history of tobacco use, COPD, stage III CKD, anxiety, BPH and chronic indwelling foley catheter, hemorrhagic CVA and dementia who presented from home due to progressive dyspnea with cough. He reported 2 days of worsening, constant dyspnea associated with persistent cough productive of clear sputum that was only transiently improved with around-the-clock nebulizer treatments at home. He also endorsed diminished energy and wheezing but denies fever, chills, chest pain. No sick contacts, but was recently hospitalized for pseudomonas UTI associated with indwelling foley, discharged on cipro which he completed.   ED Course: EMS administered nebs and on arrival in the ED he was wheezing and tachypneic. Given IV steroids and with only limited improvement after the third continuous albuterol treatment, hospitalists were called for admission. CXR showed no infiltrate.   Review of Systems: Denies fever, chills, weight loss, changes in vision or hearing, sore throat, chest pain, palpitations, abdominal pain, nausea, vomiting, changes in bowel habits, blood in stool, change in bladder habits, myalgias, arthralgias, and rash. +right frontal headache for the past few minutes. All others reviewed and are negative.   Past Medical History:  Diagnosis Date  . AAA (abdominal aortic aneurysm) ([facility])   . AKI (acute kidney injury) ([facility])   . Anxiety   . Blind right eye   . BPH (benign prostatic hypertrophy) with urinary retention   . COPD (chronic obstructive pulmonary disease) ([facility])   . Dementia   . Depression   . High cholesterol   . Macular degeneration of both eyes   . Renal disorder   . Shortness of breath dyspnea   . Stroke  [facility])    has residual slurred speech   Past Surgical History:  Procedure Laterality Date  . NO PAST SURGERIES     - +Smoker.  reports that he has been smoking cigarettes.  He has a 15.00 pack-year smoking history. He has never used smokeless tobacco. He reports that he does not drink alcohol or use drugs. Allergies  Allergen Reactions  . Penicillins Hives, Diarrhea and Nausea And Vomiting    Has patient had a PCN reaction causing immediate rash, facial/tongue/throat swelling, SOB or lightheadedness with hypotension: Yes Has patient had a PCN reaction causing severe rash involving mucus membranes or skin necrosis: No Has patient had a PCN reaction that required hospitalization: No Has patient had a PCN reaction occurring within the last 10 years: No If all of the above answers are "NO", then may proceed with Cephalosporin use.   [name]. Amoxicillin Hives, Diarrhea and Nausea And Vomiting  . Sulfa Antibiotics Nausea Only   Family History  Problem Relation Age of Onset  . Colon cancer Mother   . Cerebral aneurysm Father    - Family history otherwise reviewed and not pertinent.  Prior to Admission medications   Medication Sig Start Date End Date Taking? Authorizing Provider  acetaminophen (TYLENOL) 500 MG tablet Take 500 mg by mouth every 6 (six) hours as needed for mild pain, moderate pain or headache.   Yes [provider]  albuterol (PROVENTIL HFA;VENTOLIN HFA) 108 (90 Base) MCG/ACT inhaler Inhale 1-2 puffs into the lungs every 6 (six) hours as needed for wheezing or shortness of breath. 12/28/16  Yes Long, Arlyss RepressJoshua G, MD  albuterol (PROVENTIL) (  2.5 MG/3ML) 0.083% nebulizer solution Take 2.5 mg by nebulization every 8 (eight) hours as needed for wheezing or shortness of breath.   Yes [provider]  busPIRone (BUSPAR) 5 MG tablet Take 5 mg by mouth 2 (two) times daily.   Yes [provider]  ciprofloxacin (CIPRO) 250 MG tablet Take 1 tablet (250 mg total) by mouth 2  (two) times daily for 4 days. 11/26/17 11/30/17 Yes Lenox Ponds, MD  diazepam (VALIUM) 5 MG tablet Take 5 mg by mouth every 6 (six) hours as needed for anxiety.   Yes [provider]  finasteride (PROSCAR) 5 MG tablet Take 5 mg by mouth daily. 09/22/16  Yes [provider]  fluticasone (FLONASE) 50 MCG/ACT nasal spray Place 1 spray into both nostrils daily. Patient taking differently: Place 1 spray into both nostrils daily as needed for allergies or rhinitis.  12/11/15  Yes Love, [name], PA-C  guaiFENesin (MUCINEX) 600 MG 12 hr tablet Take 2 tablets (1,200 mg total) by mouth 2 (two) times daily. Patient taking differently: Take 1,200 mg by mouth 2 (two) times daily as needed for cough or to loosen phlegm.  09/11/17  Yes [name], MD  lansoprazole (PREVACID) 30 MG capsule Take 30 mg by mouth daily after breakfast.   Yes [provider]  Multiple Vitamin (MULTIVITAMIN WITH MINERALS) TABS tablet Take 1 tablet by mouth daily.   Yes [provider]  nystatin (MYCOSTATIN/NYSTOP) powder Apply topically 4 (four) times daily.   Yes [provider]  ondansetron (ZOFRAN) 4 MG tablet Take 4 mg by mouth every 6 (six) hours as needed.   Yes [provider]  polyethylene glycol (MIRALAX / GLYCOLAX) packet Take 17 g by mouth daily. 09/22/17  Yes [name], MD    Physical Exam: Vitals:   11/30/17 0830 11/30/17 0900 11/30/17 0923 11/30/17 0930  BP: 130/82 (!) 156/85  (!) 122/106  Pulse: 85 85  87  Resp: (!) 22 (!) 33  (!) 27  Temp:      TempSrc:      SpO2: 95% 98% 100% 100%   Constitutional: Frazzled 83yo male in no distress, calm demeanor Eyes: Lids and conjunctivae normal, PERRL ENMT: Mucous membranes are moist. Posterior pharynx clear of any exudate or lesions. Poor dentition.  Neck: normal, supple, no masses, no thyromegaly Respiratory: Non-labored breathing without accessory muscle use. Diffuse expiratory wheezing bilaterally  with fair air exchange, no crackles. Speaks only single sentences due to dyspnea. Cardiovascular: Regular rate and rhythm, no murmurs, rubs, or gallops. No carotid bruits. No JVD. No LE edema. 2+ pedal pulses. Abdomen: Normoactive bowel sounds. No tenderness, non-distended, and no masses palpated. No hepatosplenomegaly. GU: + indwelling catheter Musculoskeletal: No clubbing / cyanosis. No joint deformity upper and lower extremities. Good ROM, no contractures. Normal muscle tone.  Skin: Warm, dry. No rashes, wounds, or ulcers.   Neurologic: Chronic right eye blindness, otherwise CN II-XII grossly intact. Gait not assessed. Mild aphasia and ataxia. No focal deficits in motor strength or sensation in all extremities Psychiatric: Alert and oriented x3. Very frequent word blocking. Normal judgment and insight. Mood anxious with congruent affect.   Labs on Admission: I have personally reviewed following labs and imaging studies  CBC: Recent Labs  Lab 11/24/17 0620 11/25/17 0642 11/30/17 0554  WBC 13.9* 8.7 10.9*  NEUTROABS 10.4*  --  7.2  HGB 12.2* 12.5* 13.9  HCT 38.0* 38.6* 43.4  MCV 94.3 93.5 94.3  PLT 279 282 327  Basic Metabolic Panel: Recent Labs  Lab 11/24/17 0620 11/25/17 0642 11/30/17 0555  NA 140 141 140  K 4.0 3.8 3.8  CL 106 107 105  CO2 25 24 25   GLUCOSE 90 106* 113*  BUN 10 13 11   CREATININE 1.48* 1.47* 1.36*  CALCIUM 8.5* 8.6* 9.0   GFR: Estimated Creatinine Clearance: 38.8 mL/min (A) (by C-G formula based on SCr of 1.36 mg/dL (H)). Liver Function Tests: Recent Labs  Lab 11/30/17 0555  AST 34  ALT 48  ALKPHOS 86  BILITOT 0.5  PROT 7.6  ALBUMIN 3.6   No results for input(s): LIPASE, AMYLASE in the last 168 hours. No results for input(s): AMMONIA in the last 168 hours. Coagulation Profile: No results for input(s): INR, PROTIME in the last 168 hours. Cardiac Enzymes: No results for input(s): CKTOTAL, CKMB, CKMBINDEX, TROPONINI in the last 168  hours. BNP (last 3 results) No results for input(s): PROBNP in the last 8760 hours. HbA1C: No results for input(s): HGBA1C in the last 72 hours. CBG: No results for input(s): GLUCAP in the last 168 hours. Lipid Profile: No results for input(s): CHOL, HDL, LDLCALC, TRIG, CHOLHDL, LDLDIRECT in the last 72 hours. Thyroid Function Tests: No results for input(s): TSH, T4TOTAL, FREET4, T3FREE, THYROIDAB in the last 72 hours. Anemia Panel: No results for input(s): VITAMINB12, FOLATE, FERRITIN, TIBC, IRON, RETICCTPCT in the last 72 hours. Urine analysis:    Component Value Date/Time   COLORURINE YELLOW 11/30/2017 0632   APPEARANCEUR CLEAR 11/30/2017 0632   LABSPEC 1.008 11/30/2017 0632   PHURINE 5.0 11/30/2017 0632   GLUCOSEU NEGATIVE 11/30/2017 0632   HGBUR SMALL (A) 11/30/2017 0632   BILIRUBINUR NEGATIVE 11/30/2017 0632   KETONESUR NEGATIVE 11/30/2017 0632   PROTEINUR NEGATIVE 11/30/2017 0632   UROBILINOGEN 0.2 03/31/2015 1850   NITRITE NEGATIVE 11/30/2017 0632   LEUKOCYTESUR TRACE (A) 11/30/2017 1610    Recent Results (from the past 240 hour(s))  Urine culture     Status: Abnormal   Collection Time: 11/21/17  4:37 PM  Result Value Ref Range Status   Specimen Description   Final    URINE, RANDOM Performed at [facility], [address] [place], [place] [address]    Special Requests   Final    NONE Performed at [facility], [address] [place], [place] [address]    Culture (A)  Final    >=100,000 COLONIES/mL PSEUDOMONAS AERUGINOSA 50,000 COLONIES/mL ENTEROCOCCUS FAECALIS    Report Status 11/24/2017 FINAL  Final   Organism ID, Bacteria PSEUDOMONAS AERUGINOSA (A)  Final   Organism ID, Bacteria ENTEROCOCCUS FAECALIS (A)  Final      Susceptibility   Enterococcus faecalis - MIC*    AMPICILLIN <=2 SENSITIVE Sensitive     LEVOFLOXACIN 1 SENSITIVE Sensitive     NITROFURANTOIN <=16 SENSITIVE Sensitive     VANCOMYCIN 2 SENSITIVE Sensitive     *  50,000 COLONIES/mL ENTEROCOCCUS FAECALIS   Pseudomonas aeruginosa - MIC*    CEFTAZIDIME 4 SENSITIVE Sensitive     CIPROFLOXACIN <=0.25 SENSITIVE Sensitive     GENTAMICIN 2 SENSITIVE Sensitive     IMIPENEM 1 SENSITIVE Sensitive     PIP/TAZO 8 SENSITIVE Sensitive     CEFEPIME 2 SENSITIVE Sensitive     * >=100,000 COLONIES/mL PSEUDOMONAS AERUGINOSA  Blood culture (routine x 2)     Status: None   Collection Time: 11/23/17 11:55 AM  Result Value Ref Range Status   Specimen Description   Final  BLOOD BLOOD RIGHT HAND Performed at [facility], [address] [place], [place] [address]    Special Requests   Final    BOTTLES DRAWN AEROBIC ONLY Blood Culture results may not be optimal due to an inadequate volume of blood received in culture bottles Performed at [facility], [address] [place], [place] [address]    Culture   Final    NO GROWTH 5 DAYS Performed at [facility], [address] [place], [place] [address]    Report Status 11/28/2017 FINAL  Final  Blood culture (routine x 2)     Status: None   Collection Time: 11/23/17 11:55 AM  Result Value Ref Range Status   Specimen Description   Final    BLOOD LEFT ANTECUBITAL Performed at [facility], [address] [place], [place] [address]    Special Requests   Final    BOTTLES DRAWN AEROBIC AND ANAEROBIC Blood Culture adequate volume Performed at [facility], [address] [place], [place] [address]    Culture   Final    NO GROWTH 5 DAYS Performed at [facility], [address], [place], [place] [address]    Report Status 11/28/2017 FINAL  Final     Radiological Exams on Admission: Dg Chest 2 View  Result Date: 11/30/2017 CLINICAL DATA:  83 year old male with shortness of breath EXAM: CHEST - 2 VIEW COMPARISON:  Chest radiograph dated 11/21/2017 FINDINGS: The lungs are clear. There is no pleural effusion or pneumothorax. The cardiac silhouette is within  normal limits. No acute osseous pathology. IMPRESSION: No active cardiopulmonary disease. Electronically Signed   By: Elgie Collard M.D.   On: 11/30/2017 06:47    EKG: Independently reviewed. NSR with LAD and RSR' in anterior precordial leads suggestive of RVH. No ischemic changes.  Assessment/Plan Active Problems:   * No active hospital problems. *   Acute hypoxic respiratory failure: Due to COPD exacerbation. No documented hypoxia, though RN stated pt did drop to < 90% with good wave form, so continuing 2L O2. Also has dyspnea limiting speech to single sentences. No infiltrate on CXR. BNP wnl, troponin negative. - Bronchodilators scheduled and prn - Continue IV steroids today - Doxycycline for mod-severe COPD exacerbation.   Tobacco use:  - Cessation counseling provided. Nicotine patch if needed.   Dysphagia: Due to CVA.  - Dysphagia 3 diet, moderate aspiration risk per SLP during previous hospitalization.  Cerebrovascular disease, s/p hemorrhagic CVA, with residual ataxia and vascular dementia: No focal deficits.  - CM, CSW consulted, pt is significant burden to caregiver and currently receiving home health services - PT/OT.   Anxiety:  -Continue home BMW:UXLKGM, valium 5mg  prn  CKD stage III: At baseline, slightly below previous discharge. - Renally dose medications  - Avoid nephrotoxins.  BPH with chronic urinary retention and chronic indwelling foley catheter:  - Continue finasteride, foley  History of Pseudomonas UTI: Treated with cipro. No further treatment indicated.   Yeast colonization of chronic foley: No treatment indicated.   DVT prophylaxis: Heparin  Code Status: Full  Family Communication: None at bedside Disposition Plan: Uncertain Consults called: None  Admission status: Inpatient    Hazeline Junker, MD [facility] Pager [phone]  If 7PM-7AM, please contact night-coverage [email or web address] Password [facility] 11/30/2017, 10:38 AM

## 2017-11-30 NOTE — ED Provider Notes (Signed)
[facility]-EMERGENCY DEPT Provider Note   CSN: [id number] Arrival date & time: 11/30/17  7425     History   Chief Complaint Chief Complaint  Patient presents with  . Shortness of Breath    HPI David Duke is a 83 y.o. male.  The history is provided by the patient.  He has history of COPD, chronic kidney disease, dementia, depression, stroke and comes in with difficulty breathing and cough which started yesterday.  Cough is productive of small amount of clear sputum.  He denies fever chills or sweats.  He denies nausea or vomiting.  Denies arthralgias or myalgias.  He has had no known sick contacts.  He had received albuterol and ipratropium in the ambulance coming in.  He had not received any methylprednisolone.  Past Medical History:  Diagnosis Date  . AAA (abdominal aortic aneurysm) (HCC)   . AKI (acute kidney injury) (HCC)   . Anxiety   . Blind right eye   . BPH (benign prostatic hypertrophy) with urinary retention   . COPD (chronic obstructive pulmonary disease) (HCC)   . Dementia   . Depression   . High cholesterol   . Macular degeneration of both eyes   . Renal disorder   . Shortness of breath dyspnea   . Stroke [facility])    has residual slurred speech    Patient Active Problem List   Diagnosis Date Noted  . UTI (urinary tract infection) 11/23/2017  . HCAP (healthcare-associated pneumonia) 09/18/2017  . Sepsis (HCC)   . COPD exacerbation (HCC) 09/07/2017  . Acute respiratory failure with hypoxia (HCC) 09/07/2017  . Rhinitis, chronic 09/07/2017  . Malnutrition of moderate degree 09/07/2017  . Elevated troponin 03/27/2017  . Diplopia 03/27/2017  . Bilateral leg edema 03/27/2017  . Enterocolitis 09/30/2016  . HLD (hyperlipidemia) 09/30/2016  . Tobacco abuse 09/30/2016  . Nausea vomiting and diarrhea 09/30/2016  . History of stroke in prior 3 months 01/22/2016  . Chronic tension-type headache, not intractable   . Acute frontal  sinusitis   . 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 Gait disturbance, post-stroke   . Ataxia, late effect of cerebrovascular disease   . BPH (benign prostatic hyperplasia)   . Urinary retention   . Adjustment disorder with mixed anxiety and depressed mood   . Chronic obstructive pulmonary disease (HCC)   . Cephalalgia   . Acute kidney injury superimposed on chronic kidney disease (HCC)   . Macular degeneration   . Prediabetes   . Hypokalemia   . Absolute anemia   . Aphasia   . Acute encephalopathy 11/25/2015  . Hyponatremia 11/25/2015  . Acute kidney injury (HCC) 11/25/2015  . Depression   . Anxiety   . COPD (chronic obstructive pulmonary disease) (HCC)   . ICH (intracerebral hemorrhage) (HCC) 11/22/2015    Past Surgical History:  Procedure Laterality Date  . NO PAST SURGERIES          Home Medications    Prior to Admission medications   Medication Sig Start Date End Date Taking? Authorizing Provider  acetaminophen (TYLENOL) 500 MG tablet Take 500 mg by mouth every 6 (six) hours as needed for mild pain, moderate pain or headache.    [provider]  albuterol (PROVENTIL HFA;VENTOLIN HFA) 108 (90 Base) MCG/ACT inhaler Inhale 1-2 puffs into the lungs every 6 (six) hours as needed for wheezing or shortness of breath. 12/28/16   Long, Arlyss Repress, MD  albuterol (PROVENTIL) (2.5 MG/3ML) 0.083%  nebulizer solution Take 2.5 mg by nebulization every 8 (eight) hours as needed for wheezing or shortness of breath.    [provider]  busPIRone (BUSPAR) 5 MG tablet Take 5 mg by mouth 2 (two) times daily.    [provider]  ciprofloxacin (CIPRO) 250 MG tablet Take 1 tablet (250 mg total) by mouth 2 (two) times daily for 4 days. 11/26/17 11/30/17  Lenox Ponds, MD  diazepam (VALIUM) 5 MG tablet Take 5 mg by mouth every 6 (six) hours as needed for anxiety.    [provider]  finasteride (PROSCAR) 5 MG tablet Take 5 mg by  mouth daily. 09/22/16   [provider]  fluticasone (FLONASE) 50 MCG/ACT nasal spray Place 1 spray into both nostrils daily. Patient taking differently: Place 1 spray into both nostrils daily as needed for allergies or rhinitis.  12/11/15   Love, [name], PA-C  guaiFENesin (MUCINEX) 600 MG 12 hr tablet Take 2 tablets (1,200 mg total) by mouth 2 (two) times daily. Patient taking differently: Take 1,200 mg by mouth 2 (two) times daily as needed for cough or to loosen phlegm.  09/11/17   [name], MD  lansoprazole (PREVACID) 30 MG capsule Take 30 mg by mouth daily after breakfast.    [provider]  Multiple Vitamin (MULTIVITAMIN WITH MINERALS) TABS tablet Take 1 tablet by mouth daily.    [provider]  nystatin (MYCOSTATIN/NYSTOP) powder Apply topically 4 (four) times daily.    [provider]  ondansetron (ZOFRAN) 4 MG tablet Take 4 mg by mouth every 6 (six) hours as needed.    [provider]  polyethylene glycol (MIRALAX / GLYCOLAX) packet Take 17 g by mouth daily. 09/22/17   [name], MD    Family History Family History  Problem Relation Age of Onset  . Colon cancer Mother   . Cerebral aneurysm Father     Social History Social History   Tobacco Use  . Smoking status: Current Every Day Smoker    Packs/day: 0.25    Years: 60.00    Pack years: 15.00    Types: Cigarettes  . Smokeless tobacco: Never Used  Substance Use Topics  . Alcohol use: No  . Drug use: No     Allergies   Penicillins; Amoxicillin; and Sulfa antibiotics   Review of Systems Review of Systems  All other systems reviewed and are negative.    Physical Exam Updated Vital Signs BP (!) 143/77 (BP Location: Left Arm)   Pulse 90   Temp (!) 97.3 F (36.3 C) (Oral)   Resp (!) 23   SpO2 97%   Physical Exam  Nursing note and vitals reviewed.  83 year old male, resting comfortably and in no acute distress. Vital signs are significant for  tachypnea and mildly elevated systolic blood pressure. Oxygen saturation is 97%, which is normal. Head is normocephalic and atraumatic. PERRLA, EOMI. Oropharynx is clear. Neck is nontender and supple without adenopathy or JVD. Back is nontender and there is no CVA tenderness. Lungs have scattered expiratory wheezes.  He is in mild respiratory distress and that he cannot complete a sentence without stopping for breath, but he is not using accessory muscles of respiration.  There are no rales or rhonchi Chest is nontender. Heart has regular rate and rhythm without murmur. Abdomen is soft, flat, nontender without masses or hepatosplenomegaly and peristalsis is normoactive. Extremities have no cyanosis or edema, full range of motion is present. Skin is  warm and dry without rash. Neurologic: Mental status is normal, cranial nerves are intact, there are no motor or sensory deficits.  ED Treatments / Results  Labs (all labs ordered are listed, but only abnormal results are displayed) Labs Reviewed  CBC WITH DIFFERENTIAL/PLATELET - Abnormal; Notable for the following components:      Result Value   WBC 10.9 (*)    All other components within normal limits  COMPREHENSIVE METABOLIC PANEL - Abnormal; Notable for the following components:   Glucose, Bld 113 (*)    Creatinine, Ser 1.36 (*)    GFR calc non Af Amer 48 (*)    GFR calc Af Amer 55 (*)    All other components within normal limits  URINALYSIS, ROUTINE W REFLEX MICROSCOPIC - Abnormal; Notable for the following components:   Hgb urine dipstick SMALL (*)    Leukocytes, UA TRACE (*)    Bacteria, UA RARE (*)    All other components within normal limits  BRAIN NATRIURETIC PEPTIDE  I-STAT TROPONIN, ED    EKG EKG Interpretation  Date/Time:  Monday November 30 2017 05:43:23 EDT Ventricular Rate:  91 PR Interval:    QRS Duration: 105 QT Interval:  368 QTC Calculation: 453 R Axis:   -100 Text Interpretation:  Sinus rhythm LAD, consider  left anterior fascicular block Probable right ventricular hypertrophy When compared with ECG of 11/21/2017, No significant change was found Confirmed by Dione BoozeGlick, Josef ([id number]) on 11/30/2017 5:59:02 AM   Radiology Dg Chest 2 View  Result Date: 11/30/2017 CLINICAL DATA:  83 year old male with shortness of breath EXAM: CHEST - 2 VIEW COMPARISON:  Chest radiograph dated 11/21/2017 FINDINGS: The lungs are clear. There is no pleural effusion or pneumothorax. The cardiac silhouette is within normal limits. No acute osseous pathology. IMPRESSION: No active cardiopulmonary disease. Electronically Signed   By: Elgie CollardArash  Radparvar M.D.   On: 11/30/2017 06:47    Procedures Procedures   Medications Ordered in ED Medications  ipratropium-albuterol (DUONEB) 0.5-2.5 (3) MG/3ML nebulizer solution 3 mL (has no administration in time range)  methylPREDNISolone sodium succinate (SOLU-MEDROL) 125 mg/2 mL injection 125 mg (125 mg Intravenous Given 11/30/17 0625)  ipratropium-albuterol (DUONEB) 0.5-2.5 (3) MG/3ML nebulizer solution 3 mL (3 mLs Nebulization Given 11/30/17 [id number])     Initial Impression / Assessment and Plan / ED Course  I have reviewed the triage vital signs and the nursing notes.  Pertinent labs & imaging results that were available during my care of the patient were reviewed by me and considered in my medical decision making (see chart for details).  COPD exacerbation, possible pneumonia.  Old records are reviewed, and he was discharged on March 20 after being hospitalized for UTI.  He was given methylprednisolone and given additional albuterol with ipratropium.  Following this, he continues to have wheezing and tachypnea and unable to speak in complete sentences without stopping for breath.  Chest x-ray showed no evidence of pneumonia, and laboratory workup showed renal insufficiency which is unchanged from baseline.  Mild leukocytosis is present of uncertain significance.  He will be given additional  albuterol with ipratropium, but I feel his respiratory status is too precarious to have him go home at this point.  Case is discussed with Dr. Jarvis NewcomerGrunz of [facility] hospitalist, who agrees to admit the patient..  Final Clinical Impressions(s) / ED Diagnoses   Final diagnoses:  COPD exacerbation [facility](HCC)    ED Discharge Orders    None       Dione BoozeGlick, Jamarea, MD 11/30/17 [phone]

## 2017-11-30 NOTE — Care Management Note (Signed)
Case Management Note  Patient Details  Name: Marcell BarlowDavid R Coles MRN: [id number] Date of Birth: 03/31/1935  CM [facility]  Contacted Clydie BraunKaren who advised pt has HH [facility].  Expected Discharge Date:  (unknown)               Expected Discharge Plan:  Home w Home Health Services  Discharge planning Services  CM Consult  Post Acute Care Choice:  Home Health Choice offered to:  Patient, Spouse  HH Arranged:  [facility] Agency:  [facility]  Status of Service:  In process, will continue to follow  Rica KoyanagiKritzer, Randi Poullard N, RN 11/30/2017, 11:15 AM

## 2017-11-30 NOTE — ED Triage Notes (Signed)
Pt arriving from home with shortness of breath with productive cough x 3 days. Clear sputum. 15mg  of albuterol and 1mg  Atrovent given prior to arrival. Lung sounds have improved per EMS report. Some faint expiratory wheezing present.

## 2017-12-01 LAB — CBC
HCT: 40.5 % (ref 39.0–52.0)
HEMOGLOBIN: 13 g/dL (ref 13.0–17.0)
MCH: 30 pg (ref 26.0–34.0)
MCHC: 32.1 g/dL (ref 30.0–36.0)
MCV: 93.3 fL (ref 78.0–100.0)
Platelets: 339 10*3/uL (ref 150–400)
RBC: 4.34 MIL/uL (ref 4.22–5.81)
RDW: 13.5 % (ref 11.5–15.5)
WBC: 24.4 10*3/uL — AB (ref 4.0–10.5)

## 2017-12-01 LAB — BASIC METABOLIC PANEL
ANION GAP: 9 (ref 5–15)
BUN: 22 mg/dL — ABNORMAL HIGH (ref 6–20)
CALCIUM: 9.2 mg/dL (ref 8.9–10.3)
CO2: 24 mmol/L (ref 22–32)
Chloride: 104 mmol/L (ref 101–111)
Creatinine, Ser: 1.27 mg/dL — ABNORMAL HIGH (ref 0.61–1.24)
GFR calc non Af Amer: 52 mL/min — ABNORMAL LOW (ref >60)
GFR, EST AFRICAN AMERICAN: 60 mL/min — AB (ref >60)
Glucose, Bld: 133 mg/dL — ABNORMAL HIGH (ref 65–99)
Potassium: 4.5 mmol/L (ref 3.5–5.1)
Sodium: 137 mmol/L (ref 135–145)

## 2017-12-01 MED ORDER — BUSPIRONE HCL 5 MG PO TABS
5.0000 mg | ORAL_TABLET | Freq: Two times a day (BID) | ORAL | Status: DC
  Administered 2017-12-01 – 2017-12-03 (×4): 5 mg via ORAL
  Filled 2017-12-01 (×4): qty 1

## 2017-12-01 MED ORDER — DIAZEPAM 5 MG PO TABS
5.0000 mg | ORAL_TABLET | Freq: Once | ORAL | Status: AC
  Administered 2017-12-01: 5 mg via ORAL
  Filled 2017-12-01: qty 1

## 2017-12-01 MED ORDER — IPRATROPIUM-ALBUTEROL 0.5-2.5 (3) MG/3ML IN SOLN
3.0000 mL | Freq: Three times a day (TID) | RESPIRATORY_TRACT | Status: DC
  Administered 2017-12-01 – 2017-12-03 (×5): 3 mL via RESPIRATORY_TRACT
  Filled 2017-12-01 (×5): qty 3

## 2017-12-01 NOTE — Clinical Social Work Note (Signed)
Clinical Social Work Assessment  Patient Details  Name: David Duke MRN: [id number] Date of Birth: 1934-10-23  Date of referral:  12/01/17               Reason for consult:  ("may need placement")                Permission sought to share information with:  Family Supports Permission granted to share information::  Yes, Verbal Permission Granted  Name::     wife Therapist, nutritional::  [place]  Relationship::     Contact Information:     Housing/Transportation Living arrangements for the past 2 months:  [place] of Information:  Patient, Partner Patient Interpreter Needed:  None Criminal Activity/Legal Involvement Pertinent to Current Situation/Hospitalization:  No - Comment as needed Significant Relationships:  Spouse Lives with:  Spouse Do you feel safe going back to the place where you live?  Yes Need for family participation in patient care:  Yes (Comment)(pt with dementia, wife assists heavily with decisions)  Care giving concerns:  Pt lives at home with his wife. She reports he has advancing dementia and his cognition has declined in past 2 years since having a stroke in March 2017. She states he walks independently with a walker and sometimes without one. He is able to prepare his own food and shower/complete ADLs independenytly "on good days, but on bad days he is more confused about what is going on and needs instruction and me to do things for him." Has COPD and has a chronic foley catheter. [facility] and wife is very pleased with care.  Pt also has impaired vision in left eye due to macular degeneration and wife reports this is concerning to him. Was recently admitted for UTI and discharged on Cipro, pt fell in his driveway last week (EMS was called, no injuries apparent) and this is very concerning to pt. He states he feels the Cipro caused him excess weakness.  Wife reports she has been discussing pt's care needs with  providers "since the stroke 2 years ago. He went to [facility] afterward and did well for a while but now his sundowners can be really bad and I know the time is coming when I need to be finding memory care facility for him."   Social Worker assessment / plan:  CSW consulted to assess potential placement needs. Met with pt and his wife at bedside. Both very pleasant and welcoming of CSW involvement. Pt was alert/oriented x 4 and good historian although he required redirection as he wandered off topic frequently, and he was very circumstantial. Wife reports "this is normal for him, he gets very bogged down in the details." Wife reports at night pt is generally much more confused. Gathered description of care needs above and assessed wife's/patient's readiness to pursue additional care at home and/or facility placement. Pt advised he is agreeable to hiring caregivers but wants to remain at home for the time being. Wife shared she has discussed this with [facility] CSWs at past admissions and has ALF memory care list.  CSW educated pt/wife on ALF placement process. Provided wife with contact information for elder care consulting agencies in area if interested in obtaining help with process. She was agreeable.  Wife states that she wants to hire caregiver for a "few hours a day and see how that goes, but in the meantime I am going to be doing my research on memory care facilities  so I can be prepared for what I know he will need soon." Spent some time processing pt's and wife's emotions surrounding potential future placement as well as validating wife's caregiver stress. Both expressed thanks for resources/time and for staff's care of pt. Pt/wife report that after pt worked with PT today, they are interested in adding HHPT to their home health services.  Plan: Pt planning to return home with wife at DC. Has [place] RN and would like to add PT/OT if appropriate. Wife looking to hire caregiver to assist  her. Wife also researching memory care ALFs for future.   Employment status:  Retired Engineer, maintenance (IT)) PT Recommendations:  Home with [place] / Referral to community resources:  (assisted living memory care information)  Patient/Family's Response to care:  Both pt and wife very appreciative  Patient/Family's Understanding of and Emotional Response to Diagnosis, Current Treatment, and Prognosis:  Pt and wife demonstrate good understanding of his treatment and care needs. Pt does become confused about details but generally very well-informed. Emotionally he states he is afraid "of falling again, I know I need to be extra cautious now but I don't want to be afraid of going outside." CSW and wife processed this with him and encouraged him to keep pursuing activities of interest but being careful to include his supports and not attempt too much alone. Wife expressed feeling guilt at preparing to potentially have pt admitted to a memory care in the future and CSW helped validate and support her emotionally.   Emotional Assessment Appearance:  Appears stated age Attitude/Demeanor/Rapport:  Engaged(circumstantial) Affect (typically observed):  Calm, Stable Orientation:  Oriented to Self, Oriented to Place, Oriented to  Time, Oriented to Situation Alcohol / Substance use:  Not Applicable Psych involvement (Current and /or in the community):  No (Comment)  Discharge Needs  Concerns to be addressed:  Coping/Stress Concerns, Adjustment to Illness, Discharge Planning Concerns Readmission within the last 30 days:  Yes Current discharge risk:  Cognitively Impaired Barriers to Discharge:  Continued Medical Work up   [employer], LCSW 12/01/2017, 4:11 PM  [phone]

## 2017-12-01 NOTE — Progress Notes (Addendum)
[facility] PROGRESS NOTE  David Duke ZOX:[id number] DOB: 06/23/1935 DOA: 11/30/2017  PCP: [name], MD  Brief History/Interval Summary: 83 year old Caucasian male with a past medical history of tobacco abuse, COPD, CKD stage III, BPH, chronic indwelling Foley, history of stroke and dementia presented with worsening shortness of breath and cough.  Was thought to have COPD exacerbation and was hospitalized for further management.  Recently hospitalized with Pseudomonas UTI and was discharged on ciprofloxacin which he has completed.  Reason for Visit: Acute COPD exacerbation  Consultants: None  Procedures: None  Antibiotics: Doxycycline  Subjective/Interval History: Patient poor historian.  Very distracted.  Rambling speech.  States that he is feeling slightly better this morning.  ROS: Denies any nausea vomiting  Objective:  Vital Signs  Vitals:   12/01/17 0428 12/01/17 0514 12/01/17 0756 12/01/17 1202  BP: 128/71     Pulse: 98     Resp: 20     Temp: 97.7 F (36.5 C)     TempSrc: Oral     SpO2: 94% 98% 96% 96%  Weight:      Height:        Intake/Output Summary (Last 24 hours) at 12/01/2017 1214 Last data filed at 12/01/2017 1054 Gross per 24 hour  Intake 240 ml  Output 1750 ml  Net -1510 ml   Filed Weights   11/30/17 1413  Weight: 62.4 kg (137 lb 9.1 oz)    General appearance: alert, cooperative, appears stated age, distracted and no distress Head: Normocephalic, without obvious abnormality, atraumatic Resp: Coarse breath sounds bilaterally with scattered wheezes.  Mildly tachypneic at rest.  No definite crackles. Cardio: regular rate and rhythm, S1, S2 normal, no murmur, click, rub or gallop GI: soft, non-tender; bowel sounds normal; no masses,  no organomegaly Extremities: extremities normal, atraumatic, no cyanosis or edema Pulses: 2+ and symmetric Neurologic: No focal deficits.  Seems to be distracted.  Lab Results:  Data Reviewed: I  have personally reviewed following labs and imaging studies  CBC: Recent Labs  Lab 11/25/17 0642 11/30/17 0554 12/01/17 0425  WBC 8.7 10.9* 24.4*  NEUTROABS  --  7.2  --   HGB 12.5* 13.9 13.0  HCT 38.6* 43.4 40.5  MCV 93.5 94.3 93.3  PLT 282 327 339    Basic Metabolic Panel: Recent Labs  Lab 11/25/17 0642 11/30/17 0555 12/01/17 0425  NA 141 140 137  K 3.8 3.8 4.5  CL 107 105 104  CO2 24 25 24   GLUCOSE 106* 113* 133*  BUN 13 11 22*  CREATININE 1.47* 1.36* 1.27*  CALCIUM 8.6* 9.0 9.2    GFR: Estimated Creatinine Clearance: 40.9 mL/min (A) (by C-G formula based on SCr of 1.27 mg/dL (H)).  Liver Function Tests: Recent Labs  Lab 11/30/17 0555  AST 34  ALT 48  ALKPHOS 86  BILITOT 0.5  PROT 7.6  ALBUMIN 3.6      Recent Results (from the past 240 hour(s))  Urine culture     Status: Abnormal   Collection Time: 11/21/17  4:37 PM  Result Value Ref Range Status   Specimen Description   Final    URINE, RANDOM Performed at [facility], [address] [place], [place] [address]    Special Requests   Final    NONE Performed at [facility], [address] [place], [place] [address]    Culture (A)  Final    >=100,000 COLONIES/mL PSEUDOMONAS AERUGINOSA 50,000 COLONIES/mL ENTEROCOCCUS FAECALIS  Report Status 11/24/2017 FINAL  Final   Organism ID, Bacteria PSEUDOMONAS AERUGINOSA (A)  Final   Organism ID, Bacteria ENTEROCOCCUS FAECALIS (A)  Final      Susceptibility   Enterococcus faecalis - MIC*    AMPICILLIN <=2 SENSITIVE Sensitive     LEVOFLOXACIN 1 SENSITIVE Sensitive     NITROFURANTOIN <=16 SENSITIVE Sensitive     VANCOMYCIN 2 SENSITIVE Sensitive     * 50,000 COLONIES/mL ENTEROCOCCUS FAECALIS   Pseudomonas aeruginosa - MIC*    CEFTAZIDIME 4 SENSITIVE Sensitive     CIPROFLOXACIN <=0.25 SENSITIVE Sensitive     GENTAMICIN 2 SENSITIVE Sensitive     IMIPENEM 1 SENSITIVE Sensitive     PIP/TAZO 8 SENSITIVE Sensitive     CEFEPIME 2  SENSITIVE Sensitive     * >=100,000 COLONIES/mL PSEUDOMONAS AERUGINOSA  Blood culture (routine x 2)     Status: None   Collection Time: 11/23/17 11:55 AM  Result Value Ref Range Status   Specimen Description   Final    BLOOD BLOOD RIGHT HAND Performed at [facility], [address] [place], [place] [address]    Special Requests   Final    BOTTLES DRAWN AEROBIC ONLY Blood Culture results may not be optimal due to an inadequate volume of blood received in culture bottles Performed at [facility], [address] [place], [place] [address]    Culture   Final    NO GROWTH 5 DAYS Performed at [facility], [address] [place], [place] [address]    Report Status 11/28/2017 FINAL  Final  Blood culture (routine x 2)     Status: None   Collection Time: 11/23/17 11:55 AM  Result Value Ref Range Status   Specimen Description   Final    BLOOD LEFT ANTECUBITAL Performed at [facility], [address] [place], [place] [address]    Special Requests   Final    BOTTLES DRAWN AEROBIC AND ANAEROBIC Blood Culture adequate volume Performed at [facility], [address] [place], [place] [address]    Culture   Final    NO GROWTH 5 DAYS Performed at [facility], [address], [place], [place] [address]    Report Status 11/28/2017 FINAL  Final      Radiology Studies: Dg Chest 2 View  Result Date: 11/30/2017 CLINICAL DATA:  83 year old male with shortness of breath EXAM: CHEST - 2 VIEW COMPARISON:  Chest radiograph dated 11/21/2017 FINDINGS: The lungs are clear. There is no pleural effusion or pneumothorax. The cardiac silhouette is within normal limits. No acute osseous pathology. IMPRESSION: No active cardiopulmonary disease. Electronically Signed   By: Elgie Collard M.D.   On: 11/30/2017 06:47     Medications:  Scheduled: . busPIRone  5 mg Oral BID  . doxycycline  100 mg Oral Q12H  . finasteride  5 mg Oral Daily  .  heparin  5,000 Units Subcutaneous Q8H  . ipratropium-albuterol  3 mL Nebulization QID  . nicotine  14 mg Transdermal Daily  . pantoprazole  20 mg Oral Daily  . polyethylene glycol  17 g Oral Daily  . predniSONE  40 mg Oral Q breakfast  . sodium chloride flush  3 mL Intravenous Q12H   Continuous: . 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  Continue oxygen for now.  Wean as tolerated.  Treat COPD exacerbation.  Acute COPD exacerbation Seems to be slightly better.  Continue nebulizer treatment steroids doxycycline.  Oxygen to maintain sats 89% or above.  Significant increase in WBCs most likely due to steroids.  Recheck tomorrow.  Tobacco abuse Cessation counseling provided.  Dysphagia This is secondary to history of stroke.  He is on a dysphagia diet.  Moderate aspiration risk per speech therapy during previous hospitalization.  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  No focal deficits.  Apparently patient is a significant burden to caregiver.  Currently receiving home health services.  PT and OT evaluation.  History of anxiety disorder Continue with home medications.  Chronic kidney disease stage III Appears to be at baseline.  Monitor urine output.  History of BPH with chronic indwelling Foley catheter Stable.  Recently treated for Pseudomonas UTI.  He has completed course of ciprofloxacin.  Continue home medications.  Yeast colonization of chronic Foley No  treatment indicated.  DVT Prophylaxis: Subcutaneous heparin    Code Status: Full code Family Communication: No family at bedside Disposition Plan: Management as outlined above.  Hopefully home in 1-2 days.    LOS: 1 day   Osvaldo ShipperGokul Cuinn Westerhold  [facility] Pager [phone] 12/01/2017, 12:14 PM  If 7PM-7AM, please contact night-coverage at [email or web address], password [email or web address]

## 2017-12-01 NOTE — Progress Notes (Signed)
OT Cancellation Note  Patient Details Name: David BarlowDavid R Duke MRN: [id number] DOB: 01/04/1935   Cancelled Treatment:    Reason Eval/Treat Not Completed: Other (comment)  SW is in with pt. Will check back later today or tomorrow.  Doretta Remmert 12/01/2017, 2:30 PM  Marica OtterMaryellen Tamar Miano, OTR/L [phone] 12/01/2017

## 2017-12-01 NOTE — Progress Notes (Signed)
[facility]  Patient Status: Active (receiving services up to time of hospitalization)  [facility] is providing the following services: RN  If patient discharges after hours, please call [phone].   Avie EchevariaKaren Nussbaum 12/01/2017, 9:36 AM

## 2017-12-01 NOTE — Evaluation (Signed)
Physical Therapy Evaluation Patient Details Name: David BarlowDavid R Dost MRN: [id number] DOB: 05/03/1935 Today's Date: 12/01/2017   History of Present Illness  David BarlowDavid R Duke is a 83 y.o. male with a history of tobacco use, COPD, stage III CKD, anxiety, BPH and chronic indwelling foley catheter, hemorrhagic CVA and dementia who presented from home due to progressive dyspnea with cough. He reported 2 days of worsening, constant dyspnea associated with persistent cough productive of clear sputum that was only transiently improved with around-the-clock nebulizer treatments at home. He also endorsed diminished energy and wheezing but denies fever, chills, chest pain. No sick contacts, but was recently hospitalized for pseudomonas UTI associated with indwelling foley, discharged on cipro which he completed.     Clinical Impression  Pt admitted with above diagnosis. Pt currently with functional limitations due to the deficits listed below (see PT Problem List). Pt will benefit from skilled PT in this venue to increase their independence and safety with mobility to allow discharge to the venue listed below.  Once discharged, pt will benefit from HHPT when able to return home due to difficulty with transportation for Outpatient PT appointments.     Follow Up Recommendations Home health PT    Equipment Recommendations  None recommended by PT    Recommendations for Other Services       Precautions / Restrictions Precautions Precautions: Fall Restrictions Weight Bearing Restrictions: No      Mobility  Bed Mobility Overal bed mobility: Modified Independent                Transfers Overall transfer level: Needs assistance Equipment used: None Transfers: Sit to/from Stand Sit to Stand: Min guard            Ambulation/Gait Ambulation/Gait assistance: Min guard;Min assist Ambulation Distance (Feet): 450 Feet Assistive device: None Gait Pattern/deviations: Step-through  pattern;Decreased stride length;Decreased step length - right;Decreased step length - left;Narrow base of support   Gait velocity interpretation: Below normal speed for age/gender General Gait Details: 1 minor LOB during ambulation requiring minimal assistance from PT to recover.  Stairs            Wheelchair Mobility    Modified Rankin (Stroke Patients Only)       Balance Overall balance assessment: Needs assistance Sitting-balance support: No upper extremity supported Sitting balance-Leahy Scale: Good     Standing balance support: No upper extremity supported Standing balance-Leahy Scale: Fair                               Pertinent Vitals/Pain Pain Assessment: No/denies pain    Home Living Family/patient expects to be discharged to:: Private residence Living Arrangements: Spouse/significant other Available Help at Discharge: Family(wife; unclear amount of RN care at home from [facility].) Type of Home: House Home Access: Stairs to enter Entrance Stairs-Rails: Right;Left;Can reach both Secretary/administratorntrance Stairs-Number of Steps: 3 Home Layout: One level Home Equipment: Emergency planning/management officerhower seat;Walker - 2 wheels      Prior Function Level of Independence: Independent(denies using assistive devices at home.)         Comments: retired Psychologist, counsellingpoliceman     Hand Dominance        Extremity/Trunk Assessment        Lower Extremity Assessment Lower Extremity Assessment: Overall WFL for tasks assessed    Cervical / Trunk Assessment Cervical / Trunk Assessment: Kyphotic  Communication   Communication: No difficulties  Cognition Arousal/Alertness: Awake/alert Behavior During Therapy:  WFL for tasks assessed/performed Overall Cognitive Status: No family/caregiver present to determine baseline cognitive functioning                                 General Comments: hx dementia      General Comments      Exercises     Assessment/Plan    PT  Assessment Patient needs continued PT services  PT Problem List Decreased strength;Decreased mobility;Decreased safety awareness;Decreased coordination;Decreased activity tolerance;Decreased balance       PT Treatment Interventions Therapeutic activities;Therapeutic exercise;Gait training;Patient/family education;Balance training;Neuromuscular re-education;Functional mobility training    PT Goals (Current goals can be found in the Care Plan section)  Acute Rehab PT Goals Patient Stated Goal: return home. Time For Goal Achievement: 12/08/17 Potential to Achieve Goals: Good    Frequency Min 2X/week   Barriers to discharge        Co-evaluation               AM-PAC PT "6 Clicks" Daily Activity  Outcome Measure Difficulty turning over in bed (including adjusting bedclothes, sheets and blankets)?: None Difficulty moving from lying on back to sitting on the side of the bed? : None Difficulty sitting down on and standing up from a chair with arms (e.g., wheelchair, bedside commode, etc,.)?: None Help needed moving to and from a bed to chair (including a wheelchair)?: A Little Help needed walking in hospital room?: A Little Help needed climbing 3-5 steps with a railing? : A Little 6 Click Score: 21    End of Session Equipment Utilized During Treatment: Gait belt Activity Tolerance: Patient tolerated treatment well Patient left: in chair;with call bell/phone within reach;with chair alarm set Nurse Communication: Mobility status PT Visit Diagnosis: Unsteadiness on feet (R26.81);Other abnormalities of gait and mobility (R26.89);Muscle weakness (generalized) (M62.81)    Time: 4540-9811 PT Time Calculation (min) (ACUTE ONLY): 34 min   Charges:   PT Evaluation $PT Eval Moderate Complexity: 1 Mod PT Treatments $Gait Training: 8-22 mins   PT G Codes:        Makar Slatter D. Hartnett-Rands, MS, PT [facility] PT [facility] Manuel Garcia [phone] 12/01/2017, 11:47 AM

## 2017-12-02 DIAGNOSIS — J441 Chronic obstructive pulmonary disease with (acute) exacerbation: Principal | ICD-10-CM

## 2017-12-02 DIAGNOSIS — I69993 Ataxia following unspecified cerebrovascular disease: Secondary | ICD-10-CM

## 2017-12-02 DIAGNOSIS — F419 Anxiety disorder, unspecified: Secondary | ICD-10-CM

## 2017-12-02 LAB — BASIC METABOLIC PANEL
ANION GAP: 10 (ref 5–15)
BUN: 30 mg/dL — ABNORMAL HIGH (ref 6–20)
CHLORIDE: 101 mmol/L (ref 101–111)
CO2: 29 mmol/L (ref 22–32)
CREATININE: 1.5 mg/dL — AB (ref 0.61–1.24)
Calcium: 9.4 mg/dL (ref 8.9–10.3)
GFR calc non Af Amer: 42 mL/min — ABNORMAL LOW (ref >60)
GFR, EST AFRICAN AMERICAN: 49 mL/min — AB (ref >60)
Glucose, Bld: 93 mg/dL (ref 65–99)
Potassium: 3.9 mmol/L (ref 3.5–5.1)
SODIUM: 140 mmol/L (ref 135–145)

## 2017-12-02 LAB — CBC
HEMATOCRIT: 41.8 % (ref 39.0–52.0)
HEMOGLOBIN: 13.5 g/dL (ref 13.0–17.0)
MCH: 30.2 pg (ref 26.0–34.0)
MCHC: 32.3 g/dL (ref 30.0–36.0)
MCV: 93.5 fL (ref 78.0–100.0)
Platelets: 333 10*3/uL (ref 150–400)
RBC: 4.47 MIL/uL (ref 4.22–5.81)
RDW: 13.8 % (ref 11.5–15.5)
WBC: 20.5 10*3/uL — AB (ref 4.0–10.5)

## 2017-12-02 NOTE — Evaluation (Signed)
Occupational Therapy Evaluation Patient Details Name: David Duke MRN: [id number] DOB: 1934/10/17 Today's Date: 12/02/2017    History of Present Illness David Duke is a 83 y.o. male with a history of tobacco use, COPD, stage III CKD, anxiety, BPH and chronic indwelling foley catheter, hemorrhagic CVA and dementia who presented from home due to progressive dyspnea with cough. He reported 2 days of worsening, constant dyspnea associated with persistent cough productive of clear sputum that was only transiently improved with around-the-clock nebulizer treatments at home. He also endorsed diminished energy and wheezing but denies fever, chills, chest pain. No sick contacts, but was recently hospitalized for pseudomonas UTI associated with indwelling foley, discharged on cipro which he completed.    Clinical Impression   Pt seen for initial evaluation. He self-limited what he would do this am.  Pt normally independent with adls; however, he sometimes needs cues/assist from wife depending on cognition.  Min guard given for safety when up.  No further OT needs at this time    Follow Up Recommendations  Supervision/Assistance - 24 hour    Equipment Recommendations  None recommended by OT    Recommendations for Other Services       Precautions / Restrictions Precautions Precautions: Fall Restrictions Weight Bearing Restrictions: No      Mobility Bed Mobility Overal bed mobility: Modified Independent             General bed mobility comments: HOB raised  Transfers   Equipment used: None   Sit to Stand: Min guard         General transfer comment: for safety    Balance                                           ADL either performed or assessed with clinical judgement   ADL Overall ADL's : Needs assistance/impaired                         Toilet Transfer: Min guard;Ambulation(simulated)             General ADL Comments: Pt  did not want to participate in ADL this am.  Had already brushed his teeth. Walked towards bathroom with min guard, but he did not want to sit on commode as he didn't need to use it. Wife encouraged pt to participate.  Pt did request to walk in hall while he was up:  min guard for safety.  Used 02 and pt's VSS.   ADLs based on clinical judgment: set up/min guard.   Per chart, variability exists on performance depending on cognition     Vision         Perception     Praxis      Pertinent Vitals/Pain       Hand Dominance     Extremity/Trunk Assessment Upper Extremity Assessment Upper Extremity Assessment: (pt self limited; able to use bil UEs)           Communication Communication Communication: No difficulties   Cognition Arousal/Alertness: Awake/alert                                     General Comments: pt has a h/o dementia; slow processing today.   General Comments  Exercises     Shoulder Instructions      Home Living Family/patient expects to be discharged to:: Private residence Living Arrangements: Spouse/significant other Available Help at Discharge: Family                   Bathroom Toilet: Standard         Additional Comments: pt is claustrophobic since CVA. Washes at sink or uses cleansing spray      Prior Functioning/Environment Level of Independence: Independent                 OT Problem List:        OT Treatment/Interventions:      OT Goals(Current goals can be found in the care plan section) Acute Rehab OT Goals Patient Stated Goal: return home. OT Goal Formulation: All assessment and education complete, DC therapy  OT Frequency:     Barriers to D/C:            Co-evaluation              AM-PAC PT "6 Clicks" Daily Activity     Outcome Measure Help from another person eating meals?: None Help from another person taking care of personal grooming?: A Little Help from another person  toileting, which includes using toliet, bedpan, or urinal?: A Little Help from another person bathing (including washing, rinsing, drying)?: A Little Help from another person to put on and taking off regular upper body clothing?: A Little Help from another person to put on and taking off regular lower body clothing?: A Little 6 Click Score: 19   End of Session    Activity Tolerance: (pt limited) Patient left: in bed;with call bell/phone within reach;with bed alarm set  OT Visit Diagnosis: Muscle weakness (generalized) (M62.81)                Time: [id number] OT Time Calculation (min): 15 min Charges:  OT General Charges $OT Visit: 1 Visit OT Evaluation $OT Eval Low Complexity: 1 Low G-Codes:     OllieMaryellen Chance Karam, OTR/L [phone] 12/02/2017  Alroy Portela 12/02/2017, 10:06 AM

## 2017-12-02 NOTE — Progress Notes (Signed)
PROGRESS NOTE    DAISON BRAXTON  ZOX:[id number] DOB: August 23, 1935 DOA: 11/30/2017 PCP: [name], MD    Brief Narrative: 83 year old Caucasian male with a past medical history of tobacco abuse, COPD, CKD stage III, BPH, chronic indwelling Foley, history of stroke and dementia presented with worsening shortness of breath and cough.  Was thought to have COPD exacerbation and was hospitalized for further management.  Recently hospitalized with Pseudomonas UTI and was discharged on ciprofloxacin which he has completed.    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  Continue with steroids nebulizer treatments and doxycycline nasal cannula oxygen to keep sats greater than 90%.   Leukocytosis probably secondary to steroids.   History of stroke Patient has residual ataxia vascular dementia would explain the multiple falls.  Recommend home health PT services on discharge.   Stage III CKD creatinine slightly up from baseline continue to monitor.   Dysphagia secondary to stroke on dysphagia diet.  History of BPH with chronic indwelling Foley catheter  recently completed his treatment for Pseudomonas urinary tract infection.  Currently asymptomatic. DVT prophylaxis:  Code Status: full code.  Family Communication: wife at bedside.  Disposition Plan: d/c home after we wean him off oxygen. .   Consultants:  None.  Procedures: none.  Antimicrobials: none.   Subjective: Breathing has improved.   Objective: Vitals:   12/01/17 2105 12/02/17 0412 12/02/17 1313  12/02/17 1410  BP: 134/66 127/76 111/61   Pulse: 94 88 87   Resp: 18 16 16    Temp: (!) 97.4 F (36.3 C) 98.2 F (36.8 C) 97.8 F (36.6 C)   TempSrc: Oral Oral Oral   SpO2: 97% 91% 95% (!) 3%  Weight:      Height:        Intake/Output Summary (Last 24 hours) at 12/02/2017 1639 Last data filed at 12/02/2017 1313 Gross per 24 hour  Intake 960 ml  Output 2800 ml  Net -1840 ml   Filed Weights   11/30/17 1413  Weight: 62.4 kg (137 lb 9.1 oz)    Examination:  General exam: Appears calm and comfortable  Respiratory system: scattered wheezing posteriorly .  Cardiovascular system: S1 & S2 heard, RRR. No JVD, murmurs, rubs, gallops or clicks. No pedal edema. Gastrointestinal system: Abdomen is nondistended, soft and nontender. No organomegaly or masses felt. Normal bowel sounds heard. Central nervous system: Alert and oriented. No focal neurological deficits. Extremities: Symmetric 5 x 5 power. Skin: No rashes, lesions or ulcers Psychiatry: Judgement and insight appear normal. Mood & affect appropriate.     Data Reviewed: I have personally reviewed following labs and imaging studies  CBC: Recent Labs  Lab 11/30/17 0554 12/01/17 0425 12/02/17 0708  WBC 10.9* 24.4* 20.5*  NEUTROABS 7.2  --   --   HGB 13.9 13.0 13.5  HCT 43.4 40.5 41.8  MCV 94.3 93.3 93.5  PLT 327 339 333   Basic Metabolic Panel: Recent Labs  Lab 11/30/17 0555 12/01/17 0425 12/02/17 0708  NA 140 137 140  K 3.8 4.5 3.9  CL 105 104 101  CO2 25 24 29  GLUCOSE 113* 133* 93  BUN 11 22* 30*  CREATININE 1.36* 1.27* 1.50*  CALCIUM 9.0 9.2 9.4   GFR: Estimated Creatinine Clearance: 34.7 mL/min (A) (by C-G formula based on SCr of 1.5 mg/dL (H)). Liver Function Tests: Recent Labs  Lab 11/30/17 0555  AST 34  ALT 48  ALKPHOS 86  BILITOT 0.5  PROT 7.6  ALBUMIN 3.6   No results for input(s): LIPASE, AMYLASE in the last 168 hours. No results for input(s): AMMONIA in the last 168 hours. Coagulation  Profile: No results for input(s): INR, PROTIME in the last 168 hours. Cardiac Enzymes: No results for input(s): CKTOTAL, CKMB, CKMBINDEX, TROPONINI in the last 168 hours. BNP (last 3 results) No results for input(s): PROBNP in the last 8760 hours. HbA1C: No results for input(s): HGBA1C in the last 72 hours. CBG: No results for input(s): GLUCAP in the last 168 hours. Lipid Profile: No results for input(s): CHOL, HDL, LDLCALC, TRIG, CHOLHDL, LDLDIRECT in the last 72 hours. Thyroid Function Tests: No results for input(s): TSH, T4TOTAL, FREET4, T3FREE, THYROIDAB in the last 72 hours. Anemia Panel: No results for input(s): VITAMINB12, FOLATE, FERRITIN, TIBC, IRON, RETICCTPCT in the last 72 hours. Sepsis Labs: No results for input(s): PROCALCITON, LATICACIDVEN in the last 168 hours.  Recent Results (from the past 240 hour(s))  Blood culture (routine x 2)     Status: None   Collection Time: 11/23/17 11:55 AM  Result Value Ref Range Status   Specimen Description   Final    BLOOD BLOOD RIGHT HAND Performed at [facility], [address] [place], [place] [address]    Special Requests   Final    BOTTLES DRAWN AEROBIC ONLY Blood Culture results may not be optimal due to an inadequate volume of blood received in culture bottles Performed at [facility], [address] [place], [place] [address]    Culture   Final    NO GROWTH 5 DAYS Performed at [facility], [address], [place], [place] [address]    Report Status 11/28/2017 FINAL  Final  Blood culture (routine x 2)     Status: None   Collection Time: 11/23/17 11:55 AM  Result Value Ref Range Status   Specimen Description   Final    BLOOD LEFT ANTECUBITAL Performed at [facility], [address] [place], [place] [address]    Special Requests   Final    BOTTLES DRAWN AEROBIC AND ANAEROBIC Blood Culture adequate volume Performed at [facility], [address] [place], [place] [address]    Culture   Final    NO GROWTH 5 DAYS Performed at [facility], [address] [place], [place] [address]    Report Status 11/28/2017 FINAL  Final         Radiology Studies: No results found.      Scheduled Meds: . busPIRone  5 mg Oral BID  . doxycycline  100 mg Oral Q12H  . finasteride  5 mg Oral Daily  . ipratropium-albuterol  3 mL Nebulization TID  . nicotine  14 mg Transdermal Daily  . pantoprazole  20 mg Oral Daily  . polyethylene glycol  17 g Oral Daily  . predniSONE  40 mg Oral Q breakfast  . sodium chloride flush  3 mL Intravenous Q12H   Continuous Infusions: . sodium chloride       LOS: 2 days    Time spent:35  minutes.     Kathlen ModyVijaya Persephonie Hegwood, MD [facility] Pager [phone] If 7PM-7AM, please contact night-coverage [email or web address] Password [email or web address] 12/02/2017, 4:39 PM

## 2017-12-03 DIAGNOSIS — R339 Retention of urine, unspecified: Secondary | ICD-10-CM

## 2017-12-03 DIAGNOSIS — N401 Enlarged prostate with lower urinary tract symptoms: Secondary | ICD-10-CM

## 2017-12-03 MED ORDER — NICOTINE 14 MG/24HR TD PT24: 14.0000 mg | MEDICATED_PATCH | Freq: Every day | TRANSDERMAL | 0 refills | Status: AC

## 2017-12-03 MED ORDER — DOXYCYCLINE HYCLATE 100 MG PO TABS: 100.0000 mg | ORAL_TABLET | Freq: Two times a day (BID) | ORAL | 0 refills | Status: DC

## 2017-12-03 MED ORDER — PREDNISONE 20 MG PO TABS: ORAL_TABLET | ORAL | 0 refills | Status: DC

## 2017-12-03 MED ORDER — IPRATROPIUM-ALBUTEROL 0.5-2.5 (3) MG/3ML IN SOLN: 3.0000 mL | Freq: Four times a day (QID) | RESPIRATORY_TRACT | 1 refills | Status: AC | PRN

## 2017-12-03 NOTE — Care Management Important Message (Signed)
Important Message  Patient Details  Name: David Duke Greeno MRN: [id number] Date of Birth: 11/24/1934   Medicare Important Message Given:  Yes    Caren MacadamFuller, Arihanna Estabrook 12/03/2017, 11:47 AMImportant Message  Patient Details  Name: David Duke Vittorio MRN: [id number] Date of Birth: 02/02/1935   Medicare Important Message Given:  Yes    Caren MacadamFuller, Eulah Walkup 12/03/2017, 11:47 AM

## 2017-12-03 NOTE — Progress Notes (Signed)
[facility] to provide HHRN/PT. Sandford Crazeora Prabhjot Maddux RN,BSN,NCM [phone]

## 2017-12-06 NOTE — Discharge Summary (Signed)
Physician Discharge Summary  David Duke:[id number] DOB: 20-Nov-1934 DOA: 11/30/2017  PCP: [name], MD  Admit date: 11/30/2017 Discharge date: 12/03/2017  Admitted From: Home.  Disposition:  Home.   Recommendations for Outpatient Follow-up:  1. Follow up with PCP in 1-2 weeks 2. Please obtain BMP/CBC in one week 3. Home health on discharge.      Discharge Condition: stable.  CODE STATUS:full code.  Diet recommendation: Heart Healthy  Brief/Interim Summary: 83 year old Caucasian male with a past medical history of tobacco abuse, COPD, CKD stage III, BPH, chronic indwelling Foley, history of stroke and dementia presented with worsening shortness of breath and cough. Was thought to have COPD exacerbation and was hospitalized for further management. Recently hospitalized with Pseudomonas UTI and was discharged on ciprofloxacin which he has completed.    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  Continue with steroids over the next one week, with  nebulizer treatments and doxycycline . Patient ambulated and his oxygen sats have remained well over 90%.   Leukocytosis probably secondary to steroids.   History of stroke Patient has residual ataxia vascular dementia would explain the multiple falls.  Recommend home health PT services on discharge.   Stage III CKD creatinine slightly up from baseline, recommend outpatient follow up with a BMP in one week.    Dysphagia secondary to stroke on dysphagia diet.  History of BPH with chronic indwelling Foley  catheter  recently completed his treatment for Pseudomonas urinary tract infection.  Currently asymptomatic.    Discharge Instructions  Discharge Instructions    Diet - low sodium heart healthy   Complete by:  As directed    Discharge instructions   Complete by:  As directed    Follow up with PCP in one week.     Allergies as of 12/03/2017      Reactions   Penicillins Hives, Diarrhea, Nausea And Vomiting   Has patient had a PCN reaction causing immediate rash, facial/tongue/throat swelling, SOB or lightheadedness with hypotension: Yes Has patient had a PCN reaction causing severe rash involving mucus membranes or skin necrosis: No Has patient had a PCN reaction that required hospitalization: No Has patient had a PCN reaction occurring within the last 10 years: No If all of the above answers are "NO", then may proceed with Cephalosporin use.   Amoxicillin Hives, Diarrhea, Nausea And Vomiting   Sulfa Antibiotics Nausea Only      Medication List    STOP taking these medications   ciprofloxacin 250 MG tablet Commonly known as:  CIPRO     TAKE these medications   acetaminophen 500 MG tablet Commonly known as:  TYLENOL Take 500 mg by mouth every 6 (six) hours as needed for mild pain, moderate pain or headache.   albuterol (2.5 MG/3ML) 0.083% nebulizer solution Commonly known as:  PROVENTIL Take 2.5 mg by nebulization every 8 (eight) hours as needed for wheezing or shortness of breath.   albuterol 108 (90 Base) MCG/ACT inhaler Commonly known as:  PROVENTIL HFA;VENTOLIN HFA Inhale 1-2 puffs into the lungs every 6 (six) hours as needed for wheezing or shortness  of breath.   busPIRone 5 MG tablet Commonly known as:  BUSPAR Take 5 mg by mouth 2 (two) times daily.   diazepam 5 MG tablet Commonly known as:  VALIUM Take 5 mg by mouth every 6 (six) hours as needed for anxiety.   doxycycline 100 MG tablet Commonly known as:  VIBRA-TABS Take 1 tablet (100 mg total) by mouth  every 12 (twelve) hours.   finasteride 5 MG tablet Commonly known as:  PROSCAR Take 5 mg by mouth daily.   fluticasone 50 MCG/ACT nasal spray Commonly known as:  FLONASE Place 1 spray into both nostrils daily. What changed:    when to take this  reasons to take this   guaiFENesin 600 MG 12 hr tablet Commonly known as:  MUCINEX Take 2 tablets (1,200 mg total) by mouth 2 (two) times daily. What changed:    when to take this  reasons to take this   ipratropium-albuterol 0.5-2.5 (3) MG/3ML Soln Commonly known as:  DUONEB Take 3 mLs by nebulization every 6 (six) hours as needed.   multivitamin with minerals Tabs tablet Take 1 tablet by mouth daily.   nicotine 14 mg/24hr patch Commonly known as:  NICODERM CQ - dosed in mg/24 hours Place 1 patch (14 mg total) onto the skin daily.   nystatin powder Commonly known as:  MYCOSTATIN/NYSTOP Apply topically 4 (four) times daily.   polyethylene glycol packet Commonly known as:  MIRALAX / GLYCOLAX Take 17 g by mouth daily.   predniSONE 20 MG tablet Commonly known as:  DELTASONE Prednisone 20 mg daily for 3 days   PREVACID 30 MG capsule Generic drug:  lansoprazole Take 30 mg by mouth daily after breakfast.   ZOFRAN 4 MG tablet Generic drug:  ondansetron Take 4 mg by mouth every 6 (six) hours as needed.      Follow-up Information    [name], MD. Schedule an appointment as soon as possible for a visit in 1 week(s).   Specialty:  Internal Medicine Contact information: [address] [place] [address] [phone]          Allergies  Allergen Reactions  . Penicillins Hives, Diarrhea and Nausea And Vomiting    Has patient had a PCN reaction causing immediate rash, facial/tongue/throat swelling, SOB or lightheadedness with hypotension: Yes Has patient had a PCN reaction causing severe rash involving mucus membranes or skin necrosis: No Has patient had a PCN reaction that required hospitalization:  No Has patient had a PCN reaction occurring within the last 10 years: No If all of the above answers are "NO", then may proceed with Cephalosporin use.   [name] Amoxicillin Hives, Diarrhea and Nausea And Vomiting  . Sulfa Antibiotics Nausea Only    Consultations:  None.    Procedures/Studies: Dg Chest 2 View  Result Date: 11/30/2017 CLINICAL DATA:  83 year old male with shortness of breath EXAM: CHEST - 2 VIEW COMPARISON:  Chest radiograph dated 11/21/2017 FINDINGS: The lungs are clear. There is no pleural effusion or pneumothorax. The cardiac silhouette is within normal limits. No acute osseous pathology. IMPRESSION: No active cardiopulmonary disease. Electronically Signed   By: Elgie Collard M.D.   On: 11/30/2017 06:47   Dg Chest 2 View  Result Date: 11/21/2017 CLINICAL DATA:  Generalized weakness EXAM: CHEST - 2 VIEW COMPARISON:  September 18, 2017 FINDINGS: There is no edema or consolidation. Heart size and pulmonary vascularity are normal. There is aortic atherosclerosis. There is degenerative change in the thoracic spine. IMPRESSION: Aortic atherosclerosis.  No edema or consolidation. Aortic Atherosclerosis (ICD10-I70.0). Electronically Signed   By: Bretta BangWilliam  Woodruff III M.D.   On: 11/21/2017 17:48       Subjective: No new complaints.   Discharge Exam: Vitals:   12/03/17 0801 12/03/17 0900  BP:  (!) 102/49  Pulse:  91  Resp:  20  Temp:  98.4 F (36.9 C)  SpO2: 92% 93%   Vitals:   12/02/17 2139 12/03/17 0620 12/03/17 0801 12/03/17 0900  BP:  128/76  (!) 102/49  Pulse:  69  91  Resp:  20  20  Temp:  97.6 F (36.4 C)  98.4 F (36.9 C)  TempSrc:  Oral  Oral  SpO2: 95% 96% 92% 93%  Weight:      Height:        General: Pt is alert, awake, not in acute distress Cardiovascular: RRR, S1/S2 +, no rubs, no gallops Respiratory: CTA bilaterally, no wheezing, no rhonchi Abdominal: Soft, NT, ND, bowel sounds + Extremities: no edema, no cyanosis    The results of  significant diagnostics from this hospitalization (including imaging, microbiology, ancillary and laboratory) are listed below for reference.     Microbiology: No results found for this or any previous visit (from the past 240 hour(s)).   Labs: BNP (last 3 results) Recent Labs    09/07/17 0213 09/18/17 1355 11/30/17 0555  BNP 24.3 56.4 21.9   Basic Metabolic Panel: Recent Labs  Lab 11/30/17 0555 12/01/17 0425 12/02/17 0708  NA 140 137 140  K 3.8 4.5 3.9  CL 105 104 101  CO2 25 24 29   GLUCOSE 113* 133* 93  BUN 11 22* 30*  CREATININE 1.36* 1.27* 1.50*  CALCIUM 9.0 9.2 9.4   Liver Function Tests: Recent Labs  Lab 11/30/17 0555  AST 34  ALT 48  ALKPHOS 86  BILITOT 0.5  PROT 7.6  ALBUMIN 3.6   No results for input(s): LIPASE, AMYLASE in the last 168 hours. No results for input(s): AMMONIA in the last 168 hours. CBC: Recent Labs  Lab 11/30/17 0554 12/01/17 0425 12/02/17 0708  WBC 10.9* 24.4* 20.5*  NEUTROABS 7.2  --   --   HGB 13.9 13.0 13.5  HCT 43.4 40.5 41.8  MCV 94.3 93.3 93.5  PLT 327 339 333   Cardiac Enzymes: No results for input(s): CKTOTAL, CKMB, CKMBINDEX, TROPONINI in the last 168 hours. BNP: Invalid input(s): POCBNP CBG: No results for input(s): GLUCAP in the last 168 hours. D-Dimer No results for input(s): DDIMER in the last 72 hours. Hgb A1c No results for input(s): HGBA1C in the last 72 hours. Lipid Profile No results for input(s): CHOL, HDL, LDLCALC, TRIG, CHOLHDL, LDLDIRECT in the last 72 hours. Thyroid function studies No results for input(s): TSH, T4TOTAL, T3FREE, THYROIDAB in the last 72 hours.  Invalid input(s): FREET3 Anemia work up No results for input(s): VITAMINB12, FOLATE, FERRITIN, TIBC, IRON, RETICCTPCT in the last 72 hours. Urinalysis    Component Value Date/Time   COLORURINE YELLOW 11/30/2017 0632   APPEARANCEUR CLEAR 11/30/2017 0632   LABSPEC 1.008 11/30/2017 0632   PHURINE 5.0 11/30/2017 0632   GLUCOSEU  NEGATIVE 11/30/2017 0632   HGBUR SMALL (A) 11/30/2017 0632   BILIRUBINUR NEGATIVE 11/30/2017 0632   KETONESUR NEGATIVE 11/30/2017 0632   PROTEINUR NEGATIVE 11/30/2017 0632   UROBILINOGEN 0.2 03/31/2015 1850   NITRITE NEGATIVE 11/30/2017 0632   LEUKOCYTESUR TRACE (A) 11/30/2017 0632   Sepsis Labs Invalid input(s): PROCALCITONIN,  WBC,  LACTICIDVEN Microbiology No results found for this  or any previous visit (from the past 240 hour(s)).   Time coordinating discharge: Over 30 minutes  SIGNED:   [name], MD  [facility] 12/06/2017, 8:41 AM Pager   If 7PM-7AM, please contact night-coverage [email or web address] Password [email or web address]

## 2017-12-09 ENCOUNTER — Other Ambulatory Visit: Payer: Self-pay

## 2017-12-09 DIAGNOSIS — I714 Abdominal aortic aneurysm, without rupture, unspecified: Secondary | ICD-10-CM

## 2017-12-11 ENCOUNTER — Emergency Department (HOSPITAL_COMMUNITY)
Admission: EM | Admit: 2017-12-11 | Discharge: 2017-12-11 | Disposition: A | Discharge status: Home / Self Care | Payer: Medicare Other | Attending: Emergency Medicine | Admitting: Emergency Medicine

## 2017-12-11 DIAGNOSIS — F17210 Nicotine dependence, cigarettes, uncomplicated: Secondary | ICD-10-CM | POA: Insufficient documentation

## 2017-12-11 DIAGNOSIS — F0390 Unspecified dementia without behavioral disturbance: Secondary | ICD-10-CM | POA: Insufficient documentation

## 2017-12-11 DIAGNOSIS — Z8673 Personal history of transient ischemic attack (TIA), and cerebral infarction without residual deficits: Secondary | ICD-10-CM | POA: Diagnosis not present

## 2017-12-11 DIAGNOSIS — R05 Cough: Secondary | ICD-10-CM | POA: Diagnosis not present

## 2017-12-11 DIAGNOSIS — Z79899 Other long term (current) drug therapy: Secondary | ICD-10-CM | POA: Insufficient documentation

## 2017-12-11 DIAGNOSIS — J449 Chronic obstructive pulmonary disease, unspecified: Secondary | ICD-10-CM | POA: Diagnosis not present

## 2017-12-11 DIAGNOSIS — R531 Weakness: Secondary | ICD-10-CM | POA: Diagnosis present

## 2017-12-11 NOTE — Discharge Instructions (Addendum)
There are no signs of acute problems related to your prior stroke or emphysema.  Continue taking your usual medications.  Follow-up with your primary care doctor for further care and treatment as needed.

## 2017-12-11 NOTE — ED Triage Notes (Signed)
Transported by [facility] from home--pt reports generalized weakness which started this morning. Hx of stroke. EMS reports no neurological deficits. A & O x 3 (baseline). VSS with EMS.

## 2017-12-11 NOTE — ED Provider Notes (Signed)
[facility]-EMERGENCY DEPT Provider Note   CSN: [id number] Arrival date & time: 12/11/17  1403     History   Chief Complaint Chief Complaint  Patient presents with  . Weakness    HPI NEHEMYAH FOUSHEE is a 83 y.o. male.  He presents for evaluation of weakness which occurred after walking outside twice today.  History of similar problem recently, seen and hospitalized.  He continues to smoke cigarettes.  He uses a home nebulizer and rescue inhaler as well.  He has an occasional cough which is nonproductive.  He denies fever, chills, chest pain, focal weakness or paresthesia.  There are no other known modifying factors.  His last hospitalization was for COPD exacerbation, he was discharged on 12/03/17, with a 3-day prescription for prednisone.  He has not currently taking antibiotics.  HPI  Past Medical History:  Diagnosis Date  . AAA (abdominal aortic aneurysm) (HCC)   . AKI (acute kidney injury) (HCC)   . Anxiety   . Blind right eye   . BPH (benign prostatic hypertrophy) with urinary retention   . COPD (chronic obstructive pulmonary disease) (HCC)   . Dementia   . Depression   . High cholesterol   . Macular degeneration of both eyes   . Renal disorder   . Shortness of breath dyspnea   . Stroke [facility])    has residual slurred speech    Patient Active Problem List   Diagnosis Date Noted  . COPD with acute exacerbation (HCC) 11/30/2017  . UTI (urinary tract infection) 11/23/2017  . HCAP (healthcare-associated pneumonia) 09/18/2017  . Sepsis (HCC)   . COPD exacerbation (HCC) 09/07/2017  . Acute respiratory failure with hypoxia (HCC) 09/07/2017  . Rhinitis, chronic 09/07/2017  . Malnutrition of moderate degree 09/07/2017  . Elevated troponin 03/27/2017  . Diplopia 03/27/2017  . Bilateral leg edema 03/27/2017  . Enterocolitis 09/30/2016  . HLD (hyperlipidemia) 09/30/2016  . Tobacco abuse 09/30/2016  . Nausea vomiting and diarrhea 09/30/2016  .  History of stroke in prior 3 months 01/22/2016  . Chronic tension-type headache, not intractable   . Acute frontal sinusitis   . 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 Gait disturbance, post-stroke   . Ataxia, late effect of cerebrovascular disease   . BPH (benign prostatic hyperplasia)   . Urinary retention   . Adjustment disorder with mixed anxiety and depressed mood   . Chronic obstructive pulmonary disease (HCC)   . Cephalalgia   . Acute kidney injury superimposed on chronic kidney disease (HCC)   . Macular degeneration   . Prediabetes   . Hypokalemia   . Absolute anemia   . Aphasia   . Acute encephalopathy 11/25/2015  . Hyponatremia 11/25/2015  . Acute kidney injury (HCC) 11/25/2015  . Depression   . Anxiety   . COPD (chronic obstructive pulmonary disease) (HCC)   . ICH (intracerebral hemorrhage) (HCC) 11/22/2015    Past Surgical History:  Procedure Laterality Date  . NO PAST SURGERIES          Home Medications    Prior to Admission medications   Medication Sig Start Date End Date Taking? Authorizing Provider  acetaminophen (TYLENOL) 500 MG tablet Take 500 mg by mouth every 6 (six) hours as needed for mild pain, moderate pain or headache.    [provider]  albuterol (PROVENTIL HFA;VENTOLIN HFA) 108 (90 Base) MCG/ACT inhaler Inhale 1-2 puffs into the lungs every 6 (six) hours as needed for wheezing or  shortness of breath. 12/28/16   Long, Arlyss Repress, MD  albuterol (PROVENTIL) (2.5 MG/3ML) 0.083% nebulizer solution Take 2.5 mg by nebulization every 8 (eight) hours as needed for wheezing or shortness of breath.    [provider]  busPIRone (BUSPAR) 5 MG tablet Take 5 mg by mouth 2 (two) times daily.    [provider]  diazepam (VALIUM) 5 MG tablet Take 5 mg by mouth every 6 (six) hours as needed for anxiety.    [provider]  doxycycline (VIBRA-TABS) 100 MG tablet Take 1 tablet (100 mg total) by mouth every  12 (twelve) hours. 12/03/17   [name], MD  finasteride (PROSCAR) 5 MG tablet Take 5 mg by mouth daily. 09/22/16   [provider]  fluticasone (FLONASE) 50 MCG/ACT nasal spray Place 1 spray into both nostrils daily. Patient taking differently: Place 1 spray into both nostrils daily as needed for allergies or rhinitis.  12/11/15   Love, [name], PA-C  guaiFENesin (MUCINEX) 600 MG 12 hr tablet Take 2 tablets (1,200 mg total) by mouth 2 (two) times daily. Patient taking differently: Take 1,200 mg by mouth 2 (two) times daily as needed for cough or to loosen phlegm.  09/11/17   [name], MD  ipratropium-albuterol (DUONEB) 0.5-2.5 (3) MG/3ML SOLN Take 3 mLs by nebulization every 6 (six) hours as needed. 12/03/17   [name], MD  lansoprazole (PREVACID) 30 MG capsule Take 30 mg by mouth daily after breakfast.    [provider]  Multiple Vitamin (MULTIVITAMIN WITH MINERALS) TABS tablet Take 1 tablet by mouth daily.    [provider]  nicotine (NICODERM CQ - DOSED IN MG/24 HOURS) 14 mg/24hr patch Place 1 patch (14 mg total) onto the skin daily. 12/04/17   [name], MD  nystatin (MYCOSTATIN/NYSTOP) powder Apply topically 4 (four) times daily.    [provider]  ondansetron (ZOFRAN) 4 MG tablet Take 4 mg by mouth every 6 (six) hours as needed.    [provider]  polyethylene glycol (MIRALAX / GLYCOLAX) packet Take 17 g by mouth daily. 09/22/17   [name], MD  predniSONE (DELTASONE) 20 MG tablet Prednisone 20 mg daily for 3 days 12/03/17   [name], MD    Family History Family History  Problem Relation Age of Onset  . Colon cancer Mother   . Cerebral aneurysm Father     Social History Social History   Tobacco Use  . Smoking status: Current Every Day Smoker    Packs/day: 0.25    Years: 60.00    Pack years: 15.00    Types: Cigarettes  . Smokeless tobacco: Never Used  Substance Use Topics  . Alcohol use: No  . Drug  use: No     Allergies   Penicillins; Amoxicillin; and Sulfa antibiotics   Review of Systems Review of Systems  All other systems reviewed and are negative.    Physical Exam Updated Vital Signs BP 117/74 (BP Location: Right Arm)   Pulse 79   Temp 98.2 F (36.8 C) (Oral)   Resp (!) 24   SpO2 97%   Physical Exam  Constitutional: He is oriented to person, place, and time. He appears well-developed. No distress.  Elderly, frail  HENT:  Head: Normocephalic and atraumatic.  Right Ear: External ear normal.  Left Ear: External ear normal.  Eyes: Pupils are equal, round, and reactive to light. Conjunctivae and EOM are normal.  Neck: Normal range of motion and phonation normal.  Neck supple.  Cardiovascular: Normal rate, regular rhythm and normal heart sounds.  Pulmonary/Chest: Effort normal. No stridor. No respiratory distress. He has wheezes. He has no rales. He exhibits no tenderness and no bony tenderness.  Decreased air movement bilaterally with scattered wheezes, few.  Abdominal: Soft. There is no tenderness.  Musculoskeletal: Normal range of motion. He exhibits no edema or deformity.  Neurological: He is alert and oriented to person, place, and time. No cranial nerve deficit or sensory deficit. He exhibits normal muscle tone. Coordination normal.  Skin: Skin is warm, dry and intact.  Psychiatric: He has a normal mood and affect. His behavior is normal.  Nursing note and vitals reviewed.    ED Treatments / Results  Labs (all labs ordered are listed, but only abnormal results are displayed) Labs Reviewed - No data to display  EKG None  Radiology No results found.  Procedures Procedures (including critical care time)  Medications Ordered in ED Medications - No data to display   Initial Impression / Assessment and Plan / ED Course  I have reviewed the triage vital signs and the nursing notes.  Pertinent labs & imaging results that were available during my care  of the patient were reviewed by me and considered in my medical decision making (see chart for details).  Clinical Course as of Dec 11 1536  Fri Dec 11, 2017  1531 At this time he was able to ambulate entirely around the ED, without oxygen desaturation.  His wife was walking at his side while this occurred.  I discussed patient's presentation with his wife, who understands his concerns and is trying to get him to see his primary care doctor.  His wife indicates that he is somewhat difficult for her to deal with, but she continues to attempt to do that.  At this point the patient is attempting to leave before receiving discharge instructions.  His wife is with him.   [EW]    Clinical Course User Index [EW] Mancel BaleWentz, Tishie Altmann, MD     Patient Vitals for the past 24 hrs:  BP Temp Temp src Pulse Resp SpO2  12/11/17 1417 117/74 98.2 F (36.8 C) Oral 79 (!) 24 97 %    3:33 PM Reevaluation with update and discussion. After initial assessment and treatment, an updated evaluation reveals patient is able to ambulate easily.  No signs of acute COPD exacerbation, respiratory distress, or indications for severe illness.  These findings have been discussed with the patient and his wife.  His wife plans on taking him to his PCP as soon as possible. Mancel BaleElliott Lucille Witts   Medical decision making-COPD, with chronic ongoing tobacco abuse.  Patient presenting with normal vital signs.  He has had some weakness associated with prior right brain stroke and left sided weakness which is chronic.  Doubt acute CVA, metabolic instability or impending vascular collapse.  Nursing Notes Reviewed/ Care Coordinated Applicable Imaging Reviewed Interpretation of Laboratory Data incorporated into ED treatment  The patient appears reasonably screened and/or stabilized for discharge and I doubt any other medical condition or other [facility] requiring further screening, evaluation, or treatment in the ED at this time prior to discharge.  Plan:  Home Medications-continue usual medications; Home Treatments-rest, fluids; return here if the recommended treatment, does not improve the symptoms; Recommended follow up-PCP, as needed, and for follow-up care.     Final Clinical Impressions(s) / ED Diagnoses   Final diagnoses:  Chronic obstructive pulmonary disease, unspecified COPD type (HCC)  ED Discharge Orders    None       Mancel Bale, MD 12/11/17 1538

## 2017-12-11 NOTE — ED Notes (Signed)
ED Provider at bedside. 

## 2017-12-11 NOTE — ED Notes (Signed)
Bed: [facility] Expected date:  Expected time:  Means of arrival:  Comments: 

## 2017-12-22 ENCOUNTER — Encounter (HOSPITAL_COMMUNITY): Payer: Self-pay | Admitting: Emergency Medicine

## 2017-12-22 ENCOUNTER — Inpatient Hospital Stay (HOSPITAL_COMMUNITY)
Admission: EM | Admit: 2017-12-22 | Discharge: 2017-12-25 | DRG: 191 | Disposition: A | Discharge status: Skilled Nursing Facility | Payer: Medicare Other | Attending: Family Medicine | Admitting: Family Medicine

## 2017-12-22 ENCOUNTER — Other Ambulatory Visit: Payer: Self-pay

## 2017-12-22 ENCOUNTER — Emergency Department (HOSPITAL_COMMUNITY): Payer: Medicare Other

## 2017-12-22 DIAGNOSIS — I69319 Unspecified symptoms and signs involving cognitive functions following cerebral infarction: Secondary | ICD-10-CM | POA: Diagnosis not present

## 2017-12-22 DIAGNOSIS — F0150 Vascular dementia without behavioral disturbance: Secondary | ICD-10-CM | POA: Diagnosis present

## 2017-12-22 DIAGNOSIS — E7800 Pure hypercholesterolemia, unspecified: Secondary | ICD-10-CM | POA: Diagnosis present

## 2017-12-22 DIAGNOSIS — I69919 Unspecified symptoms and signs involving cognitive functions following unspecified cerebrovascular disease: Secondary | ICD-10-CM

## 2017-12-22 DIAGNOSIS — H5461 Unqualified visual loss, right eye, normal vision left eye: Secondary | ICD-10-CM | POA: Diagnosis present

## 2017-12-22 DIAGNOSIS — F419 Anxiety disorder, unspecified: Secondary | ICD-10-CM | POA: Diagnosis present

## 2017-12-22 DIAGNOSIS — T83518A Infection and inflammatory reaction due to other urinary catheter, initial encounter: Secondary | ICD-10-CM | POA: Diagnosis present

## 2017-12-22 DIAGNOSIS — H3530 Unspecified macular degeneration: Secondary | ICD-10-CM | POA: Diagnosis present

## 2017-12-22 DIAGNOSIS — T83511A Infection and inflammatory reaction due to indwelling urethral catheter, initial encounter: Secondary | ICD-10-CM

## 2017-12-22 DIAGNOSIS — Z800 Family history of malignant neoplasm of digestive organs: Secondary | ICD-10-CM | POA: Diagnosis not present

## 2017-12-22 DIAGNOSIS — N401 Enlarged prostate with lower urinary tract symptoms: Secondary | ICD-10-CM | POA: Diagnosis present

## 2017-12-22 DIAGNOSIS — N183 Chronic kidney disease, stage 3 unspecified: Secondary | ICD-10-CM | POA: Diagnosis present

## 2017-12-22 DIAGNOSIS — Z7951 Long term (current) use of inhaled steroids: Secondary | ICD-10-CM | POA: Diagnosis not present

## 2017-12-22 DIAGNOSIS — I69393 Ataxia following cerebral infarction: Secondary | ICD-10-CM

## 2017-12-22 DIAGNOSIS — J441 Chronic obstructive pulmonary disease with (acute) exacerbation: Secondary | ICD-10-CM | POA: Diagnosis present

## 2017-12-22 DIAGNOSIS — E785 Hyperlipidemia, unspecified: Secondary | ICD-10-CM | POA: Diagnosis present

## 2017-12-22 DIAGNOSIS — R0602 Shortness of breath: Secondary | ICD-10-CM

## 2017-12-22 DIAGNOSIS — R338 Other retention of urine: Secondary | ICD-10-CM | POA: Diagnosis present

## 2017-12-22 DIAGNOSIS — Z880 Allergy status to penicillin: Secondary | ICD-10-CM

## 2017-12-22 DIAGNOSIS — N390 Urinary tract infection, site not specified: Secondary | ICD-10-CM | POA: Diagnosis present

## 2017-12-22 DIAGNOSIS — Y846 Urinary catheterization as the cause of abnormal reaction of the patient, or of later complication, without mention of misadventure at the time of the procedure: Secondary | ICD-10-CM | POA: Diagnosis present

## 2017-12-22 DIAGNOSIS — I714 Abdominal aortic aneurysm, without rupture: Secondary | ICD-10-CM | POA: Diagnosis present

## 2017-12-22 DIAGNOSIS — F17210 Nicotine dependence, cigarettes, uncomplicated: Secondary | ICD-10-CM | POA: Diagnosis present

## 2017-12-22 DIAGNOSIS — Z881 Allergy status to other antibiotic agents status: Secondary | ICD-10-CM | POA: Diagnosis not present

## 2017-12-22 DIAGNOSIS — Z882 Allergy status to sulfonamides status: Secondary | ICD-10-CM

## 2017-12-22 DIAGNOSIS — I69993 Ataxia following unspecified cerebrovascular disease: Secondary | ICD-10-CM | POA: Diagnosis not present

## 2017-12-22 DIAGNOSIS — I69328 Other speech and language deficits following cerebral infarction: Secondary | ICD-10-CM

## 2017-12-22 LAB — URINALYSIS, ROUTINE W REFLEX MICROSCOPIC
BILIRUBIN URINE: NEGATIVE
Glucose, UA: NEGATIVE mg/dL
Ketones, ur: 20 mg/dL — AB
NITRITE: NEGATIVE
PROTEIN: NEGATIVE mg/dL
Specific Gravity, Urine: 1.009 (ref 1.005–1.030)
pH: 7 (ref 5.0–8.0)

## 2017-12-22 LAB — BASIC METABOLIC PANEL
Anion gap: 11 (ref 5–15)
BUN: 10 mg/dL (ref 6–20)
CHLORIDE: 103 mmol/L (ref 101–111)
CO2: 25 mmol/L (ref 22–32)
CREATININE: 1.53 mg/dL — AB (ref 0.61–1.24)
Calcium: 9 mg/dL (ref 8.9–10.3)
GFR, EST AFRICAN AMERICAN: 48 mL/min — AB (ref >60)
GFR, EST NON AFRICAN AMERICAN: 41 mL/min — AB (ref >60)
Glucose, Bld: 126 mg/dL — ABNORMAL HIGH (ref 65–99)
Potassium: 4 mmol/L (ref 3.5–5.1)
SODIUM: 139 mmol/L (ref 135–145)

## 2017-12-22 LAB — CBC
HCT: 43.8 % (ref 39.0–52.0)
HEMOGLOBIN: 14.8 g/dL (ref 13.0–17.0)
MCH: 30.7 pg (ref 26.0–34.0)
MCHC: 33.8 g/dL (ref 30.0–36.0)
MCV: 90.9 fL (ref 78.0–100.0)
PLATELETS: 222 10*3/uL (ref 150–400)
RBC: 4.82 MIL/uL (ref 4.22–5.81)
RDW: 13.8 % (ref 11.5–15.5)
WBC: 15.8 10*3/uL — ABNORMAL HIGH (ref 4.0–10.5)

## 2017-12-22 LAB — I-STAT CG4 LACTIC ACID, ED
LACTIC ACID, VENOUS: 2.51 mmol/L — AB (ref 0.5–1.9)
Lactic Acid, Venous: 2.23 mmol/L (ref 0.5–1.9)

## 2017-12-22 LAB — URINALYSIS, MICROSCOPIC (REFLEX)
CRYSTALS: NONE SEEN — AB
Sperm, UA: NONE SEEN
TRICHOMONAS UA: NONE SEEN

## 2017-12-22 LAB — I-STAT TROPONIN, ED: Troponin i, poc: 0.01 ng/mL (ref 0.00–0.08)

## 2017-12-22 MED ORDER — PANTOPRAZOLE SODIUM 40 MG PO TBEC
40.0000 mg | DELAYED_RELEASE_TABLET | Freq: Every day | ORAL | Status: DC
  Administered 2017-12-23 – 2017-12-25 (×3): 40 mg via ORAL
  Filled 2017-12-22 (×2): qty 1
  Filled 2017-12-22: qty 2

## 2017-12-22 MED ORDER — FLUTICASONE PROPIONATE 50 MCG/ACT NA SUSP
1.0000 | Freq: Every day | NASAL | Status: DC | PRN
  Administered 2017-12-23 – 2017-12-24 (×2): 1 via NASAL
  Filled 2017-12-22: qty 16

## 2017-12-22 MED ORDER — ALBUTEROL SULFATE (2.5 MG/3ML) 0.083% IN NEBU
2.5000 mg | INHALATION_SOLUTION | RESPIRATORY_TRACT | Status: DC | PRN
  Filled 2017-12-22: qty 3

## 2017-12-22 MED ORDER — FINASTERIDE 5 MG PO TABS
5.0000 mg | ORAL_TABLET | Freq: Every day | ORAL | Status: DC
  Administered 2017-12-23 – 2017-12-25 (×3): 5 mg via ORAL
  Filled 2017-12-22 (×3): qty 1

## 2017-12-22 MED ORDER — CEFTRIAXONE SODIUM 1 G IJ SOLR
1.0000 g | Freq: Once | INTRAMUSCULAR | Status: DC
  Filled 2017-12-22: qty 10

## 2017-12-22 MED ORDER — IPRATROPIUM-ALBUTEROL 0.5-2.5 (3) MG/3ML IN SOLN
3.0000 mL | Freq: Four times a day (QID) | RESPIRATORY_TRACT | Status: DC
  Administered 2017-12-22 – 2017-12-25 (×12): 3 mL via RESPIRATORY_TRACT
  Filled 2017-12-22 (×12): qty 3

## 2017-12-22 MED ORDER — ACETAMINOPHEN 500 MG PO TABS: 500.0000 mg | ORAL_TABLET | Freq: Four times a day (QID) | ORAL | Status: DC | PRN

## 2017-12-22 MED ORDER — POLYETHYL GLYCOL-PROPYL GLYCOL 0.4-0.3 % OP SOLN: 1.0000 [drp] | OPHTHALMIC | Status: DC | PRN

## 2017-12-22 MED ORDER — ONDANSETRON HCL 4 MG PO TABS: 4.0000 mg | ORAL_TABLET | Freq: Four times a day (QID) | ORAL | Status: DC | PRN

## 2017-12-22 MED ORDER — GUAIFENESIN ER 600 MG PO TB12
1200.0000 mg | ORAL_TABLET | Freq: Two times a day (BID) | ORAL | Status: DC | PRN
Start: 2017-12-22 — End: 2017-12-25

## 2017-12-22 MED ORDER — NICOTINE 7 MG/24HR TD PT24
7.0000 mg | MEDICATED_PATCH | Freq: Every day | TRANSDERMAL | Status: DC
  Filled 2017-12-22 (×4): qty 1

## 2017-12-22 MED ORDER — ACETAMINOPHEN 325 MG PO TABS: 650.0000 mg | ORAL_TABLET | Freq: Four times a day (QID) | ORAL | Status: DC | PRN

## 2017-12-22 MED ORDER — BUSPIRONE HCL 5 MG PO TABS
5.0000 mg | ORAL_TABLET | Freq: Two times a day (BID) | ORAL | Status: DC
  Administered 2017-12-22 – 2017-12-25 (×6): 5 mg via ORAL
  Filled 2017-12-22 (×6): qty 1

## 2017-12-22 MED ORDER — ENOXAPARIN SODIUM 40 MG/0.4ML ~~LOC~~ SOLN
40.0000 mg | SUBCUTANEOUS | Status: DC
  Administered 2017-12-22 – 2017-12-24 (×3): 40 mg via SUBCUTANEOUS
  Filled 2017-12-22 (×3): qty 0.4

## 2017-12-22 MED ORDER — SODIUM CHLORIDE 0.9 % IV SOLN
1.0000 g | Freq: Two times a day (BID) | INTRAVENOUS | Status: DC
Start: 2017-12-22 — End: 2017-12-24
  Administered 2017-12-22 – 2017-12-23 (×3): 1 g via INTRAVENOUS
  Filled 2017-12-22 (×4): qty 1

## 2017-12-22 MED ORDER — SODIUM CHLORIDE 0.9 % IV BOLUS (SEPSIS)
1000.0000 mL | Freq: Once | INTRAVENOUS | Status: AC
  Administered 2017-12-22: 1000 mL via INTRAVENOUS

## 2017-12-22 MED ORDER — NYSTATIN 100000 UNIT/GM EX POWD
CUTANEOUS | Status: DC | PRN
  Administered 2017-12-23: 10:00:00 via TOPICAL
  Filled 2017-12-22: qty 15

## 2017-12-22 MED ORDER — NICOTINE 14 MG/24HR TD PT24: 14.0000 mg | MEDICATED_PATCH | Freq: Every day | TRANSDERMAL | Status: DC

## 2017-12-22 MED ORDER — PREDNISONE 20 MG PO TABS
40.0000 mg | ORAL_TABLET | Freq: Every day | ORAL | Status: DC
  Administered 2017-12-23 – 2017-12-25 (×3): 40 mg via ORAL
  Filled 2017-12-22 (×4): qty 2

## 2017-12-22 MED ORDER — DIAZEPAM 5 MG PO TABS
5.0000 mg | ORAL_TABLET | Freq: Four times a day (QID) | ORAL | Status: DC | PRN
  Administered 2017-12-22 – 2017-12-23 (×3): 5 mg via ORAL
  Filled 2017-12-22 (×3): qty 1

## 2017-12-22 MED ORDER — DIAZEPAM 5 MG PO TABS
5.0000 mg | ORAL_TABLET | Freq: Once | ORAL | Status: AC
  Administered 2017-12-22: 5 mg via ORAL
  Filled 2017-12-22: qty 1

## 2017-12-22 MED ORDER — POLYETHYLENE GLYCOL 3350 17 G PO PACK
17.0000 g | PACK | Freq: Every day | ORAL | Status: DC | PRN
Start: 2017-12-22 — End: 2017-12-25

## 2017-12-22 MED ORDER — ADULT MULTIVITAMIN W/MINERALS CH
1.0000 | ORAL_TABLET | Freq: Every day | ORAL | Status: DC
  Administered 2017-12-23 – 2017-12-25 (×3): 1 via ORAL
  Filled 2017-12-22 (×2): qty 1

## 2017-12-22 MED ORDER — ACETAMINOPHEN 650 MG RE SUPP: 650.0000 mg | Freq: Four times a day (QID) | RECTAL | Status: DC | PRN

## 2017-12-22 MED ORDER — ALBUTEROL SULFATE (2.5 MG/3ML) 0.083% IN NEBU
5.0000 mg | INHALATION_SOLUTION | Freq: Once | RESPIRATORY_TRACT | Status: AC
  Administered 2017-12-22: 5 mg via RESPIRATORY_TRACT
  Filled 2017-12-22: qty 6

## 2017-12-22 MED ORDER — ONDANSETRON HCL 4 MG/2ML IJ SOLN: 4.0000 mg | Freq: Four times a day (QID) | INTRAMUSCULAR | Status: DC | PRN

## 2017-12-22 MED ORDER — MOMETASONE FURO-FORMOTEROL FUM 200-5 MCG/ACT IN AERO
2.0000 | INHALATION_SPRAY | Freq: Two times a day (BID) | RESPIRATORY_TRACT | Status: DC
  Administered 2017-12-22 – 2017-12-25 (×6): 2 via RESPIRATORY_TRACT
  Filled 2017-12-22: qty 8.8

## 2017-12-22 NOTE — ED Notes (Signed)
Lactic acid result given to Dr. Messick 

## 2017-12-22 NOTE — ED Triage Notes (Signed)
Patient arrived via EMS, reports shortness of breathe that has been going on for "a few days" Received 125mg  solu-medrol, 10mg  Albuterol, and 1mg  Atrovent. Patient is A&O X4, denies pain at this moment.

## 2017-12-22 NOTE — ED Provider Notes (Signed)
[facility] EMERGENCY DEPARTMENT Provider Note   CSN: [id number] Arrival date & time: 12/22/17  1407     History   Chief Complaint Chief Complaint  Patient presents with  . Shortness of Breath    HPI DERYL GIROUX is a 83 y.o. male.  DAIJON WENKE is a 83 y.o. Male with a history of COPD, hyperlipidemia, macular degeneration, stroke, dementia, and AAA, presents to the ED for evaluation of worsening shortness of breath over the past few days.  Patient is able to contribute minimally to history due to his dementia.  Patient's wife is at the bedside and reports over the past week he has been requiring an increasing amount of his albuterol nebulizers, and complaining of continued shortness of breath and wheezing.  She reports increasing cough with sputum production, no hemoptysis, and has been taking Mucinex as well.  Patient continues to endorse shortness of breath but denies chest pain.  He reports when he is coughing it makes his chest and abdomen hurt.  Wife denies any recent fevers or chills.  She reports a chronic long-term decline in patient's mental status and cognition but no acute changes over the past few days.  Patient has home health nursing support daily.  Patient received 2 DuoNeb's and 125 mg of Solu-Medrol with EMS in transit and has been improving since then.  Level 5 Caveat: Dementia     Past Medical History:  Diagnosis Date  . AAA (abdominal aortic aneurysm) (HCC)   . AKI (acute kidney injury) (HCC)   . Anxiety   . Blind right eye   . BPH (benign prostatic hypertrophy) with urinary retention   . COPD (chronic obstructive pulmonary disease) (HCC)   . Dementia   . Depression   . High cholesterol   . Macular degeneration of both eyes   . Renal disorder   . Shortness of breath dyspnea   . Stroke [facility])    has residual slurred speech    Patient Active Problem List   Diagnosis Date Noted  . COPD with acute exacerbation (HCC) 11/30/2017    . UTI (urinary tract infection) 11/23/2017  . HCAP (healthcare-associated pneumonia) 09/18/2017  . Sepsis (HCC)   . COPD exacerbation (HCC) 09/07/2017  . Acute respiratory failure with hypoxia (HCC) 09/07/2017  . Rhinitis, chronic 09/07/2017  . Malnutrition of moderate degree 09/07/2017  . Elevated troponin 03/27/2017  . Diplopia 03/27/2017  . Bilateral leg edema 03/27/2017  . Enterocolitis 09/30/2016  . HLD (hyperlipidemia) 09/30/2016  . Tobacco abuse 09/30/2016  . Nausea vomiting and diarrhea 09/30/2016  . History of stroke in prior 3 months 01/22/2016  . Chronic tension-type headache, not intractable   . Acute frontal sinusitis   . 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 Gait disturbance, post-stroke   . Ataxia, late effect of cerebrovascular disease   . BPH (benign prostatic hyperplasia)   . Urinary retention   . Adjustment disorder with mixed anxiety and depressed mood   . Chronic obstructive pulmonary disease (HCC)   . Cephalalgia   . Acute kidney injury superimposed on chronic kidney disease (HCC)   . Macular degeneration   . Prediabetes   . Hypokalemia   . Absolute anemia   . Aphasia   . Acute encephalopathy 11/25/2015  . Hyponatremia 11/25/2015  . Acute kidney injury (HCC) 11/25/2015  . Depression   . Anxiety   . COPD (chronic obstructive pulmonary disease) (HCC)   . ICH (intracerebral hemorrhage) (HCC)  11/22/2015    Past Surgical History:  Procedure Laterality Date  . NO PAST SURGERIES          Home Medications    Prior to Admission medications   Medication Sig Start Date End Date Taking? Authorizing Provider  acetaminophen (TYLENOL) 500 MG tablet Take 500 mg by mouth every 6 (six) hours as needed for mild pain, moderate pain or headache.    [provider]  albuterol (PROVENTIL HFA;VENTOLIN HFA) 108 (90 Base) MCG/ACT inhaler Inhale 1-2 puffs into the lungs every 6 (six) hours as needed for wheezing or shortness of  breath. 12/28/16   Long, Arlyss Repress, MD  albuterol (PROVENTIL) (2.5 MG/3ML) 0.083% nebulizer solution Take 2.5 mg by nebulization every 8 (eight) hours as needed for wheezing or shortness of breath.    [provider]  busPIRone (BUSPAR) 5 MG tablet Take 5 mg by mouth 2 (two) times daily.    [provider]  diazepam (VALIUM) 5 MG tablet Take 5 mg by mouth every 6 (six) hours as needed for anxiety.    [provider]  doxycycline (VIBRA-TABS) 100 MG tablet Take 1 tablet (100 mg total) by mouth every 12 (twelve) hours. 12/03/17   [name], MD  finasteride (PROSCAR) 5 MG tablet Take 5 mg by mouth daily. 09/22/16   [provider]  fluticasone (FLONASE) 50 MCG/ACT nasal spray Place 1 spray into both nostrils daily. Patient taking differently: Place 1 spray into both nostrils daily as needed for allergies or rhinitis.  12/11/15   Love, [name], PA-C  guaiFENesin (MUCINEX) 600 MG 12 hr tablet Take 2 tablets (1,200 mg total) by mouth 2 (two) times daily. Patient taking differently: Take 1,200 mg by mouth 2 (two) times daily as needed for cough or to loosen phlegm.  09/11/17   [name], MD  ipratropium-albuterol (DUONEB) 0.5-2.5 (3) MG/3ML SOLN Take 3 mLs by nebulization every 6 (six) hours as needed. 12/03/17   [name], MD  lansoprazole (PREVACID) 30 MG capsule Take 30 mg by mouth daily after breakfast.    [provider]  Multiple Vitamin (MULTIVITAMIN WITH MINERALS) TABS tablet Take 1 tablet by mouth daily.    [provider]  nicotine (NICODERM CQ - DOSED IN MG/24 HOURS) 14 mg/24hr patch Place 1 patch (14 mg total) onto the skin daily. 12/04/17   [name], MD  nystatin (MYCOSTATIN/NYSTOP) powder Apply topically 4 (four) times daily.    [provider]  ondansetron (ZOFRAN) 4 MG tablet Take 4 mg by mouth every 6 (six) hours as needed.    [provider]  polyethylene glycol (MIRALAX / GLYCOLAX) packet Take 17 g by  mouth daily. 09/22/17   [name], MD  predniSONE (DELTASONE) 20 MG tablet Prednisone 20 mg daily for 3 days 12/03/17   [name], MD    Family History Family History  Problem Relation Age of Onset  . Colon cancer Mother   . Cerebral aneurysm Father     Social History Social History   Tobacco Use  . Smoking status: Current Every Day Smoker    Packs/day: 0.25    Years: 60.00    Pack years: 15.00    Types: Cigarettes  . Smokeless tobacco: Never Used  Substance Use Topics  . Alcohol use: No  . Drug use: No     Allergies   Penicillins; Amoxicillin; and Sulfa antibiotics   Review of Systems Review of Systems  Unable to perform ROS: Dementia  Physical Exam Updated Vital Signs BP (!) 151/103   Pulse (!) 104   Resp 17   SpO2 97%   Physical Exam  Constitutional: He appears well-developed and well-nourished. No distress.  HENT:  Head: Normocephalic and atraumatic.  Mouth/Throat: Oropharynx is clear and moist.  Eyes: Right eye exhibits no discharge. Left eye exhibits no discharge.  Neck: Neck supple.  No stridor  Cardiovascular: Normal rate, regular rhythm, normal heart sounds and intact distal pulses.  Pulmonary/Chest: No stridor. No respiratory distress. He has wheezes. He has no rales. He exhibits no tenderness.  Patient is mildly tachypnea, respirations unlabored, mild expiratory wheezes throughout bilaterally, patient coughing intermittently during exam  Abdominal: Soft. Bowel sounds are normal. He exhibits no distension and no mass. There is no tenderness. There is no guarding.  Musculoskeletal:  No lower extremity swelling bilaterally  Neurological: He is alert. Coordination normal.  Skin: Skin is warm and dry. Capillary refill takes less than 2 seconds. He is not diaphoretic.  Psychiatric: He has a normal mood and affect. His behavior is normal.  Nursing note and vitals reviewed.    ED Treatments / Results  Labs (all labs ordered are listed,  but only abnormal results are displayed) Labs Reviewed  BASIC METABOLIC PANEL  CBC  I-STAT TROPONIN, ED    EKG EKG Interpretation  Date/Time:  Tuesday December 22 2017 14:14:30 EDT Ventricular Rate:  92 PR Interval:    QRS Duration: 118 QT Interval:  371 QTC Calculation: 459 R Axis:   -87 Text Interpretation:  Sinus rhythm LAD, consider left anterior fascicular block Non-specific ST-t changes Confirmed by Cathren LaineSteinl, Kevin ([id number]) on 12/22/2017 2:30:49 PM   Radiology No results found.  Procedures Procedures (including critical care time)  Medications Ordered in ED Medications  albuterol (PROVENTIL) (2.5 MG/3ML) 0.083% nebulizer solution 5 mg (5 mg Nebulization Given 12/22/17 1533)     Initial Impression / Assessment and Plan / ED Course  I have reviewed the triage vital signs and the nursing notes.  Pertinent labs & imaging results that were available during my care of the patient were reviewed by me and considered in my medical decision making (see chart for details).  Presents via EMS for evaluation of shortness of breath and wheezing, history of COPD, coughing with increased sputum production, no fevers.  Patient received 125 of Solu-Medrol and 2 duo nebs with EMS in transit and is already improving.  On initial evaluation patient is mildly tachypneic with normal O2 sats on room air and no significant increased work of breathing, lungs with mild expiratory wheezing bilaterally.  Patient denies any chest pain.  Patient has dementia which wife reports has been progressively worsening but denies any acute change in mental status.  We will get basic labs, troponin EKG and chest x-ray will give 1 additional albuterol neb and reassess.  At shift change care [facility] who will follow up on patient's labs and x-ray, reevaluate the patient and despite appropriately.  I expect patient will be improved and be able to be discharged home with short prednisone burst and  azithromycin for increased sputum production with close follow-up with his primary care doctor.  Patient has home health nursing care already.  Patient discussed with Dr. Denton LankSteinl, who saw patient as well and agrees with plan.  Final Clinical Impressions(s) / ED Diagnoses   Final diagnoses:  COPD exacerbation (HCC)  SOB (shortness of breath)    ED Discharge Orders    None  Dartha Lodge, PA-C 12/22/17 1558    Cathren Laine, MD 12/23/17 1257

## 2017-12-22 NOTE — ED Provider Notes (Signed)
Care assumed from previous provider PA Ford. Please see note for further details. Case discussed, plan agreed upon. Briefly, patient is a 83 y.o. male who presented to ED for shortness of breath c/w COPD exacerbation. Given duonebs in route with improvement. EKG reassuring. CXR and labs pending. Also given another neb treatment in ED. Will follow up on CXR and labs as well as recheck after neb treatment. If labs and imaging are reassuring and patient feels improved, anticipate discharge home with steroid burst and azithromycin.   CXR without acute infiltrate. CBC reviewed and notable for leukocytosis of 15.8.  This is actually down from previous white counts of 20.5 and 24.42 and 3 weeks ago respectively.  Creatinine of 1.53 which is near baseline.  Patient re-evaluated.  He notes improvement of shortness of breath, however is very concerned about his lower abdominal pain and weakness.  He reports feeling so weak that he does not feel as if he could walk on his own.  Wife at bedside states that she has been having to help him walk around for the last 2-3 days due to this weakness.  He has been complaining of lower abdominal pain intermittently as well.  Wife feels as if this constellation of symptoms is similar to his previous UTIs.  No peritoneal signs on abdominal exam.  Heart rate is slightly elevated, running from low 100s-110's.  Afebrile.  Will obtain urine sample and reevaluate.  UA with large leuks, too numerous to count white cells and many bacteria.  Urine and blood cultures obtained.  Lactic elevated at 2.51.  Fluids initiated.   Discussed antibiotic choice with pharmacy given recent cultures and allergies.  Recommending Rocephin and states that he has had several cephalosporins and tolerated this fine in the past.  Hospitalist consulted who will admit.    Ward, Chase PicketJaime Pilcher, PA-C 12/22/17 2007    Wynetta FinesMessick, Peter C, MD 12/23/17 [phone]

## 2017-12-22 NOTE — H&P (Signed)
History and Physical    David Duke:[id number] DOB: 1934-09-10 DOA: 12/22/2017  PCP: [name], MD  Patient coming from: Home  I have personally briefly reviewed patient's old medical records in [facility]  Chief Complaint: Cough, SOB  HPI: David Duke is a 83 y.o. male with medical history significant of COPD, ICH residual ataxia and vascular dementia, chronic indwelling foley with recurrent UTIs.  Patient admitted 2x last month, first time was for CAUTI with pseudomonas and enterococcus.  Second admit was for COPD exacerbation.  Since discharge from COPD exacerbation he is unfortunately still smoking some (~4 cigarettes a day per wife), last smoked this AM.  Presents to ED today with cough, SOB, worsening over past couple of days.  Has sputum production.  Not helped by mucinex.   ED Course: Got solumedrol, duonebs with EMS.  CXR neg.  UA shows UTI.  WBC 15k.  Hospitalist asked to admit for COPD exacerbation.   Review of Systems: As per HPI otherwise 10 point review of systems negative.   Past Medical History:  Diagnosis Date  . AAA (abdominal aortic aneurysm) (HCC)   . AKI (acute kidney injury) (HCC)   . Anxiety   . Blind right eye   . BPH (benign prostatic hypertrophy) with urinary retention   . COPD (chronic obstructive pulmonary disease) (HCC)   . Dementia   . Depression   . High cholesterol   . Macular degeneration of both eyes   . Renal disorder   . Shortness of breath dyspnea   . Stroke [facility])    has residual slurred speech    Past Surgical History:  Procedure Laterality Date  . NO PAST SURGERIES       reports that he has been smoking cigarettes.  He has a 15.00 pack-year smoking history. He has never used smokeless tobacco. He reports that he does not drink alcohol or use drugs.  Allergies  Allergen Reactions  . Penicillins Hives, Diarrhea and Nausea And Vomiting    Has patient had a PCN reaction causing immediate rash,  facial/tongue/throat swelling, SOB or lightheadedness with hypotension: Yes Has patient had a PCN reaction causing severe rash involving mucus membranes or skin necrosis: No Has patient had a PCN reaction that required hospitalization: No Has patient had a PCN reaction occurring within the last 10 years: No If all of the above answers are "NO", then may proceed with Cephalosporin use.   [name] Amoxicillin Hives, Diarrhea and Nausea And Vomiting  . Sulfa Antibiotics Nausea Only    Family History  Problem Relation Age of Onset  . Colon cancer Mother   . Cerebral aneurysm Father      Prior to Admission medications   Medication Sig Start Date End Date Taking? Authorizing Provider  acetaminophen (TYLENOL) 500 MG tablet Take 500 mg by mouth every 6 (six) hours as needed for mild pain, moderate pain or headache.   Yes [provider]  albuterol (PROVENTIL HFA;VENTOLIN HFA) 108 (90 Base) MCG/ACT inhaler Inhale 1-2 puffs into the lungs every 6 (six) hours as needed for wheezing or shortness of breath. 12/28/16  Yes Long, Arlyss Repress, MD  albuterol (PROVENTIL) (2.5 MG/3ML) 0.083% nebulizer solution Take 2.5 mg by nebulization every 8 (eight) hours as needed for wheezing or shortness of breath.   Yes [provider]  busPIRone (BUSPAR) 5 MG tablet Take 5 mg by mouth 2 (two) times daily.   Yes [provider]  diazepam (VALIUM) 5 MG tablet Take  5 mg by mouth every 6 (six) hours as needed for anxiety.   Yes [provider]  finasteride (PROSCAR) 5 MG tablet Take 5 mg by mouth daily. 09/22/16  Yes [provider]  fluticasone (FLONASE) 50 MCG/ACT nasal spray Place 1 spray into both nostrils daily. Patient taking differently: Place 1 spray into both nostrils daily as needed for allergies or rhinitis.  12/11/15  Yes Love, [name], PA-C  guaiFENesin (MUCINEX) 600 MG 12 hr tablet Take 2 tablets (1,200 mg total) by mouth 2 (two) times daily. Patient taking differently: Take  1,200 mg by mouth 2 (two) times daily as needed for cough or to loosen phlegm.  09/11/17  Yes [name], MD  ipratropium-albuterol (DUONEB) 0.5-2.5 (3) MG/3ML SOLN Take 3 mLs by nebulization every 6 (six) hours as needed. 12/03/17  Yes [name], MD  lansoprazole (PREVACID) 30 MG capsule Take 30 mg by mouth daily after breakfast.   Yes [provider]  Multiple Vitamin (MULTIVITAMIN WITH MINERALS) TABS tablet Take 1 tablet by mouth daily.   Yes [provider]  nicotine (NICODERM CQ - DOSED IN MG/24 HOURS) 14 mg/24hr patch Place 1 patch (14 mg total) onto the skin daily. 12/04/17  Yes [name], MD  nystatin (MYCOSTATIN/NYSTOP) powder Apply topically as needed.    Yes [provider]  ondansetron (ZOFRAN) 4 MG tablet Take 4 mg by mouth every 6 (six) hours as needed.   Yes [provider]  Polyethyl Glycol-Propyl Glycol (SYSTANE) 0.4-0.3 % SOLN Apply 1 drop to eye as needed.   Yes [provider]  polyethylene glycol (MIRALAX / GLYCOLAX) packet Take 17 g by mouth daily. Patient taking differently: Take 17 g by mouth daily as needed.  09/22/17  Yes [name], MD    Physical Exam: Vitals:   12/22/17 1845 12/22/17 1900 12/22/17 1915 12/22/17 1930  BP: (!) 147/86 (!) 146/87 (!) 137/104 (!) 136/115  Pulse: (!) 109 (!) 103 (!) 107 (!) 107  Resp: (!) 29 (!) 27 (!) 21 (!) 26  Temp:      TempSrc:      SpO2: 97% 94% 97% 95%    Constitutional: NAD, calm, comfortable Eyes: PERRL, lids and conjunctivae normal ENMT: Mucous membranes are moist. Posterior pharynx clear of any exudate or lesions.Normal dentition.  Neck: normal, supple, no masses, no thyromegaly Respiratory: Few expiratory wheezes Cardiovascular: Regular rate and rhythm, no murmurs / rubs / gallops. No extremity edema. 2+ pedal pulses. No carotid bruits.  Abdomen: no tenderness, no masses palpated. No hepatosplenomegaly. Bowel sounds positive.  Musculoskeletal: no clubbing /  cyanosis. No joint deformity upper and lower extremities. Good ROM, no contractures. Normal muscle tone.  Skin: no rashes, lesions, ulcers. No induration Neurologic: CN 2-12 grossly intact. Sensation intact, DTR normal. Strength 5/5 in all 4.  Psychiatric: Normal judgment and insight. Alert and oriented x 3. Normal mood.    Labs on Admission: I have personally reviewed following labs and imaging studies  CBC: Recent Labs  Lab 12/22/17 1444  WBC 15.8*  HGB 14.8  HCT 43.8  MCV 90.9  PLT 222   Basic Metabolic Panel: Recent Labs  Lab 12/22/17 1444  NA 139  K 4.0  CL 103  CO2 25  GLUCOSE 126*  BUN 10  CREATININE 1.53*  CALCIUM 9.0   GFR: CrCl cannot be calculated (Unknown ideal weight.). Liver Function Tests: No results for input(s): AST, ALT, ALKPHOS, BILITOT, PROT, ALBUMIN in the last 168 hours. No results  for input(s): LIPASE, AMYLASE in the last 168 hours. No results for input(s): AMMONIA in the last 168 hours. Coagulation Profile: No results for input(s): INR, PROTIME in the last 168 hours. Cardiac Enzymes: No results for input(s): CKTOTAL, CKMB, CKMBINDEX, TROPONINI in the last 168 hours. BNP (last 3 results) No results for input(s): PROBNP in the last 8760 hours. HbA1C: No results for input(s): HGBA1C in the last 72 hours. CBG: No results for input(s): GLUCAP in the last 168 hours. Lipid Profile: No results for input(s): CHOL, HDL, LDLCALC, TRIG, CHOLHDL, LDLDIRECT in the last 72 hours. Thyroid Function Tests: No results for input(s): TSH, T4TOTAL, FREET4, T3FREE, THYROIDAB in the last 72 hours. Anemia Panel: No results for input(s): VITAMINB12, FOLATE, FERRITIN, TIBC, IRON, RETICCTPCT in the last 72 hours. Urine analysis:    Component Value Date/Time   COLORURINE YELLOW 12/22/2017 1707   APPEARANCEUR CLOUDY (A) 12/22/2017 1707   LABSPEC 1.009 12/22/2017 1707   PHURINE 7.0 12/22/2017 1707   GLUCOSEU NEGATIVE 12/22/2017 1707   HGBUR SMALL (A) 12/22/2017  1707   BILIRUBINUR NEGATIVE 12/22/2017 1707   KETONESUR 20 (A) 12/22/2017 1707   PROTEINUR NEGATIVE 12/22/2017 1707   UROBILINOGEN 0.2 03/31/2015 1850   NITRITE NEGATIVE 12/22/2017 1707   LEUKOCYTESUR LARGE (A) 12/22/2017 1707    Radiological Exams on Admission: Dg Chest 2 View  Result Date: 12/22/2017 CLINICAL DATA:  Cough and weakness for 2 days, history stroke, COPD, fell last week, trouble walking, smoker EXAM: CHEST - 2 VIEW COMPARISON:  11/30/2017 FINDINGS: Normal heart size, mediastinal contours, and pulmonary vascularity. Lungs hyperinflated but clear. Minimal central peribronchial thickening. No acute infiltrate, pleural effusion or pneumothorax. Bones demineralized. IMPRESSION: Hyperinflation and central peribronchial thickening question COPD. No acute infiltrate. Electronically Signed   By: Ulyses SouthwardMark  Boles M.D.   On: 12/22/2017 16:27    EKG: Independently reviewed.  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 COPD exacerbation - 1. COPD pathway 2. Scheduled dulera 3. Scheduled atrovent 4. PRN albuterol 5. Adult wheeze protocol 6. Nicotine patch!  Discussed smoking cessation need. 2. CAUTI - 1. Pseudomonas and enterococcus in March 2. Allergic to PCN, but can take cephalosporins 3. Merrem empirically 4. Cx pending 5. Change foley 3. CKD stage 3 - chronic and stable 4. Ataxia and vascular dementia - wife is caregiver  DVT prophylaxis: Lovenox Code Status: Full Family Communication: Spoke with wife on phone Disposition Plan: Home after admit Consults called: None Admission status: Admit to inpatient   Hillary BowGARDNER, Armelia Penton M. DO [facility] Pager [phone]  If 7AM-7PM, please  contact day team taking care of patient [email or web address] Password [email or web address]  12/22/2017, 8:09 PM

## 2017-12-22 NOTE — ED Notes (Signed)
Regular tray ordered.

## 2017-12-22 NOTE — Progress Notes (Signed)
Pt admitted with positive UTI. Foley removed per order. RN to replace with new Foley due to urinary retention.

## 2017-12-22 NOTE — Progress Notes (Signed)
Pharmacy Antibiotic Note  David BarlowDavid R Duke is a [age] y.o. male admitted on 12/22/2017 with UTI.  Pharmacy has been consulted for meropenem dosing. Patient has allergy to penicillin, and has tolerated cephalosporins previously. In March, patient cultures grew pseudomonas and enterococcus. MD elected to prescribe meropenem. Normalized CrCl ~40.  Plan: Meropenem 1 gm every 12 hours F/U cultures, LOT, and clinical progress.     Temp (24hrs), Avg:98 F (36.7 C), Min:98 F (36.7 C), Max:98 F (36.7 C)  Recent Labs  Lab 12/22/17 1444 12/22/17 1847  WBC 15.8*  --   CREATININE 1.53*  --   LATICACIDVEN  --  2.51*    CrCl cannot be calculated (Unknown ideal weight.).    Allergies  Allergen Reactions  . Penicillins Hives, Diarrhea and Nausea And Vomiting    Has patient had a PCN reaction causing immediate rash, facial/tongue/throat swelling, SOB or lightheadedness with hypotension: Yes Has patient had a PCN reaction causing severe rash involving mucus membranes or skin necrosis: No Has patient had a PCN reaction that required hospitalization: No Has patient had a PCN reaction occurring within the last 10 years: No If all of the above answers are "NO", then may proceed with Cephalosporin use.   [name]. Amoxicillin Hives, Diarrhea and Nausea And Vomiting  . Sulfa Antibiotics Nausea Only    Antimicrobials this admission: Meropenem 4/16 >>  Dose adjustments this admission: none  Microbiology results: Cultures pending  Thank you for allowing pharmacy to be a part of this patient's care.  Lina SarBrooke A Karol Liendo 12/22/2017 7:47 PM

## 2017-12-23 ENCOUNTER — Encounter (HOSPITAL_COMMUNITY): Payer: Self-pay | Admitting: General Practice

## 2017-12-23 DIAGNOSIS — N390 Urinary tract infection, site not specified: Secondary | ICD-10-CM

## 2017-12-23 DIAGNOSIS — T83511A Infection and inflammatory reaction due to indwelling urethral catheter, initial encounter: Secondary | ICD-10-CM

## 2017-12-23 DIAGNOSIS — N183 Chronic kidney disease, stage 3 (moderate): Secondary | ICD-10-CM

## 2017-12-23 DIAGNOSIS — I69993 Ataxia following unspecified cerebrovascular disease: Secondary | ICD-10-CM

## 2017-12-23 DIAGNOSIS — J441 Chronic obstructive pulmonary disease with (acute) exacerbation: Principal | ICD-10-CM

## 2017-12-23 DIAGNOSIS — I69919 Unspecified symptoms and signs involving cognitive functions following unspecified cerebrovascular disease: Secondary | ICD-10-CM

## 2017-12-23 LAB — BASIC METABOLIC PANEL
Anion gap: 10 (ref 5–15)
BUN: 15 mg/dL (ref 6–20)
CALCIUM: 8.7 mg/dL — AB (ref 8.9–10.3)
CO2: 23 mmol/L (ref 22–32)
CREATININE: 1.48 mg/dL — AB (ref 0.61–1.24)
Chloride: 107 mmol/L (ref 101–111)
GFR calc Af Amer: 50 mL/min — ABNORMAL LOW (ref >60)
GFR calc non Af Amer: 43 mL/min — ABNORMAL LOW (ref >60)
Glucose, Bld: 121 mg/dL — ABNORMAL HIGH (ref 65–99)
Potassium: 4.4 mmol/L (ref 3.5–5.1)
SODIUM: 140 mmol/L (ref 135–145)

## 2017-12-23 LAB — BLOOD CULTURE ID PANEL (REFLEXED)
Acinetobacter baumannii: NOT DETECTED
CANDIDA GLABRATA: NOT DETECTED
CANDIDA KRUSEI: NOT DETECTED
CANDIDA PARAPSILOSIS: NOT DETECTED
CANDIDA TROPICALIS: NOT DETECTED
Candida albicans: NOT DETECTED
ESCHERICHIA COLI: NOT DETECTED
Enterobacter cloacae complex: NOT DETECTED
Enterobacteriaceae species: NOT DETECTED
Enterococcus species: NOT DETECTED
Haemophilus influenzae: NOT DETECTED
KLEBSIELLA OXYTOCA: NOT DETECTED
KLEBSIELLA PNEUMONIAE: NOT DETECTED
Listeria monocytogenes: NOT DETECTED
Methicillin resistance: NOT DETECTED
NEISSERIA MENINGITIDIS: NOT DETECTED
PROTEUS SPECIES: NOT DETECTED
Pseudomonas aeruginosa: NOT DETECTED
STAPHYLOCOCCUS SPECIES: DETECTED — AB
STREPTOCOCCUS PYOGENES: NOT DETECTED
Serratia marcescens: NOT DETECTED
Staphylococcus aureus (BCID): NOT DETECTED
Streptococcus agalactiae: NOT DETECTED
Streptococcus pneumoniae: NOT DETECTED
Streptococcus species: NOT DETECTED

## 2017-12-23 LAB — CBC
HCT: 38.1 % — ABNORMAL LOW (ref 39.0–52.0)
Hemoglobin: 12.5 g/dL — ABNORMAL LOW (ref 13.0–17.0)
MCH: 30.1 pg (ref 26.0–34.0)
MCHC: 32.8 g/dL (ref 30.0–36.0)
MCV: 91.8 fL (ref 78.0–100.0)
Platelets: 217 10*3/uL (ref 150–400)
RBC: 4.15 MIL/uL — ABNORMAL LOW (ref 4.22–5.81)
RDW: 14.1 % (ref 11.5–15.5)
WBC: 9.5 10*3/uL (ref 4.0–10.5)

## 2017-12-23 MED ORDER — DIAZEPAM 5 MG PO TABS
5.0000 mg | ORAL_TABLET | Freq: Four times a day (QID) | ORAL | Status: DC
  Administered 2017-12-23 – 2017-12-25 (×8): 5 mg via ORAL
  Filled 2017-12-23 (×8): qty 1

## 2017-12-23 NOTE — Care Management Note (Signed)
Case Management Note  Patient Details  Name: David Duke MRN: [id number] Date of Birth: 11/07/1934  Subjective/Objective:                 Spoke to patient at the bedside. He states he lives at home with his wife. Neither drive and hey rely on a neighbor to take them to apts etc. 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 Patient with 2 readmissions within a month, 5 in the last 6, and 7 ED visits in the last 6 months. Most have related to COPD. Discussed case with medical advisor, who approved HRI. [facility] SOC will be within 24 hours of DC.   Action/Plan:  Will be HRI w [facility] Watch for O2 needs.  Expected Discharge Date:                  Expected Discharge Plan:  Home w Home Health Services  In-House Referral:     Discharge planning Services  CM Consult  Post Acute Care Choice:  Home Health, Resumption of Svcs/PTA Provider Choice offered to:     DME Arranged:    DME Agency:     HH Arranged:    HH Agency:     Status of Service:  In process, will continue to follow  If discussed at Long Length of Stay Meetings, dates discussed:    Additional Comments:  Lawerance SabalDebbie Orian Figueira, RN 12/23/2017, 10:38 AM

## 2017-12-23 NOTE — Progress Notes (Signed)
PROGRESS NOTE  David Duke  ZOX:[id number] DOB: [birth year] DOA: 12/22/2017 PCP: [name], MD   Brief Narrative: David Duke is an 83 y.o. male with a history of tobacco use, COPD, stage III CKD, anxiety, BPH with chronic indwelling foley catheter, hemorrhagic CVA with vascular dementia, and recurrent admissions who presented to the ED from home with recurrent cough and dyspnea. After recent discharge for COPD exacerbation, his wife reports he had gone back to smoking which preceded these current symptoms including cough, dyspnea, respiratory distress and increasing sputum production. EMS administered IV steroid, duonebs. Work up in ED showed pyuria, negative CXR, WBC 15k. Treatment for acute COPD exacerbation was continued and he was admitted 4/16.   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  - Respiratory therapy  Tobacco use:  - Cessation counseling provided to both patient and wife, as this is precipitant of exacerbation.  - Nicotine patch ordered.   Pseudomonas CAUTI: Hx of pseudomonas which was pansensitive and enterococcus.  - Started on empiric meropenem, will continue for now (last was sensitive to imipenem) - Monitor urine culture - Foley changed out after voiding trial failed 4/17.   Stage III CKD: At baseline.  - Renally dose medications, avoid nephrotoxins.   BPH:  - Continue finasteride, foley  History of hemorrhagic CVA with residual ataxia and vascular dementia:  - With increasing utilization of healthcare resources, increasing caregiver burden, I strongly suspect SNF at  discharge would be the safest and most wise disposition. PT/OT consulted.   Anxiety: Stable.  - Reorder home medications  DVT prophylaxis: Lovenox Code Status: Full Family Communication: Wife on phone Disposition Plan: TBD  Consultants:   None  Procedures:   Foley replaced 12/23/2017  Antimicrobials:  Meropenem 4/16 >>    Subjective: Breathing is better from admission by a significant amount per both pt and his wife. Refused nebs this AM. No chest pain, fever.   Objective: BP (!) 110/59 (BP Location: Right Arm)   Pulse 87   Temp 98.1 F (36.7 C) (Oral)   Resp 18   Ht 5\' 8"  (1.727 m)   Wt 62.7 kg (138 lb 4.8 oz)   SpO2 97%   BMI 21.03 kg/m   Gen: Unkempt elderly male in no distress Pulm: Non-labored with inspiratory and expiratory wheezing without prolongation of expiration CV: Regular rate and rhythm. No murmur, rub, or gallop. No JVD, no pedal edema. GI: Abdomen soft, non-tender, non-distended, with normoactive bowel sounds. No organomegaly or masses felt. Ext: Warm, no deformities Skin: No rashes, lesions no ulcers Neuro: Alert and oriented. No focal neurological deficits. Psych: Judgement and insight appear normal. Mood & affect appropriate.   Data Reviewed: I have personally reviewed following labs and imaging studies  CBC: Recent Labs  Lab 12/22/17 1444 12/23/17 0439  WBC 15.8* 9.5  HGB 14.8 12.5*  HCT 43.8 38.1*  MCV 90.9 91.8  PLT 222 217   Basic Metabolic Panel: Recent Labs  Lab 12/22/17 1444 12/23/17 0439  NA 139 140  K 4.0 4.4  CL 103 107  CO2 25 23  GLUCOSE 126* 121*  BUN 10 15  CREATININE 1.53* 1.48*  CALCIUM 9.0 8.7*   Urine analysis:    Component Value Date/Time   COLORURINE YELLOW 12/22/2017 1707   APPEARANCEUR CLOUDY (A) 12/22/2017 1707   LABSPEC 1.009 12/22/2017 1707   PHURINE 7.0 12/22/2017 1707   GLUCOSEU NEGATIVE 12/22/2017 1707   HGBUR SMALL (A) 12/22/2017 1707   BILIRUBINUR NEGATIVE 12/22/2017 1707   KETONESUR 20  (A) 12/22/2017 1707   PROTEINUR NEGATIVE 12/22/2017 1707   UROBILINOGEN 0.2 03/31/2015 1850   NITRITE NEGATIVE 12/22/2017 1707   LEUKOCYTESUR LARGE (A) 12/22/2017 1707   Recent Results (from the past 240 hour(s))  Urine culture     Status: Abnormal (Preliminary result)   Collection Time: 12/22/17  7:09 PM  Result Value Ref Range Status   Specimen Description URINE, CATHETERIZED  Final   Special Requests NONE  Final   Culture (A)  Final    >=100,000 COLONIES/mL PSEUDOMONAS AERUGINOSA SUSCEPTIBILITIES TO FOLLOW Performed at [facility], [address] [place], [name] [address]    Report Status PENDING  Incomplete      Radiology Studies: Dg Chest 2 View  Result Date: 12/22/2017 CLINICAL DATA:  Cough and weakness for 2 days, history stroke, COPD, fell last week, trouble walking, smoker EXAM: CHEST - 2 VIEW COMPARISON:  11/30/2017 FINDINGS: Normal heart size, mediastinal contours, and pulmonary vascularity. Lungs hyperinflated but clear. Minimal central peribronchial thickening. No acute infiltrate, pleural effusion or pneumothorax. Bones demineralized. IMPRESSION: Hyperinflation and central peribronchial thickening question COPD. No acute infiltrate. Electronically Signed   By: Ulyses SouthwardMark  Boles M.D.   On: 12/22/2017 16:27    Time spent: 25 minutes.  Hazeline Junkeryan Cheyann Blecha, MD [facility] Pager [phone]  If 7PM-7AM, please contact night-coverage [email or web address] Password [email or web address] 12/23/2017, 3:07 PM

## 2017-12-23 NOTE — Progress Notes (Signed)
Nutrition Brief Note  RD consulted for assessment of nutritional needs/status per COPD/GOLD protocol.   Wt Readings from Last 15 Encounters:  12/23/17 138 lb 4.8 oz (62.7 kg)  11/30/17 137 lb 9.1 oz (62.4 kg)  11/24/17 139 lb 12.3 oz (63.4 kg)  11/21/17 139 lb 8.8 oz (63.3 kg)  09/18/17 136 lb (61.7 kg)  09/07/17 136 lb 11 oz (62 kg)  08/26/17 140 lb (63.5 kg)  08/23/17 140 lb (63.5 kg)  08/11/17 140 lb (63.5 kg)  06/28/17 140 lb (63.5 kg)  06/21/17 140 lb (63.5 kg)  05/23/17 140 lb (63.5 kg)  05/15/17 140 lb 5 oz (63.6 kg)  03/28/17 129 lb 13.6 oz (58.9 kg)  03/24/17 130 lb (59 kg)   David BarlowDavid R Duke is a 83 y.o. male with medical history significant of COPD, ICH residual ataxia and vascular dementia, chronic indwelling foley with recurrent UTIs.  Pt admitted with COPD exacerbation.   Spoke with pt at wife at bedside. Pt reports great appetite, consumed all of his breakfast this morning. He usually consumes 3 meals per day (light breakfast of eggs and pastry, Lunch and Dinner: meat, starch, and vegetable). Pt reports his wife is "a great cook" and he consumes soft consistency foods due to lack of upper teeth (pt waiting for appointment to replace dentures).   Pt denies any weight loss. He reports UBW around 140#.   Nutrition-Focused physical exam completed. Findings are no fat depletion, no muscle depletion, and no edema.   Body mass index is 21.03 kg/m. Patient meets criteria for normal weight range based on current BMI.   Current diet order is Heart Healthy, patient is consuming approximately 100% of meals at this time. Labs and medications reviewed.   No nutrition interventions warranted at this time. If nutrition issues arise, please consult RD.   David Duke, RD, LDN, CDE Pager: [phone] After hours Pager: [phone]

## 2017-12-23 NOTE — Progress Notes (Signed)
Voiding trial attempted. Pt with chronic catheter use due to urinary retention. 14Fr foley catheter inserted after bladder scan revealed urine and pt with abdminal pain. Will continue to monitor.

## 2017-12-23 NOTE — Evaluation (Signed)
Physical Therapy Evaluation Patient Details Name: David Duke MRN: [id number] DOB: [birth year] Today's Date: 12/23/2017   History of Present Illness  Pt is an 83 y.o. male admitted 12/22/17 with recurrent cough and dyspnea; worked up for COPD exacerbation. 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    Clinical Impression  Pt presents with an overall decrease in functional mobility secondary to above. PTA, pt indep with amb and ADLs; lives with wife who provides 24/7 supervision and PRN assist. Today, pt initially attempting to amb with no UE support requiring minA to prevent LOB due to instability and ataxic gait; amb an additional 30' with RW and close min guard for balance. Discussed recommendation for short-term SNF-level therapies to maximize functional mobility and independence; wife very supportive of this idea. Will follow acutely to address established goals.     Follow Up Recommendations SNF;Supervision for mobility/OOB    Equipment Recommendations  None recommended by PT    Recommendations for Other Services       Precautions / Restrictions Precautions Precautions: Fall Restrictions Weight Bearing Restrictions: No      Mobility  Bed Mobility Overal bed mobility: Modified Independent             General bed mobility comments: Increased time and effort. Pt with intention tremors (?) in BUEs/BLEs while getting to EOB  Transfers Overall transfer level: Needs assistance Equipment used: None;Rolling walker (2 wheeled) Transfers: Sit to/from Stand Sit to Stand: Min assist;Min guard         General transfer comment: Stood with no DME and close min guard; posterior LOB to EOB requiring minA to control. Min guard sit<>stand again with RW  Ambulation/Gait Ambulation/Gait assistance: Min guard Ambulation Distance (Feet): 30 Feet Assistive device:  Rolling walker (2 wheeled) Gait Pattern/deviations: Step-through pattern;Decreased stride length;Trunk flexed;Narrow base of support;Ataxic Gait velocity: Decreased   General Gait Details: Slow, unsteady amb with RW and close min guard for balance. Apparent BLE ataxia  Stairs            Wheelchair Mobility    Modified Rankin (Stroke Patients Only)       Balance Overall balance assessment: Needs assistance Sitting-balance support: No upper extremity supported Sitting balance-Leahy Scale: Good Sitting balance - Comments: Able to don shoes sitting EOB   Standing balance support: No upper extremity supported Standing balance-Leahy Scale: Poor Standing balance comment: LOB with no UE support; stability improved with BUEs support                             Pertinent Vitals/Pain Pain Assessment: No/denies pain    Home Living Family/patient expects to be discharged to:: Private residence Living Arrangements: Spouse/significant other Available Help at Discharge: Family;Available 24 hours/day Type of Home: House Home Access: Stairs to enter Entrance Stairs-Rails: Right;Left;Can reach both Entrance Stairs-Number of Steps: 4 Home Layout: One level Home Equipment: Emergency planning/management officer - 2 wheels Additional Comments: Wife provides 24/7 supervision    Prior Function Level of Independence: Needs assistance   Gait / Transfers Assistance Needed: Indep with ambulation; notes worsening "shaking" and weakness over past few days. Has had a couple falls at home recently  ADL's / Homemaking Assistance Needed: Reports indep with ADLs; wife provides intermittent assist        Hand Dominance        Extremity/Trunk Assessment   Upper Extremity Assessment  Upper Extremity Assessment: Overall WFL for tasks assessed(intention tremors?)    Lower Extremity Assessment Lower Extremity Assessment: Overall WFL for tasks assessed(intention tremors?)    Cervical / Trunk  Assessment Cervical / Trunk Assessment: Kyphotic  Communication   Communication: HOH  Cognition Arousal/Alertness: Awake/alert Behavior During Therapy: Anxious Overall Cognitive Status: History of cognitive impairments - at baseline Area of Impairment: Attention;Memory;Following commands;Safety/judgement;Awareness;Problem solving                   Current Attention Level: Sustained Memory: Decreased short-term memory Following Commands: Follows one step commands consistently;Follows multi-step commands inconsistently Safety/Judgement: Decreased awareness of deficits;Decreased awareness of safety Awareness: Emergent Problem Solving: Slow processing;Difficulty sequencing;Requires verbal cues;Requires tactile cues General Comments: Pt with h/o dementia. Wife reports she has noticed to decline in cognition over past weeks (did not expand on details). Pt with decreased attention and slowed processing, requiring cues to stay on task. Becoming more agitated by end of session, both pt and wife reporting it was time for his dementia medicaiton Banker(RN notified)      General Comments General comments (skin integrity, edema, etc.): Wife present throughout session    Exercises     Assessment/Plan    PT Assessment Patient needs continued PT services  PT Problem List Decreased strength;Decreased mobility;Decreased safety awareness;Decreased coordination;Decreased activity tolerance;Decreased balance;Decreased knowledge of use of DME       PT Treatment Interventions Therapeutic activities;Therapeutic exercise;Gait training;Patient/family education;Balance training;Neuromuscular re-education;Functional mobility training;DME instruction;Stair training    PT Goals (Current goals can be found in the Care Plan section)  Acute Rehab PT Goals Patient Stated Goal: Feel stronger and less shaking PT Goal Formulation: With patient Time For Goal Achievement: 01/06/18 Potential to Achieve Goals:  Good    Frequency Min 2X/week   Barriers to discharge        Co-evaluation               AM-PAC PT "6 Clicks" Daily Activity  Outcome Measure Difficulty turning over in bed (including adjusting bedclothes, sheets and blankets)?: A Little Difficulty moving from lying on back to sitting on the side of the bed? : A Little Difficulty sitting down on and standing up from a chair with arms (e.g., wheelchair, bedside commode, etc,.)?: Unable Help needed moving to and from a bed to chair (including a wheelchair)?: A Little Help needed walking in hospital room?: A Little Help needed climbing 3-5 steps with a railing? : A Lot 6 Click Score: 15    End of Session Equipment Utilized During Treatment: Gait belt Activity Tolerance: Patient tolerated treatment well Patient left: in bed;with call bell/phone within reach;with family/visitor present;with bed alarm set Nurse Communication: Mobility status PT Visit Diagnosis: Other abnormalities of gait and mobility (R26.89);Unsteadiness on feet (R26.81)    Time: [id number] PT Time Calculation (min) (ACUTE ONLY): 32 min   Charges:   PT Evaluation $PT Eval Moderate Complexity: 1 Mod PT Treatments $Therapeutic Activity: 8-22 mins   PT G Codes:       Ina HomesJaclyn Natassia Guthridge, PT, DPT Acute Rehab Services  Pager: [phone]  Malachy ChamberJaclyn L Glorya Bartley 12/23/2017, 4:48 PM

## 2017-12-23 NOTE — Progress Notes (Signed)
PHARMACY - PHYSICIAN COMMUNICATION CRITICAL VALUE ALERT - BLOOD CULTURE IDENTIFICATION (BCID)  Marcell BarlowDavid R Wilinski is an 83 y.o. male who [facility] with a chief complaint of cough and SOB  Assessment: 83 yo M presents with cough and SOB. Found to have CAUTI. Pseudomonas growing in urine. Now 1/4 blood cx positive with staph species. Probable contaminant  Name of physician (or Provider) Contacted: Donnamarie PoagK. Kirby  Current antibiotics: Merrem  Changes to prescribed antibiotics recommended:  No changes to current abx needed  Results for orders placed or performed during the hospital encounter of 12/22/17  Blood Culture ID Panel (Reflexed) (Collected: 12/22/2017  7:00 PM)  Result Value Ref Range   Enterococcus species NOT DETECTED NOT DETECTED   Listeria monocytogenes NOT DETECTED NOT DETECTED   Staphylococcus species DETECTED (A) NOT DETECTED   Staphylococcus aureus NOT DETECTED NOT DETECTED   Methicillin resistance NOT DETECTED NOT DETECTED   Streptococcus species NOT DETECTED NOT DETECTED   Streptococcus agalactiae NOT DETECTED NOT DETECTED   Streptococcus pneumoniae NOT DETECTED NOT DETECTED   Streptococcus pyogenes NOT DETECTED NOT DETECTED   Acinetobacter baumannii NOT DETECTED NOT DETECTED   Enterobacteriaceae species NOT DETECTED NOT DETECTED   Enterobacter cloacae complex NOT DETECTED NOT DETECTED   Escherichia coli NOT DETECTED NOT DETECTED   Klebsiella oxytoca NOT DETECTED NOT DETECTED   Klebsiella pneumoniae NOT DETECTED NOT DETECTED   Proteus species NOT DETECTED NOT DETECTED   Serratia marcescens NOT DETECTED NOT DETECTED   Haemophilus influenzae NOT DETECTED NOT DETECTED   Neisseria meningitidis NOT DETECTED NOT DETECTED   Pseudomonas aeruginosa NOT DETECTED NOT DETECTED   Candida albicans NOT DETECTED NOT DETECTED   Candida glabrata NOT DETECTED NOT DETECTED   Candida krusei NOT DETECTED NOT DETECTED   Candida parapsilosis NOT DETECTED NOT  DETECTED   Candida tropicalis NOT DETECTED NOT DETECTED    Armandina StammerBATCHELDER,Alvena Kiernan J 12/23/2017  10:21 PM

## 2017-12-24 MED ORDER — CIPROFLOXACIN HCL 500 MG PO TABS
250.0000 mg | ORAL_TABLET | Freq: Two times a day (BID) | ORAL | Status: DC
  Administered 2017-12-24 – 2017-12-25 (×3): 250 mg via ORAL
  Filled 2017-12-24 (×3): qty 1

## 2017-12-24 NOTE — Evaluation (Signed)
Occupational Therapy Evaluation Patient Details Name: David Duke MRN: [id number] DOB: 05/18/1935 Today's Date: 12/24/2017    History of Present Illness Pt is an 83 y.o. male admitted 12/22/17 with recurrent cough and dyspnea; worked up for COPD exacerbation. 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   Clinical Impression   PTA Pt mod I for ADL (sits for grooming, wife assists PRN), and mod I for mobility with RW. Pt is currently requiring increased assist, decreased activity tolerance and balance and decreased cognition. Pt will require continued skilled OT in the acute setting and afterwards at the SNF level to maximize safety and independence in ADL and functional transfers. Next session to focus on standing balance for ADL, sit to stand for transfer.    Follow Up Recommendations  SNF;Supervision/Assistance - 24 hour    Equipment Recommendations  Other (comment)(defer to next venue of care)    Recommendations for Other Services       Precautions / Restrictions Precautions Precautions: Fall Restrictions Weight Bearing Restrictions: No      Mobility Bed Mobility Overal bed mobility: Needs Assistance Bed Mobility: Supine to Sit     Supine to sit: Supervision     General bed mobility comments: supervision for safety, increased time and effort  Transfers Overall transfer level: Needs assistance Equipment used: Rolling walker (2 wheeled) Transfers: Sit to/from Stand Sit to Stand: Min guard         General transfer comment: increased time, cueing for safe hand placement, min guard for safety with transition into standing from EOB    Balance Overall balance assessment: Needs assistance Sitting-balance support: No upper extremity supported Sitting balance-Leahy Scale: Good     Standing balance support: During functional activity;Bilateral upper  extremity supported Standing balance-Leahy Scale: Poor                             ADL either performed or assessed with clinical judgement   ADL Overall ADL's : Needs assistance/impaired Eating/Feeding: Independent   Grooming: Min guard;Minimal assistance;Standing;Wash/dry hands Grooming Details (indicate cue type and reason): leans against sink Upper Body Bathing: Min guard   Lower Body Bathing: Min guard;Sitting/lateral leans   Upper Body Dressing : Set up;Sitting   Lower Body Dressing: Min guard;Minimal assistance;Sit to/from stand Lower Body Dressing Details (indicate cue type and reason): able to don socks at bed level (excellent flexibility), min guard asssit for items that require sit to stand Toilet Transfer: Min guard;Ambulation;RW Toilet Transfer Details (indicate cue type and reason): simulated - Pt has catheter in place Toileting- Clothing Manipulation and Hygiene: Min guard;Sit to/from stand       Functional mobility during ADLs: Min guard;Minimal assistance;Rolling walker;Cueing for safety       Vision Baseline Vision/History: Legally blind(R eye)       Perception     Praxis      Pertinent Vitals/Pain Pain Assessment: No/denies pain     Hand Dominance Right   Extremity/Trunk Assessment Upper Extremity Assessment Upper Extremity Assessment: Overall WFL for tasks assessed   Lower Extremity Assessment Lower Extremity Assessment: Overall WFL for tasks assessed   Cervical / Trunk Assessment Cervical / Trunk Assessment: Kyphotic   Communication Communication Communication: HOH   Cognition Arousal/Alertness: Awake/alert Behavior During Therapy: WFL for tasks assessed/performed Overall Cognitive Status: No family/caregiver present to determine baseline cognitive functioning Area of Impairment: Attention;Following commands;Safety/judgement;Problem solving  Current Attention Level: Sustained   Following Commands:  Follows one step commands consistently;Follows multi-step commands inconsistently Safety/Judgement: Decreased awareness of deficits;Decreased awareness of safety   Problem Solving: Difficulty sequencing;Requires verbal cues General Comments: h/o dementia, wife not present this session   General Comments       Exercises     Shoulder Instructions      Home Living Family/patient expects to be discharged to:: Private residence Living Arrangements: Spouse/significant other Available Help at Discharge: Family;Available 24 hours/day Type of Home: House Home Access: Stairs to enter Entergy CorporationEntrance Stairs-Number of Steps: 4 Entrance Stairs-Rails: Right;Left;Can reach both Home Layout: One level     Bathroom Shower/Tub: Producer, television/film/videoWalk-in shower   Bathroom Toilet: Standard Bathroom Accessibility: Yes How Accessible: Accessible via walker Home Equipment: Emergency planning/management officerhower seat;Walker - 2 wheels   Additional Comments: Wife provides 24/7 supervision      Prior Functioning/Environment Level of Independence: Needs assistance  Gait / Transfers Assistance Needed: Indep with ambulation; notes worsening "shaking" and weakness over past few days. Has had a couple falls at home recently ADL's / Homemaking Assistance Needed: Reports indep with ADLs; wife provides intermittent assist   Comments: retired Nurse, adultpoliceman        OT Problem List: Decreased activity tolerance;Impaired balance (sitting and/or standing);Decreased cognition;Decreased safety awareness      OT Treatment/Interventions: Self-care/ADL training;DME and/or AE instruction;Therapeutic activities;Patient/family education;Balance training    OT Goals(Current goals can be found in the care plan section) Acute Rehab OT Goals Patient Stated Goal: Feel stronger and less shaking OT Goal Formulation: With patient Time For Goal Achievement: 01/07/18 Potential to Achieve Goals: Good ADL Goals Pt Will Perform Grooming: with modified independence;sitting Pt Will  Transfer to Toilet: with supervision;ambulating Pt Will Perform Toileting - Clothing Manipulation and hygiene: sitting/lateral leans;with modified independence  OT Frequency: Min 2X/week   Barriers to D/C:            Co-evaluation              AM-PAC PT "6 Clicks" Daily Activity     Outcome Measure Help from another person eating meals?: None Help from another person taking care of personal grooming?: A Little Help from another person toileting, which includes using toliet, bedpan, or urinal?: A Little Help from another person bathing (including washing, rinsing, drying)?: A Little Help from another person to put on and taking off regular upper body clothing?: A Little Help from another person to put on and taking off regular lower body clothing?: A Little 6 Click Score: 19   End of Session Equipment Utilized During Treatment: Gait belt;Rolling walker;Oxygen(2L) Nurse Communication: Mobility status  Activity Tolerance: Patient tolerated treatment well Patient left: in bed;with call bell/phone within reach;with bed alarm set  OT Visit Diagnosis: Unsteadiness on feet (R26.81);Other abnormalities of gait and mobility (R26.89);Muscle weakness (generalized) (M62.81)                Time:  -    Charges:  OT General Charges $OT Visit: 1 Visit OT Evaluation $OT Eval Moderate Complexity: 1 Mod OT Treatments $Self Care/Home Management : 8-22 mins G-Codes:     Sherryl MangesLaura Jeremy Ditullio OTR/L [phone]  Evern BioLaura J Rosan Calbert 12/24/2017, 5:30 PM

## 2017-12-24 NOTE — Progress Notes (Signed)
Physical Therapy Treatment Patient Details Name: David Duke MRN: [id number] DOB: 1935-05-11 Today's Date: 12/24/2017    History of Present Illness Pt is an 83 y.o. male admitted 12/22/17 with recurrent cough and dyspnea; worked up for COPD exacerbation. 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    PT Comments    Pt making steady progress with functional mobility. He continues to demonstrate instability, poor coordination and ataxia in all extremities with purposeful movements. Pt tolerated short distance ambulation within his room with RW and close min guard for safety. Pt would continue to benefit from skilled physical therapy services at this time while admitted and after d/c to address the below listed limitations in order to improve overall safety and independence with functional mobility.    Follow Up Recommendations  SNF;Supervision for mobility/OOB     Equipment Recommendations  None recommended by PT    Recommendations for Other Services       Precautions / Restrictions Precautions Precautions: Fall Restrictions Weight Bearing Restrictions: No    Mobility  Bed Mobility Overal bed mobility: Needs Assistance Bed Mobility: Supine to Sit     Supine to sit: Supervision     General bed mobility comments: supervision for safety, increased time and effort  Transfers Overall transfer level: Needs assistance Equipment used: Rolling walker (2 wheeled) Transfers: Sit to/from Stand Sit to Stand: Min guard         General transfer comment: increased time, cueing for safe hand placement, min guard for safety with transition into standing from EOB  Ambulation/Gait Ambulation/Gait assistance: Min guard Ambulation Distance (Feet): 30 Feet Assistive device: Rolling walker (2 wheeled) Gait Pattern/deviations: Step-through pattern;Decreased stride  length;Trunk flexed;Narrow base of support;Ataxic Gait velocity: Decreased Gait velocity interpretation: <1.8 ft/sec, indicate of risk for recurrent falls General Gait Details: slow, unsteady gait with RW, close min guard for safety; pt with very narrow BoS and flexed posture. pt required frequent redirection to maintain attention to task   Stairs             Wheelchair Mobility    Modified Rankin (Stroke Patients Only)       Balance Overall balance assessment: Needs assistance Sitting-balance support: No upper extremity supported Sitting balance-Leahy Scale: Good     Standing balance support: During functional activity;Bilateral upper extremity supported Standing balance-Leahy Scale: Poor                              Cognition Arousal/Alertness: Awake/alert Behavior During Therapy: WFL for tasks assessed/performed Overall Cognitive Status: History of cognitive impairments - at baseline Area of Impairment: Attention;Following commands;Safety/judgement;Problem solving                   Current Attention Level: Sustained   Following Commands: Follows one step commands consistently;Follows multi-step commands inconsistently Safety/Judgement: Decreased awareness of deficits;Decreased awareness of safety   Problem Solving: Difficulty sequencing;Requires verbal cues        Exercises      General Comments        Pertinent Vitals/Pain Pain Assessment: No/denies pain    Home Living                      Prior Function            PT Goals (current goals can now be found in the care plan section)  Acute Rehab PT Goals PT Goal Formulation: With patient Time For Goal Achievement: 01/06/18 Potential to Achieve Goals: Good Progress towards PT goals: Progressing toward goals    Frequency    Min 2X/week      PT Plan Current plan remains appropriate    Co-evaluation              AM-PAC PT "6 Clicks" Daily Activity   Outcome Measure  Difficulty turning over in bed (including adjusting bedclothes, sheets and blankets)?: None Difficulty moving from lying on back to sitting on the side of the bed? : None Difficulty sitting down on and standing up from a chair with arms (e.g., wheelchair, bedside commode, etc,.)?: Unable Help needed moving to and from a bed to chair (including a wheelchair)?: A Little Help needed walking in hospital room?: A Little Help needed climbing 3-5 steps with a railing? : A Little 6 Click Score: 18    End of Session Equipment Utilized During Treatment: Gait belt Activity Tolerance: Patient tolerated treatment well Patient left: in chair;with call bell/phone within reach Nurse Communication: Mobility status PT Visit Diagnosis: Other abnormalities of gait and mobility (R26.89);Unsteadiness on feet (R26.81)     Time: 0935-1010 PT Time Calculation (min) (ACUTE ONLY): 35 min  Charges:  $Gait Training: 8-22 mins $Therapeutic Activity: 8-22 mins                    G Codes:       BisonJennifer Annette Liotta, [place]PT, [place]DPT [phone]    [name] 12/24/2017, 11:18 AM

## 2017-12-24 NOTE — Progress Notes (Signed)
PROGRESS NOTE  CECILE GUEVARA  VQQ:[id number] DOB: Apr 20, 1935 DOA: 12/22/2017 PCP: [name], MD   Brief Narrative: David Duke is an 83 y.o. male with a history of tobacco use, COPD, stage III CKD, anxiety, BPH with chronic indwelling foley catheter, hemorrhagic CVA with vascular dementia, and recurrent admissions who presented to the ED from home with recurrent cough and dyspnea. After recent discharge for COPD exacerbation, his wife reports he had gone back to smoking which preceded these current symptoms including cough, dyspnea, respiratory distress and increasing sputum production. EMS administered IV steroid, duonebs. Work up in ED showed pyuria, negative CXR, WBC 15k. Treatment for acute COPD exacerbation was continued and he was admitted 4/16. Oxygen supplementation was weaned and respiratory status is improving. Urine culture has grown pseudomonas which is being tredated with ciprofloxacin (initially meropenem).   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 - Continue scheduled and prn bronchodilators.  - Respiratory therapy assistance appreciated.   Tobacco use:  - Cessation counseling provided to both patient and wife, as this is precipitant of exacerbation.  - Nicotine patch ordered.   Pseudomonas CAUTI: Hx of pseudomonas which was pansensitive and enterococcus.  - Started on empiric meropenem, will change to ciprofloxacin today based on susceptibilities. Appreciate pharmacy assistance with renal dosing.  - Foley changed out after voiding trial failed 4/17.   Stage III CKD: At baseline.  - Renally dose medications,  avoid nephrotoxins.   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 This was recommended by PT, CSW has been consulted.  - Dysphagia 3 diet.   Anxiety: Stable.  - Reorder home medications. This is contributing to respiratory symptoms intermittently.   DVT prophylaxis: Lovenox Code Status: Full Family Communication: None at bedside this AM.  Disposition Plan: SNF when bed available.   Consultants:   None  Procedures:   Foley replaced 12/23/2017  Antimicrobials:  Meropenem 4/16 - 4/18  Ciprofloxacin 4/18 >>   Subjective: Had some wheezing this morning, but states is overall improving. Felt more short of breath and weak with ambulation yesterday. Denies chest pain, fever.   Objective: BP (!) 149/85 (BP Location: Right Arm)   Pulse 74   Temp 97.6 F (36.4 C) (Oral)   Resp 18   Ht 5\' 8"  (1.727 m)   Wt 61.9 kg (136 lb 6.4 oz) Comment: b scale  SpO2 97%   BMI 20.74 kg/m   Gen: Frustrated but pleasant elderly male in no distress Pulm: Non-labored on room air, after respiratory treatment has expiratory wheezing without prolongation of expiration CV: Regular rate and rhythm. No murmur, rub, or gallop. No JVD, no pedal edema. GI: Abdomen soft, non-tender, non-distended, with normoactive bowel sounds. No organomegaly or masses felt. Ext: Warm, no deformities Skin: No rashes, lesions no ulcers Neuro: Alert, oriented but forgetful with impaired short term recall. +Ataxia, so gait not assessed today. No focal neurological weakness.  Psych: Judgement and insight appear fair, but impaired by cognitive dysfunction. Mood anxious & affect appropriate.  Data Reviewed: I have  personally reviewed following labs and imaging studies  CBC: Recent Labs  Lab 12/22/17 1444 12/23/17 0439  WBC 15.8* 9.5  HGB 14.8 12.5*  HCT 43.8 38.1*  MCV 90.9 91.8  PLT 222 217   Basic Metabolic Panel: Recent Labs  Lab 12/22/17 1444 12/23/17 0439  NA 139 140  K 4.0 4.4  CL 103 107  CO2 25 23  GLUCOSE 126* 121*  BUN 10 15  CREATININE 1.53* 1.48*  CALCIUM 9.0 8.7*   Urine analysis:    Component Value Date/Time   COLORURINE YELLOW 12/22/2017 1707   APPEARANCEUR CLOUDY (A) 12/22/2017 1707   LABSPEC 1.009 12/22/2017 1707   PHURINE 7.0 12/22/2017 1707   GLUCOSEU NEGATIVE 12/22/2017 1707   HGBUR SMALL (A) 12/22/2017 1707   BILIRUBINUR NEGATIVE 12/22/2017 1707   KETONESUR 20 (A) 12/22/2017 1707   PROTEINUR NEGATIVE 12/22/2017 1707   UROBILINOGEN 0.2 03/31/2015 1850   NITRITE NEGATIVE 12/22/2017 1707   LEUKOCYTESUR LARGE (A) 12/22/2017 1707   Recent Results (from the past 240 hour(s))  Culture, blood (routine x 2)     Status: None (Preliminary result)   Collection Time: 12/22/17  7:00 PM  Result Value Ref Range Status   Specimen Description BLOOD LEFT ANTECUBITAL  Final   Special Requests   Final    BOTTLES DRAWN AEROBIC AND ANAEROBIC Blood Culture results may not be optimal due to an excessive volume of blood received in culture bottles   Culture  Setup Time   Final    GRAM POSITIVE COCCI AEROBIC BOTTLE ONLY Organism ID to follow CRITICAL RESULT CALLED TO, READ BACK BY AND VERIFIED WITHPatrick North [facility] 2213 12/23/17 A BROWNING Performed at [facility], [address], [place], [place] [address]    Culture PENDING  Incomplete   Report Status PENDING  Incomplete  Blood Culture ID Panel (Reflexed)     Status: Abnormal   Collection Time: 12/22/17  7:00 PM  Result Value Ref Range Status   Enterococcus species NOT DETECTED NOT DETECTED Final   Listeria monocytogenes NOT DETECTED NOT DETECTED Final   Staphylococcus species DETECTED (A) NOT DETECTED  Final    Comment: Methicillin (oxacillin) susceptible coagulase negative staphylococcus. Possible blood culture contaminant (unless isolated from more than one blood culture draw or clinical case suggests pathogenicity). No antibiotic treatment is indicated for blood  culture contaminants. CRITICAL RESULT CALLED TO, READ BACK BY AND VERIFIED WITH: Patrick North PHARMD 2213 12/23/17 A BROWNING    Staphylococcus aureus NOT DETECTED NOT DETECTED Final   Methicillin resistance NOT DETECTED NOT DETECTED Final   Streptococcus species NOT DETECTED NOT DETECTED Final   Streptococcus agalactiae NOT DETECTED NOT DETECTED Final   Streptococcus pneumoniae NOT DETECTED NOT DETECTED Final   Streptococcus pyogenes NOT DETECTED NOT DETECTED Final   Acinetobacter baumannii NOT DETECTED NOT DETECTED Final   Enterobacteriaceae species NOT DETECTED NOT DETECTED Final   Enterobacter cloacae complex NOT DETECTED NOT DETECTED Final   Escherichia coli NOT DETECTED NOT DETECTED Final   Klebsiella oxytoca NOT DETECTED NOT DETECTED Final   Klebsiella pneumoniae NOT DETECTED NOT DETECTED Final   Proteus species NOT DETECTED NOT DETECTED Final   Serratia marcescens NOT DETECTED NOT DETECTED Final   Haemophilus influenzae NOT DETECTED NOT DETECTED Final   Neisseria meningitidis NOT DETECTED NOT DETECTED Final   Pseudomonas aeruginosa NOT DETECTED NOT DETECTED Final  Candida albicans NOT DETECTED NOT DETECTED Final   Candida glabrata NOT DETECTED NOT DETECTED Final   Candida krusei NOT DETECTED NOT DETECTED Final   Candida parapsilosis NOT DETECTED NOT DETECTED Final   Candida tropicalis NOT DETECTED NOT DETECTED Final    Comment: Performed at [facility], [address] [place], [name] [address]  Urine culture     Status: Abnormal (Preliminary result)   Collection Time: 12/22/17  7:09 PM  Result Value Ref Range Status   Specimen Description URINE, CATHETERIZED  Final   Special Requests NONE  Final    Culture (A)  Final    >=100,000 COLONIES/mL PSEUDOMONAS AERUGINOSA CULTURE REINCUBATED FOR BETTER GROWTH Performed at [facility], [address] [place], [name] [address]    Report Status PENDING  Incomplete   Organism ID, Bacteria PSEUDOMONAS AERUGINOSA (A)  Final      Susceptibility   Pseudomonas aeruginosa - MIC*    CEFTAZIDIME 4 SENSITIVE Sensitive     CIPROFLOXACIN <=0.25 SENSITIVE Sensitive     GENTAMICIN 2 SENSITIVE Sensitive     IMIPENEM 2 SENSITIVE Sensitive     PIP/TAZO 8 SENSITIVE Sensitive     CEFEPIME 2 SENSITIVE Sensitive     * >=100,000 COLONIES/mL PSEUDOMONAS AERUGINOSA  Culture, blood (routine x 2)     Status: None (Preliminary result)   Collection Time: 12/22/17  7:40 PM  Result Value Ref Range Status   Specimen Description BLOOD RIGHT HAND  Final   Special Requests   Final    BOTTLES DRAWN AEROBIC AND ANAEROBIC Blood Culture adequate volume   Culture   Final    NO GROWTH < 24 HOURS Performed at [facility], [address] [place], [name] [address]    Report Status PENDING  Incomplete      Radiology Studies: Dg Chest 2 View  Result Date: 12/22/2017 CLINICAL DATA:  Cough and weakness for 2 days, history stroke, COPD, fell last week, trouble walking, smoker EXAM: CHEST - 2 VIEW COMPARISON:  11/30/2017 FINDINGS: Normal heart size, mediastinal contours, and pulmonary vascularity. Lungs hyperinflated but clear. Minimal central peribronchial thickening. No acute infiltrate, pleural effusion or pneumothorax. Bones demineralized. IMPRESSION: Hyperinflation and central peribronchial thickening question COPD. No acute infiltrate. Electronically Signed   By: Ulyses SouthwardMark  Boles M.D.   On: 12/22/2017 16:27    Time spent: 25 minutes.  Hazeline Junkeryan Breckon Reeves, MD [facility] Pager [phone]  If 7PM-7AM, please contact night-coverage [email or web address] Password [email or web address] 12/24/2017, 8:05 AM

## 2017-12-24 NOTE — Progress Notes (Signed)
Pharmacy Antibiotic Note  David BarlowDavid R Duke is a 83 y.o. male on day # 3 Meropenem for UTI. Urine culture grew Pseudomonas.  To change to PO Cipro.  Chronic foley. Also had UTI with Pseudomonas and low-colony count Enterococcus in March.  1 of 2 blood cx with Coag negative Staph, probable contaminant. Treated with Cipro IV>Cefepime x 2 doses>oral Cipro at discharge.    Afebrile, WBC down to 9.5 on 4/17, on day 2 of 5-day course of Prednisone.  Plan:  Cipro 250 mg PO BID.  Will follow renal function, progress, length of therapy.  Height: 5\' 8"  (172.7 cm) Weight: 136 lb 6.4 oz (61.9 kg)(b scale) IBW/kg (Calculated) : 68.4  Temp (24hrs), Avg:97.9 F (36.6 C), Min:97.6 F (36.4 C), Max:98.1 F (36.7 C)  Recent Labs  Lab 12/22/17 1444 12/22/17 1847 12/22/17 1954 12/23/17 0439  WBC 15.8*  --   --  9.5  CREATININE 1.53*  --   --  1.48*  LATICACIDVEN  --  2.51* 2.23*  --     Estimated Creatinine Clearance: 34.9 mL/min (A) (by C-G formula based on SCr of 1.48 mg/dL (H)).    Allergies  Allergen Reactions  . Penicillins Hives, Diarrhea and Nausea And Vomiting    Has patient had a PCN reaction causing immediate rash, facial/tongue/throat swelling, SOB or lightheadedness with hypotension: Yes Has patient had a PCN reaction causing severe rash involving mucus membranes or skin necrosis: No Has patient had a PCN reaction that required hospitalization: No Has patient had a PCN reaction occurring within the last 10 years: No If all of the above answers are "NO", then may proceed with Cephalosporin use.   [name]. Amoxicillin Hives, Diarrhea and Nausea And Vomiting  . Sulfa Antibiotics Nausea Only    Antimicrobials this admission:  Meropenem 4/16>>4/18  Ceftriaxone x 1 on 4/16  Cipro PO 4/18>>  Dose adjustments this admission:  n/a  Microbiology results:  4/17 BCID - Staph species, probable contaminant  4/16 urine - > 100K.ml Pseudomonas - sens Fortaz, Cefepime, Cipro, Imi, Zosyn, Gent  (but MIC 2)  4/16 blood x 2 - 1 with CoNS, 2nd ng < 24 hrs to date  Prior admit:  3/16 urine - > 100 K/ml Pseudomonas and 50 K/ml Enterococcus    Pseudomonas sensitive to Cefepime, Ceftaz, Imi, Zosyn, Cipro    Enterococcus sensitive to amp, lvq, nitrofurantoin, vanc (MIC 2)  Thank you for allowing pharmacy to be a part of this patient's care.  [name], David Duke, [place] Pager: [phone] 12/24/2017 9:31 AM

## 2017-12-24 NOTE — NC FL2 (Signed)
[place] MEDICAID FL2 LEVEL OF CARE SCREENING TOOL     IDENTIFICATION  Patient Name: David Duke Birthdate: 1934/11/19 Sex: male Admission Date (Current Location): 12/22/2017  [facility] and [place] Number:  Producer, television/film/video and Address:  [facility] [place], [address], [place], [place] [address]      Provider Number: [id number]  Attending Physician Name and Address:  [name], MD  Relative Name and Phone Number:       Current Level of Care: Hospital Recommended Level of Care: Skilled Nursing Facility Prior Approval Number:    Date Approved/Denied:   PASRR Number: [id number]  Discharge Plan: SNF    Current Diagnoses: Patient Active Problem List   Diagnosis Date Noted  . CKD (chronic kidney disease) stage 3, GFR 30-59 ml/min (HCC) 12/22/2017  . Catheter-associated urinary tract infection (HCC) 12/22/2017  . COPD with acute exacerbation (HCC) 11/30/2017  . UTI (urinary tract infection) 11/23/2017  . HCAP (healthcare-associated pneumonia) 09/18/2017  . Sepsis (HCC)   . Acute respiratory failure with hypoxia (HCC) 09/07/2017  . Rhinitis, chronic 09/07/2017  . Malnutrition of moderate degree 09/07/2017  . Elevated troponin 03/27/2017  . Diplopia 03/27/2017  . Bilateral leg edema 03/27/2017  . Enterocolitis 09/30/2016  . HLD (hyperlipidemia) 09/30/2016  . Tobacco abuse 09/30/2016  . Nausea vomiting and diarrhea 09/30/2016  . History of stroke in prior 3 months 01/22/2016  . Chronic tension-type headache, not intractable   . Acute frontal sinusitis   . 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 Gait disturbance, post-stroke   . Ataxia, late effect of cerebrovascular disease   . BPH (benign prostatic hyperplasia)   . Urinary retention   . Adjustment disorder with mixed anxiety and depressed mood   . Chronic obstructive pulmonary disease (HCC)   . Cephalalgia   . Acute kidney injury superimposed on chronic  kidney disease (HCC)   . Macular degeneration   . Prediabetes   . Hypokalemia   . Absolute anemia   . Aphasia   . Acute encephalopathy 11/25/2015  . Hyponatremia 11/25/2015  . Acute kidney injury (HCC) 11/25/2015  . Depression   . Anxiety   . COPD (chronic obstructive pulmonary disease) (HCC)   . 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, Ataxia, late effect of cerebrovascular disease ) Continent Diet(DYS 3)  AMBULATORY STATUS COMMUNICATION OF NEEDS Skin   Limited Assist Verbally Other (Comment)(Fissure)                       Personal Care Assistance Level of Assistance  Bathing, Feeding, Dressing Bathing Assistance: Limited assistance Feeding assistance: Limited assistance Dressing Assistance: Limited assistance     Functional Limitations Info  Sight, Hearing, Speech Sight Info: Adequate Hearing Info: Adequate Speech Info: Adequate    SPECIAL CARE FACTORS FREQUENCY  PT (By licensed PT), OT (By licensed OT)     PT Frequency: 5 x week OT Frequency: 5 x week            Contractures Contractures Info: Not present    Additional Factors Info  Code Status, Allergies Code Status Info: Full Allergies Info: Penicillins, Amoxicillin, Sulfa Antibiotics  Current Medications (12/24/2017):  This is the current hospital active medication list Current Facility-Administered Medications  Medication Dose Route Frequency Provider Last Rate Last Dose  . acetaminophen (TYLENOL) tablet 650 mg  650 mg Oral Q6H PRN Hillary BowGardner, Jared M, DO       Or  . acetaminophen (TYLENOL) suppository 650 mg  650 mg Rectal Q6H PRN Hillary BowGardner, Jared M, DO      . albuterol (PROVENTIL)  (2.5 MG/3ML) 0.083% nebulizer solution 2.5 mg  2.5 mg Nebulization Q1H PRN Hillary BowGardner, Jared M, DO      . busPIRone (BUSPAR) tablet 5 mg  5 mg Oral BID Lyda PeroneGardner, Jared M, DO   5 mg at 12/24/17 0930  . ciprofloxacin (CIPRO) tablet 250 mg  250 mg Oral BID Della GooSinclair, Emily S, RPH   250 mg at 12/24/17 1017  . diazepam (VALIUM) tablet 5 mg  5 mg Oral Q6H Tyrone NineGrunz, Ryan B, MD   5 mg at 12/24/17 1214  . enoxaparin (LOVENOX) injection 40 mg  40 mg Subcutaneous Q24H Lyda PeroneGardner, Jared M, DO   40 mg at 12/23/17 2006  . finasteride (PROSCAR) tablet 5 mg  5 mg Oral Daily Lyda PeroneGardner, Jared M, DO   5 mg at 12/24/17 0930  . fluticasone (FLONASE) 50 MCG/ACT nasal spray 1 spray  1 spray Each Nare Daily PRN Hillary BowGardner, Jared M, DO   1 spray at 12/24/17 0933  . guaiFENesin (MUCINEX) 12 hr tablet 1,200 mg  1,200 mg Oral BID PRN Hillary BowGardner, Jared M, DO      . ipratropium-albuterol (DUONEB) 0.5-2.5 (3) MG/3ML nebulizer solution 3 mL  3 mL Nebulization QID Lyda PeroneGardner, Jared M, DO   3 mL at 12/24/17 1131  . mometasone-formoterol (DULERA) 200-5 MCG/ACT inhaler 2 puff  2 puff Inhalation BID Hillary BowGardner, Jared M, DO   2 puff at 12/24/17 0818  . multivitamin with minerals tablet 1 tablet  1 tablet Oral Daily Lyda PeroneGardner, Jared M, DO   1 tablet at 12/24/17 1018  . nicotine (NICODERM CQ - dosed in mg/24 hr) patch 7 mg  7 mg Transdermal Daily Julian ReilGardner, Jared M, DO      . nystatin (MYCOSTATIN/NYSTOP) topical powder   Topical PRN Hillary BowGardner, Jared M, DO      . ondansetron [facility](ZOFRAN) tablet 4 mg  4 mg Oral Q6H PRN Hillary BowGardner, Jared M, DO       Or  . ondansetron [facility](ZOFRAN) injection 4 mg  4 mg Intravenous Q6H PRN Hillary BowGardner, Jared M, DO      . pantoprazole (PROTONIX) EC tablet 40 mg  40 mg Oral Daily Lyda PeroneGardner, Jared M, DO   40 mg at 12/24/17 0930  . polyethylene glycol (MIRALAX / GLYCOLAX) packet 17 g  17 g Oral Daily PRN Hillary BowGardner, Jared M, DO      . predniSONE (DELTASONE) tablet 40 mg  40 mg Oral Q breakfast Lyda PeroneGardner, Jared M, DO   40 mg at 12/24/17 0930     Discharge  Medications: Please see discharge summary for a list of discharge medications.  Relevant Imaging Results:  Relevant Lab Results:   Additional Information SS#: [id number]  Margarito LinerSarah C Vannesa Abair, LCSW

## 2017-12-24 NOTE — Clinical Social Work Note (Signed)
Clinical Social Work Assessment  Patient Details  Name: David Duke MRN: [id number] Date of Birth: 12/21/1934  Date of referral:  12/24/17               Reason for consult:  Facility Placement, Discharge Planning                Permission sought to share information with:  Facility Medical sales representativeContact Representative, Family Supports Permission granted to share information::     Name::     David EkSabrina Duke  Agency::  SNF's  Relationship::  Wife  Contact Information:  [phone]  Housing/Transportation Living arrangements for the past 2 months:  Single Family Home Source of Information:  Medical Team, Spouse Patient Interpreter Needed:  None Criminal Activity/Legal Involvement Pertinent to Current Situation/Hospitalization:  No - Comment as needed Significant Relationships:  Spouse Lives with:  Spouse Do you feel safe going back to the place where you live?  Yes Need for family participation in patient care:  Yes (Comment)  Care giving concerns:  PT recommending SNF once medically stable.   Social Worker assessment / plan:  Patient not fully oriented. No supports at bedside. CSW called patient's wife, introduced role, and explained that PT recommendations would be discussed. Patient's wife agreeable to SNF placement. No preference of facility. CSW will leave SNF list at front desk for her to pick up when she arrives. No further concerns. CSW encouraged patient's wife to contact CSW as needed. CSW will continue to follow patient and his wife for support and facilitate discharge to SNF once medically stable.  Employment status:  Retired Database administratornsurance information:  Managed Medicare PT Recommendations:  Skilled Nursing Facility Information / Referral to community resources:  Skilled Nursing Facility  Patient/Family's Response to care:  Patient not fully oriented. Patient's wife agreeable to SNF placement. Patient's wife supportive and involved in patient's care. Patient's wife appreciated social  work intervention.  Patient/Family's Understanding of and Emotional Response to Diagnosis, Current Treatment, and Prognosis:  Patient not fully oriented. Patient's wife has a good understanding of the reason for admission and his need for rehab prior to returning home. Patient's wife appears happy with hospital care.  Emotional Assessment Appearance:  Appears stated age Attitude/Demeanor/Rapport:  Unable to Assess Affect (typically observed):  Unable to Assess Orientation:  Oriented to Self Alcohol / Substance use:  Never Used Psych involvement (Current and /or in the community):  No (Comment)  Discharge Needs  Concerns to be addressed:  Care Coordination Readmission within the last 30 days:  Yes Current discharge risk:  Cognitively Impaired, Dependent with Mobility Barriers to Discharge:  Insurance Authorization   Margarito LinerSarah C Elany Felix, LCSW 12/24/2017, 1:39 PM

## 2017-12-25 MED ORDER — CIPROFLOXACIN HCL 250 MG PO TABS: 250.0000 mg | ORAL_TABLET | Freq: Two times a day (BID) | ORAL | Status: DC

## 2017-12-25 MED ORDER — PREDNISONE 20 MG PO TABS: 40.0000 mg | ORAL_TABLET | Freq: Every day | ORAL | Status: DC

## 2017-12-25 MED ORDER — DIAZEPAM 5 MG PO TABS: 5.0000 mg | ORAL_TABLET | Freq: Four times a day (QID) | ORAL | 0 refills | Status: AC

## 2017-12-25 NOTE — Care Management Important Message (Signed)
Important Message  Patient Details  Name: David BarlowDavid R Duke MRN: [id number] Date of Birth: 01/17/1935   Medicare Important Message Given:  No Correction patient not able to sign/Unsigned copy left per patient request   David BodoIris Desani Duke 12/25/2017, 2:23 PM

## 2017-12-25 NOTE — Care Management (Signed)
Pt discharge to SNF - facilitated by CSW

## 2017-12-25 NOTE — Discharge Summary (Signed)
Physician Discharge Summary  David Duke ZOX:[id number] DOB: [birth year] DOA: 12/22/2017  PCP: [name], MD  Admit date: 12/22/2017 Discharge date: 12/25/2017  Admitted From: Home Disposition: SNF   Recommendations for Outpatient Follow-up:  1. Follow up with PCP in 1-2 weeks  Home Health: N/A Equipment/Devices: Per SNF Discharge Condition: Stable CODE STATUS: Full Diet recommendation: Heart healthy  Brief/Interim Summary: David Duke is an 83 y.o. male with a history of tobacco use, COPD, stage III CKD, anxiety, BPH with chronic indwelling foley catheter, hemorrhagic CVA with vascular dementia, and recurrent admissions who presented to the ED from home with recurrent cough and dyspnea. After recent discharge for COPD exacerbation, his wife reports he had gone back to smoking which preceded these current symptoms including cough, dyspnea, respiratory distress and increasing sputum production. EMS administered IV steroid, duonebs. Work up in ED showed pyuria, negative CXR, WBC 15k. Treatment for acute COPD exacerbation was continued and he was admitted 4/16. Oxygen supplementation was weaned and respiratory status is improving. Urine culture has grown pseudomonas which was treated with ciprofloxacin (initially meropenem). Therapy consultants have work with the patient and recommend short term rehabilitation prior to returning home to facilitate safety at home.  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  - Continue steroids (prednisone) x5 total days (4/17 - 4/21) - Continue scheduled and prn bronchodilators.   Tobacco use:  - Cessation  counseling provided to both patient and wife, as this is precipitant of exacerbation.  - Nicotine patch ordered. Recommend continued cessation counseling.  Pseudomonas CAUTI: Hx of pseudomonas which was pansensitive and enterococcus.  - Started on empiric meropenem, changed to ciprofloxacin 4/18 based on susceptibilities. Total 7 days Tx (4/16 - 4/22).  - Foley changed out after voiding trial failed 4/17. Continued chronic foley at discharge.   Stage III CKD: At baseline.  - Renally dose medications, avoid nephrotoxins.   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 This was recommended by PT and the pt and wife are amenable. - Dysphagia 3 diet.   Anxiety: Stable.  - Reordered home medications including buspar and valium 5mg  q6h. This is contributing to respiratory symptoms, and he may benefit from augmented dosing as he has been on this dose for a long time.  Discharge Instructions Discharge Instructions    Increase activity slowly   Complete by:  As directed      Allergies as of 12/25/2017      Reactions   Penicillins Hives, Diarrhea, Nausea And Vomiting   Has patient had a PCN reaction causing immediate rash, facial/tongue/throat swelling, SOB or lightheadedness with hypotension: Yes Has patient had a PCN reaction causing severe rash involving mucus membranes or skin necrosis: No Has patient had a PCN reaction that required hospitalization: No Has patient had a PCN reaction occurring within the last 10 years: No If all of the above answers are "NO", then may proceed with Cephalosporin use.  Amoxicillin Hives, Diarrhea, Nausea And Vomiting   Sulfa Antibiotics Nausea Only       Medication List    TAKE these medications   acetaminophen 500 MG tablet Commonly known as:  TYLENOL Take 500 mg by mouth every 6 (six) hours as needed for mild pain, moderate pain or headache.   albuterol (2.5 MG/3ML) 0.083% nebulizer solution Commonly known as:  PROVENTIL Take 2.5 mg by nebulization every 8 (eight) hours as needed for wheezing or shortness of breath.   albuterol 108 (90 Base) MCG/ACT inhaler Commonly known as:  PROVENTIL HFA;VENTOLIN HFA Inhale 1-2 puffs into the lungs every 6 (six) hours as needed for wheezing or shortness of breath.   busPIRone 5 MG tablet Commonly known as:  BUSPAR Take 5 mg by mouth 2 (two) times daily.   ciprofloxacin 250 MG tablet Commonly known as:  CIPRO Take 1 tablet (250 mg total) by mouth 2 (two) times daily.   diazepam 5 MG tablet Commonly known as:  VALIUM Take 1 tablet (5 mg total) by mouth every 6 (six) hours. What changed:    when to take this  reasons to take this   finasteride 5 MG tablet Commonly known as:  PROSCAR Take 5 mg by mouth daily.   fluticasone 50 MCG/ACT nasal spray Commonly known as:  FLONASE Place 1 spray into both nostrils daily. What changed:    when to take this  reasons to take this   guaiFENesin 600 MG 12 hr tablet Commonly known as:  MUCINEX Take 2 tablets (1,200 mg total) by mouth 2 (two) times daily. What changed:    when to take this  reasons to take this   ipratropium-albuterol 0.5-2.5 (3) MG/3ML Soln Commonly known as:  DUONEB Take 3 mLs by nebulization every 6 (six) hours as needed.   multivitamin with minerals Tabs tablet Take 1 tablet by mouth daily.   nicotine 14 mg/24hr patch Commonly known as:  NICODERM CQ - dosed in mg/24 hours Place 1 patch (14 mg total) onto the skin daily.   nystatin powder Commonly known as:  MYCOSTATIN/NYSTOP Apply topically as needed.   polyethylene glycol packet Commonly known as:  MIRALAX / GLYCOLAX Take 17 g by mouth daily. What  changed:    when to take this  reasons to take this   predniSONE 20 MG tablet Commonly known as:  DELTASONE Take 2 tablets (40 mg total) by mouth daily with breakfast. Start taking on:  12/26/2017   PREVACID 30 MG capsule Generic drug:  lansoprazole Take 30 mg by mouth daily after breakfast.   SYSTANE 0.4-0.3 % Soln Generic drug:  Polyethyl Glycol-Propyl Glycol Apply 1 drop to eye as needed.   ZOFRAN 4 MG tablet Generic drug:  ondansetron Take 4 mg by mouth every 6 (six) hours as needed.      Follow-up Information    [facility] EMERGENCY DEPARTMENT.   Specialty:  Emergency Medicine Why:  Return to ED with new or worsening symptoms or concerns Contact information: [address] [id number] mc [address] [place] [address] [phone]       [name], MD.   Specialty:  Internal Medicine Contact information: [address] [place] [address] [phone]          Allergies  Allergen Reactions  . Penicillins Hives, Diarrhea and Nausea And Vomiting    Has patient had a PCN reaction causing immediate rash, facial/tongue/throat swelling, SOB or lightheadedness with hypotension: Yes Has patient had a PCN reaction  causing severe rash involving mucus membranes or skin necrosis: No Has patient had a PCN reaction that required hospitalization: No Has patient had a PCN reaction occurring within the last 10 years: No If all of the above answers are "NO", then may proceed with Cephalosporin use.   [name] Amoxicillin Hives, Diarrhea and Nausea And Vomiting  . Sulfa Antibiotics Nausea Only    Consultations:  None  Procedures/Studies: Dg Chest 2 View  Result Date: 12/22/2017 CLINICAL DATA:  Cough and weakness for 2 days, history stroke, COPD, fell last week, trouble walking, smoker EXAM: CHEST - 2 VIEW COMPARISON:  11/30/2017 FINDINGS: Normal heart size, mediastinal contours, and pulmonary vascularity. Lungs hyperinflated but clear.  Minimal central peribronchial thickening. No acute infiltrate, pleural effusion or pneumothorax. Bones demineralized. IMPRESSION: Hyperinflation and central peribronchial thickening question COPD. No acute infiltrate. Electronically Signed   By: Ulyses Southward M.D.   On: 12/22/2017 16:27   Dg Chest 2 View  Result Date: 11/30/2017 CLINICAL DATA:  83 year old male with shortness of breath EXAM: CHEST - 2 VIEW COMPARISON:  Chest radiograph dated 11/21/2017 FINDINGS: The lungs are clear. There is no pleural effusion or pneumothorax. The cardiac silhouette is within normal limits. No acute osseous pathology. IMPRESSION: No active cardiopulmonary disease. Electronically Signed   By: Elgie Collard M.D.   On: 11/30/2017 06:47    Subjective: Breathing at baseline, making efforts with physical therapy, feels he's getting stronger. Very anxious about storms outside. No chest pain, palpitations, leg swelling, orthopnea, or fever.   Discharge Exam: Vitals:   12/25/17 0649 12/25/17 0908  BP: (!) 143/85   Pulse: 77   Resp:    Temp: (!) 97.5 F (36.4 C)   SpO2: 97% 99%   General: Pt is alert, awake, not in acute distress Cardiovascular: RRR, S1/S2 +, no rubs, no gallops Respiratory: Nonlabored, clear with mildly prolonged expiration.  Abdominal: Soft, NT, ND, bowel sounds + Extremities: No edema, no cyanosis Neuro: Alert, conversant, oriented but has impaired short term recall. +ataxia without focal numbness or weakness.  Psych: Judgement and insight appear fair, but impaired by cognitive dysfunction. Mood anxious & affect appropriate.  Labs: BNP (last 3 results) Recent Labs    09/07/17 0213 09/18/17 1355 11/30/17 0555  BNP 24.3 56.4 21.9   Basic Metabolic Panel: Recent Labs  Lab 12/22/17 1444 12/23/17 0439  NA 139 140  K 4.0 4.4  CL 103 107  CO2 25 23  GLUCOSE 126* 121*  BUN 10 15  CREATININE 1.53* 1.48*  CALCIUM 9.0 8.7*   Liver Function Tests: No results for input(s): AST, ALT,  ALKPHOS, BILITOT, PROT, ALBUMIN in the last 168 hours. No results for input(s): LIPASE, AMYLASE in the last 168 hours. No results for input(s): AMMONIA in the last 168 hours. CBC: Recent Labs  Lab 12/22/17 1444 12/23/17 0439  WBC 15.8* 9.5  HGB 14.8 12.5*  HCT 43.8 38.1*  MCV 90.9 91.8  PLT 222 217   Cardiac Enzymes: No results for input(s): CKTOTAL, CKMB, CKMBINDEX, TROPONINI in the last 168 hours. BNP: Invalid input(s): POCBNP CBG: No results for input(s): GLUCAP in the last 168 hours. D-Dimer No results for input(s): DDIMER in the last 72 hours. Hgb A1c No results for input(s): HGBA1C in the last 72 hours. Lipid Profile No results for input(s): CHOL, HDL, LDLCALC, TRIG, CHOLHDL, LDLDIRECT in the last 72 hours. Thyroid function studies No results for input(s): TSH, T4TOTAL, T3FREE, THYROIDAB in the last 72 hours.  Invalid input(s): FREET3 Anemia work up  No results for input(s): VITAMINB12, FOLATE, FERRITIN, TIBC, IRON, RETICCTPCT in the last 72 hours. Urinalysis    Component Value Date/Time   COLORURINE YELLOW 12/22/2017 1707   APPEARANCEUR CLOUDY (A) 12/22/2017 1707   LABSPEC 1.009 12/22/2017 1707   PHURINE 7.0 12/22/2017 1707   GLUCOSEU NEGATIVE 12/22/2017 1707   HGBUR SMALL (A) 12/22/2017 1707   BILIRUBINUR NEGATIVE 12/22/2017 1707   KETONESUR 20 (A) 12/22/2017 1707   PROTEINUR NEGATIVE 12/22/2017 1707   UROBILINOGEN 0.2 03/31/2015 1850   NITRITE NEGATIVE 12/22/2017 1707   LEUKOCYTESUR LARGE (A) 12/22/2017 1707    Microbiology Recent Results (from the past 240 hour(s))  Culture, blood (routine x 2)     Status: Abnormal   Collection Time: 12/22/17  7:00 PM  Result Value Ref Range Status   Specimen Description BLOOD LEFT ANTECUBITAL  Final   Special Requests   Final    BOTTLES DRAWN AEROBIC AND ANAEROBIC Blood Culture results may not be optimal due to an excessive volume of blood received in culture bottles   Culture  Setup Time   Final    GRAM POSITIVE  COCCI AEROBIC BOTTLE ONLY Organism ID to follow CRITICAL RESULT CALLED TO, READ BACK BY AND VERIFIED WITH: Patrick North PHARMD [id number] 12/23/17 A BROWNING    Culture (A)  Final    STAPHYLOCOCCUS SPECIES (COAGULASE NEGATIVE) THE SIGNIFICANCE OF ISOLATING THIS ORGANISM FROM A SINGLE SET OF BLOOD CULTURES WHEN MULTIPLE SETS ARE DRAWN IS UNCERTAIN. PLEASE NOTIFY THE MICROBIOLOGY DEPARTMENT WITHIN ONE WEEK IF SPECIATION AND SENSITIVITIES ARE REQUIRED. Performed at [facility], [address] [place], [place] [address]    Report Status 12/25/2017 FINAL  Final  Blood Culture ID Panel (Reflexed)     Status: Abnormal   Collection Time: 12/22/17  7:00 PM  Result Value Ref Range Status   Enterococcus species NOT DETECTED NOT DETECTED Final   Listeria monocytogenes NOT DETECTED NOT DETECTED Final   Staphylococcus species DETECTED (A) NOT DETECTED Final    Comment: Methicillin (oxacillin) susceptible coagulase negative staphylococcus. Possible blood culture contaminant (unless isolated from more than one blood culture draw or clinical case suggests pathogenicity). No antibiotic treatment is indicated for blood  culture contaminants. CRITICAL RESULT CALLED TO, READ BACK BY AND VERIFIED WITH: Patrick North PHARMD [id number] 12/23/17 A BROWNING    Staphylococcus aureus NOT DETECTED NOT DETECTED Final   Methicillin resistance NOT DETECTED NOT DETECTED Final   Streptococcus species NOT DETECTED NOT DETECTED Final   Streptococcus agalactiae NOT DETECTED NOT DETECTED Final   Streptococcus pneumoniae NOT DETECTED NOT DETECTED Final   Streptococcus pyogenes NOT DETECTED NOT DETECTED Final   Acinetobacter baumannii NOT DETECTED NOT DETECTED Final   Enterobacteriaceae species NOT DETECTED NOT DETECTED Final   Enterobacter cloacae complex NOT DETECTED NOT DETECTED Final   Escherichia coli NOT DETECTED NOT DETECTED Final   Klebsiella oxytoca NOT DETECTED NOT DETECTED Final   Klebsiella pneumoniae NOT DETECTED NOT  DETECTED Final   Proteus species NOT DETECTED NOT DETECTED Final   Serratia marcescens NOT DETECTED NOT DETECTED Final   Haemophilus influenzae NOT DETECTED NOT DETECTED Final   Neisseria meningitidis NOT DETECTED NOT DETECTED Final   Pseudomonas aeruginosa NOT DETECTED NOT DETECTED Final   Candida albicans NOT DETECTED NOT DETECTED Final   Candida glabrata NOT DETECTED NOT DETECTED Final   Candida krusei NOT DETECTED NOT DETECTED Final   Candida parapsilosis NOT DETECTED NOT DETECTED Final   Candida tropicalis NOT DETECTED NOT DETECTED Final    Comment:  Performed at [facility], [address] [place], [name] [address]  Urine culture     Status: Abnormal (Preliminary result)   Collection Time: 12/22/17  7:09 PM  Result Value Ref Range Status   Specimen Description URINE, CATHETERIZED  Final   Special Requests NONE  Final   Culture (A)  Final    >=100,000 COLONIES/mL PSEUDOMONAS AERUGINOSA CULTURE REINCUBATED FOR BETTER GROWTH Performed at [facility], [address] [place], [name] [address]    Report Status PENDING  Incomplete   Organism ID, Bacteria PSEUDOMONAS AERUGINOSA (A)  Final      Susceptibility   Pseudomonas aeruginosa - MIC*    CEFTAZIDIME 4 SENSITIVE Sensitive     CIPROFLOXACIN <=0.25 SENSITIVE Sensitive     GENTAMICIN 2 SENSITIVE Sensitive     IMIPENEM 2 SENSITIVE Sensitive     PIP/TAZO 8 SENSITIVE Sensitive     CEFEPIME 2 SENSITIVE Sensitive     * >=100,000 COLONIES/mL PSEUDOMONAS AERUGINOSA  Culture, blood (routine x 2)     Status: None (Preliminary result)   Collection Time: 12/22/17  7:40 PM  Result Value Ref Range Status   Specimen Description BLOOD RIGHT HAND  Final   Special Requests   Final    BOTTLES DRAWN AEROBIC AND ANAEROBIC Blood Culture adequate volume   Culture   Final    NO GROWTH 2 DAYS Performed at [facility], [address] [place], [name] [address]    Report Status PENDING  Incomplete    Time coordinating  discharge: Approximately 40 minutes  Hazeline Junkeryan Javion Holmer, MD  [facility] 12/25/2017, 11:22 AM Pager [phone]

## 2017-12-25 NOTE — Progress Notes (Signed)
Report called and given to [place] at [facility]. Stanford BreedBracey, Matty Deamer N RN 3:05 PM 12-25-2017

## 2017-12-25 NOTE — Clinical Social Work Placement (Addendum)
   CLINICAL SOCIAL WORK PLACEMENT  NOTE 12/25/17 - DISCHARGED TO [facility] **Number for Report - [phone], room #109**.   Date:  12/25/2017  Patient Details  Name: David BarlowDavid R Lupu MRN: [id number] Date of Birth: 05/02/1935  Clinical Social Work is seeking post-discharge placement for this patient at the Skilled  Nursing Facility level of care (*CSW will initial, date and re-position this form in  chart as items are completed):  Yes   Patient/family provided with [facility] Clinical Social Work Department's list of facilities offering this level of care within the geographic area requested by the patient (or if unable, by the patient's family).  Yes   Patient/family informed of their freedom to choose among providers that offer the needed level of care, that participate in Medicare, Medicaid or managed care program needed by the patient, have an available bed and are willing to accept the patient.  Yes   Patient/family informed of 's ownership interest in [facility] and [facility], as well as of the fact that they are under no obligation to receive care at these facilities.  PASRR submitted to EDS on       PASRR number received on       Existing PASRR number confirmed on 12/24/17     FL2 transmitted to all facilities in geographic area requested by pt/family on 12/24/17     FL2 transmitted to all facilities within larger geographic area on       Patient informed that his/her managed care company has contracts with or will negotiate with certain facilities, including the following:        Yes   Patient/family informed of bed offers received.  Patient chooses bed at [facility]     Physician recommends and patient chooses bed at      [facility] 12[facility]17.  Patient to be transferred to facility by Ambulance     Patient family notified on 12/25/17 of transfer.  Name of family member notified:   Burt EkSabrina Yoak - wife by phone [phone])     PHYSICIAN       Additional Comment:    _______________________________________________ Cristobal Goldmannrawford, Lasasha Brophy Bradley, LCSW 12/25/2017, 2:35 PM

## 2017-12-25 NOTE — Care Management Important Message (Signed)
Important Message  Patient Details  Name: David BarlowDavid R Duke MRN: [id number] Date of Birth: 12/19/1934   Medicare Important Message Given:  No  Patient is not able to sign /Unsigned copy left with the patient for family   [name] 12/25/2017, 12:05 PM

## 2017-12-26 LAB — URINE CULTURE: Culture: >=100000 — AB

## 2017-12-27 LAB — BLOOD CULTURE ID PANEL (REFLEXED)
ACINETOBACTER BAUMANNII: NOT DETECTED
CANDIDA ALBICANS: NOT DETECTED
CANDIDA PARAPSILOSIS: NOT DETECTED
Candida glabrata: NOT DETECTED
Candida krusei: NOT DETECTED
Candida tropicalis: NOT DETECTED
ENTEROBACTER CLOACAE COMPLEX: NOT DETECTED
ENTEROBACTERIACEAE SPECIES: NOT DETECTED
ENTEROCOCCUS SPECIES: NOT DETECTED
ESCHERICHIA COLI: NOT DETECTED
Haemophilus influenzae: NOT DETECTED
KLEBSIELLA OXYTOCA: NOT DETECTED
Klebsiella pneumoniae: NOT DETECTED
LISTERIA MONOCYTOGENES: NOT DETECTED
Neisseria meningitidis: NOT DETECTED
Proteus species: NOT DETECTED
Pseudomonas aeruginosa: NOT DETECTED
STREPTOCOCCUS PYOGENES: NOT DETECTED
Serratia marcescens: NOT DETECTED
Staphylococcus aureus (BCID): NOT DETECTED
Staphylococcus species: NOT DETECTED
Streptococcus agalactiae: NOT DETECTED
Streptococcus pneumoniae: NOT DETECTED
Streptococcus species: NOT DETECTED

## 2017-12-27 LAB — CULTURE, BLOOD (ROUTINE X 2)
Culture: NO GROWTH
Special Requests: ADEQUATE

## 2017-12-29 LAB — CULTURE, BLOOD (ROUTINE X 2)

## 2017-12-31 ENCOUNTER — Encounter: Payer: Medicare Other | Admitting: Vascular Surgery

## 2017-12-31 ENCOUNTER — Inpatient Hospital Stay (HOSPITAL_COMMUNITY): Admission: RE | Admit: 2017-12-31 | Payer: Medicare Other | Source: Ambulatory Visit

## 2017-12-31 ENCOUNTER — Other Ambulatory Visit (HOSPITAL_COMMUNITY): Payer: Medicare Other

## 2018-01-15 ENCOUNTER — Inpatient Hospital Stay (HOSPITAL_COMMUNITY)
Admission: EM | Admit: 2018-01-15 | Discharge: 2018-01-18 | DRG: 698 | Disposition: A | Discharge status: Skilled Nursing Facility | Payer: Medicare Other | Attending: Internal Medicine | Admitting: Internal Medicine

## 2018-01-15 ENCOUNTER — Other Ambulatory Visit: Payer: Self-pay

## 2018-01-15 ENCOUNTER — Encounter (HOSPITAL_COMMUNITY): Payer: Self-pay | Admitting: Emergency Medicine

## 2018-01-15 ENCOUNTER — Emergency Department (HOSPITAL_COMMUNITY): Payer: Medicare Other

## 2018-01-15 DIAGNOSIS — Z880 Allergy status to penicillin: Secondary | ICD-10-CM

## 2018-01-15 DIAGNOSIS — R4701 Aphasia: Secondary | ICD-10-CM | POA: Diagnosis present

## 2018-01-15 DIAGNOSIS — I69919 Unspecified symptoms and signs involving cognitive functions following unspecified cerebrovascular disease: Secondary | ICD-10-CM | POA: Diagnosis present

## 2018-01-15 DIAGNOSIS — R29700 NIHSS score 0: Secondary | ICD-10-CM | POA: Diagnosis present

## 2018-01-15 DIAGNOSIS — N183 Chronic kidney disease, stage 3 unspecified: Secondary | ICD-10-CM | POA: Diagnosis present

## 2018-01-15 DIAGNOSIS — J310 Chronic rhinitis: Secondary | ICD-10-CM | POA: Diagnosis present

## 2018-01-15 DIAGNOSIS — R1310 Dysphagia, unspecified: Secondary | ICD-10-CM | POA: Diagnosis present

## 2018-01-15 DIAGNOSIS — N401 Enlarged prostate with lower urinary tract symptoms: Secondary | ICD-10-CM | POA: Diagnosis present

## 2018-01-15 DIAGNOSIS — J189 Pneumonia, unspecified organism: Secondary | ICD-10-CM | POA: Diagnosis present

## 2018-01-15 DIAGNOSIS — F0150 Vascular dementia without behavioral disturbance: Secondary | ICD-10-CM | POA: Diagnosis present

## 2018-01-15 DIAGNOSIS — N179 Acute kidney failure, unspecified: Secondary | ICD-10-CM | POA: Diagnosis present

## 2018-01-15 DIAGNOSIS — Z7189 Other specified counseling: Secondary | ICD-10-CM

## 2018-01-15 DIAGNOSIS — I69328 Other speech and language deficits following cerebral infarction: Secondary | ICD-10-CM

## 2018-01-15 DIAGNOSIS — G44229 Chronic tension-type headache, not intractable: Secondary | ICD-10-CM | POA: Diagnosis present

## 2018-01-15 DIAGNOSIS — J690 Pneumonitis due to inhalation of food and vomit: Secondary | ICD-10-CM | POA: Diagnosis present

## 2018-01-15 DIAGNOSIS — F17210 Nicotine dependence, cigarettes, uncomplicated: Secondary | ICD-10-CM | POA: Diagnosis present

## 2018-01-15 DIAGNOSIS — Y732 Prosthetic and other implants, materials and accessory gastroenterology and urology devices associated with adverse incidents: Secondary | ICD-10-CM | POA: Diagnosis present

## 2018-01-15 DIAGNOSIS — I69993 Ataxia following unspecified cerebrovascular disease: Secondary | ICD-10-CM

## 2018-01-15 DIAGNOSIS — E860 Dehydration: Secondary | ICD-10-CM | POA: Diagnosis present

## 2018-01-15 DIAGNOSIS — E440 Moderate protein-calorie malnutrition: Secondary | ICD-10-CM | POA: Diagnosis present

## 2018-01-15 DIAGNOSIS — Z79899 Other long term (current) drug therapy: Secondary | ICD-10-CM

## 2018-01-15 DIAGNOSIS — J449 Chronic obstructive pulmonary disease, unspecified: Secondary | ICD-10-CM | POA: Diagnosis not present

## 2018-01-15 DIAGNOSIS — Z515 Encounter for palliative care: Secondary | ICD-10-CM | POA: Diagnosis present

## 2018-01-15 DIAGNOSIS — N390 Urinary tract infection, site not specified: Secondary | ICD-10-CM

## 2018-01-15 DIAGNOSIS — H3530 Unspecified macular degeneration: Secondary | ICD-10-CM | POA: Diagnosis present

## 2018-01-15 DIAGNOSIS — H5461 Unqualified visual loss, right eye, normal vision left eye: Secondary | ICD-10-CM | POA: Diagnosis present

## 2018-01-15 DIAGNOSIS — R339 Retention of urine, unspecified: Secondary | ICD-10-CM | POA: Diagnosis not present

## 2018-01-15 DIAGNOSIS — Y846 Urinary catheterization as the cause of abnormal reaction of the patient, or of later complication, without mention of misadventure at the time of the procedure: Secondary | ICD-10-CM | POA: Diagnosis present

## 2018-01-15 DIAGNOSIS — I714 Abdominal aortic aneurysm, without rupture: Secondary | ICD-10-CM | POA: Diagnosis present

## 2018-01-15 DIAGNOSIS — R319 Hematuria, unspecified: Secondary | ICD-10-CM | POA: Diagnosis present

## 2018-01-15 DIAGNOSIS — Z882 Allergy status to sulfonamides status: Secondary | ICD-10-CM

## 2018-01-15 DIAGNOSIS — T83518A Infection and inflammatory reaction due to other urinary catheter, initial encounter: Principal | ICD-10-CM | POA: Diagnosis present

## 2018-01-15 DIAGNOSIS — B370 Candidal stomatitis: Secondary | ICD-10-CM | POA: Diagnosis present

## 2018-01-15 DIAGNOSIS — G9340 Encephalopathy, unspecified: Secondary | ICD-10-CM | POA: Diagnosis present

## 2018-01-15 DIAGNOSIS — A4181 Sepsis due to Enterococcus: Secondary | ICD-10-CM | POA: Diagnosis present

## 2018-01-15 DIAGNOSIS — J440 Chronic obstructive pulmonary disease with acute lower respiratory infection: Secondary | ICD-10-CM | POA: Diagnosis present

## 2018-01-15 DIAGNOSIS — F329 Major depressive disorder, single episode, unspecified: Secondary | ICD-10-CM | POA: Diagnosis present

## 2018-01-15 DIAGNOSIS — Z66 Do not resuscitate: Secondary | ICD-10-CM | POA: Diagnosis present

## 2018-01-15 DIAGNOSIS — N319 Neuromuscular dysfunction of bladder, unspecified: Secondary | ICD-10-CM | POA: Diagnosis present

## 2018-01-15 DIAGNOSIS — Z6820 Body mass index (BMI) 20.0-20.9, adult: Secondary | ICD-10-CM

## 2018-01-15 DIAGNOSIS — R7303 Prediabetes: Secondary | ICD-10-CM | POA: Diagnosis present

## 2018-01-15 DIAGNOSIS — A4152 Sepsis due to Pseudomonas: Secondary | ICD-10-CM | POA: Diagnosis not present

## 2018-01-15 DIAGNOSIS — A419 Sepsis, unspecified organism: Secondary | ICD-10-CM | POA: Diagnosis not present

## 2018-01-15 DIAGNOSIS — F419 Anxiety disorder, unspecified: Secondary | ICD-10-CM | POA: Diagnosis present

## 2018-01-15 DIAGNOSIS — K219 Gastro-esophageal reflux disease without esophagitis: Secondary | ICD-10-CM | POA: Diagnosis present

## 2018-01-15 DIAGNOSIS — F32A Depression, unspecified: Secondary | ICD-10-CM | POA: Diagnosis present

## 2018-01-15 DIAGNOSIS — I69393 Ataxia following cerebral infarction: Secondary | ICD-10-CM

## 2018-01-15 DIAGNOSIS — Z7952 Long term (current) use of systemic steroids: Secondary | ICD-10-CM

## 2018-01-15 DIAGNOSIS — D649 Anemia, unspecified: Secondary | ICD-10-CM | POA: Diagnosis present

## 2018-01-15 DIAGNOSIS — R05 Cough: Secondary | ICD-10-CM

## 2018-01-15 DIAGNOSIS — R402413 Glasgow coma scale score 13-15, at hospital admission: Secondary | ICD-10-CM | POA: Diagnosis present

## 2018-01-15 DIAGNOSIS — R059 Cough, unspecified: Secondary | ICD-10-CM

## 2018-01-15 DIAGNOSIS — N289 Disorder of kidney and ureter, unspecified: Secondary | ICD-10-CM

## 2018-01-15 DIAGNOSIS — D631 Anemia in chronic kidney disease: Secondary | ICD-10-CM | POA: Diagnosis present

## 2018-01-15 DIAGNOSIS — B952 Enterococcus as the cause of diseases classified elsewhere: Secondary | ICD-10-CM | POA: Diagnosis present

## 2018-01-15 DIAGNOSIS — N400 Enlarged prostate without lower urinary tract symptoms: Secondary | ICD-10-CM | POA: Diagnosis present

## 2018-01-15 DIAGNOSIS — N189 Chronic kidney disease, unspecified: Secondary | ICD-10-CM

## 2018-01-15 LAB — URINALYSIS, ROUTINE W REFLEX MICROSCOPIC
Bilirubin Urine: NEGATIVE
GLUCOSE, UA: NEGATIVE mg/dL
KETONES UR: NEGATIVE mg/dL
Leukocytes, UA: NEGATIVE
NITRITE: POSITIVE — AB
PROTEIN: NEGATIVE mg/dL
Specific Gravity, Urine: 1.013 (ref 1.005–1.030)
pH: 6 (ref 5.0–8.0)

## 2018-01-15 LAB — CBC WITH DIFFERENTIAL/PLATELET
Basophils Absolute: 0 10*3/uL (ref 0.0–0.1)
Basophils Relative: 0 %
EOS ABS: 0.3 10*3/uL (ref 0.0–0.7)
Eosinophils Relative: 2 %
HEMATOCRIT: 42.4 % (ref 39.0–52.0)
HEMOGLOBIN: 13.8 g/dL (ref 13.0–17.0)
Lymphocytes Relative: 10 %
Lymphs Abs: 1.1 10*3/uL (ref 0.7–4.0)
MCH: 29.9 pg (ref 26.0–34.0)
MCHC: 32.5 g/dL (ref 30.0–36.0)
MCV: 92 fL (ref 78.0–100.0)
Monocytes Absolute: 0.9 10*3/uL (ref 0.1–1.0)
Monocytes Relative: 8 %
NEUTROS ABS: 8.8 10*3/uL — AB (ref 1.7–7.7)
NEUTROS PCT: 80 %
Platelets: 217 10*3/uL (ref 150–400)
RBC: 4.61 MIL/uL (ref 4.22–5.81)
RDW: 14.3 % (ref 11.5–15.5)
WBC: 11.1 10*3/uL — ABNORMAL HIGH (ref 4.0–10.5)

## 2018-01-15 LAB — COMPREHENSIVE METABOLIC PANEL
ALT: 38 U/L (ref 17–63)
ANION GAP: 12 (ref 5–15)
AST: 29 U/L (ref 15–41)
Albumin: 3.2 g/dL — ABNORMAL LOW (ref 3.5–5.0)
Alkaline Phosphatase: 69 U/L (ref 38–126)
BILIRUBIN TOTAL: 0.6 mg/dL (ref 0.3–1.2)
BUN: 13 mg/dL (ref 6–20)
CHLORIDE: 101 mmol/L (ref 101–111)
CO2: 26 mmol/L (ref 22–32)
Calcium: 8.8 mg/dL — ABNORMAL LOW (ref 8.9–10.3)
Creatinine, Ser: 1.56 mg/dL — ABNORMAL HIGH (ref 0.61–1.24)
GFR calc non Af Amer: 40 mL/min — ABNORMAL LOW (ref >60)
GFR, EST AFRICAN AMERICAN: 46 mL/min — AB (ref >60)
Glucose, Bld: 96 mg/dL (ref 65–99)
POTASSIUM: 3.6 mmol/L (ref 3.5–5.1)
Sodium: 139 mmol/L (ref 135–145)
TOTAL PROTEIN: 6.3 g/dL — AB (ref 6.5–8.1)

## 2018-01-15 LAB — I-STAT CG4 LACTIC ACID, ED
LACTIC ACID, VENOUS: 1.17 mmol/L (ref 0.5–1.9)
Lactic Acid, Venous: 2.28 mmol/L (ref 0.5–1.9)

## 2018-01-15 LAB — PROTIME-INR
INR: 0.9
PROTHROMBIN TIME: 12 s (ref 11.4–15.2)

## 2018-01-15 LAB — PROCALCITONIN: PROCALCITONIN: 0.39 ng/mL

## 2018-01-15 MED ORDER — SODIUM CHLORIDE 0.9 % IV BOLUS (SEPSIS)
1000.0000 mL | Freq: Once | INTRAVENOUS | Status: AC
  Administered 2018-01-15: 1000 mL via INTRAVENOUS

## 2018-01-15 MED ORDER — PANTOPRAZOLE SODIUM 40 MG PO TBEC
40.0000 mg | DELAYED_RELEASE_TABLET | Freq: Every day | ORAL | Status: DC
  Administered 2018-01-15 – 2018-01-18 (×4): 40 mg via ORAL
  Filled 2018-01-15 (×4): qty 1

## 2018-01-15 MED ORDER — VANCOMYCIN HCL IN DEXTROSE 1-5 GM/200ML-% IV SOLN
1000.0000 mg | Freq: Once | INTRAVENOUS | Status: AC
  Administered 2018-01-15: 1000 mg via INTRAVENOUS
  Filled 2018-01-15: qty 200

## 2018-01-15 MED ORDER — AZTREONAM 1 G IJ SOLR
1.0000 g | Freq: Three times a day (TID) | INTRAMUSCULAR | Status: DC
  Administered 2018-01-15 – 2018-01-16 (×2): 1 g via INTRAVENOUS
  Filled 2018-01-15 (×3): qty 1

## 2018-01-15 MED ORDER — VANCOMYCIN HCL IN DEXTROSE 1-5 GM/200ML-% IV SOLN: 1000.0000 mg | INTRAVENOUS | Status: DC

## 2018-01-15 MED ORDER — ONDANSETRON HCL 4 MG PO TABS: 4.0000 mg | ORAL_TABLET | Freq: Four times a day (QID) | ORAL | Status: DC | PRN

## 2018-01-15 MED ORDER — ALBUTEROL SULFATE HFA 108 (90 BASE) MCG/ACT IN AERS: 1.0000 | INHALATION_SPRAY | Freq: Four times a day (QID) | RESPIRATORY_TRACT | Status: DC | PRN

## 2018-01-15 MED ORDER — POLYVINYL ALCOHOL 1.4 % OP SOLN
1.0000 [drp] | OPHTHALMIC | Status: DC | PRN
  Filled 2018-01-15: qty 15

## 2018-01-15 MED ORDER — HEPARIN SODIUM (PORCINE) 5000 UNIT/ML IJ SOLN
5000.0000 [IU] | Freq: Three times a day (TID) | INTRAMUSCULAR | Status: DC
  Administered 2018-01-15 – 2018-01-18 (×10): 5000 [IU] via SUBCUTANEOUS
  Filled 2018-01-15 (×10): qty 1

## 2018-01-15 MED ORDER — LEVOFLOXACIN IN D5W 750 MG/150ML IV SOLN
750.0000 mg | INTRAVENOUS | Status: DC
  Administered 2018-01-16 – 2018-01-17 (×2): 750 mg via INTRAVENOUS
  Filled 2018-01-15 (×2): qty 150

## 2018-01-15 MED ORDER — PREDNISONE 20 MG PO TABS
40.0000 mg | ORAL_TABLET | Freq: Every day | ORAL | Status: DC
  Administered 2018-01-15: 40 mg via ORAL
  Filled 2018-01-15: qty 2

## 2018-01-15 MED ORDER — HYDROCODONE-ACETAMINOPHEN 5-325 MG PO TABS
1.0000 | ORAL_TABLET | ORAL | Status: DC | PRN
Start: 2018-01-15 — End: 2018-01-18
  Administered 2018-01-15: 1 via ORAL
  Filled 2018-01-15: qty 1

## 2018-01-15 MED ORDER — ONDANSETRON HCL 4 MG/2ML IJ SOLN: 4.0000 mg | Freq: Four times a day (QID) | INTRAMUSCULAR | Status: DC | PRN

## 2018-01-15 MED ORDER — ACETAMINOPHEN 650 MG RE SUPP: 650.0000 mg | Freq: Four times a day (QID) | RECTAL | Status: DC | PRN

## 2018-01-15 MED ORDER — NICOTINE 14 MG/24HR TD PT24
14.0000 mg | MEDICATED_PATCH | Freq: Every day | TRANSDERMAL | Status: DC
  Filled 2018-01-15: qty 1

## 2018-01-15 MED ORDER — BUSPIRONE HCL 10 MG PO TABS
5.0000 mg | ORAL_TABLET | Freq: Two times a day (BID) | ORAL | Status: DC
  Filled 2018-01-15: qty 1

## 2018-01-15 MED ORDER — NYSTATIN 100000 UNIT/ML MT SUSP
5.0000 mL | Freq: Four times a day (QID) | OROMUCOSAL | Status: DC
  Administered 2018-01-15 – 2018-01-18 (×10): 500000 [IU] via OROMUCOSAL
  Filled 2018-01-15 (×10): qty 5

## 2018-01-15 MED ORDER — ADULT MULTIVITAMIN W/MINERALS CH
1.0000 | ORAL_TABLET | Freq: Every day | ORAL | Status: DC
  Administered 2018-01-15 – 2018-01-18 (×4): 1 via ORAL
  Filled 2018-01-15 (×4): qty 1

## 2018-01-15 MED ORDER — ACETAMINOPHEN 325 MG PO TABS: 650.0000 mg | ORAL_TABLET | Freq: Four times a day (QID) | ORAL | Status: DC | PRN

## 2018-01-15 MED ORDER — LEVOFLOXACIN IN D5W 500 MG/100ML IV SOLN: 500.0000 mg | INTRAVENOUS | Status: DC

## 2018-01-15 MED ORDER — SENNOSIDES-DOCUSATE SODIUM 8.6-50 MG PO TABS
1.0000 | ORAL_TABLET | Freq: Every evening | ORAL | Status: DC | PRN
  Administered 2018-01-16: 1 via ORAL
  Filled 2018-01-15: qty 1

## 2018-01-15 MED ORDER — FLUTICASONE PROPIONATE 50 MCG/ACT NA SUSP
1.0000 | Freq: Every day | NASAL | Status: DC | PRN
Start: 2018-01-15 — End: 2018-01-18
  Filled 2018-01-15: qty 16

## 2018-01-15 MED ORDER — AZTREONAM 2 G IJ SOLR
2.0000 g | Freq: Three times a day (TID) | INTRAMUSCULAR | Status: DC
  Filled 2018-01-15: qty 2

## 2018-01-15 MED ORDER — IPRATROPIUM-ALBUTEROL 0.5-2.5 (3) MG/3ML IN SOLN: 3.0000 mL | Freq: Four times a day (QID) | RESPIRATORY_TRACT | Status: DC | PRN

## 2018-01-15 MED ORDER — ALBUTEROL SULFATE (2.5 MG/3ML) 0.083% IN NEBU
2.5000 mg | INHALATION_SOLUTION | Freq: Three times a day (TID) | RESPIRATORY_TRACT | Status: DC | PRN
Start: 2018-01-15 — End: 2018-01-16

## 2018-01-15 MED ORDER — IPRATROPIUM-ALBUTEROL 0.5-2.5 (3) MG/3ML IN SOLN
3.0000 mL | Freq: Four times a day (QID) | RESPIRATORY_TRACT | Status: DC
  Administered 2018-01-15 (×3): 3 mL via RESPIRATORY_TRACT
  Filled 2018-01-15 (×3): qty 3

## 2018-01-15 MED ORDER — SODIUM CHLORIDE 0.9 % IV SOLN
2.0000 g | Freq: Once | INTRAVENOUS | Status: AC
  Administered 2018-01-15: 2 g via INTRAVENOUS
  Filled 2018-01-15: qty 2

## 2018-01-15 MED ORDER — DIAZEPAM 5 MG PO TABS
5.0000 mg | ORAL_TABLET | Freq: Four times a day (QID) | ORAL | Status: DC
  Administered 2018-01-15 (×3): 5 mg via ORAL
  Filled 2018-01-15 (×3): qty 1

## 2018-01-15 MED ORDER — AZTREONAM 1 G IJ SOLR
1.0000 g | Freq: Three times a day (TID) | INTRAMUSCULAR | Status: DC
  Filled 2018-01-15: qty 1

## 2018-01-15 MED ORDER — ACETAMINOPHEN 325 MG PO TABS
650.0000 mg | ORAL_TABLET | Freq: Once | ORAL | Status: AC | PRN
  Administered 2018-01-15: 650 mg via ORAL
  Filled 2018-01-15: qty 2

## 2018-01-15 MED ORDER — GUAIFENESIN ER 600 MG PO TB12
1200.0000 mg | ORAL_TABLET | Freq: Two times a day (BID) | ORAL | Status: DC
  Administered 2018-01-15 – 2018-01-18 (×7): 1200 mg via ORAL
  Filled 2018-01-15 (×7): qty 2

## 2018-01-15 MED ORDER — SODIUM CHLORIDE 0.9 % IV SOLN
INTRAVENOUS | Status: DC
  Administered 2018-01-15 – 2018-01-17 (×4): via INTRAVENOUS

## 2018-01-15 MED ORDER — POLYETHYLENE GLYCOL 3350 17 G PO PACK: 17.0000 g | PACK | Freq: Every day | ORAL | Status: DC | PRN

## 2018-01-15 MED ORDER — GUAIFENESIN ER 600 MG PO TB12: 1200.0000 mg | ORAL_TABLET | Freq: Two times a day (BID) | ORAL | Status: DC | PRN

## 2018-01-15 MED ORDER — SODIUM CHLORIDE 0.9 % IV BOLUS
1000.0000 mL | Freq: Once | INTRAVENOUS | Status: AC
  Administered 2018-01-15: 1000 mL via INTRAVENOUS

## 2018-01-15 MED ORDER — FINASTERIDE 5 MG PO TABS
5.0000 mg | ORAL_TABLET | Freq: Every day | ORAL | Status: DC
  Administered 2018-01-15 – 2018-01-18 (×4): 5 mg via ORAL
  Filled 2018-01-15 (×4): qty 1

## 2018-01-15 MED ORDER — LEVOFLOXACIN IN D5W 750 MG/150ML IV SOLN
750.0000 mg | Freq: Once | INTRAVENOUS | Status: AC
  Administered 2018-01-15: 750 mg via INTRAVENOUS
  Filled 2018-01-15: qty 150

## 2018-01-15 NOTE — ED Triage Notes (Addendum)
Pt BIB EMS from home c/o fever and incr'd confusion/agitation. Family reports pt had fever of 101.60F at home. Has dementia and confused at baseline, but family reports incr'd agitation since fever. Pt has indwelling urinary catheter. On 2L O2 at home.

## 2018-01-15 NOTE — H&P (Addendum)
History and Physical    David Duke WER:[id number] DOB: 1935/01/19 DOA: 01/15/2018   PCP: Clovia Cuff, MD   Patient coming from:  Home    Chief Complaint: Confusion  HPI: David Duke is a 83 y.o. 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  EMS reported the patient to have a fever of 101.1.  Unfortunately, the patient is unable to provide reliable history due to dementia, accompanied by increased confusion.  At the time of evaluation, the patient is able to know his name, place, history of birth, but is unable to elaborate further.  He is able to answer very simple questions, denying any shortness of breath or chest pain.  No further information is able to be obtained, the patient becomes very agitated, because he cannot remember which medicines he needed to take today at home, and wants to take them.  ED Course:  BP (!) 120/50   Pulse 91   Temp 99.7 F (37.6 C) (Oral)   Resp (!) 21   Ht _0  (1.727 m)   Wt 62.1 kg (137 lb)   SpO2 95%   BMI 20.83 kg/m   Initial lactic acid was 2.28, now 1.17 after initiation of IV fluids and antibiotics. Urinalysis prior to antibiotics were positive for nitrite and negative for leukocytes. They permanent catheter shows no blood in urine, fairly clear. 139, potassium 3.6, glucose 96, creatinine 1.56.  Alk phosphatase 69.  Liver functions normal. White count 11.1, hemoglobin 13.8, platelets 217 PT 12, INR 0.9 Blood and urine  cultures pending Chest x-ray is negative for acute disease Last echo on 03/28/2017 shows EF 65 to 19%, systolic function was 5 vigorous, no regional wall motion abnormalities Most recent PSA on 10/17/2015 was 7.0  Review of Systems:  As per HPI otherwise all other systems reviewed and are negative  Past Medical History:  Diagnosis Date  . AAA (abdominal aortic aneurysm) (David Duke)   . AKI (acute kidney injury) (David Duke)   . Anxiety   . Blind right eye   . BPH (benign prostatic hypertrophy) with urinary retention   . COPD (chronic obstructive pulmonary disease) (David Duke)   . Dementia   . Depression   . High cholesterol   . Macular degeneration of both eyes   . Renal disorder   . Shortness of breath dyspnea   . Stroke [facility]) 11/2015   has residual slurred speech    Past Surgical History:  Procedure Laterality Date  . NO PAST SURGERIES      Social History Social History   Socioeconomic History  . Marital status: Married    Spouse name: Not on file  . Number of children: Not on file  . Years of education: Not on file  . Highest education level: Not on file  Occupational History  . Not on file  Social Needs  . Financial resource strain: Not hard at all  . Food insecurity:    Worry: Never true    Inability: Never true  . Transportation needs:    Medical: No  Non-medical: No  Tobacco Use  . Smoking status: Former Smoker    Packs/day: 0.25    Years: 60.00    Pack years: 15.00    Types: Cigarettes    Last attempt to quit: 11/22/2015    Years since quitting: 2.1  . Smokeless tobacco: Never Used  Substance and Sexual Activity  . Alcohol use: No  . Drug use: No  . Sexual activity: Never  Lifestyle  . Physical activity:    Days per week: 0 days    Minutes per session: Not on file  . Stress: Only a little  Relationships  . Social connections:    Talks on phone: Twice a week    Gets together: Never    Attends religious service: Never    Active member of club or organization:  No    Attends meetings of clubs or organizations: Never    Relationship status: Married  . Intimate partner violence:    Fear of current or ex partner: Not on file    Emotionally abused: Not on file    Physically abused: Not on file    Forced sexual activity: Not on file  Other Topics Concern  . Not on file  Social History Narrative  . Not on file     Allergies  Allergen Reactions  . Penicillins Hives, Diarrhea and Nausea And Vomiting    Has patient had a PCN reaction causing immediate rash, facial/tongue/throat swelling, SOB or lightheadedness with hypotension: Yes Has patient had a PCN reaction causing severe rash involving mucus membranes or skin necrosis: No Has patient had a PCN reaction that required hospitalization: No Has patient had a PCN reaction occurring within the last 10 years: No If all of the above answers are "NO", then may proceed with Cephalosporin use.   [name] Amoxicillin Hives, Diarrhea and Nausea And Vomiting  . Sulfa Antibiotics Nausea Only    Family History  Problem Relation Age of Onset  . Colon cancer Mother   . Cerebral aneurysm Father       Prior to Admission medications   Medication Sig Start Date End Date Taking? Authorizing Provider  acetaminophen (TYLENOL) 500 MG tablet Take 500 mg by mouth every 6 (six) hours as needed for mild pain, moderate pain or headache.    [provider]  albuterol (PROVENTIL HFA;VENTOLIN HFA) 108 (90 Base) MCG/ACT inhaler Inhale 1-2 puffs into the lungs every 6 (six) hours as needed for wheezing or shortness of breath. 12/28/16   Long, Wonda Olds, MD  albuterol (PROVENTIL) (2.5 MG/3ML) 0.083% nebulizer solution Take 2.5 mg by nebulization every 8 (eight) hours as needed for wheezing or shortness of breath.    [provider]  busPIRone (BUSPAR) 5 MG tablet Take 5 mg by mouth 2 (two) times daily.    [provider]  ciprofloxacin (CIPRO) 250 MG tablet Take 1 tablet (250 mg total) by mouth 2 (two)  times daily. 12/25/17   Patrecia Pour, MD  diazepam (VALIUM) 5 MG tablet Take 1 tablet (5 mg total) by mouth every 6 (six) hours. 12/25/17   Patrecia Pour, MD  finasteride (PROSCAR) 5 MG tablet Take 5 mg by mouth daily. 09/22/16   [provider]  fluticasone (FLONASE) 50 MCG/ACT nasal spray Place 1 spray into both nostrils daily. Patient taking differently: Place 1 spray into both nostrils daily as needed for allergies or rhinitis.  12/11/15   Love, Ivan Anchors, PA-C  guaiFENesin (MUCINEX) 600 MG 12 hr tablet Take 2  tablets (1,200 mg total) by mouth 2 (two) times daily. Patient taking differently: Take 1,200 mg by mouth 2 (two) times daily as needed for cough or to loosen phlegm.  09/11/17   Rosita Fire, MD  ipratropium-albuterol (DUONEB) 0.5-2.5 (3) MG/3ML SOLN Take 3 mLs by nebulization every 6 (six) hours as needed. 12/03/17   Hosie Poisson, MD  lansoprazole (PREVACID) 30 MG capsule Take 30 mg by mouth daily after breakfast.    [provider]  Multiple Vitamin (MULTIVITAMIN WITH MINERALS) TABS tablet Take 1 tablet by mouth daily.    [provider]  nicotine (NICODERM CQ - DOSED IN MG/24 HOURS) 14 mg/24hr patch Place 1 patch (14 mg total) onto the skin daily. 12/04/17   Hosie Poisson, MD  nystatin (MYCOSTATIN/NYSTOP) powder Apply topically as needed.     [provider]  ondansetron (ZOFRAN) 4 MG tablet Take 4 mg by mouth every 6 (six) hours as needed.    [provider]  Polyethyl Glycol-Propyl Glycol (SYSTANE) 0.4-0.3 % SOLN Apply 1 drop to eye as needed.    [provider]  polyethylene glycol (MIRALAX / GLYCOLAX) packet Take 17 g by mouth daily. Patient taking differently: Take 17 g by mouth daily as needed.  09/22/17   Patrecia Pour, MD  predniSONE (DELTASONE) 20 MG tablet Take 2 tablets (40 mg total) by mouth daily with breakfast. 12/26/17   Patrecia Pour, MD    Physical Exam:  Vitals:   01/15/18 0800 01/15/18 0815 01/15/18 0830  01/15/18 0845  BP: 112/79  124/62 (!) 120/50  Pulse: 91  94 91  Resp: 18  (!) 24 (!) 21  Temp:      TempSrc:      SpO2: 95% 95%  95%  Weight:      Height:       Constitutional: NAD, anxious appearing, less agitated from presentation.   Eyes: PERRL, lids and conjunctivae normal.  The patient has right external deviation  ENMT: Mucous membranes are moist, without exudate or lesions  Neck: normal, supple, no masses, no thyromegaly Respiratory: clear to auscultation bilaterally, no wheezing, no crackles. Normal respiratory effort  Cardiovascular: Regular rate and rhythm,  murmur, rubs or gallops. No extremity edema. 2+ pedal pulses. No carotid bruits.  Abdomen: Soft, non tender but mild discomfort noted on the suprapubic region, No hepatosplenomegaly. Bowel sounds positive.  Musculoskeletal: no clubbing / cyanosis. Moves all extremities Skin: no jaundice, No lesions.  Neurologic: Sensation intact  Strength equal in all extremities.  The patient is alert and oriented to person and place, no time. Psychiatric:   Alert and oriented x 2 anxious mood.     Labs on Admission: I have personally reviewed following labs and imaging studies  CBC: Recent Labs  Lab 01/15/18 0545  WBC 11.1*  NEUTROABS 8.8*  HGB 13.8  HCT 42.4  MCV 92.0  PLT 749    Basic Metabolic Panel: Recent Labs  Lab 01/15/18 0545  NA 139  K 3.6  CL 101  CO2 26  GLUCOSE 96  BUN 13  CREATININE 1.56*  CALCIUM 8.8*    GFR: Estimated Creatinine Clearance: 32.6 mL/min (A) (by C-G formula based on SCr of 1.56 mg/dL (H)).  Liver Function Tests: Recent Labs  Lab 01/15/18 0545  AST 29  ALT 38  ALKPHOS 69  BILITOT 0.6  PROT 6.3*  ALBUMIN 3.2*   No results for input(s): LIPASE, AMYLASE in the last 168 hours. No results for input(s): AMMONIA in the  last 168 hours.  Coagulation Profile: Recent Labs  Lab 01/15/18 0545  INR 0.90    Cardiac Enzymes: No results for input(s): CKTOTAL, CKMB, CKMBINDEX,  TROPONINI in the last 168 hours.  BNP (last 3 results) No results for input(s): PROBNP in the last 8760 hours.  HbA1C: No results for input(s): HGBA1C in the last 72 hours.  CBG: No results for input(s): GLUCAP in the last 168 hours.  Lipid Profile: No results for input(s): CHOL, HDL, LDLCALC, TRIG, CHOLHDL, LDLDIRECT in the last 72 hours.  Thyroid Function Tests: No results for input(s): TSH, T4TOTAL, FREET4, T3FREE, THYROIDAB in the last 72 hours.  Anemia Panel: No results for input(s): VITAMINB12, FOLATE, FERRITIN, TIBC, IRON, RETICCTPCT in the last 72 hours.  Urine analysis:    Component Value Date/Time   COLORURINE YELLOW 01/15/2018 0524   APPEARANCEUR CLEAR 01/15/2018 0524   LABSPEC 1.013 01/15/2018 0524   PHURINE 6.0 01/15/2018 0524   GLUCOSEU NEGATIVE 01/15/2018 0524   HGBUR MODERATE (A) 01/15/2018 0524   BILIRUBINUR NEGATIVE 01/15/2018 0524   KETONESUR NEGATIVE 01/15/2018 0524   PROTEINUR NEGATIVE 01/15/2018 0524   UROBILINOGEN 0.2 03/31/2015 1850   NITRITE POSITIVE (A) 01/15/2018 0524   LEUKOCYTESUR NEGATIVE 01/15/2018 0524    Sepsis Labs: _0 (procalcitonin:4,lacticidven:4) )No results found for this or any previous visit (from the past 240 hour(s)).   Radiological Exams on Admission: Dg Chest Port 1 View  Result Date: 01/15/2018 CLINICAL DATA:  83 year old male with fever and sepsis. EXAM: PORTABLE CHEST 1 VIEW COMPARISON:  Chest radiograph dated 12/22/2017 FINDINGS: Mild hyperinflation may represent COPD. The lungs are clear. There is no pleural effusion or pneumothorax. The cardiac silhouette is within normal limits. No acute osseous pathology. IMPRESSION: No active disease. Electronically Signed   By: Anner Crete M.D.   On: 01/15/2018 06:20    EKG: Independently reviewed.   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  This was changed 2 days ago, while the patient was in Cipro as outpatient.  Patient has a history of urinary tract infection in the recent past, growing Pseudomonas and enterococcus infection treated on 4/19 with meropenem and then Cipro,  QSOFA score 1 Initially with  tachycardia, tachypnea, fever, leukocytosis, and evidence of organ dysfunction with good response to therapy.  Antibiotics delivered in the ED initially with Vanc, Azactam and Levaquin. UA + Nitrities , Initial lactic acid was 2.28, now 1.17 after initiation of IV fluids and antibiotics.T max 100.7, now 99 .  White count initially 11.1.   EKG showed sinus tachycardia, without changes from prior., Tn neg Admit to Medsurg IP  Sepsis order set   IV antibiotics by pharmacy with  in am as patient received Vanc and Levaquin, patient allergic to penicillins (can take cephalosporins, pending on cultures, patient may take Rocephin if needed) Follow lactic acid Follow blood and urine cultures IV fluids at 100 cc/h.   No further boluses  Procalcitonin order set , if neg, will dc lactic acid monitoring  COPD without exacerbation Osats  CXR NAD  WBC  11,1  Continue nebs and O2  prn   Tobacco abuse , continues to smoke 4 cigs a day No Nicotine patch needed Cessation counseled   Chronic kidney disease stage 3 Cr 1.56, at baseline Lab Results  Component Value Date   CREATININE 1.56 (H) 01/15/2018   CREATININE 1.48 (H) 12/23/2017   CREATININE 1.53 (H) 12/22/2017  IVF Repeat BMET     History of CVA, with chronic ataxia and vascular dementia/history of anxiety depression Continue BuSpar, Valium Patient will need PT and OT once able to ambulate without difficulty. Dysphagia 3 diet continue  History of BPH, with urinary obstruction Continue Proscar Last Foley was changed on 01/12/2018 by urologist.    GERD, no acute symptoms Continue PPI   DVT prophylaxis:  Heparin sq Code Status:    Full  Family Communication:  Discussed with patient Disposition Plan: Expect patient to be discharged to home after condition improves. Wife main caregiver  Consults called:    None  Admission status: SDU IP    Sharene Butters, PA-C [facility]   Amion text  [phone]   01/15/2018, 9:03 AM   I have examined the patient, reviewed the chart and discussed the plan with Sharene Butters. He is a very poor historian who is fixated on getting his pill that he gets 4 x a day (Valium).   He comes in for fever and confusion. Has a h/o dementia, COPD with frequent exacerbations on 2 L at home, Chronic foley with prior pseudomonas and E faecalis UTI in March who lives at home with his wive.   His UA is +, foley was changed as noted above at the Urology office this week and will be treated for a UTI. He also has a possible LLL infiltrate and tells me he has a cough with colored sputum and therefore will need to treat for respiratory infection as well. Levaquin and Azactam are reasonable. He also received Vanc in the ED. Pseudomonas and Enterococcus were sensitive to Cipro previously.  Will order a CXR for tomorrow. O2 requirement is not increased from his baseline.  I am  concerned about his Buspar and Valium doses and, unfortunately,  the  drug database is not allowing [place] to log in. Will ask pharmacy to double check these. Meanwhile stop Buspar but cont Valium. He can be down graded to med surg.  He has thrush - will add Nystatin.  I will request a palliative care eval for frequent admissions. PT eval ordered.   Debbe Odea, MD Pager: [email or web address]

## 2018-01-15 NOTE — ED Notes (Signed)
ED Provider at bedside. 

## 2018-01-15 NOTE — Progress Notes (Signed)
Pharmacy Antibiotic Note  David Duke is a 83 y.o. male admitted on 01/15/2018 with sepsis.  Pharmacy has been consulted for Vancomycin/Aztreonam/Levaquin dosing. WBC mildly elevated. Noted renal dysfunction.   Plan: Vancomycin 1000 mg IV q24h Aztreonam 1g IV q8h Levaquin 500 mg IV q24h Trend WBC, temp, renal function  F/U infectious work-up Drug levels as indicated   Height:  (172.7 cm) Weight: 137 lb (62.1 kg) IBW/kg (Calculated) : 68.4  Temp (24hrs), Avg:100.2 F (37.9 C), Min:99.7 F (37.6 C), Max:100.7 F (38.2 C)  Recent Labs  Lab 01/15/18 0535 01/15/18 0545  WBC  --  11.1*  CREATININE  --  1.56*  LATICACIDVEN 2.28*  --     Estimated Creatinine Clearance: 32.6 mL/min (A) (by C-G formula based on SCr of 1.56 mg/dL (H)).    Allergies  Allergen Reactions  . Penicillins Hives, Diarrhea and Nausea And Vomiting    Has patient had a PCN reaction causing immediate rash, facial/tongue/throat swelling, SOB or lightheadedness with hypotension: Yes Has patient had a PCN reaction causing severe rash involving mucus membranes or skin necrosis: No Has patient had a PCN reaction that required hospitalization: No Has patient had a PCN reaction occurring within the last 10 years: No If all of the above answers are "NO", then may proceed with Cephalosporin use.   [name] Amoxicillin Hives, Diarrhea and Nausea And Vomiting  . Sulfa Antibiotics Nausea Only    David Duke 01/15/2018 7:04 AM

## 2018-01-15 NOTE — ED Provider Notes (Signed)
[facility] EMERGENCY DEPARTMENT Provider Note   CSN: [id number] Arrival date & time: 01/15/18  0457     History   Chief Complaint Chief Complaint  Patient presents with  . Altered Mental Status  . Urinary Tract Infection    HPI David Duke is a 83 y.o. male.  The history is provided by the EMS personnel. The history is limited by the condition of the patient (Altered mental status).  He has history of dementia, hyperlipidemia, stroke, chronic kidney disease, abdominal aortic aneurysm and came in by ambulance because family noted he was increasingly confused and agitated.  He is reported to have had a fever of 101.1 degrees at home.  He does have an indwelling Foley catheter.  Patient is not able to give any reliable history.  Past Medical History:  Diagnosis Date  . AAA (abdominal aortic aneurysm) (HCC)   . AKI (acute kidney injury) (HCC)   . Anxiety   . Blind right eye   . BPH (benign prostatic hypertrophy) with urinary retention   . COPD (chronic obstructive pulmonary disease) (HCC)   . Dementia   . Depression   . High cholesterol   . Macular degeneration of both eyes   . Renal disorder   . Shortness of breath dyspnea   . Stroke [facility]) 11/2015   has residual slurred speech    Patient Active Problem List   Diagnosis Date Noted  . CKD (chronic kidney disease) stage 3, GFR 30-59 ml/min (HCC) 12/22/2017  . Catheter-associated urinary tract infection (HCC) 12/22/2017  . COPD with acute exacerbation (HCC) 11/30/2017  . UTI (urinary tract infection) 11/23/2017  . HCAP (healthcare-associated pneumonia) 09/18/2017  . Sepsis (HCC)   . Acute respiratory failure with hypoxia (HCC) 09/07/2017  . Rhinitis, chronic 09/07/2017  . Malnutrition of moderate degree 09/07/2017  . Elevated troponin 03/27/2017  . Diplopia 03/27/2017  . Bilateral leg edema 03/27/2017  . Enterocolitis 09/30/2016  . HLD (hyperlipidemia) 09/30/2016  . Tobacco abuse 09/30/2016    . Nausea vomiting and diarrhea 09/30/2016  . History of stroke in prior 3 months 01/22/2016  . Chronic tension-type headache, not intractable   . Acute frontal sinusitis   . 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 Gait disturbance, post-stroke   . Ataxia, late effect of cerebrovascular disease   . BPH (benign prostatic hyperplasia)   . Urinary retention   . Adjustment disorder with mixed anxiety and depressed mood   . Chronic obstructive pulmonary disease (HCC)   . Cephalalgia   . Acute kidney injury superimposed on chronic kidney disease (HCC)   . Macular degeneration   . Prediabetes   . Hypokalemia   . Absolute anemia   . Aphasia   . Acute encephalopathy 11/25/2015  . Hyponatremia 11/25/2015  . Acute kidney injury (HCC) 11/25/2015  . Depression   . Anxiety   . COPD (chronic obstructive pulmonary disease) (HCC)   . ICH (intracerebral hemorrhage) (HCC) 11/22/2015    Past Surgical History:  Procedure Laterality Date  . NO PAST SURGERIES          Home Medications    Prior to Admission medications   Medication Sig Start Date End Date Taking? Authorizing Provider  acetaminophen (TYLENOL) 500 MG tablet Take 500 mg by mouth every 6 (six) hours as needed for mild pain, moderate pain or headache.    [provider]  albuterol (PROVENTIL HFA;VENTOLIN HFA) 108 (90 Base) MCG/ACT inhaler Inhale 1-2 puffs into the  lungs every 6 (six) hours as needed for wheezing or shortness of breath. 12/28/16   Long, Arlyss Repress, MD  albuterol (PROVENTIL) (2.5 MG/3ML) 0.083% nebulizer solution Take 2.5 mg by nebulization every 8 (eight) hours as needed for wheezing or shortness of breath.    [provider]  busPIRone (BUSPAR) 5 MG tablet Take 5 mg by mouth 2 (two) times daily.    [provider]  ciprofloxacin (CIPRO) 250 MG tablet Take 1 tablet (250 mg total) by mouth 2 (two) times daily. 12/25/17   [name], MD  diazepam (VALIUM) 5 MG tablet  Take 1 tablet (5 mg total) by mouth every 6 (six) hours. 12/25/17   [name], MD  finasteride (PROSCAR) 5 MG tablet Take 5 mg by mouth daily. 09/22/16   [provider]  fluticasone (FLONASE) 50 MCG/ACT nasal spray Place 1 spray into both nostrils daily. Patient taking differently: Place 1 spray into both nostrils daily as needed for allergies or rhinitis.  12/11/15   Love, [name], PA-C  guaiFENesin (MUCINEX) 600 MG 12 hr tablet Take 2 tablets (1,200 mg total) by mouth 2 (two) times daily. Patient taking differently: Take 1,200 mg by mouth 2 (two) times daily as needed for cough or to loosen phlegm.  09/11/17   [name], MD  ipratropium-albuterol (DUONEB) 0.5-2.5 (3) MG/3ML SOLN Take 3 mLs by nebulization every 6 (six) hours as needed. 12/03/17   [name], MD  lansoprazole (PREVACID) 30 MG capsule Take 30 mg by mouth daily after breakfast.    [provider]  Multiple Vitamin (MULTIVITAMIN WITH MINERALS) TABS tablet Take 1 tablet by mouth daily.    [provider]  nicotine (NICODERM CQ - DOSED IN MG/24 HOURS) 14 mg/24hr patch Place 1 patch (14 mg total) onto the skin daily. 12/04/17   [name], MD  nystatin (MYCOSTATIN/NYSTOP) powder Apply topically as needed.     [provider]  ondansetron (ZOFRAN) 4 MG tablet Take 4 mg by mouth every 6 (six) hours as needed.    [provider]  Polyethyl Glycol-Propyl Glycol (SYSTANE) 0.4-0.3 % SOLN Apply 1 drop to eye as needed.    [provider]  polyethylene glycol (MIRALAX / GLYCOLAX) packet Take 17 g by mouth daily. Patient taking differently: Take 17 g by mouth daily as needed.  09/22/17   [name], MD  predniSONE (DELTASONE) 20 MG tablet Take 2 tablets (40 mg total) by mouth daily with breakfast. 12/26/17   [name], MD    Family History Family History  Problem Relation Age of Onset  . Colon cancer Mother   . Cerebral aneurysm Father     Social History Social  History   Tobacco Use  . Smoking status: Former Smoker    Packs/day: 0.25    Years: 60.00    Pack years: 15.00    Types: Cigarettes    Last attempt to quit: 11/22/2015    Years since quitting: 2.1  . Smokeless tobacco: Never Used  Substance Use Topics  . Alcohol use: No  . Drug use: No     Allergies   Penicillins; Amoxicillin; and Sulfa antibiotics   Review of Systems Review of Systems  Unable to perform ROS: Mental status change     Physical Exam Updated Vital Signs BP (!) 133/101 (BP Location: Right Arm)   Pulse (!) 101   Temp (S) (!) 100.7 F (38.2 C) (Oral)   Resp (!) 25   Ht   (1.727 m)   Wt 62.1 kg (137 lb)   SpO2 97%   BMI 20.83 kg/m   Physical Exam  Nursing note and vitals reviewed.  83 year old male, agitated, but in no acute distress. Vital signs are significant for fever, tachypnea, elevated diastolic blood pressure, borderline elevated heart rate. Oxygen saturation is 97%, which is normal. Head is normocephalic and atraumatic. PERRLA, EOMI. Oropharynx is clear. Neck is nontender and supple without adenopathy or JVD. Back is nontender and there is no CVA tenderness. Lungs are clear without rales, wheezes, or rhonchi. Chest is nontender. Heart has regular rate and rhythm without murmur. Abdomen is soft, flat, nontender without masses or hepatosplenomegaly and peristalsis is normoactive. Extremities have no cyanosis or edema, full range of motion is present. Skin is warm and dry without rash. Neurologic: Awake and alert and oriented to person and place but not time, cranial nerves are intact, there are no motor or sensory deficits.  ED Treatments / Results  Labs (all labs ordered are listed, but only abnormal results are displayed) Labs Reviewed  COMPREHENSIVE METABOLIC PANEL - Abnormal; Notable for the following components:      Result Value   Creatinine, Ser 1.56 (*)    Calcium 8.8 (*)    Total Protein 6.3 (*)    Albumin 3.2 (*)    GFR  calc non Af Amer 40 (*)    GFR calc Af Amer 46 (*)    All other components within normal limits  CBC WITH DIFFERENTIAL/PLATELET - Abnormal; Notable for the following components:   WBC 11.1 (*)    Neutro Abs 8.8 (*)    All other components within normal limits  URINALYSIS, ROUTINE W REFLEX MICROSCOPIC - Abnormal; Notable for the following components:   Hgb urine dipstick MODERATE (*)    Nitrite POSITIVE (*)    Bacteria, UA FEW (*)    All other components within normal limits  I-STAT CG4 LACTIC ACID, ED - Abnormal; Notable for the following components:   Lactic Acid, Venous 2.28 (*)    All other components within normal limits  CULTURE, BLOOD (ROUTINE X 2)  CULTURE, BLOOD (ROUTINE X 2)  URINE CULTURE  PROTIME-INR  I-STAT CG4 LACTIC ACID, ED  I-STAT CG4 LACTIC ACID, ED  I-STAT CG4 LACTIC ACID, ED    EKG EKG Interpretation  Date/Time:  Friday Jan 15 2018 05:09:37 EDT Ventricular Rate:  101 PR Interval:    QRS Duration: 110 QT Interval:  318 QTC Calculation: 413 R Axis:   -93 Text Interpretation:  Sinus tachycardia Right ventricular hypertrophy Inferior infarct, old Right bundle branch block Artifact When compared with ECG of 12/22/2017, No significant change was found Confirmed by Dione Booze ([id number]) on 01/15/2018 5:30:05 AM   Radiology Dg Chest Port 1 View  Result Date: 01/15/2018 CLINICAL DATA:  83 year old male with fever and sepsis. EXAM: PORTABLE CHEST 1 VIEW COMPARISON:  Chest radiograph dated 12/22/2017 FINDINGS: Mild hyperinflation may represent COPD. The lungs are clear. There is no pleural effusion or pneumothorax. The cardiac silhouette is within normal limits. No acute osseous pathology. IMPRESSION: No active disease. Electronically Signed   By: Elgie Collard M.D.   On: 01/15/2018 06:20    Procedures Procedures  CRITICAL CARE Performed by: Dione Booze Total critical care time: 85 minutes Critical care time was exclusive of separately billable procedures and  treating other patients. Critical care was necessary to treat or prevent imminent or life-threatening deterioration. Critical care was time spent personally by  me on the following activities: development of treatment plan with patient and/or surrogate as well as nursing, discussions with consultants, evaluation of patient's response to treatment, examination of patient, obtaining history from patient or surrogate, ordering and performing treatments and interventions, ordering and review of laboratory studies, ordering and review of radiographic studies, pulse oximetry and re-evaluation of patient's condition.  Medications Ordered in ED Medications  vancomycin (VANCOCIN) IVPB 1000 mg/200 mL premix (has no administration in time range)  levofloxacin (LEVAQUIN) IVPB 500 mg (has no administration in time range)  aztreonam (AZACTAM) 1 g in sodium chloride 0.9 % 100 mL IVPB (has no administration in time range)  acetaminophen (TYLENOL) tablet 650 mg (650 mg Oral Given 01/15/18 0534)  levofloxacin (LEVAQUIN) IVPB 750 mg (750 mg Intravenous New Bag/Given 01/15/18 0556)  aztreonam (AZACTAM) 2 g in sodium chloride 0.9 % 100 mL IVPB (2 g Intravenous New Bag/Given 01/15/18 0703)  vancomycin (VANCOCIN) IVPB 1000 mg/200 mL premix (0 mg Intravenous Stopped 01/15/18 0656)  sodium chloride 0.9 % bolus 1,000 mL (0 mLs Intravenous Stopped 01/15/18 0653)  sodium chloride 0.9 % bolus 1,000 mL (1,000 mLs Intravenous New Bag/Given 01/15/18 0656)     Initial Impression / Assessment and Plan / ED Course  I have reviewed the triage vital signs and the nursing notes.  Pertinent labs & imaging results that were available during my care of the patient were reviewed by me and considered in my medical decision making (see chart for details).  Fever with agitation and confusion.  Sepsis work-up initiated.  Most likely source is urine given an indwelling Foley catheter.  Old records are reviewed, and he saw his urologist 3 days ago  and had Foley catheter replaced at that visit.  He does have past hospitalizations for urinary tract infection.  He is empirically started on antibiotics for sepsis of undetermined source.  Labs show renal insufficiency which is not significantly changed from baseline.  Urine shows clear evidence of infection, and is apparently the source of his fever.  Lactic acid is mildly elevated, so he is started on early, goal directed fluid therapy.  Chest x-ray does raise possibility of left basilar pneumonia.  However, clinically, he does not have pneumonia.  He has been started on appropriate antibiotics.  Case is discussed with Durenda Age [facility], who agrees to admit the patient.  Final Clinical Impressions(s) / ED Diagnoses   Final diagnoses:  Sepsis, due to unspecified organism [facility])  Urinary tract infection with hematuria, site unspecified  Renal insufficiency    ED Discharge Orders    None       Dione Booze, MD 01/15/18 [phone]

## 2018-01-16 ENCOUNTER — Inpatient Hospital Stay (HOSPITAL_COMMUNITY): Payer: Medicare Other

## 2018-01-16 DIAGNOSIS — F419 Anxiety disorder, unspecified: Secondary | ICD-10-CM

## 2018-01-16 DIAGNOSIS — A419 Sepsis, unspecified organism: Secondary | ICD-10-CM

## 2018-01-16 DIAGNOSIS — Z515 Encounter for palliative care: Secondary | ICD-10-CM

## 2018-01-16 DIAGNOSIS — Z7189 Other specified counseling: Secondary | ICD-10-CM

## 2018-01-16 LAB — CBC
HEMATOCRIT: 37.5 % — AB (ref 39.0–52.0)
Hemoglobin: 11.9 g/dL — ABNORMAL LOW (ref 13.0–17.0)
MCH: 29.4 pg (ref 26.0–34.0)
MCHC: 31.7 g/dL (ref 30.0–36.0)
MCV: 92.6 fL (ref 78.0–100.0)
Platelets: 154 10*3/uL (ref 150–400)
RBC: 4.05 MIL/uL — ABNORMAL LOW (ref 4.22–5.81)
RDW: 14.3 % (ref 11.5–15.5)
WBC: 7.7 10*3/uL (ref 4.0–10.5)

## 2018-01-16 LAB — BASIC METABOLIC PANEL
Anion gap: 6 (ref 5–15)
BUN: 15 mg/dL (ref 6–20)
CHLORIDE: 111 mmol/L (ref 101–111)
CO2: 24 mmol/L (ref 22–32)
Calcium: 7.9 mg/dL — ABNORMAL LOW (ref 8.9–10.3)
Creatinine, Ser: 1.23 mg/dL (ref 0.61–1.24)
GFR calc Af Amer: >60 mL/min (ref >60)
GFR calc non Af Amer: 53 mL/min — ABNORMAL LOW (ref >60)
GLUCOSE: 92 mg/dL (ref 65–99)
Potassium: 3.8 mmol/L (ref 3.5–5.1)
SODIUM: 141 mmol/L (ref 135–145)

## 2018-01-16 MED ORDER — PREDNISONE 10 MG PO TABS
10.0000 mg | ORAL_TABLET | Freq: Every day | ORAL | Status: AC
  Administered 2018-01-16 – 2018-01-18 (×3): 10 mg via ORAL
  Filled 2018-01-16 (×3): qty 1

## 2018-01-16 MED ORDER — AZTREONAM 1 G IJ SOLR
1.0000 g | Freq: Three times a day (TID) | INTRAMUSCULAR | Status: DC
  Administered 2018-01-16 – 2018-01-17 (×3): 1 g via INTRAVENOUS
  Filled 2018-01-16 (×4): qty 1

## 2018-01-16 MED ORDER — DIAZEPAM 5 MG PO TABS
5.0000 mg | ORAL_TABLET | Freq: Four times a day (QID) | ORAL | Status: DC
  Administered 2018-01-16 – 2018-01-18 (×10): 5 mg via ORAL
  Filled 2018-01-16 (×10): qty 1

## 2018-01-16 MED ORDER — ALBUTEROL SULFATE (2.5 MG/3ML) 0.083% IN NEBU
2.5000 mg | INHALATION_SOLUTION | Freq: Four times a day (QID) | RESPIRATORY_TRACT | Status: DC | PRN
  Administered 2018-01-16 – 2018-01-18 (×4): 2.5 mg via RESPIRATORY_TRACT
  Filled 2018-01-16 (×4): qty 3

## 2018-01-16 NOTE — Progress Notes (Signed)
[facility] Progress Note  Patient: David Duke OQH:[id number]   PCP: Clovia Cuff, MD DOB: [birth year]   DOA: 01/15/2018   DOS: 01/16/2018   Date of Service: the patient was seen and examined on 01/16/2018  Subjective: Feeling better.  No nausea no vomiting.  Still has cough.  No diarrhea.  Brief hospital course: Pt. with PMH of  COPD with recent admission for exacerbation, tobacco use, stage III CKD, anxiety, GERD, history of BPH, urinary retention with chronic indwelling Foley catheter last changed by urologist 2 days ago; admitted on 01/15/2018, presented with complaint of confusion, was found to have possible UTI, possible pneumonia. Currently further plan is continue IV antibiotics.  Assessment and Plan: 1.  Enterococcus UTI. Community-acquired pneumonia. Chronic indwelling Foley catheter. Foley catheter already changed 2 days ago. Met Sirs criteria on admission. Possibly more likely dehydration. Currently started on IV vancomycin and Levaquin. Urine culture has eaten enterococcus growing although I suspect this may be only a colonizer. Chest x-ray shows evidence of possible aspiration pneumonitis. Versus pneumonia continue with Levaquin. Follow-up on culture.  2.  COPD without exacerbation. Patient was on a tapering dose of prednisone, will continue 10 mg daily for 3 days to finish the course per family. Continue nebulizers. Continue oxygen.  3.  Active smoker. Recommended to stop smoking  4.  Chronic kidney disease stage III. Mild acute kidney injury. Renal function getting better with hydration. Monitor.  Diet: Cardiac diet DVT Prophylaxis: subcutaneous Heparin  Advance goals of care discussion: full code  Family Communication: no family was present at bedside, at the time of interview.   Disposition:  Discharge to home.  Consultants: none Procedures: none  Antibiotics: Anti-infectives (From admission, onward)   Start     Dose/Rate Route Frequency  Ordered Stop   01/16/18 1200  aztreonam (AZACTAM) 1 g in sodium chloride 0.9 % 100 mL IVPB     1 g 200 mL/hr over 30 Minutes Intravenous Every 8 hours 01/16/18 0504     01/16/18 0600  levofloxacin (LEVAQUIN) IVPB 500 mg  Status:  Discontinued     500 mg 100 mL/hr over 60 Minutes Intravenous Every 24 hours 01/15/18 0707 01/15/18 0803   01/16/18 0600  levofloxacin (LEVAQUIN) IVPB 750 mg     750 mg 100 mL/hr over 90 Minutes Intravenous Every 24 hours 01/15/18 1701     01/16/18 0500  vancomycin (VANCOCIN) IVPB 1000 mg/200 mL premix  Status:  Discontinued     1,000 mg 200 mL/hr over 60 Minutes Intravenous Every 24 hours 01/15/18 0707 01/15/18 0803   01/15/18 1930  aztreonam (AZACTAM) 1 g in sodium chloride 0.9 % 100 mL IVPB  Status:  Discontinued     1 g 200 mL/hr over 30 Minutes Intravenous Every 8 hours 01/15/18 1848 01/16/18 0504   01/15/18 1715  aztreonam (AZACTAM) injection 2 g  Status:  Discontinued     2 g Intramuscular Every 8 hours 01/15/18 1701 01/15/18 1841   01/15/18 1400  aztreonam (AZACTAM) 1 g in sodium chloride 0.9 % 100 mL IVPB  Status:  Discontinued     1 g 200 mL/hr over 30 Minutes Intravenous Every 8 hours 01/15/18 0707 01/15/18 0803   01/15/18 0545  levofloxacin (LEVAQUIN) IVPB 750 mg     750 mg 100 mL/hr over 90 Minutes Intravenous  Once 01/15/18 0537 01/15/18 0807   01/15/18 0545  aztreonam (AZACTAM) 2 g in sodium chloride 0.9 % 100 mL IVPB     2 g 200 mL/hr  over 30 Minutes Intravenous  Once 01/15/18 0537 01/15/18 0807   01/15/18 0545  vancomycin (VANCOCIN) IVPB 1000 mg/200 mL premix     1,000 mg 200 mL/hr over 60 Minutes Intravenous  Once 01/15/18 0537 01/15/18 0656       Objective: Physical Exam: Vitals:   01/15/18 2215 01/15/18 2231 01/16/18 0517 01/16/18 0946  BP: 127/73  130/79 (!) 141/63  Pulse: 72  67 87  Resp: 18  16 20   Temp: (!) 97.5 F (36.4 C)  (!) 97.4 F (36.3 C) 97.8 F (36.6 C)  TempSrc: Oral  Oral Oral  SpO2: 100% 99% 100% 99%  Weight:       Height:        Intake/Output Summary (Last 24 hours) at 01/16/2018 1427 Last data filed at 01/16/2018 5883 Gross per 24 hour  Intake 3713.33 ml  Output 1650 ml  Net 2063.33 ml   Filed Weights   01/15/18 0506  Weight: 62.1 kg (137 lb)   General: Alert, Awake and Oriented to Time, Place and Person. Appear in mild distress, affect appropriate Eyes: PERRL, Conjunctiva normal ENT: Oral Mucosa clear moist. Neck: no JVD, no Abnormal Mass Or lumps Cardiovascular: S1 and S2 Present, no Murmur, Peripheral Pulses Present Respiratory: normal respiratory effort, Bilateral Air entry equal and Decreased, no use of accessory muscle, Clear to Auscultation, no Crackles, no wheezes Abdomen: Bowel Sound present, Soft and no tenderness, no hernia Skin: no redness, no Rash, no induration Extremities: no Pedal edema, no calf tenderness Neurologic: Grossly no focal neuro deficit. Bilaterally Equal motor strength  Data Reviewed: CBC: Recent Labs  Lab 01/15/18 0545 01/16/18 0321  WBC 11.1* 7.7  NEUTROABS 8.8*  --   HGB 13.8 11.9*  HCT 42.4 37.5*  MCV 92.0 92.6  PLT 217 254   Basic Metabolic Panel: Recent Labs  Lab 01/15/18 0545 01/16/18 0321  NA 139 141  K 3.6 3.8  CL 101 111  CO2 26 24  GLUCOSE 96 92  BUN 13 15  CREATININE 1.56* 1.23  CALCIUM 8.8* 7.9*    Liver Function Tests: Recent Labs  Lab 01/15/18 0545  AST 29  ALT 38  ALKPHOS 69  BILITOT 0.6  PROT 6.3*  ALBUMIN 3.2*   No results for input(s): LIPASE, AMYLASE in the last 168 hours. No results for input(s): AMMONIA in the last 168 hours. Coagulation Profile: Recent Labs  Lab 01/15/18 0545  INR 0.90   Cardiac Enzymes: No results for input(s): CKTOTAL, CKMB, CKMBINDEX, TROPONINI in the last 168 hours. BNP (last 3 results) No results for input(s): PROBNP in the last 8760 hours. CBG: No results for input(s): GLUCAP in the last 168 hours. Studies: Dg Chest 2 View  Result Date: 01/16/2018 CLINICAL DATA:   83 year old male with a history of fever and productive cough EXAM: CHEST - 2 VIEW COMPARISON:  01/15/2018 FINDINGS: Cardiomediastinal silhouette unchanged in size and contour. No evidence of central vascular congestion. Lateral view demonstrates opacity at the lung base posteriorly, new from the comparison lateral of 12/22/2017. No displaced fracture. Stigmata of emphysema, with increased retrosternal airspace, flattened hemidiaphragms, increased AP diameter, and hyperinflation on the AP view. Degenerative changes of the spine. IMPRESSION: New opacity at the posterior lung base on the lateral view, concerning for pneumonia and/or aspiration pneumonitis. Electronically Signed   By: Corrie Mckusick D.O.   On: 01/16/2018 12:47    Scheduled Meds: . diazepam  5 mg Oral Q6H  . finasteride  5 mg Oral Daily  . guaiFENesin  1,200 mg Oral BID  . heparin  5,000 Units Subcutaneous Q8H  . multivitamin with minerals  1 tablet Oral Daily  . nystatin  5 mL Mouth/Throat QID  . pantoprazole  40 mg Oral Daily  . predniSONE  10 mg Oral Q breakfast   Continuous Infusions: . sodium chloride 100 mL/hr at 01/16/18 0749  . aztreonam Stopped (01/16/18 1136)  . levofloxacin (LEVAQUIN) IV 750 mg (01/16/18 0514)   PRN Meds: acetaminophen **OR** acetaminophen, albuterol, fluticasone, HYDROcodone-acetaminophen, ondansetron **OR** ondansetron (ZOFRAN) IV, polyethylene glycol, polyvinyl alcohol, senna-docusate  Time spent: 35 minutes  Author: Berle Mull, MD [facility] Pager: [phone] 01/16/2018 2:27 PM  If 7PM-7AM, please contact night-coverage at [email or web address], password [facility]

## 2018-01-16 NOTE — Evaluation (Signed)
Physical Therapy Evaluation Patient Details Name: David Duke MRN: [id number] DOB: March 21, 1935 Today's Date: 01/16/2018   History of Present Illness  Pt. with PMH of  COPD with recent admission for exacerbation, tobacco use, stage III CKD, anxiety, GERD, history of BPH, urinary retention with chronic indwelling Foley catheter last changed by urologist 2 days ago; admitted on 01/15/2018, presented with complaint of confusion, was found to have possible UTI, possible pneumonia.     Clinical Impression  Pt admitted with above diagnosis. Pt currently with functional limitations due to the deficits listed below (see PT Problem List). PTA, pt living with wife limited with dementia and hx of prior stroke. Per chart, wife caretaker, although not present to provide history. Upon eval patient with dementia and confusion, word finding dificulty, balance impairments. Min guard for OOB mobility, progressing to close stand by assist with RW for ambulation. Pt de sat to 88% on RA with activity this visit. Pt may be close to baseline, progress to home with HHPT provided wife can offer 24/7 hands on support at home, will look to confirm next PT session. Pt will benefit from skilled PT to increase their independence and safety with mobility to allow discharge to the venue listed below.       Follow Up Recommendations Home health PT;Supervision for mobility/OOB    Equipment Recommendations  None recommended by PT    Recommendations for Other Services OT consult     Precautions / Restrictions Precautions Precautions: Fall Restrictions Weight Bearing Restrictions: No      Mobility  Bed Mobility Overal bed mobility: Needs Assistance Bed Mobility: Supine to Sit     Supine to sit: Supervision     General bed mobility comments: supervision for safety, increased time and effort  Transfers Overall transfer level: Needs assistance Equipment used: Rolling walker (2 wheeled) Transfers: Sit to/from  Stand Sit to Stand: Min guard         General transfer comment: min guard for safety, hand placement on RW. unsafe to transfer by himself at this point  Ambulation/Gait Ambulation/Gait assistance: Min guard;Supervision Ambulation Distance (Feet): 150 Feet Assistive device: Rolling walker (2 wheeled);None Gait Pattern/deviations: Step-through pattern;Decreased stride length;Trunk flexed;Narrow base of support;Ataxic Gait velocity: decreased   General Gait Details: patient ambulating hallway with min guard without AD, unsteady, safer with RW at this time. pt reports he doesnt use RW, but unreliable historian due to dementia. no family member present. close stand by assist with RW  Stairs            Wheelchair Mobility    Modified Rankin (Stroke Patients Only)       Balance Overall balance assessment: Needs assistance Sitting-balance support: No upper extremity supported Sitting balance-Leahy Scale: Good     Standing balance support: During functional activity;Bilateral upper extremity supported Standing balance-Leahy Scale: Poor Standing balance comment: safer with BUE support                             Pertinent Vitals/Pain Pain Assessment: No/denies pain    Home Living Family/patient expects to be discharged to:: Private residence Living Arrangements: Spouse/significant other Available Help at Discharge: Family;Available 24 hours/day Type of Home: House Home Access: Stairs to enter Entrance Stairs-Rails: Right;Left;Can reach both Entrance Stairs-Number of Steps: 4 Home Layout: One level Home Equipment: Emergency planning/management officer - 2 wheels Additional Comments: Wife provides 24/7 supervision    Prior Function Level of Independence: Needs assistance   Gait /  Transfers Assistance Needed: Indep with ambulation; notes worsening "shaking" and weakness over past few days. Has had a couple falls at home recently  ADL's / Homemaking Assistance Needed: Reports  indep with ADLs; wife provides intermittent assist        Hand Dominance   Dominant Hand: Right    Extremity/Trunk Assessment   Upper Extremity Assessment Upper Extremity Assessment: Overall WFL for tasks assessed    Lower Extremity Assessment Lower Extremity Assessment: Overall WFL for tasks assessed    Cervical / Trunk Assessment Cervical / Trunk Assessment: Kyphotic  Communication   Communication: HOH  Cognition Arousal/Alertness: Awake/alert Behavior During Therapy: WFL for tasks assessed/performed Overall Cognitive Status: No family/caregiver present to determine baseline cognitive functioning Area of Impairment: Attention;Following commands;Safety/judgement;Problem solving                   Current Attention Level: Sustained Memory: Decreased short-term memory Following Commands: Follows one step commands consistently;Follows multi-step commands inconsistently Safety/Judgement: Decreased awareness of deficits;Decreased awareness of safety Awareness: Emergent Problem Solving: Difficulty sequencing;Requires verbal cues General Comments: h/o dementia, wife not present this session      General Comments      Exercises     Assessment/Plan    PT Assessment Patient needs continued PT services  PT Problem List Decreased strength;Decreased mobility;Decreased safety awareness;Decreased coordination;Decreased activity tolerance;Decreased balance;Decreased knowledge of use of DME       PT Treatment Interventions Therapeutic activities;Therapeutic exercise;Gait training;Patient/family education;Balance training;Neuromuscular re-education;Functional mobility training;DME instruction;Stair training    PT Goals (Current goals can be found in the Care Plan section)  Acute Rehab PT Goals Patient Stated Goal: non stated PT Goal Formulation: With patient Time For Goal Achievement: 01/30/18 Potential to Achieve Goals: Good    Frequency Min 3X/week   Barriers to  discharge        Co-evaluation               AM-PAC PT "6 Clicks" Daily Activity  Outcome Measure Difficulty turning over in bed (including adjusting bedclothes, sheets and blankets)?: None Difficulty moving from lying on back to sitting on the side of the bed? : None Difficulty sitting down on and standing up from a chair with arms (e.g., wheelchair, bedside commode, etc,.)?: Unable Help needed moving to and from a bed to chair (including a wheelchair)?: A Little Help needed walking in hospital room?: A Little Help needed climbing 3-5 steps with a railing? : A Little 6 Click Score: 18    End of Session Equipment Utilized During Treatment: Gait belt Activity Tolerance: Patient tolerated treatment well Patient left: in chair;with call bell/phone within reach Nurse Communication: Mobility status PT Visit Diagnosis: Other abnormalities of gait and mobility (R26.89);Unsteadiness on feet (R26.81)    Time: 1700-1725 PT Time Calculation (min) (ACUTE ONLY): 25 min   Charges:   PT Evaluation $PT Eval Low Complexity: 1 Low PT Treatments $Gait Training: 8-22 mins   PT G Codes:        Etta Grandchild, PT, DPT Acute Rehab Services Pager: [phone]    Etta Grandchild 01/16/2018, 5:54 PM

## 2018-01-16 NOTE — Consult Note (Signed)
Consultation Note Date: 01/16/2018   Patient Name: David Duke  DOB: Jan 24, 1935  MRN: [id number]  Age / Sex: 83 y.o., male  PCP: Clovia Cuff, MD Referring Physician: Lavina Hamman, MD  Reason for Consultation: Establishing goals of care and Psychosocial/spiritual support  HPI/Patient Profile: 83 y.o. male  with past medical history of COPD, chronic kidney disease stage III, GERD, BPH, history of hemorrhagic CVA 2017, neurogenic bladder (chronic indwelling Foley catheter secondary to severe urinary retention), macular degeneration AAA admitted on 01/15/2018 with confusion and agitation.  Patient was found to have another urinary tract infection and was admitted for sepsis.  Patient has had frequent admissions secondary to recurrent urinary tract infections.  When he was hospitalized in April cultures were positive for Pseudomonas and enterococcus.  Consult ordered for goals of care.   Clinical Assessment and Goals of Care: Met with patient, chart reviewed.  Also spoke to his wife via phone.  Patient was able to tell me accurately that he was in [facility] and with prompting articulate that he has another urinary tract infection.  He is followed in home by advanced home care.  He is married and they have no children.  He shares with me his wife has a seizure disorder and is unable to drive.  She does verify this as well.  She seems educated as to  issues that he is dealing with in terms of residual effects from the stroke 2 years ago, such as urinary retention and dysphasia  Patient spouse, David Duke, would be his healthcare proxy in the event he were unable to make decisions for himself.  At this point he is dealing with short-term memory deficits and would need assistance to participate in goals of care discussions    SUMMARY OF RECOMMENDATIONS   Continue full code, full scope of treatment for  now Plan to meet with wife for goals of care meeting on 01/17/2018 at 1 PM Code Status/Advance Care Planning:  Full code    Symptom Management:   Anxiety: Continue with Valium 5 mg every 6 hours on a scheduled basis  Pain: Continue with Vicodin 1-2 tabs every 4 hours as needed  Palliative Prophylaxis:   Aspiration, Bowel Regimen, Delirium Protocol, Eye Care, Frequent Pain Assessment, Oral Care and Turn Reposition  Additional Recommendations (Limitations, Scope, Preferences):  Full Scope Treatment  Psycho-social/Spiritual:   Desire for further Chaplaincy support:no  Additional Recommendations: Referral to Community Resources   Prognosis:   Unable to determine  Discharge Planning: To Be Determined      Primary Diagnoses: Present on Admission: . Rhinitis, chronic . CKD (chronic kidney disease) stage 3, GFR 30-59 ml/min (HCC) . Acute encephalopathy . Depression . Anxiety . COPD (chronic obstructive pulmonary disease) ([place]) . Aphasia . Macular degeneration . Prediabetes . Absolute anemia . BPH (benign prostatic hyperplasia) . Urinary retention . Acute kidney injury superimposed on chronic kidney disease ([place]) . Chronic tension-type headache, not intractable . Malnutrition of moderate degree . Sepsis ([place]) . Sepsis due to urinary tract  infection ([place])   I have reviewed the medical record, interviewed the patient and family, and examined the patient. The following aspects are pertinent.  Past Medical History:  Diagnosis Date  . AAA (abdominal aortic aneurysm) ([place])   . AKI (acute kidney injury) ([place])   . Anxiety   . Blind right eye   . BPH (benign prostatic hypertrophy) with urinary retention   . COPD (chronic obstructive pulmonary disease) ([place])   . Dementia   . Depression   . High cholesterol   . Macular degeneration of both eyes   . Renal disorder   . Shortness of breath dyspnea   . Stroke [facility]) 11/2015   has residual slurred speech   Social  History   Socioeconomic History  . Marital status: Married    Spouse name: Not on file  . Number of children: Not on file  . Years of education: Not on file  . Highest education level: Not on file  Occupational History  . Not on file  Social Needs  . Financial resource strain: Not hard at all  . Food insecurity:    Worry: Never true    Inability: Never true  . Transportation needs:    Medical: No    Non-medical: No  Tobacco Use  . Smoking status: Former Smoker    Packs/day: 0.25    Years: 60.00    Pack years: 15.00    Types: Cigarettes    Last attempt to quit: 11/22/2015    Years since quitting: 2.1  . Smokeless tobacco: Never Used  Substance and Sexual Activity  . Alcohol use: No  . Drug use: No  . Sexual activity: Never  Lifestyle  . Physical activity:    Days per week: 0 days    Minutes per session: Not on file  . Stress: Only a little  Relationships  . Social connections:    Talks on phone: Twice a week    Gets together: Never    Attends religious service: Never    Active member of club or organization: No    Attends meetings of clubs or organizations: Never    Relationship status: Married  Other Topics Concern  . Not on file  Social History Narrative  . Not on file   Family History  Problem Relation Age of Onset  . Colon cancer Mother   . Cerebral aneurysm Father    Scheduled Meds: . diazepam  5 mg Oral Q6H  . finasteride  5 mg Oral Daily  . guaiFENesin  1,200 mg Oral BID  . heparin  5,000 Units Subcutaneous Q8H  . multivitamin with minerals  1 tablet Oral Daily  . nystatin  5 mL Mouth/Throat QID  . pantoprazole  40 mg Oral Daily  . predniSONE  10 mg Oral Q breakfast   Continuous Infusions: . sodium chloride 100 mL/hr at 01/16/18 0749  . aztreonam Stopped (01/16/18 1136)  . levofloxacin (LEVAQUIN) IV 750 mg (01/16/18 0514)   PRN Meds:.acetaminophen **OR** acetaminophen, albuterol, fluticasone, HYDROcodone-acetaminophen, ondansetron **OR**  ondansetron (ZOFRAN) IV, polyethylene glycol, polyvinyl alcohol, senna-docusate Medications Prior to Admission:  Prior to Admission medications   Medication Sig Start Date End Date Taking? Authorizing Provider  acetaminophen (TYLENOL) 500 MG tablet Take 500 mg by mouth every 6 (six) hours as needed for mild pain, moderate pain or headache.   Yes [provider]  albuterol (PROVENTIL HFA;VENTOLIN HFA) 108 (90 Base) MCG/ACT inhaler Inhale 1-2 puffs into the lungs every 6 (six) hours as needed for  wheezing or shortness of breath. 12/28/16  Yes Long, Wonda Olds, MD  albuterol (PROVENTIL) (2.5 MG/3ML) 0.083% nebulizer solution Take 2.5 mg by nebulization every 8 (eight) hours as needed for wheezing or shortness of breath.   Yes [provider]  busPIRone (BUSPAR) 5 MG tablet Take 5 mg by mouth 2 (two) times daily.   Yes [provider]  diazepam (VALIUM) 5 MG tablet Take 1 tablet (5 mg total) by mouth every 6 (six) hours. 12/25/17  Yes Patrecia Pour, MD  finasteride (PROSCAR) 5 MG tablet Take 5 mg by mouth daily. 09/22/16  Yes [provider]  lansoprazole (PREVACID) 15 MG capsule Take 15 mg by mouth daily after breakfast.    Yes [provider]  nystatin (MYCOSTATIN/NYSTOP) powder Apply 1 g topically as needed (rash).    Yes [provider]  ondansetron (ZOFRAN) 4 MG tablet Take 4 mg by mouth every 6 (six) hours as needed for nausea or vomiting.    Yes [provider]  OXYGEN Inhale 2 L into the lungs continuous.   Yes [provider]  fluticasone (FLONASE) 50 MCG/ACT nasal spray Place 1 spray into both nostrils daily. Patient not taking: Reported on 01/15/2018 12/11/15   Love, Ivan Anchors, PA-C  guaiFENesin (MUCINEX) 600 MG 12 hr tablet Take 2 tablets (1,200 mg total) by mouth 2 (two) times daily. Patient not taking: Reported on 01/15/2018 09/11/17   Rosita Fire, MD  ipratropium-albuterol (DUONEB) 0.5-2.5 (3) MG/3ML SOLN Take 3 mLs by  nebulization every 6 (six) hours as needed. Patient not taking: Reported on 01/15/2018 12/03/17   Hosie Poisson, MD  Multiple Vitamin (MULTIVITAMIN WITH MINERALS) TABS tablet Take 1 tablet by mouth daily.    [provider]  nicotine (NICODERM CQ - DOSED IN MG/24 HOURS) 14 mg/24hr patch Place 1 patch (14 mg total) onto the skin daily. Patient not taking: Reported on 01/15/2018 12/04/17   Hosie Poisson, MD  Polyethyl Glycol-Propyl Glycol (SYSTANE) 0.4-0.3 % SOLN Apply 1 drop to eye as needed.    [provider]  polyethylene glycol (MIRALAX / GLYCOLAX) packet Take 17 g by mouth daily. Patient not taking: Reported on 01/15/2018 09/22/17   Patrecia Pour, MD  predniSONE (DELTASONE) 20 MG tablet Take 2 tablets (40 mg total) by mouth daily with breakfast. Patient not taking: Reported on 01/15/2018 12/26/17   Patrecia Pour, MD   Allergies  Allergen Reactions  . Penicillins Hives, Diarrhea and Nausea And Vomiting    Has patient had a PCN reaction causing immediate rash, facial/tongue/throat swelling, SOB or lightheadedness with hypotension: Yes Has patient had a PCN reaction causing severe rash involving mucus membranes or skin necrosis: No Has patient had a PCN reaction that required hospitalization: No Has patient had a PCN reaction occurring within the last 10 years: No If all of the above answers are "NO", then may proceed with Cephalosporin use.   [name] Amoxicillin Hives, Diarrhea and Nausea And Vomiting  . Sulfa Antibiotics Nausea Only   Review of Systems  Unable to perform ROS: Dementia    Physical Exam  Constitutional:  Frail, elderly man; sitting in a recliner, no acute distress  HENT:  Head: Normocephalic and atraumatic.  Eyes:  Patient is blind in right eye secondary to macular degeneration  Neck: Normal range of motion.  Cardiovascular: Normal rate.  Pulmonary/Chest: Effort normal.  Genitourinary:  Genitourinary Comments: Chronic indwelling Foley catheter    Musculoskeletal: Normal range of motion.  Neurological: He is alert.  Oriented to person place.  Short-term memory deficits noted  Skin: Skin is warm and dry.  Psychiatric:  Anxious; "I just need to know what I need to do next"  Nursing note and vitals reviewed.   Vital Signs: BP (!) 141/63 (BP Location: Left Arm)   Pulse 87   Temp 97.8 F (36.6 C) (Oral)   Resp 20   Ht 5' 8"  (1.727 m)   Wt 62.1 kg (137 lb)   SpO2 99%   BMI 20.83 kg/m  Pain Scale: 0-10   Pain Score: 0-No pain   SpO2: SpO2: 99 % O2 Device:SpO2: 99 % O2 Flow Rate: .O2 Flow Rate (L/min): 2 L/min  IO: Intake/output summary:   Intake/Output Summary (Last 24 hours) at 01/16/2018 1432 Last data filed at 01/16/2018 3335 Gross per 24 hour  Intake 3713.33 ml  Output 1650 ml  Net 2063.33 ml    LBM: Last BM Date: 01/14/18 Baseline Weight: Weight: 62.1 kg (137 lb) Most recent weight: Weight: 62.1 kg (137 lb)     Palliative Assessment/Data:   Flowsheet Rows     Most Recent Value  Intake Tab  Referral Department  Hospitalist  Unit at Time of Referral  Med/Surg Unit  Palliative Care Primary Diagnosis  Sepsis/Infectious Disease  Date Notified  01/16/18  Palliative Care Type  New Palliative care  Reason for referral  Clarify Goals of Care  Date of Admission  01/15/18  Date first seen by Palliative Care  01/16/18  # of days Palliative referral response time  0 Day(s)  # of days IP prior to Palliative referral  1  Clinical Assessment  Palliative Performance Scale Score  40%  Pain Max last 24 hours  Not able to report  Pain Min Last 24 hours  Not able to report  Dyspnea Max Last 24 Hours  Not able to report  Dyspnea Min Last 24 hours  Not able to report  Nausea Max Last 24 Hours  Not able to report  Nausea Min Last 24 Hours  Not able to report  Anxiety Max Last 24 Hours  Not able to report  Anxiety Min Last 24 Hours  Not able to report  Other Max Last 24 Hours  Not able to report  Psychosocial &  Spiritual Assessment  Palliative Care Outcomes  Patient/Family meeting held?  Yes  Who was at the meeting?  pt,  mtg with wife 5/12 1300  Palliative Care follow-up planned  Yes, Facility      Time In: 1330 Time Out: 1440 Time Total: 70 min Greater than 50%  of this time was spent counseling and coordinating care related to the above assessment and plan.  Signed by: Dory Horn, NP   Please contact Palliative Medicine Team phone at [phone] for questions and concerns.  For individual provider: See Shea Evans

## 2018-01-17 LAB — URINE CULTURE: Culture: >=100000 — AB

## 2018-01-17 MED ORDER — HALOPERIDOL 2 MG PO TABS
2.0000 mg | ORAL_TABLET | ORAL | Status: AC
Start: 2018-01-17 — End: 2018-01-17
  Administered 2018-01-17: 2 mg via ORAL
  Filled 2018-01-17: qty 1

## 2018-01-17 MED ORDER — VANCOMYCIN HCL IN DEXTROSE 1-5 GM/200ML-% IV SOLN
1000.0000 mg | INTRAVENOUS | Status: DC
  Administered 2018-01-17: 1000 mg via INTRAVENOUS
  Filled 2018-01-17: qty 200

## 2018-01-17 MED ORDER — BUSPIRONE HCL 5 MG PO TABS
5.0000 mg | ORAL_TABLET | Freq: Two times a day (BID) | ORAL | Status: DC
  Administered 2018-01-17 – 2018-01-18 (×2): 5 mg via ORAL
  Filled 2018-01-17 (×4): qty 1

## 2018-01-17 MED ORDER — LEVOFLOXACIN 750 MG PO TABS
750.0000 mg | ORAL_TABLET | ORAL | Status: DC
  Administered 2018-01-18: 750 mg via ORAL
  Filled 2018-01-17: qty 1

## 2018-01-17 NOTE — Progress Notes (Signed)
Patient and family states that patient is O2 dependent at home at 2L.

## 2018-01-17 NOTE — Progress Notes (Addendum)
Addendum: Pharmacy consulted to dose oral Levaquin for UTI. Patient has allergy to penicillins and sulfa drugs.   Plan: Levaquin 750 mg PO daily  Recommend 3 to 5 more days of therapy.   Pharmacy Antibiotic Note  David Duke is a 83 y.o. male admitted on 01/15/2018 with sepsis.  Pharmacy has been consulted for Vancomycin/Aztreonam/Levaquin dosing. WBC mildly elevated. Noted renal dysfunction.  Vancomycin discontinued by [name] for unknown reason. Patient missed dose on 5/11. Restarting vancomycin now.  Plan: Vancomycin 1000 mg IV q24h Aztreonam 1g IV q8h Levaquin 500 mg IV q24h Trend WBC, temp, renal function  F/U infectious work-up Drug levels as indicated   Height:  (172.7 cm) Weight: 136 lb 14.5 oz (62.1 kg) IBW/kg (Calculated) : 68.4  Temp (24hrs), Avg:98.2 F (36.8 C), Min:97.8 F (36.6 C), Max:98.6 F (37 C)  Recent Labs  Lab 01/15/18 0535 01/15/18 0545 01/15/18 0749 01/16/18 0321  WBC  --  11.1*  --  7.7  CREATININE  --  1.56*  --  1.23  LATICACIDVEN 2.28*  --  1.17  --     Estimated Creatinine Clearance: 41.4 mL/min (by C-G formula based on SCr of 1.23 mg/dL).    Allergies  Allergen Reactions  . Penicillins Hives, Diarrhea and Nausea And Vomiting    Has patient had a PCN reaction causing immediate rash, facial/tongue/throat swelling, SOB or lightheadedness with hypotension: Yes Has patient had a PCN reaction causing severe rash involving mucus membranes or skin necrosis: No Has patient had a PCN reaction that required hospitalization: No Has patient had a PCN reaction occurring within the last 10 years: No If all of the above answers are "NO", then may proceed with Cephalosporin use.   [name] Amoxicillin Hives, Diarrhea and Nausea And Vomiting  . Sulfa Antibiotics Nausea Only    [name] 01/17/2018 9:40 AM

## 2018-01-17 NOTE — Progress Notes (Signed)
Daily Progress Note   Patient Name: David Duke       Date: 01/17/2018 DOB: 07/12/1935  Age: 83 y.o. MRN#: [id number] Attending Physician: Lavina Hamman, MD Primary Care Physician: Clovia Cuff, MD Admit Date: 01/15/2018  Reason for Consultation/Follow-up: Establishing goals of care and Psychosocial/spiritual support  Subjective: Patient seen, chart reviewed.  Met with patient's wife for introduction to palliative medicine services as well as goals of care discussion.  Reviewed at length with spouse pathophysiology behind recurrent urinary tract infections (chronic indwelling Foley catheter because of urinary retention status post stroke 2 years ago); disease pathophysiology related to dementia, as well as what to expect going forward.  Hard choices for loving People booklet as well as a MOST form given to spouse.  Wife is extremely overwhelmed as the primary caregiver, limited support except through home health agency that she currently has engaged.  She describes this relationship is very beneficial but she is verbalizing a lot of guilt that she is "not doing good enough" and keeping him out of the hospital.  I informed her that unfortunately this is difficult situation and that he is going to continue to get urinary tract infections because he now has a neurogenic bladder and requires a chronic indwelling Foley catheter.  I attempted to reassure her that she is doing everything she can.  Additionally, patient is becoming more verbally abusive, agitated and was paranoid today accusing [place] of doing things behind his back.  Introduced concepts of hospice as a form of support in the home as well as PACE program  Length of Stay: 2  Current Medications: Scheduled Meds:  . diazepam  5 mg Oral  Q6H  . finasteride  5 mg Oral Daily  . guaiFENesin  1,200 mg Oral BID  . heparin  5,000 Units Subcutaneous Q8H  . multivitamin with minerals  1 tablet Oral Daily  . nystatin  5 mL Mouth/Throat QID  . pantoprazole  40 mg Oral Daily  . predniSONE  10 mg Oral Q breakfast    Continuous Infusions: . sodium chloride 100 mL/hr at 01/17/18 0531  . aztreonam Stopped (01/17/18 0601)  . levofloxacin (LEVAQUIN) IV Stopped (01/17/18 0734)  . vancomycin      PRN Meds: acetaminophen **OR** acetaminophen, albuterol, fluticasone, HYDROcodone-acetaminophen, ondansetron **OR** ondansetron (ZOFRAN) IV, polyethylene glycol, polyvinyl  alcohol, senna-docusate  Physical Exam  Constitutional: He appears well-developed and well-nourished.  Eyes:  Blind in right eye; decreased vision on the left secondary to macular degeneration  Neck: Normal range of motion.  Cardiovascular: Normal rate.  Pulmonary/Chest: Effort normal.  Musculoskeletal: Normal range of motion.  Neurological: He is alert.  Short-term memory deficits noted  Skin: Skin is warm and dry.  Psychiatric:  Alert and oriented to person place Short-term memory deficits noted (this is patient's baseline) Patient more suspicious, paranoid today  Nursing note and vitals reviewed.           Vital Signs: BP 117/65 (BP Location: Right Arm)   Pulse 85   Temp 98.1 F (36.7 C) (Oral)   Resp 18   Ht 5' 8"  (1.727 m)   Wt 62.1 kg (136 lb 14.5 oz)   SpO2 94%   BMI 20.82 kg/m  SpO2: SpO2: 94 % O2 Device: O2 Device: Nasal Cannula O2 Flow Rate: O2 Flow Rate (L/min): 2 L/min  Intake/output summary:   Intake/Output Summary (Last 24 hours) at 01/17/2018 1037 Last data filed at 01/17/2018 0615 Gross per 24 hour  Intake 1560 ml  Output 3850 ml  Net -2290 ml   LBM: Last BM Date: 01/16/18 Baseline Weight: Weight: 62.1 kg (137 lb) Most recent weight: Weight: 62.1 kg (136 lb 14.5 oz)       Palliative Assessment/Data:    Flowsheet Rows      Most Recent Value  Intake Tab  Referral Department  Hospitalist  Unit at Time of Referral  Med/Surg Unit  Palliative Care Primary Diagnosis  Sepsis/Infectious Disease  Date Notified  01/16/18  Palliative Care Type  New Palliative care  Reason for referral  Clarify Goals of Care  Date of Admission  01/15/18  Date first seen by Palliative Care  01/16/18  # of days Palliative referral response time  0 Day(s)  # of days IP prior to Palliative referral  1  Clinical Assessment  Palliative Performance Scale Score  40%  Pain Max last 24 hours  Not able to report  Pain Min Last 24 hours  Not able to report  Dyspnea Max Last 24 Hours  Not able to report  Dyspnea Min Last 24 hours  Not able to report  Nausea Max Last 24 Hours  Not able to report  Nausea Min Last 24 Hours  Not able to report  Anxiety Max Last 24 Hours  Not able to report  Anxiety Min Last 24 Hours  Not able to report  Other Max Last 24 Hours  Not able to report  Psychosocial & Spiritual Assessment  Palliative Care Outcomes  Patient/Family meeting held?  Yes  Who was at the meeting?  pt,  mtg with wife 5/12 1300  Palliative Care follow-up planned  Yes, Facility      Patient Active Problem List   Diagnosis Date Noted  . Goals of care, counseling/discussion   . Palliative care by specialist   . Sepsis due to urinary tract infection ([place]) 01/15/2018  . CKD (chronic kidney disease) stage 3, GFR 30-59 ml/min (H[facility]) 12/22/2017  . Catheter-associated urinary tract infection ([place]) 12/22/2017  . COPD with acute exacerbation ([place]) 11/30/2017  . UTI (urinary tract infection) 11/23/2017  . HCAP (healthcare-associated pneumonia) 09/18/2017  . Sepsis ([place])   . Acute respiratory failure with hypoxia ([place]) 09/07/2017  . Rhinitis, chronic 09/07/2017  . Malnutrition of moderate degree 09/07/2017  . Elevated troponin 03/27/2017  . Diplopia 03/27/2017  . Bilateral  leg edema 03/27/2017  . Enterocolitis 09/30/2016  . HLD  (hyperlipidemia) 09/30/2016  . Tobacco abuse 09/30/2016  . Nausea vomiting and diarrhea 09/30/2016  . History of stroke in prior 3 months 01/22/2016  . Chronic tension-type headache, not intractable   . Acute frontal sinusitis   . 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 Gait disturbance, post-stroke   . Ataxia, late effect of cerebrovascular disease   . BPH (benign prostatic hyperplasia)   . Urinary retention   . Adjustment disorder with mixed anxiety and depressed mood   . Chronic obstructive pulmonary disease ([place])   . Cephalalgia   . Acute kidney injury superimposed on chronic kidney disease ([place])   . Macular degeneration   . Prediabetes   . Hypokalemia   . Absolute anemia   . Aphasia   . Acute encephalopathy 11/25/2015  . Hyponatremia 11/25/2015  . Acute kidney injury ([place]) 11/25/2015  . Depression   . Anxiety   . COPD (chronic obstructive pulmonary disease) ([place])   . ICH (intracerebral hemorrhage) ([place]) 11/22/2015    Palliative Care Assessment & Plan   Patient Profile: 83 y.o. male  with past medical history of COPD, chronic kidney disease stage III, GERD, BPH, history of hemorrhagic CVA 2017, neurogenic bladder (chronic indwelling Foley catheter secondary to severe urinary retention), macular degeneration AAA admitted on 01/15/2018 with confusion and agitation.  Patient was found to have another urinary tract infection and was admitted for sepsis.  Patient has had frequent admissions secondary to recurrent urinary tract infections.  When he was hospitalized in April cultures were positive for Pseudomonas and enterococcus.  Consult ordered for goals of care.     Recommendations/Plan:  Recommend DNR/DNI.  Wife states that this is her husband's wishe as reflected in his advanced directives.  Changed CODE STATUS to DNR and hardcopy of DNR placed on the chart for transport home  Began discussions regarding hospice support in the home, as well as  PACE program.  Wife recognizes that recurrent hospitalizations ultimately are not going to help and are actually making patient's mental status worse.  Recommended that she speak to patient's urologist, Dr. Felipa Eth, about antibiotic suppression to avoid further inpatient hospitalizations  Agitation: Patient is currently on Valium 5 mg 4 times a day but has likely acclimated to that.  Restarted BuSpar at 5 mg twice daily which he was on prior to admission.  This dose can certainly be uptitrated for further effect.  Patient also required Haldol 2 mg orally x1 due to his agitation that worsened over the afternoon  Goals of Care and Additional Recommendations:  Limitations on Scope of Treatment: Full Scope Treatment except for DNR/DNI  Code Status:    Code Status Orders  (From admission, onward)        Start     Ordered   01/15/18 0801  Full code  Continuous     01/15/18 0803    Code Status History    Date Active Date Inactive Code Status Order ID Comments User Context   12/22/2017 1958 12/25/2017 2124 Full Code [id number]  Etta Quill, DO ED   11/30/2017 1156 12/03/2017 1524 Full Code [id number]  Patrecia Pour, MD ED   11/23/2017 1803 11/25/2017 1649 Full Code [id number]  Hosie Poisson, MD Inpatient   09/18/2017 1653 09/22/2017 1952 Full Code [id number]  Phillips Grout, MD Inpatient   09/07/2017 0839 09/11/2017 1920 Full Code [id number]  Barton Dubois, MD Inpatient   03/27/2017 [id number] 03/28/2017  2048 Full Code [id number]  Shela Leff, MD Inpatient   09/30/2016 0505 10/01/2016 1736 Full Code [id number]  Ivor Costa, MD ED   06/10/2016 0107 06/10/2016 1857 Full Code [id number]  Varney Biles, MD ED   11/28/2015 1630 12/11/2015 1540 Full Code [id number]  Flora Lipps Inpatient   11/28/2015 1558 11/28/2015 1630 Full Code [id number]  Flora Lipps Inpatient   11/22/2015 1811 11/28/2015 1558 Full Code [id number]  Maisie Fus, MD ED    Advance Directive Documentation     Most  Recent Value  Type of Advance Directive  Healthcare Power of Attorney, Living will  Pre-existing out of facility DNR order (yellow form or pink MOST form)  -  "MOST" Form in Place?  -       Prognosis:   Unable to determine  Discharge Planning:  Home with [place] was discussed with Dr. Posey Pronto  Thank you for allowing the Palliative Medicine Team to assist in the care of this patient.   Time In: 1300 Time Out: 1410 Total Time 70 min Prolonged Time Billed  yes       Greater than 50%  of this time was spent counseling and coordinating care related to the above assessment and plan.  Dory Horn, NP  Please contact Palliative Medicine Team phone at [phone] for questions and concerns.

## 2018-01-17 NOTE — Progress Notes (Signed)
[facility] Progress Note  Patient: DEMONE LYLES XLK:[id number]   PCP: Clovia Cuff, MD DOB: 08/12/1935   DOA: 01/15/2018   DOS: 01/17/2018   Date of Service: the patient was seen and examined on 01/17/2018  Subjective: Continues to feel better.  No nausea no vomiting no fever no chills.  Brief hospital course: Pt. with PMH of  COPD with recent admission for exacerbation, tobacco use, stage III CKD, anxiety, GERD, history of BPH, urinary retention with chronic indwelling Foley catheter last changed by urologist 2 days ago; admitted on 01/15/2018, presented with complaint of confusion, was found to have possible UTI, possible pneumonia. Currently further plan is continue antibiotics.  Assessment and Plan: 1.  Enterococcus UTI. Community-acquired pneumonia. Chronic indwelling Foley catheter. Foley catheter already changed 2 days ago. Met Sirs criteria on admission. Possibly more likely dehydration. Initially started on IV vancomycin and Levaquin. Urine culture shows enterococcus.  Suspect colonizer. Chest x-ray shows evidence of possible aspiration pneumonitis. Versus pneumonia continue with Levaquin. Blood cultures no growth to date. Completed Levaquin treatment with total of 5 days course. Family is requesting suppressive long-term antibiotic therapy. We will defer to patient's primary urologist.  2.  COPD without exacerbation. Patient was on a tapering dose of prednisone, will continue 10 mg daily for 3 days to finish the course per family. Continue nebulizers. Continue oxygen.  3.  Active smoker. Recommended to stop smoking  4.  Chronic kidney disease stage III. Mild acute kidney injury. Renal function getting better with hydration. Monitor.  Diet: Cardiac diet DVT Prophylaxis: subcutaneous Heparin  Advance goals of care discussion: full code  Family Communication: family was present at bedside, at the time of interview.   Disposition:  Discharge to home  with home health likely tomorrow.  Consultants: none Procedures: none  Antibiotics: Anti-infectives (From admission, onward)   Start     Dose/Rate Route Frequency Ordered Stop   01/18/18 0530  levofloxacin (LEVAQUIN) tablet 750 mg     750 mg Oral Every 24 hours 01/17/18 1349     01/17/18 0945  vancomycin (VANCOCIN) IVPB 1000 mg/200 mL premix  Status:  Discontinued     1,000 mg 200 mL/hr over 60 Minutes Intravenous Every 24 hours 01/17/18 0940 01/17/18 1256   01/16/18 1200  aztreonam (AZACTAM) 1 g in sodium chloride 0.9 % 100 mL IVPB  Status:  Discontinued     1 g 200 mL/hr over 30 Minutes Intravenous Every 8 hours 01/16/18 0504 01/17/18 1256   01/16/18 0600  levofloxacin (LEVAQUIN) IVPB 500 mg  Status:  Discontinued     500 mg 100 mL/hr over 60 Minutes Intravenous Every 24 hours 01/15/18 0707 01/15/18 0803   01/16/18 0600  levofloxacin (LEVAQUIN) IVPB 750 mg  Status:  Discontinued     750 mg 100 mL/hr over 90 Minutes Intravenous Every 24 hours 01/15/18 1701 01/17/18 1256   01/16/18 0500  vancomycin (VANCOCIN) IVPB 1000 mg/200 mL premix  Status:  Discontinued     1,000 mg 200 mL/hr over 60 Minutes Intravenous Every 24 hours 01/15/18 0707 01/15/18 0803   01/15/18 1930  aztreonam (AZACTAM) 1 g in sodium chloride 0.9 % 100 mL IVPB  Status:  Discontinued     1 g 200 mL/hr over 30 Minutes Intravenous Every 8 hours 01/15/18 1848 01/16/18 0504   01/15/18 1715  aztreonam (AZACTAM) injection 2 g  Status:  Discontinued     2 g Intramuscular Every 8 hours 01/15/18 1701 01/15/18 1841   01/15/18 1400  aztreonam (  AZACTAM) 1 g in sodium chloride 0.9 % 100 mL IVPB  Status:  Discontinued     1 g 200 mL/hr over 30 Minutes Intravenous Every 8 hours 01/15/18 0707 01/15/18 0803   01/15/18 0545  levofloxacin (LEVAQUIN) IVPB 750 mg     750 mg 100 mL/hr over 90 Minutes Intravenous  Once 01/15/18 0537 01/15/18 0807   01/15/18 0545  aztreonam (AZACTAM) 2 g in sodium chloride 0.9 % 100 mL IVPB     2 g 200  mL/hr over 30 Minutes Intravenous  Once 01/15/18 0537 01/15/18 0807   01/15/18 0545  vancomycin (VANCOCIN) IVPB 1000 mg/200 mL premix     1,000 mg 200 mL/hr over 60 Minutes Intravenous  Once 01/15/18 0537 01/15/18 0656       Objective: Physical Exam: Vitals:   01/16/18 2057 01/17/18 0444 01/17/18 0500 01/17/18 0944  BP: 115/65 (!) 155/98  117/65  Pulse: 82 79  85  Resp: _0 Temp: 98.6 F (37 C) 98.3 F (36.8 C)  98.1 F (36.7 C)  TempSrc: Oral Oral  Oral  SpO2: 98% 98%  94%  Weight:   62.1 kg (136 lb 14.5 oz)   Height:        Intake/Output Summary (Last 24 hours) at 01/17/2018 1444 Last data filed at 01/17/2018 0615 Gross per 24 hour  Intake 1080 ml  Output 3600 ml  Net -2520 ml   Filed Weights   01/15/18 0506 01/17/18 0500  Weight: 62.1 kg (137 lb) 62.1 kg (136 lb 14.5 oz)   General: Alert, Awake and Oriented to Time, Place and Person. Appear in mild distress, affect appropriate Eyes: PERRL, Conjunctiva normal ENT: Oral Mucosa clear moist. Neck: no JVD, no Abnormal Mass Or lumps Cardiovascular: S1 and S2 Present, no Murmur, Peripheral Pulses Present Respiratory: normal respiratory effort, Bilateral Air entry equal and Decreased, no use of accessory muscle, Clear to Auscultation, no Crackles, no wheezes Abdomen: Bowel Sound present, Soft and no tenderness, no hernia Skin: no redness, no Rash, no induration Extremities: no Pedal edema, no calf tenderness Neurologic: Grossly no focal neuro deficit. Bilaterally Equal motor strength  Data Reviewed: CBC: Recent Labs  Lab 01/15/18 0545 01/16/18 0321  WBC 11.1* 7.7  NEUTROABS 8.8*  --   HGB 13.8 11.9*  HCT 42.4 37.5*  MCV 92.0 92.6  PLT 217 474   Basic Metabolic Panel: Recent Labs  Lab 01/15/18 0545 01/16/18 0321  NA 139 141  K 3.6 3.8  CL 101 111  CO2 26 24  GLUCOSE 96 92  BUN 13 15  CREATININE 1.56* 1.23  CALCIUM 8.8* 7.9*    Liver Function Tests: Recent Labs  Lab 01/15/18 0545  AST 29    ALT 38  ALKPHOS 69  BILITOT 0.6  PROT 6.3*  ALBUMIN 3.2*   No results for input(s): LIPASE, AMYLASE in the last 168 hours. No results for input(s): AMMONIA in the last 168 hours. Coagulation Profile: Recent Labs  Lab 01/15/18 0545  INR 0.90   Cardiac Enzymes: No results for input(s): CKTOTAL, CKMB, CKMBINDEX, TROPONINI in the last 168 hours. BNP (last 3 results) No results for input(s): PROBNP in the last 8760 hours. CBG: No results for input(s): GLUCAP in the last 168 hours. Studies: No results found.  Scheduled Meds: . busPIRone  5 mg Oral BID  . diazepam  5 mg Oral Q6H  . finasteride  5 mg Oral Daily  . guaiFENesin  1,200 mg Oral BID  . heparin  5,000 Units Subcutaneous Q8H  . [START ON 01/18/2018] levofloxacin  750 mg Oral Q24H  . multivitamin with minerals  1 tablet Oral Daily  . nystatin  5 mL Mouth/Throat QID  . pantoprazole  40 mg Oral Daily  . predniSONE  10 mg Oral Q breakfast   Continuous Infusions:  PRN Meds: acetaminophen **OR** acetaminophen, albuterol, fluticasone, HYDROcodone-acetaminophen, ondansetron **OR** ondansetron (ZOFRAN) IV, polyethylene glycol, polyvinyl alcohol, senna-docusate  Time spent: 35 minutes  Author: Berle Mull, MD [facility] Pager: [phone] 01/17/2018 2:44 PM  If 7PM-7AM, please contact night-coverage at [email or web address], password [email or web address]

## 2018-01-17 NOTE — Evaluation (Signed)
Occupational Therapy Evaluation Patient Details Name: David Duke MRN: [id number] DOB: Feb 21, 1935 Today's Date: 01/17/2018    History of Present Illness Pt. with PMH of  COPD with recent admission for exacerbation, tobacco use, stage III CKD, anxiety, GERD, history of BPH, urinary retention with chronic indwelling Foley catheter last changed by urologist 2 days ago; admitted on 01/15/2018, presented with complaint of confusion, was found to have possible UTI, possible pneumonia.   Clinical Impression   PTA, pt was living with his wife and was performing BADLs per pt. Pt's wife not present to confirm home set up and PLOF. Pt currently requiring Min Guard A for ADLs and functional mobility. Pt near baseline function and presenting with decreased balance and activity tolerance as seen by SOB and decreased SPO2 during activity. Pt would benefit from further acute OT to facilitate safe dc. Recommend dc to home with HHOT for further OT to optimize safety, independence with ADLs, and return to PLOF.      Follow Up Recommendations  Home health OT;Supervision/Assistance - 24 hour    Equipment Recommendations  None recommended by OT    Recommendations for Other Services       Precautions / Restrictions Precautions Precautions: Fall Restrictions Weight Bearing Restrictions: No      Mobility Bed Mobility Overal bed mobility: Needs Assistance Bed Mobility: Supine to Sit     Supine to sit: Supervision     General bed mobility comments: supervision for safety, increased time and effort  Transfers Overall transfer level: Needs assistance Equipment used: None Transfers: Sit to/from Stand Sit to Stand: Min guard         General transfer comment: Min Guard A for safety    Balance Overall balance assessment: Needs assistance Sitting-balance support: No upper extremity supported Sitting balance-Leahy Scale: Good     Standing balance support: During functional  activity;Bilateral upper extremity supported Standing balance-Leahy Scale: Fair                             ADL either performed or assessed with clinical judgement   ADL Overall ADL's : Needs assistance/impaired Eating/Feeding: Independent   Grooming: Min guard;Standing;Wash/dry hands;Wash/dry face Grooming Details (indicate cue type and reason): Pt washing his face at the sink with Min Guard A for safety Upper Body Bathing: Set up;Supervision/ safety;Sitting   Lower Body Bathing: Min guard;Sit to/from stand   Upper Body Dressing : Set up;Sitting   Lower Body Dressing: Min guard;Sit to/from stand   Toilet Transfer: Min guard;Ambulation(Simulated to recliner) Toilet Transfer Details (indicate cue type and reason): Pt with catheter at baseline. Min Guard for safety         Functional mobility during ADLs: Min guard;Cueing for safety General ADL Comments: Pt performing near baseline functional with Mim Guard A for safety.      Vision Baseline Vision/History: Legally blind(R eye)       Perception     Praxis      Pertinent Vitals/Pain Pain Assessment: No/denies pain     Hand Dominance Right   Extremity/Trunk Assessment Upper Extremity Assessment Upper Extremity Assessment: Overall WFL for tasks assessed   Lower Extremity Assessment Lower Extremity Assessment: Overall WFL for tasks assessed   Cervical / Trunk Assessment Cervical / Trunk Assessment: Kyphotic   Communication Communication Communication: HOH   Cognition Arousal/Alertness: Awake/alert Behavior During Therapy: WFL for tasks assessed/performed Overall Cognitive Status: No family/caregiver present to determine baseline cognitive functioning Area of Impairment:  Attention;Following commands;Safety/judgement;Problem solving                   Current Attention Level: Sustained Memory: Decreased short-term memory Following Commands: Follows multi-step commands inconsistently;Follows  one step commands with increased time Safety/Judgement: Decreased awareness of deficits;Decreased awareness of safety Awareness: Emergent Problem Solving: Difficulty sequencing;Requires verbal cues General Comments: h/o dementia, wife not present this session   General Comments  SpO2 staying in low 90s on 2L O2    Exercises     Shoulder Instructions      Home Living Family/patient expects to be discharged to:: Private residence Living Arrangements: Spouse/significant other Available Help at Discharge: Family;Available 24 hours/day Type of Home: House Home Access: Stairs to enter [employer] of Steps: 4 Entrance Stairs-Rails: Right;Left;Can reach both Home Layout: One level     Bathroom Shower/Tub: Producer, television/film/video: Standard Bathroom Accessibility: Yes How Accessible: Accessible via walker Home Equipment: Emergency planning/management officer - 2 wheels   Additional Comments: Wife provides 24/7 supervision      Prior Functioning/Environment Level of Independence: Needs assistance  Gait / Transfers Assistance Needed: Indep with ambulation; notes worsening "shaking" and weakness over past few days. Has had a couple falls at home recently ADL's / Homemaking Assistance Needed: Reports indep with ADLs; wife provides intermittent assist   Comments: retired Nurse, adult        OT Problem List: Decreased activity tolerance;Impaired balance (sitting and/or standing);Decreased cognition;Decreased safety awareness      OT Treatment/Interventions: Self-care/ADL training;DME and/or AE instruction;Therapeutic activities;Patient/family education;Balance training    OT Goals(Current goals can be found in the care plan section) Acute Rehab OT Goals Patient Stated Goal: Go home with wife OT Goal Formulation: With patient Time For Goal Achievement: 01/31/18 Potential to Achieve Goals: Good ADL Goals Pt Will Perform Grooming: standing;with supervision Pt Will Perform Upper  Body Dressing: sitting;with supervision Pt Will Perform Lower Body Dressing: with supervision;sit to/from stand Pt Will Transfer to Toilet: with supervision;regular height toilet;ambulating  OT Frequency: Min 2X/week   Barriers to D/C:            Co-evaluation              AM-PAC PT "6 Clicks" Daily Activity     Outcome Measure Help from another person eating meals?: None Help from another person taking care of personal grooming?: A Little Help from another person toileting, which includes using toliet, bedpan, or urinal?: A Little Help from another person bathing (including washing, rinsing, drying)?: A Little Help from another person to put on and taking off regular upper body clothing?: A Little Help from another person to put on and taking off regular lower body clothing?: A Little 6 Click Score: 19   End of Session Equipment Utilized During Treatment: Gait belt;Oxygen(2L) Nurse Communication: Mobility status  Activity Tolerance: Patient tolerated treatment well Patient left: with call bell/phone within reach;in chair;with chair alarm set  OT Visit Diagnosis: Unsteadiness on feet (R26.81);Other abnormalities of gait and mobility (R26.89);Muscle weakness (generalized) (M62.81)                Time: 0936-1000 OT Time Calculation (min): 24 min Charges:  OT General Charges $OT Visit: 1 Visit OT Evaluation $OT Eval Moderate Complexity: 1 Mod OT Treatments $Self Care/Home Management : 8-22 mins G-Codes:     Rickie Gutierres MSOT, OTR/L Acute Rehab Pager: [phone] Office: [phone]  Theodoro Grist David Duke 01/17/2018, 10:14 AM

## 2018-01-18 MED ORDER — HALOPERIDOL 2 MG PO TABS
2.0000 mg | ORAL_TABLET | Freq: Three times a day (TID) | ORAL | Status: DC | PRN
  Administered 2018-01-18: 2 mg via ORAL
  Filled 2018-01-18 (×2): qty 1

## 2018-01-18 MED ORDER — LEVOFLOXACIN 750 MG PO TABS: 750.0000 mg | ORAL_TABLET | Freq: Every day | ORAL | 0 refills | Status: AC

## 2018-01-18 MED ORDER — NYSTATIN 100000 UNIT/ML MT SUSP: 5.0000 mL | Freq: Four times a day (QID) | OROMUCOSAL | 0 refills | Status: AC

## 2018-01-18 MED ORDER — HALOPERIDOL 2 MG PO TABS: 2.0000 mg | ORAL_TABLET | Freq: Three times a day (TID) | ORAL | 0 refills | Status: AC | PRN

## 2018-01-18 NOTE — Care Management Note (Signed)
Case Management Note  Patient Details  Name: David Duke MRN: [id number] Date of Birth: 07-06-1935  Subjective/Objective:   History of COPD with recent admission for exacerbation, tobacco use, stage III CKD, anxiety, GERD, history of BPH, urinary retention with chronic indwelling Foley catheter.  Admitted for Sepsis.          Action/Plan: Patient with discharge orders home today.  Prior to admission patient lived at home with wife.  At discharge wife has requested patient discharge to Inpatient rehab facility.  Clinical Social worker consulted.    Expected Discharge Date:  01/18/18               Expected Discharge Plan:  Skilled Nursing Facility  In-House Referral:  Hospice / Palliative Care, Clinical Social Work  Discharge planning Services  CM Consult  Post Acute Care Choice:  IP Rehab Choice offered to:  Spouse  Status of Service:  In process, will continue to follow  Yancey Flemings, RN 01/18/2018, 12:13 PM
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
## 2018-01-18 NOTE — Progress Notes (Signed)
Patients wife called, she does not feel comfortable with patient coming home today and does not think this will be safe.  She discussed options with NP from palliative but needs more time "to get things in place"

## 2018-01-18 NOTE — Progress Notes (Signed)
PT Cancellation Note  Patient Details Name: David Duke MRN: [id number] DOB: 1935/06/06   Cancelled Treatment:    Reason Eval/Treat Not Completed: Patient declined, no reason specified   Attempted PT session, however David Duke is declining amb at this time;   Will continue to follow while in hospital;   Van Clines, PT  Acute Rehabilitation Services Pager [phone] Office [phone]    David Duke 01/18/2018, 3:56 PM

## 2018-01-18 NOTE — [place] FL2 (Signed)
[place] MEDICAID FL2 LEVEL OF CARE SCREENING TOOL     IDENTIFICATION  Patient Name: David Duke Birthdate: Nov 20, 1934 Sex: male Admission Date (Current Location): 01/15/2018  [facility] and [place] Number:  Producer, television/film/video and Address:  [facility], [address] [place], [place] [address]      Provider Number: [id number]  Attending Physician Name and Address:  Rolly Salter, MD  Relative Name and Phone Number:  Krikor Willet - wife; [phone] (mobile) and [phone] (home)    Current Level of Care: Hospital Recommended Level of Care: Skilled Nursing Facility Prior Approval Number:    Date Approved/Denied:   PASRR Number: [id number] A(Eff. 11/27/15)  Discharge Plan: SNF    Current Diagnoses: Patient Active Problem List   Diagnosis Date Noted  . Goals of care, counseling/discussion   . Palliative care by specialist   . Sepsis due to urinary tract infection (HCC) 01/15/2018  . CKD (chronic kidney disease) stage 3, GFR 30-59 ml/min (HCC) 12/22/2017  . Catheter-associated urinary tract infection (HCC) 12/22/2017  . COPD with acute exacerbation (HCC) 11/30/2017  . UTI (urinary tract infection) 11/23/2017  . HCAP (healthcare-associated pneumonia) 09/18/2017  . Sepsis (HCC)   . Acute respiratory failure with hypoxia (HCC) 09/07/2017  . Rhinitis, chronic 09/07/2017  . Malnutrition of moderate degree 09/07/2017  . Elevated troponin 03/27/2017  . Diplopia 03/27/2017  . Bilateral leg edema 03/27/2017  . Enterocolitis 09/30/2016  . HLD (hyperlipidemia) 09/30/2016  . Tobacco abuse 09/30/2016  . Nausea vomiting and diarrhea 09/30/2016  . History of stroke in prior 3 months 01/22/2016  . Chronic tension-type headache, not intractable   . Acute frontal sinusitis   . 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 Gait disturbance, post-stroke   . Ataxia, late effect of cerebrovascular disease   . BPH (benign  prostatic hyperplasia)   . Urinary retention   . Adjustment disorder with mixed anxiety and depressed mood   . Chronic obstructive pulmonary disease (HCC)   . Cephalalgia   . Acute kidney injury superimposed on chronic kidney disease (HCC)   . Macular degeneration   . Prediabetes   . Hypokalemia   . Absolute anemia   . Aphasia   . Acute encephalopathy 11/25/2015  . Hyponatremia 11/25/2015  . Acute kidney injury (HCC) 11/25/2015  . Depression   . Anxiety   . COPD (chronic obstructive pulmonary disease) (HCC)   . ICH (intracerebral hemorrhage) (HCC) 11/22/2015    Orientation RESPIRATION BLADDER Height & Weight     Self, Place  O2(3 Liters Oxygen) Continent, External catheter(Urethral catheter placed 12/23/17) Weight: 138 lb 7.2 oz (62.8 kg) Height:   (172.7 cm)  BEHAVIORAL SYMPTOMS/MOOD NEUROLOGICAL BOWEL NUTRITION STATUS      Continent Diet  AMBULATORY STATUS COMMUNICATION OF NEEDS Skin   Limited Assist Verbally Normal                       Personal Care Assistance Level of Assistance  Bathing, Feeding, Dressing Bathing Assistance: Limited assistance Feeding assistance: Independent Dressing Assistance: Limited assistance     Functional Limitations Info  Sight, Hearing, Speech Sight Info: (Blind right eye, macular degeneration both eyes) Hearing Info: Impaired Speech Info: Adequate    SPECIAL CARE FACTORS FREQUENCY  PT (By licensed PT), OT (By licensed OT)     PT Frequency: Evaluated 5/11 and a minimum of 3X per week therapy recommended during acute inpatient stay OT Frequency: Evaluated 5/12  and a minimum of 2X per week therapy recommended during acute inpatient stay            Contractures Contractures Info: Not present    Additional Factors Info  Code Status, Allergies Code Status Info: DNR Allergies Info: Penicillins, Amoxicillin, Sulfa antibiotics           Current Medications (01/18/2018):  This is the current hospital active medication  list Current Facility-Administered Medications  Medication Dose Route Frequency Provider Last Rate Last Dose  . acetaminophen (TYLENOL) tablet 650 mg  650 mg Oral Q6H PRN [name] E, PA-C       Or  . acetaminophen (TYLENOL) suppository 650 mg  650 mg Rectal Q6H PRN Marcos Eke, PA-C      . albuterol (PROVENTIL) (2.5 MG/3ML) 0.083% nebulizer solution 2.5 mg  2.5 mg Nebulization Q6H PRN Calvert Cantor, MD   2.5 mg at 01/18/18 0658  . busPIRone (BUSPAR) tablet 5 mg  5 mg Oral BID Rolly Salter, MD   5 mg at 01/18/18 0920  . diazepam (VALIUM) tablet 5 mg  5 mg Oral Q6H Rizwan, Ladell Heads, MD   5 mg at 01/18/18 0920  . finasteride (PROSCAR) tablet 5 mg  5 mg Oral Daily Marcos Eke, PA-C   5 mg at 01/18/18 0920  . fluticasone (FLONASE) 50 MCG/ACT nasal spray 1 spray  1 spray Each Nare Daily PRN Marcos Eke, PA-C      . guaiFENesin (MUCINEX) 12 hr tablet 1,200 mg  1,200 mg Oral BID Calvert Cantor, MD   1,200 mg at 01/18/18 0920  . haloperidol (HALDOL) tablet 2 mg  2 mg Oral Q8H PRN Rolly Salter, MD      . heparin injection 5,000 Units  5,000 Units Subcutaneous Q8H Marcos Eke, PA-C   5,000 Units at 01/18/18 0981  . HYDROcodone-acetaminophen (NORCO/VICODIN) 5-325 MG per tablet 1-2 tablet  1-2 tablet Oral Q4H PRN Marcos Eke, PA-C   1 tablet at 01/15/18 2203  . levofloxacin (LEVAQUIN) tablet 750 mg  750 mg Oral Q24H Rolly Salter, MD   750 mg at 01/18/18 1914  . multivitamin with minerals tablet 1 tablet  1 tablet Oral Daily Marcos Eke, PA-C   1 tablet at 01/18/18 0920  . nystatin (MYCOSTATIN) 100000 UNIT/ML suspension 500,000 Units  5 mL Mouth/Throat QID Calvert Cantor, MD   500,000 Units at 01/18/18 0920  . ondansetron (ZOFRAN) tablet 4 mg  4 mg Oral Q6H PRN Marcos Eke, PA-C       Or  . ondansetron (ZOFRAN) injection 4 mg  4 mg Intravenous Q6H PRN Marcos Eke, PA-C      . pantoprazole (PROTONIX) EC tablet 40 mg  40 mg Oral Daily Marcos Eke, PA-C   40 mg at  01/18/18 0920  . polyethylene glycol (MIRALAX / GLYCOLAX) packet 17 g  17 g Oral Daily PRN [name] E, PA-C      . polyvinyl alcohol (LIQUIFILM TEARS) 1.4 % ophthalmic solution 1 drop  1 drop Both Eyes PRN Gwynneth Munson, Sara E, PA-C      . senna-docusate (Senokot-S) tablet 1 tablet  1 tablet Oral QHS PRN Marcos Eke, PA-C   1 tablet at 01/16/18 1106     Discharge Medications: Please see discharge summary for a list of discharge medications.  Relevant Imaging Results:  Relevant Lab Results:   Additional Information ss#[id number].  Cristobal Goldmann, LCSW

## 2018-01-18 NOTE — Progress Notes (Signed)
Patient left floor with transport.

## 2018-01-18 NOTE — Clinical Social Work Placement (Signed)
   CLINICAL SOCIAL WORK PLACEMENT  NOTE 01/18/18 - DISCHARGED TO [facility] VIA AMBULANCE  Date:  01/18/2018  Patient Details  Name: David Duke MRN: [id number] Date of Birth: 03-21-1935  Clinical Social Work is seeking post-discharge placement for this patient at the Skilled  Nursing Facility level of care (*CSW will initial, date and re-position this form in  chart as items are completed):  No(Wife provided CSW with facility preference)   Patient/family provided with [facility] Clinical Social Work Department's list of facilities offering this level of care within the geographic area requested by the patient (or if unable, by the patient's family).  Yes   Patient/family informed of their freedom to choose among providers that offer the needed level of care, that participate in Medicare, Medicaid or managed care program needed by the patient, have an available bed and are willing to accept the patient.  No   Patient/family informed of [facility]'s ownership interest in [facility] and [facility], as well as of the fact that they are under no obligation to receive care at these facilities.  PASRR submitted to EDS on       PASRR number received on       Existing PASRR number confirmed on 01/18/18     FL2 transmitted to all facilities in geographic area requested by pt/family on 01/18/18     FL2 transmitted to all facilities within larger geographic area on       Patient informed that his/her managed care company has contracts with or will negotiate with certain facilities, including the following:        Yes   Patient/family informed of bed offers received.  Patient chooses bed at [facility]     Physician recommends and patient chooses bed at      Patient to be transferred to [facility] on 01/18/18.  Patient to be transferred to facility by  Ambulance     Patient and family notified on  01/18/18 of transfer.  Name of family  member notified:   [name] by phone - [phone].      PHYSICIAN       Additional Comment:  01/18/18 - [facility] received authorization from [facility] Medicare.   _______________________________________________ Cristobal Goldmann, LCSW 01/18/2018, 3:29 PM

## 2018-01-18 NOTE — Clinical Social Work Note (Signed)
Clinical Social Work Assessment  Patient Details  Name: OCTAVIA MOTTOLA MRN: [id number] Date of Birth: 1934/12/16  Date of referral:  01/18/18               Reason for consult:  Discharge Planning, Facility Placement                Permission sought to share information with:  Family Supports Permission granted to share information::  No(Patient oriented to person and place only)  Name::     Burt Ek  Agency::     Relationship::  Wife  Contact Information:  [phone] (mobile) and [phone] (home)  Housing/Transportation Living arrangements for the past 2 months:  Single Family Home Source of Information:  Spouse Patient Interpreter Needed:  None Criminal Activity/Legal Involvement Pertinent to Current Situation/Hospitalization:  No - Comment as needed Significant Relationships:  Spouse Lives with:  Spouse Do you feel safe going back to the place where you live?  No(Wife in agreement with patient going to rehab before returning home) Need for family participation in patient care:  Yes (Comment)  Care giving concerns:  Mrs. Neidig talked honestly regarding her caregiver fatigue and her husband needing more care than she can provide at this time.  Social Worker assessment / plan:  CSW talked with Mrs. Mattos by phone regarding discharge disposition and her request for SNF placement. Wife talked about patient's medical history, being in and out of the hospital and her being fatigued at this time with the care she provides. CSW expressed empathetic understanding to patient's feelings. Mrs. Vancuren explained that her husband just discharged from [facility] on 5/6 and she was satisfied with the cleanliness of the facility and the care he husband received there. When asked, wife requested that husband return to [facility] if they can accept him. CSW also talked with wife regarding possible co-pays at the skilled facility and applying for Medicaid also discussed  (community Medicaid vs. LTC Medicaid).  Employment status:  Retired Primary school teacher) PT Recommendations:  Skilled Nursing Facility Information / Referral to community resources:  Other (Comment Required)(Talked with wife regarding SNF's and wife indicated preference)  Patient/Family's Response to care: Mrs. Hamm expressed no concerns regarding her husband's care during hospitalization.  Patient/Family's Understanding of and Emotional Response to Diagnosis, Current Treatment, and Prognosis:  Mrs. Diloreto appeared to have an understanding of her husband's health issues and his need for additional rehab at this time.  Emotional Assessment Appearance:  Other (Comment Required(Did not visit with patient due to orientation status, talked with wife by phone) Attitude/Demeanor/Rapport:  Unable to Assess Affect (typically observed):  Unable to Assess Orientation:  Oriented to Self, Oriented to Place Alcohol / Substance use:  Tobacco Use, Alcohol Use, Illicit Drugs(Patient currently smokes and does not consume alcohol or use illicit drugs) Psych involvement (Current and /or in the community):  No (Comment)  Discharge Needs  Concerns to be addressed:  Discharge Planning Concerns Readmission within the last 30 days:  Yes Current discharge risk:  None Barriers to Discharge:  Insurance Authorization   Cristobal Goldmann, LCSW 01/18/2018, 1:48 PM

## 2018-01-18 NOTE — Progress Notes (Signed)
Attempted to call report x2. Number left with secretary at [facility], transport has been called.

## 2018-01-18 NOTE — Progress Notes (Signed)
Attempted to call report, spoke with Marjean Donna the secretary who was unable to get in touch with RN at the facility.

## 2018-01-20 LAB — CULTURE, BLOOD (ROUTINE X 2)
CULTURE: NO GROWTH
Culture: NO GROWTH
SPECIAL REQUESTS: ADEQUATE
SPECIAL REQUESTS: ADEQUATE

## 2018-01-22 ENCOUNTER — Encounter (HOSPITAL_COMMUNITY): Payer: Self-pay | Admitting: Emergency Medicine

## 2018-01-22 ENCOUNTER — Emergency Department (HOSPITAL_COMMUNITY)
Admission: EM | Admit: 2018-01-22 | Discharge: 2018-01-22 | Disposition: A | Discharge status: Home / Self Care | Payer: Medicare Other | Attending: Emergency Medicine | Admitting: Emergency Medicine

## 2018-01-22 DIAGNOSIS — N183 Chronic kidney disease, stage 3 (moderate): Secondary | ICD-10-CM | POA: Insufficient documentation

## 2018-01-22 DIAGNOSIS — J449 Chronic obstructive pulmonary disease, unspecified: Secondary | ICD-10-CM | POA: Insufficient documentation

## 2018-01-22 DIAGNOSIS — Y829 Unspecified medical devices associated with adverse incidents: Secondary | ICD-10-CM | POA: Insufficient documentation

## 2018-01-22 DIAGNOSIS — F0390 Unspecified dementia without behavioral disturbance: Secondary | ICD-10-CM | POA: Insufficient documentation

## 2018-01-22 DIAGNOSIS — Z79899 Other long term (current) drug therapy: Secondary | ICD-10-CM | POA: Insufficient documentation

## 2018-01-22 DIAGNOSIS — T83021A Displacement of indwelling urethral catheter, initial encounter: Secondary | ICD-10-CM | POA: Diagnosis not present

## 2018-01-22 DIAGNOSIS — Z87891 Personal history of nicotine dependence: Secondary | ICD-10-CM | POA: Diagnosis not present

## 2018-01-22 DIAGNOSIS — N401 Enlarged prostate with lower urinary tract symptoms: Secondary | ICD-10-CM | POA: Diagnosis not present

## 2018-01-22 DIAGNOSIS — Z8673 Personal history of transient ischemic attack (TIA), and cerebral infarction without residual deficits: Secondary | ICD-10-CM | POA: Diagnosis not present

## 2018-01-22 MED ORDER — LIDOCAINE HCL URETHRAL/MUCOSAL 2 % EX GEL
1.0000 "application " | Freq: Once | CUTANEOUS | Status: AC
  Administered 2018-01-22: 1 via URETHRAL
  Filled 2018-01-22: qty 5

## 2018-01-22 NOTE — ED Notes (Signed)
Tried to give report twice, transferred by front desk no answer.

## 2018-01-22 NOTE — ED Triage Notes (Signed)
Patient here from [facility] after removing his own urinary catheter last night at a unknown time. Patient's wife requested that he come to the ED for a workup instead of going to his urology appointment today. Patient does not have any complaints and has hx of dementia.

## 2018-01-22 NOTE — ED Notes (Signed)
Bed: [facility] Expected date:  Expected time:  Means of arrival:  Comments: EMS-catheter issues??

## 2018-01-22 NOTE — ED Provider Notes (Signed)
[facility]-EMERGENCY DEPT Provider Note   CSN: [id number] Arrival date & time: 01/22/18  1551     History   Chief Complaint Chief Complaint  Patient presents with  . urinary catheter issue    HPI David Duke is a 83 y.o. male.  83 yo M with a chief complaint of pulling out his Foley catheter.  The patient is demented and the wife thinks he did it by accident.  He has been unable to urinate since.  In and out cath attempted at his facility and were unable to pass the catheter.  He was then sent to the emergency department for evaluation.  The history is provided by the patient.  Illness  This is a new problem. The current episode started 2 days ago. The problem occurs constantly. The problem has not changed since onset.Pertinent negatives include no chest pain, no abdominal pain, no headaches and no shortness of breath. Nothing aggravates the symptoms. Nothing relieves the symptoms. He has tried nothing for the symptoms. The treatment provided no relief.    Past Medical History:  Diagnosis Date  . AAA (abdominal aortic aneurysm) (HCC)   . AKI (acute kidney injury) (HCC)   . Anxiety   . Blind right eye   . BPH (benign prostatic hypertrophy) with urinary retention   . COPD (chronic obstructive pulmonary disease) (HCC)   . Dementia   . Depression   . High cholesterol   . Macular degeneration of both eyes   . Renal disorder   . Shortness of breath dyspnea   . Stroke [employer]) 11/2015   has residual slurred speech    Patient Active Problem List   Diagnosis Date Noted  . Goals of care, counseling/discussion   . Palliative care by specialist   . Sepsis due to urinary tract infection (HCC) 01/15/2018  . CKD (chronic kidney disease) stage 3, GFR 30-59 ml/min (HCC) 12/22/2017  . Catheter-associated urinary tract infection (HCC) 12/22/2017  . COPD with acute exacerbation (HCC) 11/30/2017  . UTI (urinary tract infection) 11/23/2017  . HCAP  (healthcare-associated pneumonia) 09/18/2017  . Sepsis (HCC)   . Acute respiratory failure with hypoxia (HCC) 09/07/2017  . Rhinitis, chronic 09/07/2017  . Malnutrition of moderate degree 09/07/2017  . Elevated troponin 03/27/2017  . Diplopia 03/27/2017  . Bilateral leg edema 03/27/2017  . Enterocolitis 09/30/2016  . HLD (hyperlipidemia) 09/30/2016  . Tobacco abuse 09/30/2016  . Nausea vomiting and diarrhea 09/30/2016  . History of stroke in prior 3 months 01/22/2016  . Chronic tension-type headache, not intractable   . Acute frontal sinusitis   . 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 Gait disturbance, post-stroke   . Ataxia, late effect of cerebrovascular disease   . BPH (benign prostatic hyperplasia)   . Urinary retention   . Adjustment disorder with mixed anxiety and depressed mood   . Chronic obstructive pulmonary disease (HCC)   . Cephalalgia   . Acute kidney injury superimposed on chronic kidney disease (HCC)   . Macular degeneration   . Prediabetes   . Hypokalemia   . Absolute anemia   . Aphasia   . Acute encephalopathy 11/25/2015  . Hyponatremia 11/25/2015  . Acute kidney injury (HCC) 11/25/2015  . Depression   . Anxiety   . COPD (chronic obstructive pulmonary disease) (HCC)   . ICH (intracerebral hemorrhage) (HCC) 11/22/2015    Past Surgical History:  Procedure Laterality Date  . NO PAST SURGERIES  Home Medications    Prior to Admission medications   Medication Sig Start Date End Date Taking? Authorizing Provider  busPIRone (BUSPAR) 5 MG tablet Take 5 mg by mouth 2 (two) times daily.   Yes [provider]  diazepam (VALIUM) 10 MG tablet Take 10 mg by mouth 3 (three) times daily.   Yes [provider]  finasteride (PROSCAR) 5 MG tablet Take 5 mg by mouth daily. 09/22/16  Yes [provider]  fluticasone (FLONASE) 50 MCG/ACT nasal spray Place 1 spray into both nostrils daily. 12/11/15  Yes Love,  [name], PA-C  guaiFENesin (MUCINEX) 600 MG 12 hr tablet Take 2 tablets (1,200 mg total) by mouth 2 (two) times daily. 09/11/17  Yes [name], MD  lansoprazole (PREVACID) 15 MG capsule Take 15 mg by mouth daily after breakfast.    Yes [provider]  levofloxacin (LEVAQUIN) 750 MG tablet Take 1 tablet (750 mg total) by mouth daily. 01/18/18  Yes Rolly Salter, MD  LORazepam (ATIVAN) 1 MG tablet Take 1 mg by mouth daily as needed for anxiety.   Yes [provider]  Multiple Vitamin (MULTIVITAMIN WITH MINERALS) TABS tablet Take 1 tablet by mouth daily.   Yes [provider]  nicotine (NICODERM CQ - DOSED IN MG/24 HOURS) 14 mg/24hr patch Place 1 patch (14 mg total) onto the skin daily. 12/04/17  Yes [name], MD  nystatin (MYCOSTATIN) 100000 UNIT/ML suspension Use as directed 5 mLs (500,000 Units total) in the mouth or throat 4 (four) times daily. 01/18/18  Yes Rolly Salter, MD  OXYGEN Inhale 2 L into the lungs continuous.   Yes [provider]  polyethylene glycol (MIRALAX / GLYCOLAX) packet Take 17 g by mouth daily. 09/22/17  Yes [name], MD  sennosides-docusate sodium (SENOKOT-S) 8.6-50 MG tablet Take 1 tablet by mouth at bedtime.   Yes [provider]  acetaminophen (TYLENOL) 500 MG tablet Take 500 mg by mouth every 6 (six) hours as needed for mild pain, moderate pain or headache.    [provider]  albuterol (PROVENTIL HFA;VENTOLIN HFA) 108 (90 Base) MCG/ACT inhaler Inhale 1-2 puffs into the lungs every 6 (six) hours as needed for wheezing or shortness of breath. 12/28/16   Long, Arlyss Repress, MD  albuterol (PROVENTIL) (2.5 MG/3ML) 0.083% nebulizer solution Take 2.5 mg by nebulization every 8 (eight) hours as needed for wheezing or shortness of breath.    [provider]  diazepam (VALIUM) 5 MG tablet Take 1 tablet (5 mg total) by mouth every 6 (six) hours. Patient not taking: Reported on 01/22/2018 12/25/17   [name], MD  haloperidol (HALDOL) 2 MG tablet Take 1 tablet (2 mg total) by mouth every 8 (eight) hours as needed for agitation. 01/18/18   Rolly Salter, MD  ipratropium-albuterol (DUONEB) 0.5-2.5 (3) MG/3ML SOLN Take 3 mLs by nebulization every 6 (six) hours as needed. Patient taking differently: Take 3 mLs by nebulization every 6 (six) hours as needed (sob and wheezing).  12/03/17   [name], MD  nystatin (MYCOSTATIN/NYSTOP) powder Apply 1 g topically 2 (two) times daily as needed (rash).     [provider]  ondansetron (ZOFRAN) 4 MG tablet Take 4 mg by mouth every 6 (six) hours as needed for nausea or vomiting.     [provider]  Polyethyl Glycol-Propyl Glycol (SYSTANE) 0.4-0.3 % SOLN Apply 1 drop to eye every 8 (eight) hours as needed (dry eyes).  [provider]    Family History Family History  Problem Relation Age of Onset  . Colon cancer Mother   . Cerebral aneurysm Father     Social History Social History   Tobacco Use  . Smoking status: Former Smoker    Packs/day: 0.25    Years: 60.00    Pack years: 15.00    Types: Cigarettes    Last attempt to quit: 11/22/2015    Years since quitting: 2.1  . Smokeless tobacco: Never Used  Substance Use Topics  . Alcohol use: No  . Drug use: No     Allergies   Penicillins; Amoxicillin; and Sulfa antibiotics   Review of Systems Review of Systems  Constitutional: Negative for chills and fever.  HENT: Negative for congestion and facial swelling.   Eyes: Negative for discharge and visual disturbance.  Respiratory: Negative for shortness of breath.   Cardiovascular: Negative for chest pain and palpitations.  Gastrointestinal: Negative for abdominal pain, diarrhea and vomiting.  Genitourinary: Positive for difficulty urinating.  Musculoskeletal: Negative for arthralgias and myalgias.  Skin: Negative for color change and rash.  Neurological: Negative for tremors, syncope and headaches.    Psychiatric/Behavioral: Negative for confusion and dysphoric mood.     Physical Exam Updated Vital Signs BP (!) 146/78 (BP Location: Right Arm)   Pulse 76   Resp 19   SpO2 95%   Physical Exam  Constitutional: He is oriented to person, place, and time. He appears well-developed and well-nourished.  HENT:  Head: Normocephalic and atraumatic.  Eyes: Pupils are equal, round, and reactive to light. EOM are normal.  Neck: Normal range of motion. Neck supple. No JVD present.  Cardiovascular: Normal rate and regular rhythm. Exam reveals no gallop and no friction rub.  No murmur heard. Pulmonary/Chest: No respiratory distress. He has no wheezes.  Abdominal: He exhibits no distension and no mass. There is tenderness (suprapubic fullness). There is no rebound and no guarding.  Musculoskeletal: Normal range of motion.  Neurological: He is alert and oriented to person, place, and time.  Skin: No rash noted. No pallor.  Psychiatric: He has a normal mood and affect. His behavior is normal.  Nursing note and vitals reviewed.    ED Treatments / Results  Labs (all labs ordered are listed, but only abnormal results are displayed) Labs Reviewed  URINE CULTURE    EKG None  Radiology No results found.  Procedures Procedures (including critical care time)  Medications Ordered in ED Medications  lidocaine (XYLOCAINE) 2 % jelly 1 application (1 application Urethral Given 01/22/18 1643)     Initial Impression / Assessment and Plan / ED Course  I have reviewed the triage vital signs and the nursing notes.  Pertinent labs & imaging results that were available during my care of the patient were reviewed by me and considered in my medical decision making (see chart for details).     83 yo M with a cc of pulled out foley cath.  Unable to urinate, will replace here.   Foley placed by nursing.  Patient having yellow nonbloody urine draining.  He feels much better.  Suprapubic tenderness  has resolved on my repeat exam.  PCP, urology follow-up.  5:13 PM:  I have discussed the diagnosis/risks/treatment options with the patient and family and believe the pt to be eligible for discharge home to follow-up with PCP. We also discussed returning to the ED immediately if new or worsening sx occur. We discussed the sx which are most  concerning (e.g., sudden worsening pain, fever, inability to tolerate by mouth) that necessitate immediate return. Medications administered to the patient during their visit and any new prescriptions provided to the patient are listed below.  Medications given during this visit Medications  lidocaine (XYLOCAINE) 2 % jelly 1 application (1 application Urethral Given 01/22/18 1643)      The patient appears reasonably screen and/or stabilized for discharge and I doubt any other medical condition or other [facility] requiring further screening, evaluation, or treatment in the ED at this time prior to discharge.    Final Clinical Impressions(s) / ED Diagnoses   Final diagnoses:  Dislodged Foley catheter, initial encounter [facility])    ED Discharge Orders    None       Melene Plan, DO 01/22/18 1713

## 2018-01-22 NOTE — Discharge Instructions (Signed)
Follow-up with your urologist.  

## 2018-01-24 LAB — URINE CULTURE: Culture: NO GROWTH

## 2018-02-02 ENCOUNTER — Non-Acute Institutional Stay: Payer: Medicare Other | Admitting: Hospice and Palliative Medicine

## 2018-02-02 DIAGNOSIS — Z515 Encounter for palliative care: Secondary | ICD-10-CM

## 2018-02-02 NOTE — Progress Notes (Signed)
PALLIATIVE CARE CONSULT VISIT   PATIENT NAME: David Duke DOB: 28-Aug-1935 MRN: [id number]  PRIMARY CARE PROVIDER:   [name], MD  REFERRING PROVIDER:     Dr. Nelson Chimes  RESPONSIBLE PARTY:   Simcha Speir - Spouse - [phone]  ASSESSMENT:     Patient is confused and unable to engage in conversations about goals of care. Staff say that he has been intermittently refusing care including therapy, medications, and food. Will call patient's wife.    RECOMMENDATIONS and PLAN:  1. Continue rehab 2. DNR 3. Will need continued palliative care support while at facility. May benefit from hospice care but disposition is unclear.   I spent 30 minutes providing this consultation,  from 1030 to 1100. More than 50% of the time in this consultation was spent coordinating communication.   HISTORY OF PRESENT ILLNESS:  TAEVEON Duke is a 83 y.o. year old male with multiple medical problems including vascular dementia, COPD, CKD III, who was hospitalized 01/15/18 to 01/18/18 with sepsis from UTI. He was seen in consultation by palliative care while in the hospital and we have been asked to follow in the facility to help continue conversations regarding goals of care.   CODE STATUS: DNR  PPS: 0% HOSPICE ELIGIBILITY/DIAGNOSIS: TBD  PAST MEDICAL HISTORY:  Past Medical History:  Diagnosis Date  . AAA (abdominal aortic aneurysm) (HCC)   . AKI (acute kidney injury) (HCC)   . Anxiety   . Blind right eye   . BPH (benign prostatic hypertrophy) with urinary retention   . COPD (chronic obstructive pulmonary disease) (HCC)   . Dementia   . Depression   . High cholesterol   . Macular degeneration of both eyes   . Renal disorder   . Shortness of breath dyspnea   . Stroke [facility]) 11/2015   has residual slurred speech    SOCIAL HX:  Social History   Tobacco Use  . Smoking status: Former Smoker    Packs/day: 0.25    Years: 60.00    Pack years: 15.00    Types: Cigarettes    Last attempt  to quit: 11/22/2015    Years since quitting: 2.2  . Smokeless tobacco: Never Used  Substance Use Topics  . Alcohol use: No    ALLERGIES:  Allergies  Allergen Reactions  . Penicillins Hives, Diarrhea and Nausea And Vomiting    Has patient had a PCN reaction causing immediate rash, facial/tongue/throat swelling, SOB or lightheadedness with hypotension: Yes Has patient had a PCN reaction causing severe rash involving mucus membranes or skin necrosis: No Has patient had a PCN reaction that required hospitalization: No Has patient had a PCN reaction occurring within the last 10 years: No If all of the above answers are "NO", then may proceed with Cephalosporin use.   [name] Amoxicillin Hives, Diarrhea and Nausea And Vomiting  . Sulfa Antibiotics Nausea Only     PERTINENT MEDICATIONS:  Outpatient Encounter Medications as of 02/02/2018  Medication Sig  . acetaminophen (TYLENOL) 500 MG tablet Take 500 mg by mouth every 6 (six) hours as needed for mild pain, moderate pain or headache.  . albuterol (PROVENTIL HFA;VENTOLIN HFA) 108 (90 Base) MCG/ACT inhaler Inhale 1-2 puffs into the lungs every 6 (six) hours as needed for wheezing or shortness of breath.  [name] albuterol (PROVENTIL) (2.5 MG/3ML) 0.083% nebulizer solution Take 2.5 mg by nebulization every 8 (eight) hours as needed for wheezing or shortness of breath.  . busPIRone (BUSPAR) 5 MG tablet Take  5 mg by mouth 2 (two) times daily.  . diazepam (VALIUM) 10 MG tablet Take 10 mg by mouth 3 (three) times daily.  . diazepam (VALIUM) 5 MG tablet Take 1 tablet (5 mg total) by mouth every 6 (six) hours. (Patient not taking: Reported on 01/22/2018)  . finasteride (PROSCAR) 5 MG tablet Take 5 mg by mouth daily.  . fluticasone (FLONASE) 50 MCG/ACT nasal spray Place 1 spray into both nostrils daily.  [name] guaiFENesin (MUCINEX) 600 MG 12 hr tablet Take 2 tablets (1,200 mg total) by mouth 2 (two) times daily.  . haloperidol (HALDOL) 2 MG tablet Take 1 tablet (2 mg  total) by mouth every 8 (eight) hours as needed for agitation.  [name] ipratropium-albuterol (DUONEB) 0.5-2.5 (3) MG/3ML SOLN Take 3 mLs by nebulization every 6 (six) hours as needed. (Patient taking differently: Take 3 mLs by nebulization every 6 (six) hours as needed (sob and wheezing). )  . lansoprazole (PREVACID) 15 MG capsule Take 15 mg by mouth daily after breakfast.   . levofloxacin (LEVAQUIN) 750 MG tablet Take 1 tablet (750 mg total) by mouth daily.  [name] LORazepam (ATIVAN) 1 MG tablet Take 1 mg by mouth daily as needed for anxiety.  . Multiple Vitamin (MULTIVITAMIN WITH MINERALS) TABS tablet Take 1 tablet by mouth daily.  . nicotine (NICODERM CQ - DOSED IN MG/24 HOURS) 14 mg/24hr patch Place 1 patch (14 mg total) onto the skin daily.  [name] nystatin (MYCOSTATIN) 100000 UNIT/ML suspension Use as directed 5 mLs (500,000 Units total) in the mouth or throat 4 (four) times daily.  [name] nystatin (MYCOSTATIN/NYSTOP) powder Apply 1 g topically 2 (two) times daily as needed (rash).   . ondansetron (ZOFRAN) 4 MG tablet Take 4 mg by mouth every 6 (six) hours as needed for nausea or vomiting.   . OXYGEN Inhale 2 L into the lungs continuous.  Bertram Gala Glycol-Propyl Glycol (SYSTANE) 0.4-0.3 % SOLN Apply 1 drop to eye every 8 (eight) hours as needed (dry eyes).   . polyethylene glycol (MIRALAX / GLYCOLAX) packet Take 17 g by mouth daily.  . sennosides-docusate sodium (SENOKOT-S) 8.6-50 MG tablet Take 1 tablet by mouth at bedtime.   No facility-administered encounter medications on file as of 02/02/2018.     PHYSICAL EXAM:   General: NAD, frail appearing, thin, lying in bed Cardiovascular: regular rate and rhythm Pulmonary: clear ant fields Abdomen: soft, nontender, + bowel sounds GU: no suprapubic tenderness, foley noted Extremities: no edema, no joint deformities Skin: no rashes Neurological: Weakness, wakes with stimulation, mumbles speech and shakes head in response to questions, confused  Malachy Moan, NP

## 2018-04-08 DEATH — deceased

## 2018-07-25 IMAGING — CR DG CHEST 2V
2 series · 2 of 2 positions shown · non-contrast
Comparison: Radiographs November 28, 2015.

CLINICAL DATA: Cough, wheezing.

EXAM:
CHEST  2 VIEW

[w chest pa]
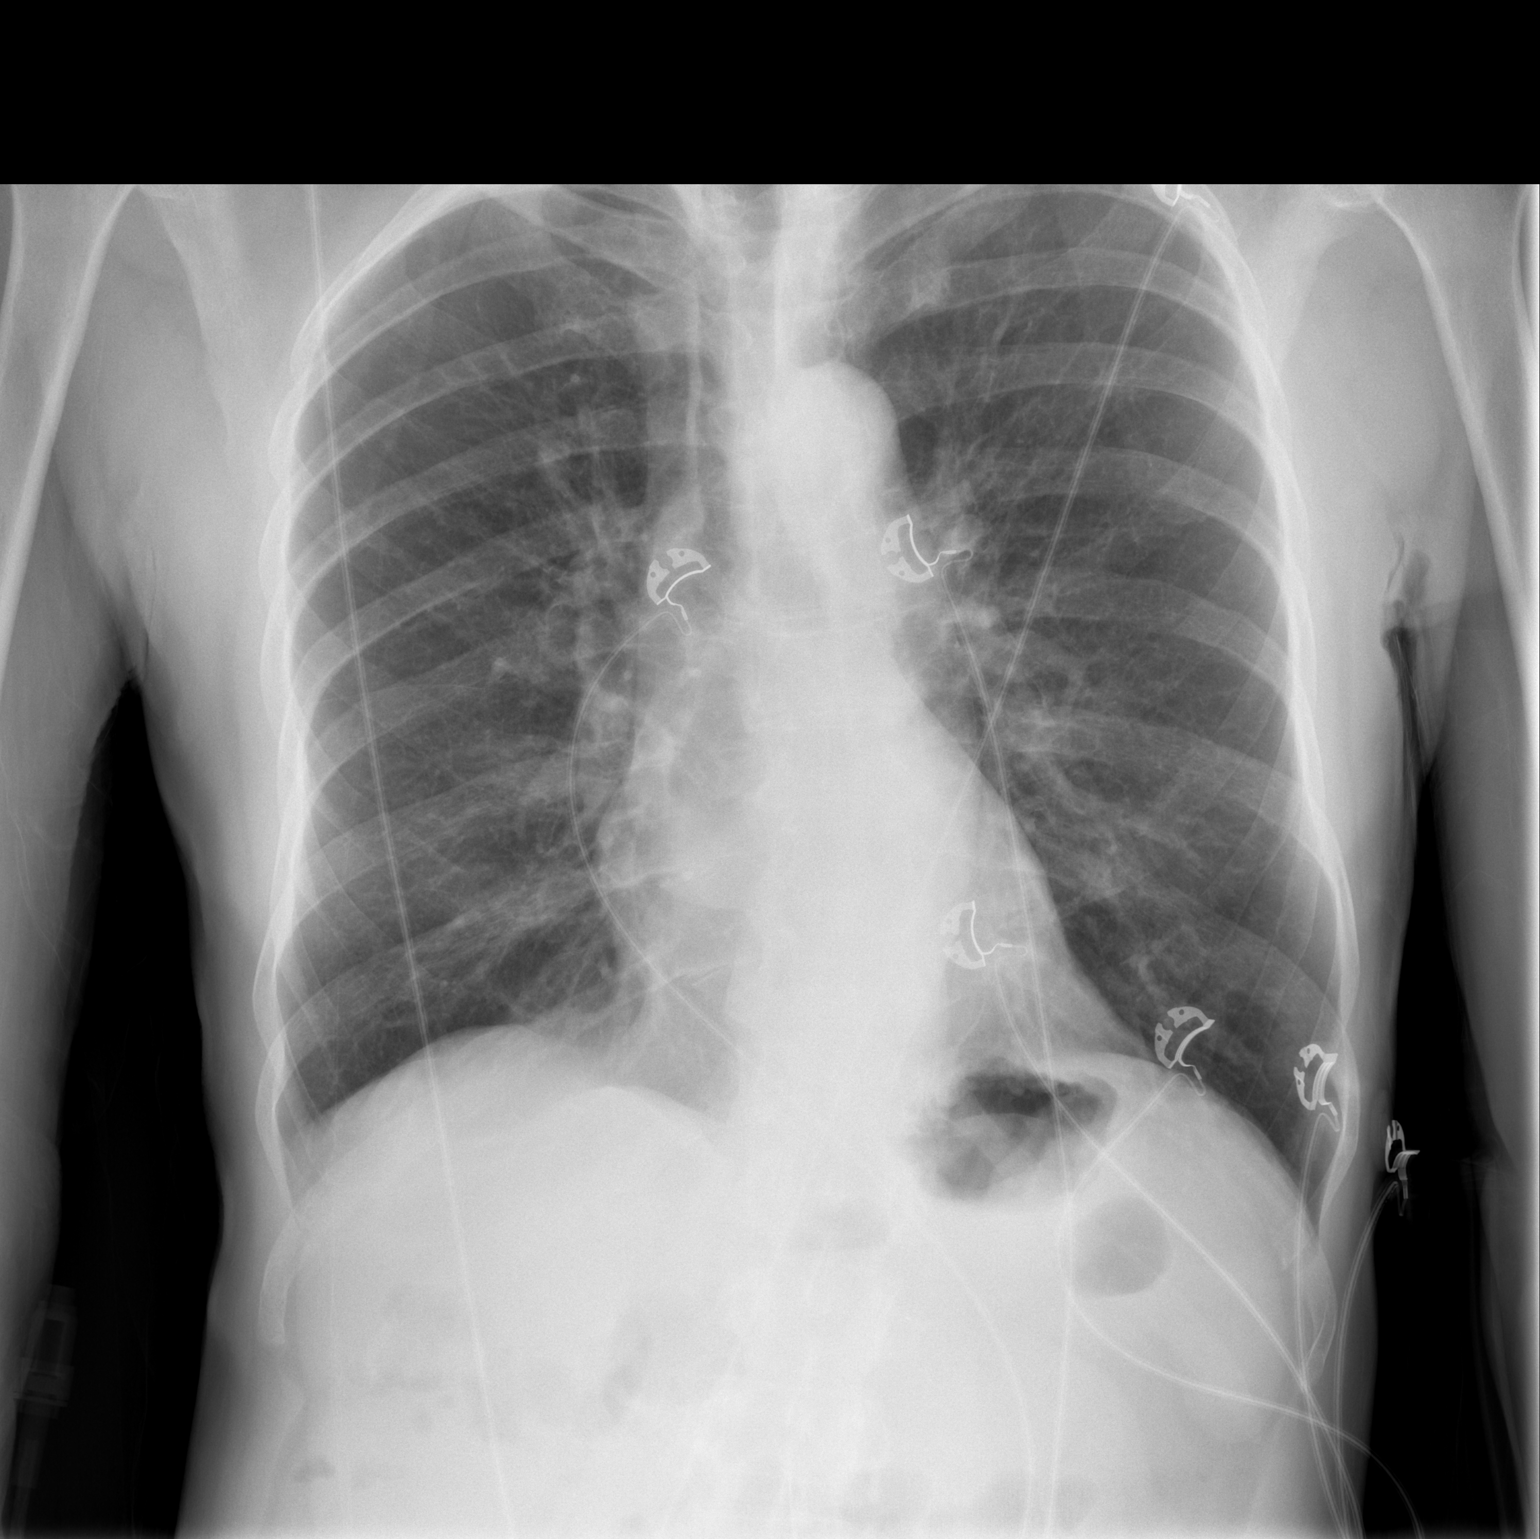

[w chest lat]
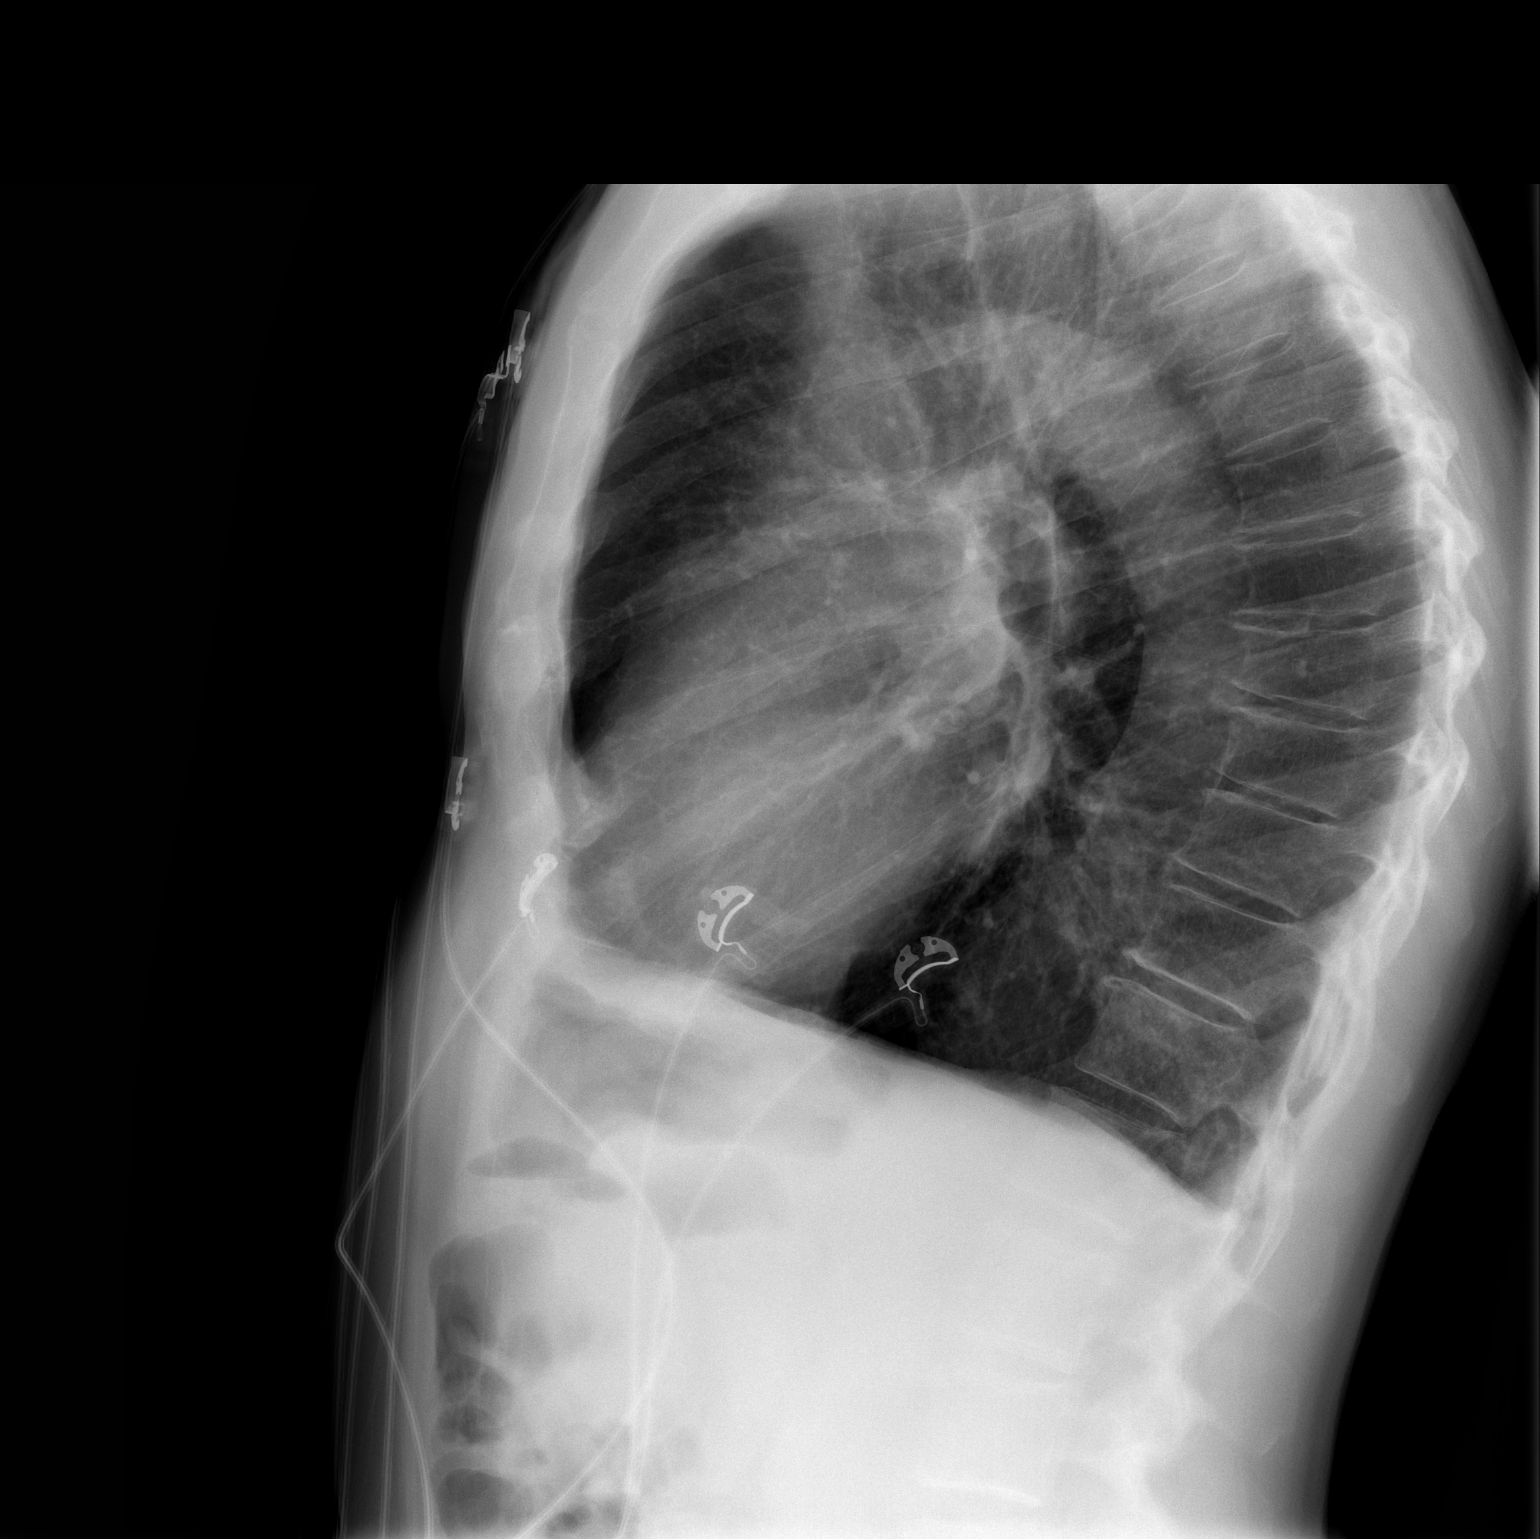

[2 of 2 positions shown; findings below may reference images not displayed]

FINDINGS: The heart size and mediastinal contours are within normal limits.
Both lungs are clear. No pneumothorax or pleural effusion is noted.
The visualized skeletal structures are unremarkable.
IMPRESSION: No active cardiopulmonary disease.

## 2019-04-15 IMAGING — DX DG CHEST 2V
2 series · 2 of 2 positions shown · non-contrast
Comparison: 09/29/2016

CLINICAL DATA: Shortness of breath and congestion.

EXAM:
CHEST  2 VIEW

[chest lat]
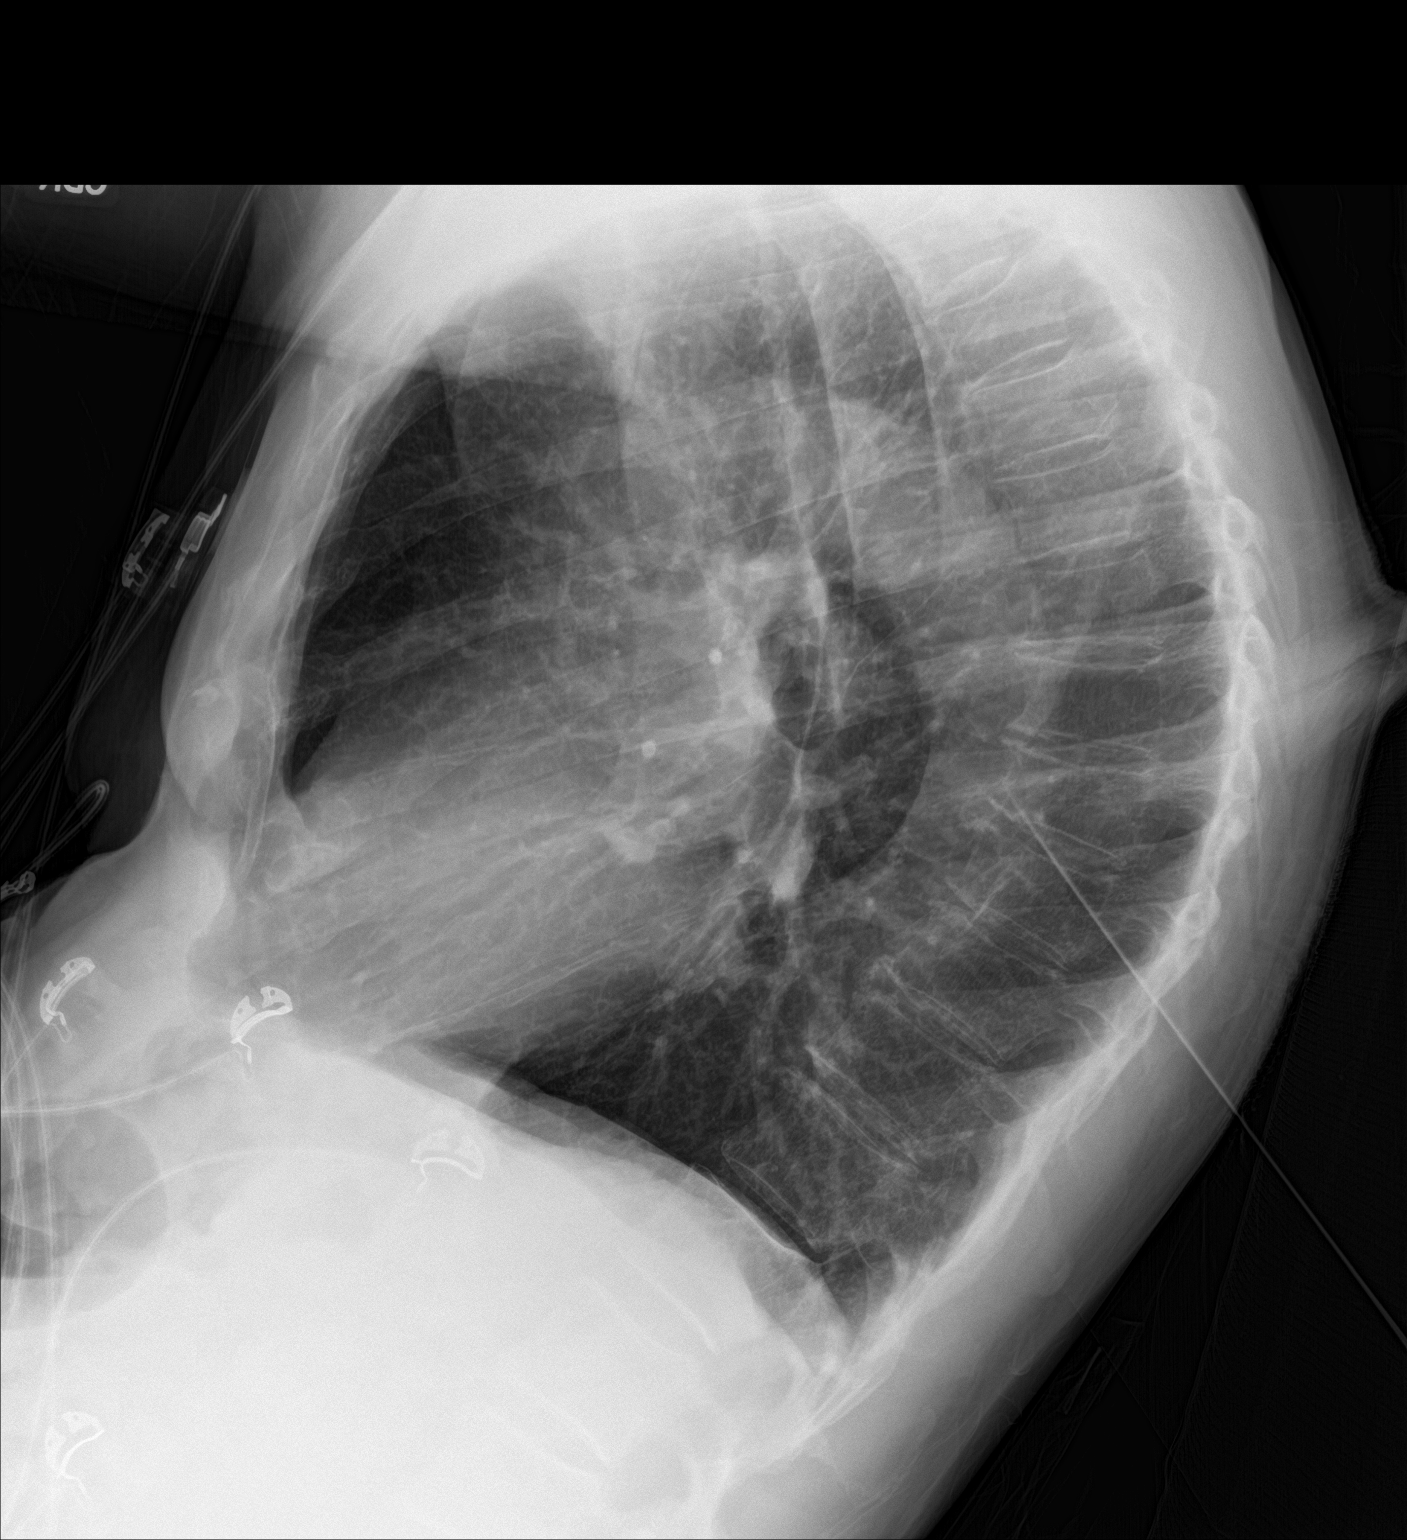

[chest ap strecther]
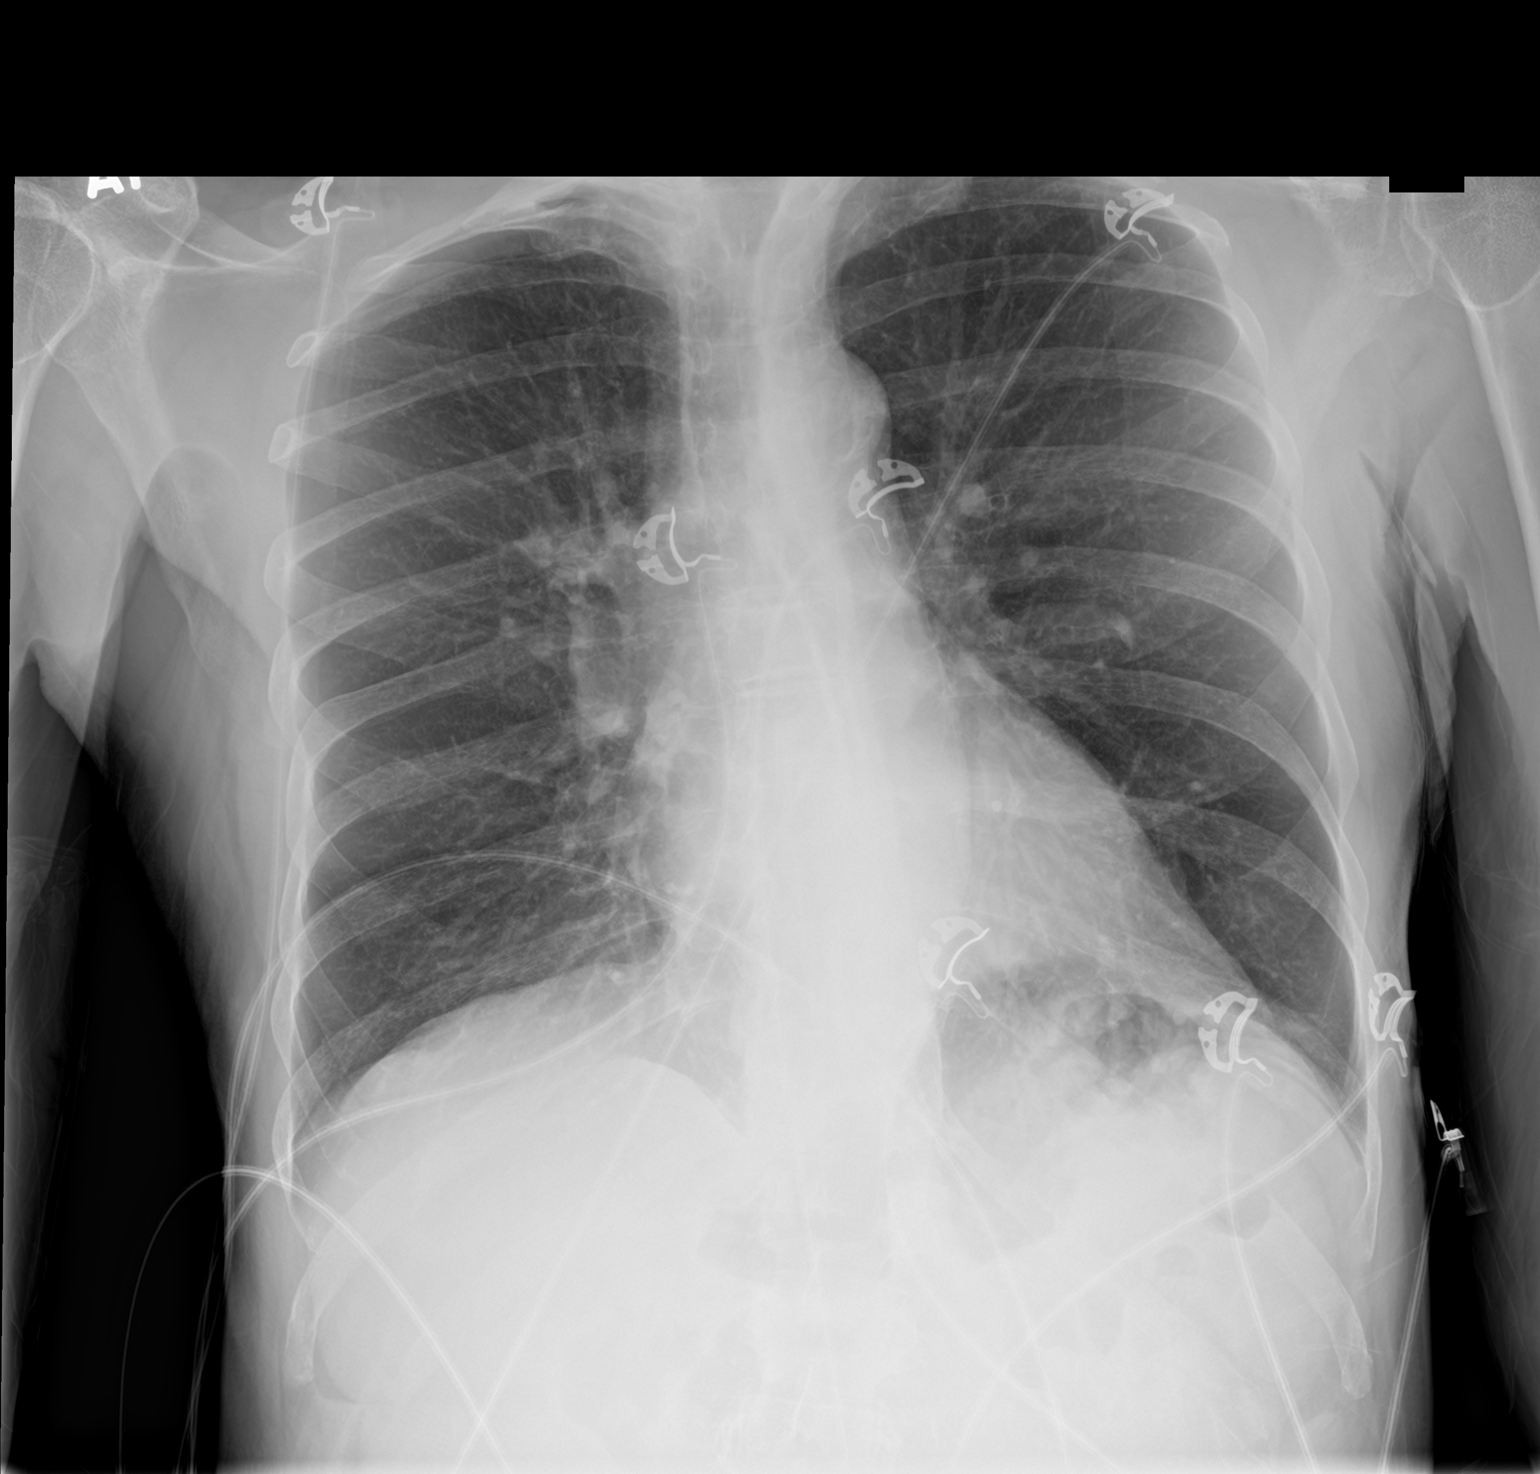

[2 of 2 positions shown; findings below may reference images not displayed]

FINDINGS: AP lordotic and lateral views of the chest show hyperexpansion
without edema or focal airspace consolidation. No pleural effusion.
The cardiopericardial silhouette is within normal limits for size.
The visualized bony structures of the thorax are intact. Telemetry
leads overlie the chest.
IMPRESSION: Emphysema.  No acute findings.

## 2019-05-27 IMAGING — CR DG CHEST 2V
2 series · 2 of 2 positions shown · non-contrast
Comparison: 12/28/2016

CLINICAL DATA: Cough, COPD

EXAM:
CHEST  2 VIEW

[w chest pa]
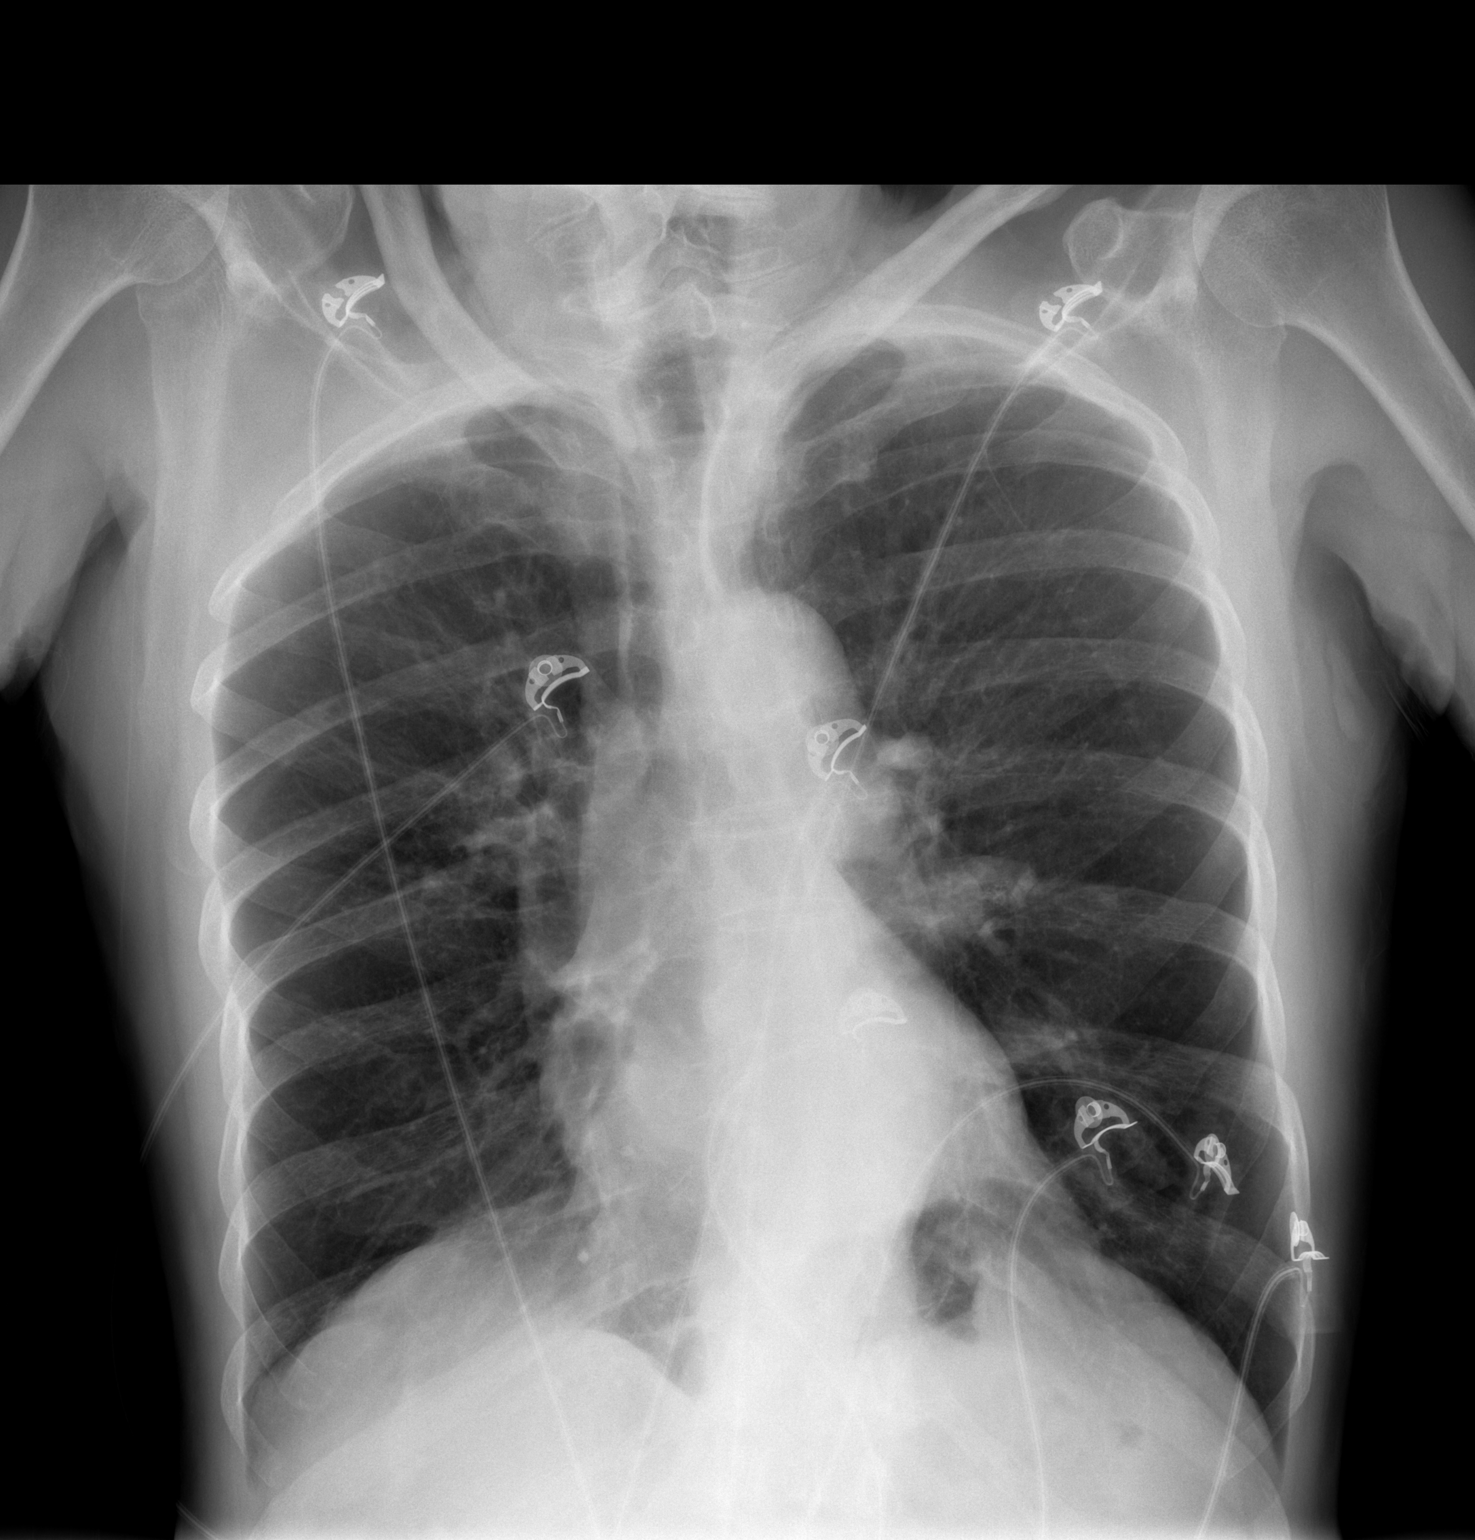

[w chest lat]
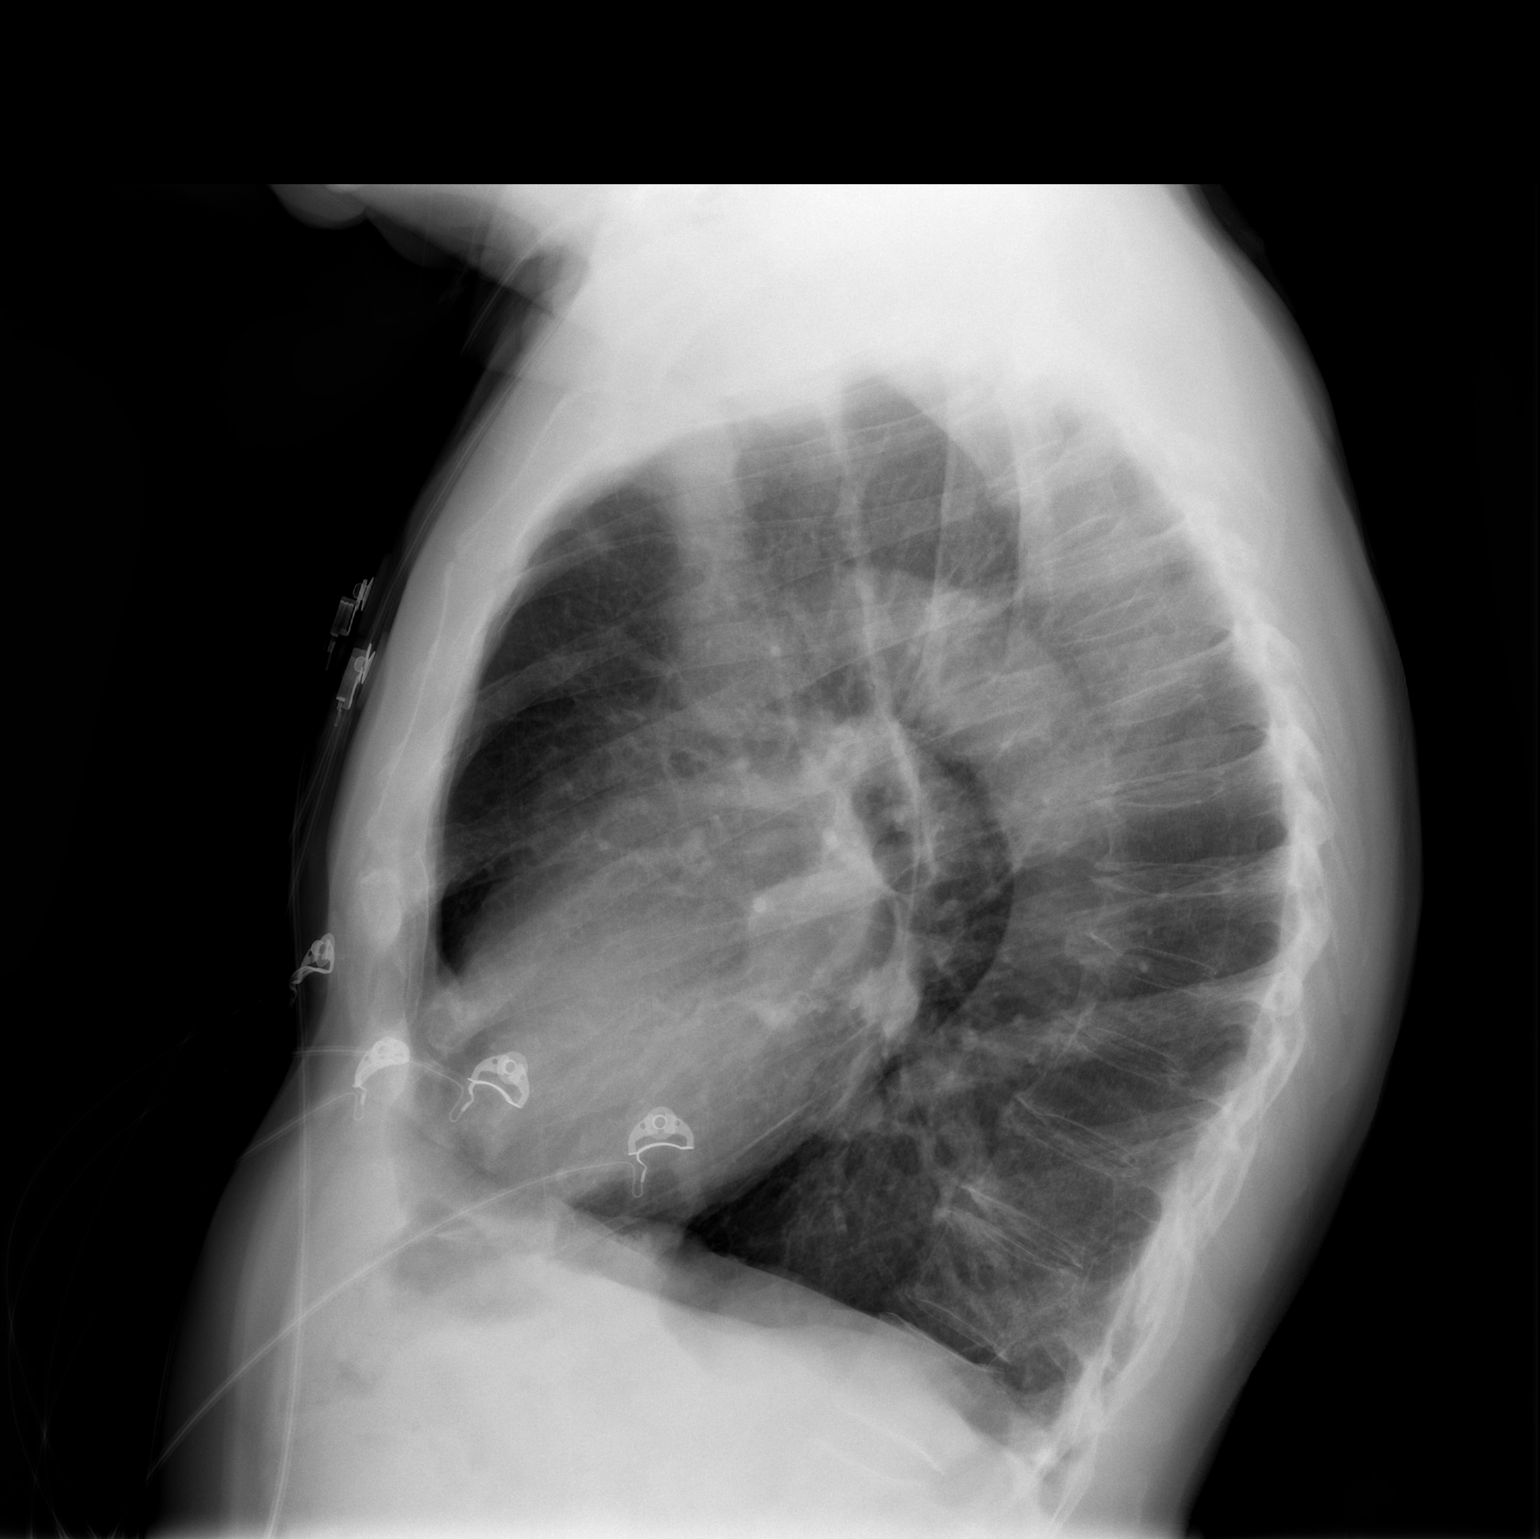

[2 of 2 positions shown; findings below may reference images not displayed]

FINDINGS: There is hyperinflation of the lungs compatible with COPD. Heart and
mediastinal contours are within normal limits. No focal opacities or
effusions. No acute bony abnormality.
IMPRESSION: COPD.  No active disease.
